# Patient Record
Sex: Female | Born: 1950
Health system: Southern US, Community
[De-identification: ages and names within clinical notes are randomized; demographics above are authoritative.]

## PROBLEM LIST (undated history)

## (undated) DIAGNOSIS — R51 Headache: Secondary | ICD-10-CM

## (undated) DIAGNOSIS — M1990 Unspecified osteoarthritis, unspecified site: Secondary | ICD-10-CM

## (undated) DIAGNOSIS — G4730 Sleep apnea, unspecified: Secondary | ICD-10-CM

## (undated) DIAGNOSIS — N859 Noninflammatory disorder of uterus, unspecified: Secondary | ICD-10-CM

## (undated) DIAGNOSIS — T7840XA Allergy, unspecified, initial encounter: Secondary | ICD-10-CM

## (undated) DIAGNOSIS — K219 Gastro-esophageal reflux disease without esophagitis: Secondary | ICD-10-CM

## (undated) DIAGNOSIS — K859 Acute pancreatitis without necrosis or infection, unspecified: Secondary | ICD-10-CM

## (undated) DIAGNOSIS — E538 Deficiency of other specified B group vitamins: Secondary | ICD-10-CM

## (undated) DIAGNOSIS — R9431 Abnormal electrocardiogram [ECG] [EKG]: Secondary | ICD-10-CM

## (undated) DIAGNOSIS — R519 Headache, unspecified: Secondary | ICD-10-CM

## (undated) DIAGNOSIS — D649 Anemia, unspecified: Secondary | ICD-10-CM

## (undated) DIAGNOSIS — I10 Essential (primary) hypertension: Secondary | ICD-10-CM

## (undated) DIAGNOSIS — C8590 Non-Hodgkin lymphoma, unspecified, unspecified site: Secondary | ICD-10-CM

## (undated) DIAGNOSIS — G629 Polyneuropathy, unspecified: Secondary | ICD-10-CM

## (undated) DIAGNOSIS — C801 Malignant (primary) neoplasm, unspecified: Secondary | ICD-10-CM

## (undated) DIAGNOSIS — N949 Unspecified condition associated with female genital organs and menstrual cycle: Secondary | ICD-10-CM

## (undated) DIAGNOSIS — F419 Anxiety disorder, unspecified: Secondary | ICD-10-CM

## (undated) DIAGNOSIS — E1140 Type 2 diabetes mellitus with diabetic neuropathy, unspecified: Secondary | ICD-10-CM

## (undated) DIAGNOSIS — E119 Type 2 diabetes mellitus without complications: Secondary | ICD-10-CM

## (undated) HISTORY — DX: Malignant (primary) neoplasm, unspecified: C80.1

## (undated) HISTORY — DX: Type 2 diabetes mellitus with diabetic neuropathy, unspecified: E11.40

## (undated) HISTORY — DX: Unspecified osteoarthritis, unspecified site: M19.90

## (undated) HISTORY — DX: Anxiety disorder, unspecified: F41.9

## (undated) HISTORY — DX: Allergy, unspecified, initial encounter: T78.40XA

## (undated) HISTORY — DX: Acute pancreatitis without necrosis or infection, unspecified: K85.90

## (undated) HISTORY — DX: Essential (primary) hypertension: I10

## (undated) HISTORY — PX: MEDIPORT INSERTION, DOUBLE: SHX2026

## (undated) HISTORY — PX: OTHER SURGICAL HISTORY: SHX169

## (undated) HISTORY — PX: MEDIPORT REMOVAL: SHX2028

---

## 1996-03-06 HISTORY — PX: CHOLECYSTECTOMY: SHX55

## 2013-09-29 LAB — HM COLONOSCOPY

## 2015-11-29 DIAGNOSIS — Z9221 Personal history of antineoplastic chemotherapy: Secondary | ICD-10-CM | POA: Diagnosis not present

## 2015-11-29 DIAGNOSIS — Z8572 Personal history of non-Hodgkin lymphomas: Secondary | ICD-10-CM | POA: Diagnosis not present

## 2016-01-03 DIAGNOSIS — C8338 Diffuse large B-cell lymphoma, lymph nodes of multiple sites: Secondary | ICD-10-CM | POA: Diagnosis not present

## 2016-01-03 DIAGNOSIS — Z1231 Encounter for screening mammogram for malignant neoplasm of breast: Secondary | ICD-10-CM | POA: Diagnosis not present

## 2016-02-07 DIAGNOSIS — Z452 Encounter for adjustment and management of vascular access device: Secondary | ICD-10-CM | POA: Diagnosis not present

## 2016-02-07 DIAGNOSIS — C8338 Diffuse large B-cell lymphoma, lymph nodes of multiple sites: Secondary | ICD-10-CM | POA: Diagnosis not present

## 2016-03-13 DIAGNOSIS — Z452 Encounter for adjustment and management of vascular access device: Secondary | ICD-10-CM | POA: Diagnosis not present

## 2016-03-13 DIAGNOSIS — C8338 Diffuse large B-cell lymphoma, lymph nodes of multiple sites: Secondary | ICD-10-CM | POA: Diagnosis not present

## 2016-04-27 DIAGNOSIS — R591 Generalized enlarged lymph nodes: Secondary | ICD-10-CM | POA: Diagnosis not present

## 2016-04-27 DIAGNOSIS — R911 Solitary pulmonary nodule: Secondary | ICD-10-CM | POA: Diagnosis not present

## 2016-04-27 DIAGNOSIS — C8330 Diffuse large B-cell lymphoma, unspecified site: Secondary | ICD-10-CM | POA: Diagnosis not present

## 2016-04-27 DIAGNOSIS — M899 Disorder of bone, unspecified: Secondary | ICD-10-CM | POA: Diagnosis not present

## 2016-04-30 DIAGNOSIS — C8339 Diffuse large B-cell lymphoma, extranodal and solid organ sites: Secondary | ICD-10-CM | POA: Diagnosis not present

## 2016-04-30 DIAGNOSIS — G608 Other hereditary and idiopathic neuropathies: Secondary | ICD-10-CM | POA: Diagnosis not present

## 2016-04-30 DIAGNOSIS — E1142 Type 2 diabetes mellitus with diabetic polyneuropathy: Secondary | ICD-10-CM | POA: Diagnosis not present

## 2016-04-30 DIAGNOSIS — I10 Essential (primary) hypertension: Secondary | ICD-10-CM | POA: Diagnosis not present

## 2016-05-04 DIAGNOSIS — E119 Type 2 diabetes mellitus without complications: Secondary | ICD-10-CM | POA: Diagnosis not present

## 2016-05-04 DIAGNOSIS — G629 Polyneuropathy, unspecified: Secondary | ICD-10-CM | POA: Diagnosis not present

## 2016-05-04 DIAGNOSIS — Z8572 Personal history of non-Hodgkin lymphomas: Secondary | ICD-10-CM | POA: Diagnosis not present

## 2016-06-10 DIAGNOSIS — J019 Acute sinusitis, unspecified: Secondary | ICD-10-CM | POA: Diagnosis not present

## 2016-06-15 DIAGNOSIS — Z8572 Personal history of non-Hodgkin lymphomas: Secondary | ICD-10-CM | POA: Diagnosis not present

## 2016-07-10 DIAGNOSIS — H25811 Combined forms of age-related cataract, right eye: Secondary | ICD-10-CM | POA: Diagnosis not present

## 2016-07-10 DIAGNOSIS — E119 Type 2 diabetes mellitus without complications: Secondary | ICD-10-CM | POA: Diagnosis not present

## 2016-07-10 DIAGNOSIS — H25812 Combined forms of age-related cataract, left eye: Secondary | ICD-10-CM | POA: Diagnosis not present

## 2016-07-27 DIAGNOSIS — G608 Other hereditary and idiopathic neuropathies: Secondary | ICD-10-CM | POA: Diagnosis not present

## 2016-07-27 DIAGNOSIS — E1142 Type 2 diabetes mellitus with diabetic polyneuropathy: Secondary | ICD-10-CM | POA: Diagnosis not present

## 2016-07-27 DIAGNOSIS — C8339 Diffuse large B-cell lymphoma, extranodal and solid organ sites: Secondary | ICD-10-CM | POA: Diagnosis not present

## 2016-07-27 DIAGNOSIS — I10 Essential (primary) hypertension: Secondary | ICD-10-CM | POA: Diagnosis not present

## 2016-07-27 LAB — HEMOGLOBIN A1C
HEMOGLOBIN A1C: 6
Hemoglobin A1C: 6

## 2016-07-27 LAB — BASIC METABOLIC PANEL
BUN: 7 mg/dL (ref 4–21)
BUN: 9 (ref 4–21)
Creatinine: 0.8 (ref 0.5–1.1)
Creatinine: 0.8 mg/dL (ref <=1.1)
Glucose: 120
Potassium: 4.1 (ref 3.4–5.3)
Potassium: 4.1 mmol/L (ref 3.4–5.3)
SODIUM: 139 (ref 137–147)
Sodium: 139 mmol/L (ref 137–147)

## 2016-07-27 LAB — CBC AND DIFFERENTIAL
HEMATOCRIT: 41 % (ref 36–46)
HEMOGLOBIN: 13.9 g/dL (ref 12.0–16.0)
PLATELETS: 150 10*3/uL (ref 150–399)
WBC: 6.6 10^3/mL

## 2016-07-27 LAB — HOMOCYSTEINE: HOMOCYSTEINE: 5.1

## 2016-07-27 LAB — HEPATIC FUNCTION PANEL
ALK PHOS: 100 (ref 25–125)
ALK PHOS: 100 U/L (ref 25–125)
ALT: 36 U/L — AB (ref 7–35)
ALT: 36 — AB (ref 7–35)
AST: 32 (ref 13–35)
AST: 32 U/L (ref 13–35)
BILIRUBIN, TOTAL: 1
BILIRUBIN, TOTAL: 1 mg/dL

## 2016-07-27 LAB — TSH
TSH: 1.14 (ref 0.41–5.90)
TSH: 1.14 u[IU]/mL (ref 0.41–5.90)

## 2016-07-27 LAB — T4, FREE: Free T4: 0.92

## 2016-07-27 LAB — VITAMIN D 25 HYDROXY (VIT D DEFICIENCY, FRACTURES): Vitamin D 1, 25 (OH)2 Total: 62

## 2016-07-27 LAB — VITAMIN B12
VITAMIN B 12: 798
VITAMIN B12: 798

## 2016-07-27 LAB — FOLATE: Folate: 14.6

## 2016-08-05 DIAGNOSIS — C8339 Diffuse large B-cell lymphoma, extranodal and solid organ sites: Secondary | ICD-10-CM | POA: Diagnosis not present

## 2016-08-05 DIAGNOSIS — I10 Essential (primary) hypertension: Secondary | ICD-10-CM | POA: Diagnosis not present

## 2016-08-05 DIAGNOSIS — E1142 Type 2 diabetes mellitus with diabetic polyneuropathy: Secondary | ICD-10-CM | POA: Diagnosis not present

## 2016-08-05 DIAGNOSIS — G608 Other hereditary and idiopathic neuropathies: Secondary | ICD-10-CM | POA: Diagnosis not present

## 2016-08-28 DIAGNOSIS — C8330 Diffuse large B-cell lymphoma, unspecified site: Secondary | ICD-10-CM | POA: Diagnosis not present

## 2016-08-28 LAB — HEPATIC FUNCTION PANEL
ALT: 54 U/L — AB (ref 7–35)
AST: 44 U/L — AB (ref 13–35)
Alkaline Phosphatase: 92 U/L (ref 25–125)
Bilirubin, Total: 0.8 mg/dL

## 2016-08-28 LAB — BASIC METABOLIC PANEL
BUN: 11 mg/dL (ref 4–21)
CREATININE: 0.7 mg/dL (ref <=1.1)
GLUCOSE: 119 mg/dL
POTASSIUM: 4.6 mmol/L (ref 3.4–5.3)
Sodium: 143 mmol/L (ref 137–147)

## 2016-08-28 LAB — CBC AND DIFFERENTIAL
HCT: 39 % (ref 36–46)
Hemoglobin: 13.8 g/dL (ref 12.0–16.0)
Neutrophils Absolute: 3 /uL
Platelets: 130 10*3/uL — AB (ref 150–399)
WBC: 6 10^3/mL

## 2016-09-02 DIAGNOSIS — E119 Type 2 diabetes mellitus without complications: Secondary | ICD-10-CM | POA: Diagnosis not present

## 2016-09-02 DIAGNOSIS — C8510 Unspecified B-cell lymphoma, unspecified site: Secondary | ICD-10-CM | POA: Diagnosis not present

## 2016-09-02 DIAGNOSIS — Z794 Long term (current) use of insulin: Secondary | ICD-10-CM | POA: Diagnosis not present

## 2016-09-02 DIAGNOSIS — Z8572 Personal history of non-Hodgkin lymphomas: Secondary | ICD-10-CM | POA: Diagnosis not present

## 2016-09-02 DIAGNOSIS — G629 Polyneuropathy, unspecified: Secondary | ICD-10-CM | POA: Diagnosis not present

## 2016-09-02 DIAGNOSIS — C8330 Diffuse large B-cell lymphoma, unspecified site: Secondary | ICD-10-CM | POA: Diagnosis not present

## 2016-11-24 ENCOUNTER — Ambulatory Visit (INDEPENDENT_AMBULATORY_CARE_PROVIDER_SITE_OTHER): Payer: Medicare Other | Admitting: Family Medicine

## 2016-11-24 ENCOUNTER — Encounter: Payer: Self-pay | Admitting: Family Medicine

## 2016-11-24 VITALS — BP 122/77 | HR 67 | Ht 67.0 in | Wt 224.9 lb

## 2016-11-24 DIAGNOSIS — K861 Other chronic pancreatitis: Secondary | ICD-10-CM

## 2016-11-24 DIAGNOSIS — E0821 Diabetes mellitus due to underlying condition with diabetic nephropathy: Secondary | ICD-10-CM | POA: Diagnosis not present

## 2016-11-24 DIAGNOSIS — Z794 Long term (current) use of insulin: Secondary | ICD-10-CM

## 2016-11-24 DIAGNOSIS — I10 Essential (primary) hypertension: Secondary | ICD-10-CM

## 2016-11-24 DIAGNOSIS — Z23 Encounter for immunization: Secondary | ICD-10-CM

## 2016-11-24 DIAGNOSIS — C801 Malignant (primary) neoplasm, unspecified: Secondary | ICD-10-CM

## 2016-11-24 DIAGNOSIS — E669 Obesity, unspecified: Secondary | ICD-10-CM

## 2016-11-24 DIAGNOSIS — E531 Pyridoxine deficiency: Secondary | ICD-10-CM | POA: Insufficient documentation

## 2016-11-24 DIAGNOSIS — K219 Gastro-esophageal reflux disease without esophagitis: Secondary | ICD-10-CM | POA: Diagnosis not present

## 2016-11-24 DIAGNOSIS — E1149 Type 2 diabetes mellitus with other diabetic neurological complication: Secondary | ICD-10-CM | POA: Diagnosis not present

## 2016-11-24 DIAGNOSIS — E119 Type 2 diabetes mellitus without complications: Secondary | ICD-10-CM

## 2016-11-24 DIAGNOSIS — E1159 Type 2 diabetes mellitus with other circulatory complications: Secondary | ICD-10-CM | POA: Insufficient documentation

## 2016-11-24 DIAGNOSIS — E538 Deficiency of other specified B group vitamins: Secondary | ICD-10-CM | POA: Insufficient documentation

## 2016-11-24 DIAGNOSIS — E781 Pure hyperglyceridemia: Secondary | ICD-10-CM | POA: Diagnosis not present

## 2016-11-24 DIAGNOSIS — Z87891 Personal history of nicotine dependence: Secondary | ICD-10-CM | POA: Diagnosis not present

## 2016-11-24 DIAGNOSIS — IMO0001 Reserved for inherently not codable concepts without codable children: Secondary | ICD-10-CM

## 2016-11-24 DIAGNOSIS — E559 Vitamin D deficiency, unspecified: Secondary | ICD-10-CM | POA: Diagnosis not present

## 2016-11-24 DIAGNOSIS — E1142 Type 2 diabetes mellitus with diabetic polyneuropathy: Secondary | ICD-10-CM

## 2016-11-24 DIAGNOSIS — E785 Hyperlipidemia, unspecified: Secondary | ICD-10-CM

## 2016-11-24 DIAGNOSIS — I152 Hypertension secondary to endocrine disorders: Secondary | ICD-10-CM | POA: Insufficient documentation

## 2016-11-24 DIAGNOSIS — E1169 Type 2 diabetes mellitus with other specified complication: Secondary | ICD-10-CM | POA: Insufficient documentation

## 2016-11-24 DIAGNOSIS — E1140 Type 2 diabetes mellitus with diabetic neuropathy, unspecified: Secondary | ICD-10-CM | POA: Insufficient documentation

## 2016-11-24 LAB — POCT GLYCOSYLATED HEMOGLOBIN (HGB A1C): Hemoglobin A1C: 6.1

## 2016-11-24 NOTE — Assessment & Plan Note (Addendum)
Since 2008----> pancreatic attack.   No Endo doc. [place] in [place] her  ----> A1c 6.1 today; lantus 70 q am and lispro humalog- 24units meals- once daily  - PCP did not put her on chol meds b/c was always WNL's.

## 2016-11-24 NOTE — Assessment & Plan Note (Signed)
Sees oncologists in [place] here prn

## 2016-11-24 NOTE — Assessment & Plan Note (Signed)
4-5 yr pack hx.

## 2016-11-24 NOTE — Assessment & Plan Note (Addendum)
B/l lext and b/l U Ext.   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

## 2016-11-24 NOTE — Assessment & Plan Note (Signed)
Last flare May 2016; started Aug 20008---> Unknown etiology

## 2016-11-24 NOTE — Progress Notes (Signed)
New patient office visit note:  Impression and Recommendations:    1. Diabetes mellitus due to underlying condition with diabetic nephropathy, with long-term current use of insulin (Jasmine Davila)   2. Need for prophylactic vaccination and inoculation against influenza   3. Smoking hx   4. Other chronic pancreatitis (Jasmine Davila)   5. Cancer (Jasmine Davila)- B- cell lymphoma (spleen and lung)    6. Diabetes mellitus without complication (Jasmine Davila)   7. B12 deficiency   8. Class 2 obesity with serious comorbidity in adult, unspecified BMI, unspecified obesity type   9. Diabetic polyneuropathy associated with type 2 diabetes mellitus (Jasmine Davila)   10. Essential hypertension      Orders Placed This Encounter  Procedures  . Flu Vaccine QUAD 36+ mos IM  . CBC with Differential/Platelet  . COMPLETE METABOLIC PANEL WITH GFR  . Amylase  . Lipase  . Lipid panel  . Magnesium  . Phosphorus  . T4, free  . TSH  . Vitamin B12  . VITAMIN D 25 Hydroxy (Vit-D Deficiency, Fractures)  . POCT glycosylated hemoglobin (Hb A1C)    Smoking hx 4-5 yr pack hx.   Cancer (Jasmine Davila)- B- cell lymphoma (spleen and lung)  Sees oncologists in [place] here prn   Diabetic neuropathy - exaccerbated by chemo B/l lext and b/l U Ext.   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    Chronic pancreatitis [facility]) Last flare May 2016; started Aug 20008---> Unknown etiology   Diabetes mellitus without complication (Jasmine Davila) Since 2008----> pancreatic attack.   No Endo doc. [place]CP in [place] her  ----> A1c 6.1 today; lantus 70 q am and lispro humalog- 24units meals- once daily  - PCP did not put her on chol meds b/c was always WNL's.     B12 deficiency Once monthly shots given by husband   Diabetes mellitus due to underlying condition with diabetic nephropathy, with long-term current use of insulin (Jasmine Davila) Onset since 2008----> after pancreatitis attack.   No Endo doc.  [place] in [place]  ---->  A1c 6.1 today; Cont current meds:  lantus 70 q am and lispro humalog- 24units meals- once daily and prn  - PCP did not put her on chol meds "b/c it was always normal".      Orders Placed This Encounter  Procedures  . Flu Vaccine QUAD 36+ mos IM  . CBC with Differential/Platelet  . COMPLETE METABOLIC PANEL WITH GFR  . Amylase  . Lipase  . Lipid panel  . Magnesium  . Phosphorus  . T4, free  . TSH  . Vitamin B12  . VITAMIN D 25 Hydroxy (Vit-D Deficiency, Fractures)  . POCT glycosylated hemoglobin (Hb A1C)    Patient's Medications  New Prescriptions   No medications on file  Previous Medications   CYANOCOBALAMIN 1000 MCG/ML KIT    Inject 1,000 mcg as directed every 30 (thirty) days.   FAMOTIDINE (PEPCID) 20 MG TABLET    Take 20 mg by mouth 2 (two) times daily.   INSULIN GLARGINE (LANTUS) 100 UNIT/ML INJECTION    Inject 70 Units into the skin every morning.   INSULIN LISPRO (HUMALOG KWIKPEN) 100 UNIT/ML KIWKPEN    Inject 24 Units into the skin. Daily with largest meal   LOSARTAN (COZAAR) 25 MG TABLET    Take 25 mg by mouth daily.   MULTIPLE VITAMIN (MULTIVITAMIN) TABLET    Take 1 tablet by mouth daily.   PYRIDOXINE (B6 NATURAL) 100 MG  TABLET    Take 200 mg by mouth daily.  Modified Medications   No medications on file  Discontinued Medications   No medications on file    Return for near futrue to review chronic issues/ neuropahty. Will review labs with pt and also, address additional concerns  The patient was counseled, risk factors were discussed, anticipatory guidance given.  Gross side effects, risk and benefits, and alternatives of medications discussed with patient.  Patient is aware that all medications have potential side effects and we are unable to predict every side effect or drug-drug interaction that may occur.  Expresses verbal understanding and consents to current therapy plan and treatment regimen.  Please see AVS handed out to patient at the end of our visit  for further patient instructions/ counseling done pertaining to today's office visit.    Note: This document was prepared using Dragon voice recognition software and may include unintentional dictation errors.  ----------------------------------------------------------------------------------------------------------------------    Subjective:    Chief Complaint  Patient presents with  . Establish Care    HPI: Jasmine Davila is a pleasant 65 y.o. female who presents to [place] at [facility] today to review their medical history with me and establish care.   I asked the patient to review their chronic problem list with me to ensure everything was updated and accurate.     Moved from [facility]; lives part-time each location- retired.  Wants a doc each location.  [name]- [place], [place] where her PCP in [place] is   Married 87yr now.  [name]'s are husband    Retired mPress photographer   2 sons- 1 died 20+ yrs ago,  I son is 476yoand also has 2 adopted daughters.   [place], oncologist, etc- [place] of all [place] specialists even if seen once yrly.    No exercise,  rates her diet as fair and is not satisfied with her weight.     No alcohol or drug use,  not currently sexually active with husband.   Long med hx---> r/w pt under each "problem list"- see that section  -Has had diabetes ever since she had a flare of pancreatitis in 2008.    [place] it's [place] been "normal".       only 5 pack year history -which she quit in 1970   Has a history of B-cell lymphoma-non-Hodgkin's:  currently in remission.        Patient Care Team    Relationship Specialty Notifications Start End  DMellody Dance DO PCP - General Family Medicine  11/24/16   CDerwood Kaplan MD Consulting Physician Oncology  11/24/16        Wt Readings from Last 3 Encounters:  11/24/16 224 lb 14.4 oz (102 kg)   BP Readings from Last 3 Encounters:  11/24/16 122/77   Pulse Readings from Last 3 Encounters:  11/24/16 67   BMI Readings from Last 3 Encounters:  11/24/16 35.22 kg/m   Lab Results  Component Value Date   HGBA1C 6.1 11/24/2016    Patient Active Problem List   Diagnosis Date Noted  . Diabetes mellitus due to underlying condition with diabetic nephropathy, with long-term current use of insulin ([place] 11/25/2016    Priority: High  . Hypertension 11/24/2016    Priority: High  . Cancer ([place]- B- cell lymphoma (  spleen and lung)  11/24/2016    Priority: High  . Diabetic neuropathy - exaccerbated by chemo 11/24/2016    Priority: High  . h/o Hypertriglyceridemia without hypercholesterolemia 11/24/2016    Priority: High  . Class 2 obesity with serious comorbidity in adult 11/25/2016    Priority: Medium  . Smoking hx 11/24/2016    Priority: Medium  . Chronic pancreatitis ([place]) 11/24/2016    Priority: Medium  . Vitamin B6 deficiency (non anemic) 11/24/2016    Priority: Medium  . GERD (gastroesophageal reflux disease) 11/24/2016    Priority: Low  . B12 deficiency 11/24/2016    Priority: Low     Past Medical History:  Diagnosis Date  . Acute pancreatitis   . Cancer ([place])    B cell lymphoma- Non Hodgkins in remission  . Diabetic neuropathy ([place])   . Hypertension      Past Surgical History:  Procedure Laterality Date  . CHOLECYSTECTOMY    . MEDIPORT INSERTION, DOUBLE    . MEDIPORT REMOVAL       Family History  Problem Relation Age of Onset  . Cancer Mother     colon  . Heart attack Father   . Heart disease Sister   . Alcohol abuse Maternal Grandfather      History  Drug Use No    History  Alcohol Use No    History  Smoking Status  . Former Smoker  . Packs/day: 0.50  . Years: 4.00  . Types: Cigarettes  . Quit date: 12/21/1968  Smokeless Tobacco  . Never Used     Patient's Medications  New Prescriptions   No medications on file  Previous Medications   CYANOCOBALAMIN 1000 MCG/ML KIT    Inject 1,000 mcg as directed every 30 (thirty) days.   FAMOTIDINE (PEPCID) 20 MG TABLET    Take 20 mg by mouth 2 (two) times daily.   INSULIN GLARGINE (LANTUS) 100 UNIT/ML INJECTION    Inject 70 Units into the skin every morning.   INSULIN LISPRO (HUMALOG KWIKPEN) 100 UNIT/ML KIWKPEN    Inject 24 Units into the skin. Daily with largest meal   LOSARTAN (COZAAR) 25 MG TABLET    Take 25 mg by mouth daily.   MULTIPLE VITAMIN (MULTIVITAMIN) TABLET    Take 1 tablet by mouth daily.   PYRIDOXINE (B6 NATURAL) 100 MG TABLET    Take 200 mg by mouth daily.  Modified Medications   No medications on file  Discontinued Medications   No medications on file    Allergies: Dilaudid [hydromorphone hcl]; Doxycycline; and Metronidazole  Review of Systems  Constitutional: Negative.  Negative for chills, diaphoresis, fever, malaise/fatigue and weight loss.  HENT: Negative.  Negative for congestion, sore throat and tinnitus.   Eyes: Negative.  Negative for blurred vision, double vision and photophobia.  Respiratory: Negative.  Negative for cough and wheezing.   Cardiovascular: Negative.  Negative for chest pain and palpitations.  Gastrointestinal: Negative.  Negative for blood in stool, diarrhea, nausea and vomiting.  Genitourinary: Negative.  Negative for dysuria, frequency and urgency.  Musculoskeletal: Negative.  Negative for joint pain and myalgias.  Skin: Negative.  Negative for itching and rash.  Neurological: Negative.  Negative for dizziness, focal weakness, weakness and headaches.  Endo/Heme/Allergies: Negative.  Negative for environmental allergies and polydipsia. Does not bruise/bleed easily.  Psychiatric/Behavioral: Negative.  Negative for depression and memory loss. The patient is not nervous/anxious and does not have insomnia.      Objective:   Blood pressure  122/77, pulse 67, height _0  (1.702 m), weight 224 lb 14.4 oz (102 kg). Body mass index is 35.22 kg/m. General: Well Developed, well nourished, and in no acute distress.  Neuro: Alert and oriented x3, extra-ocular muscles intact, sensation grossly intact.  HEENT: Normocephalic, atraumatic, pupils equal round reactive to light, neck supple Skin: no gross suspicious lesions or rashes  Cardiac: Regular rate and rhythm, no murmurs rubs or gallops.  Respiratory: Essentially clear to auscultation bilaterally. Not using accessory muscles, speaking in full sentences.  Abdominal: Soft, not grossly distended Musculoskeletal: Ambulates w/o diff, FROM * 4 ext.  Vasc: less 2 sec cap RF, warm and pink  Psych:  No HI/SI, judgement and insight good, Euthymic mood. Full Affect.

## 2016-11-24 NOTE — Patient Instructions (Addendum)
Please give patient flu shot.  Has many specialists in [place], gastroenterologist, oncologist, etc- she will bring [place] a list in future of all her specialists even if seen only once yrly.     Please realize, EXERCISE IS MEDICINE!  -  American Heart Association [facility]) guidelines for exercise : If you are in good health, without any medical conditions, you should engage in 150 minutes of moderate intensity aerobic activity per week.  This means you should be huffing and puffing throughout your workout.   Engaging in regular exercise will improve brain function and memory, as well as improve mood, boost immune system and help with weight management.  As well as the other, more well-known effects of exercise such as decreasing blood sugar levels, decreasing blood pressure,  and decreasing bad cholesterol levels/ increasing good cholesterol levels.     -  The AHA strongly endorses consumption of a diet that contains a variety of foods from all the food categories with an emphasis on fruits and vegetables; fat-free and low-fat dairy products; cereal and grain products; legumes and nuts; and fish, poultry, and/or extra lean meats.    Excessive food intake, especially of foods high in saturated and trans fats, sugar, and salt, should be avoided.    Adequate water intake of roughly 1/2 of your weight in pounds, should equal the ounces of water per day you should drink.  So for instance, if you're 200 pounds, that would be 100 ounces of water per day.         Mediterranean Diet  Why follow it? Research shows. . Those who follow the Mediterranean diet have a reduced risk of heart disease  . The diet is associated with a reduced incidence of Parkinson's and Alzheimer's diseases . People following the diet may have longer life expectancies and lower rates of chronic diseases  . The Dietary Guidelines for Americans recommends the Mediterranean diet as an eating plan to promote health and prevent  disease  What Is the Mediterranean Diet?  . Healthy eating plan based on typical foods and recipes of Mediterranean-style cooking . The diet is primarily a plant based diet; these foods should make up a majority of meals   Starches - Plant based foods should make up a majority of meals - They are an important sources of vitamins, minerals, energy, antioxidants, and fiber - Choose whole grains, foods high in fiber and minimally processed items  - Typical grain sources include wheat, oats, barley, corn, brown rice, bulgar, farro, millet, polenta, couscous  - Various types of beans include chickpeas, lentils, fava beans, black beans, white beans   Fruits  Veggies - Large quantities of antioxidant rich fruits & veggies; 6 or more servings  - Vegetables can be eaten raw or lightly drizzled with oil and cooked  - Vegetables common to the traditional Mediterranean Diet include: artichokes, arugula, beets, broccoli, brussel sprouts, cabbage, carrots, celery, collard greens, cucumbers, eggplant, kale, leeks, lemons, lettuce, mushrooms, okra, onions, peas, peppers, potatoes, pumpkin, radishes, rutabaga, shallots, spinach, sweet potatoes, turnips, zucchini - Fruits common to the Mediterranean Diet include: apples, apricots, avocados, cherries, clementines, dates, figs, grapefruits, grapes, melons, nectarines, oranges, peaches, pears, pomegranates, strawberries, tangerines  Fats - Replace butter and margarine with healthy oils, such as olive oil, canola oil, and tahini  - Limit nuts to no more than a handful a day  - Nuts include walnuts, almonds, pecans, pistachios, pine nuts  - Limit or avoid candied, honey roasted or heavily salted  nuts - Olives are central to the Mediterranean diet - can be eaten whole or used in a variety of dishes   Meats Protein - Limiting red meat: no more than a few times a month - When eating red meat: choose lean cuts and keep the portion to the size of deck of cards - Eggs:  approx. 0 to 4 times a week  - Fish and lean poultry: at least 2 a week  - Healthy protein sources include, chicken, [place], lean beef, lamb - Increase intake of seafood such as tuna, salmon, trout, mackerel, shrimp, scallops - Avoid or limit high fat processed meats such as sausage and bacon  Dairy - Include moderate amounts of low fat dairy products  - Focus on healthy dairy such as fat free yogurt, skim milk, low or reduced fat cheese - Limit dairy products higher in fat such as whole or 2% milk, cheese, ice cream  Alcohol - Moderate amounts of red wine is ok  - No more than 5 oz daily for women (all ages) and men older than age 77  - No more than 10 oz of wine daily for men younger than 56  Other - Limit sweets and other desserts  - Use herbs and spices instead of salt to flavor foods  - Herbs and spices common to the traditional Mediterranean Diet include: basil, bay leaves, chives, cloves, cumin, fennel, garlic, lavender, marjoram, mint, oregano, parsley, pepper, rosemary, sage, savory, sumac, tarragon, thyme   It's not just a diet, it's a lifestyle:  . The Mediterranean diet includes lifestyle factors typical of those in the region  . Foods, drinks and meals are best eaten with others and savored . Daily physical activity is important for overall good health . This could be strenuous exercise like running and aerobics . This could also be more leisurely activities such as walking, housework, yard-work, or taking the stairs . Moderation is the key; a balanced and healthy diet accommodates most foods and drinks . Consider portion sizes and frequency of consumption of certain foods   Meal Ideas & Options:  . Breakfast:  o Whole wheat toast or whole wheat English muffins with peanut butter & hard boiled egg o Steel cut oats topped with apples & cinnamon and skim milk  o Fresh fruit: banana, strawberries, melon, berries, peaches  o Smoothies: strawberries, bananas, greek yogurt, peanut  butter o Low fat greek yogurt with blueberries and granola  o Egg white omelet with spinach and mushrooms o Breakfast couscous: whole wheat couscous, apricots, skim milk, cranberries  . Sandwiches:  o Hummus and grilled vegetables (peppers, zucchini, squash) on whole wheat bread   o Grilled chicken on whole wheat pita with lettuce, tomatoes, cucumbers or tzatziki  o Tuna salad on whole wheat bread: tuna salad made with greek yogurt, olives, red peppers, capers, green onions o Garlic rosemary lamb pita: lamb sauted with garlic, rosemary, salt & pepper; add lettuce, cucumber, greek yogurt to pita - flavor with lemon juice and black pepper  . Seafood:  o Mediterranean grilled salmon, seasoned with garlic, basil, parsley, lemon juice and black pepper o Shrimp, lemon, and spinach whole-grain pasta salad made with low fat greek yogurt  o Seared scallops with lemon orzo  o Seared tuna steaks seasoned salt, pepper, coriander topped with tomato mixture of olives, tomatoes, olive oil, minced garlic, parsley, green onions and cappers  . Meats:  o Herbed greek chicken salad with kalamata olives, cucumber, feta  o Red bell  peppers stuffed with spinach, bulgur, lean ground beef (or lentils) & topped with feta   o Kebabs: skewers of chicken, tomatoes, onions, zucchini, squash  o [place] burgers: made with red onions, mint, dill, lemon juice, feta cheese topped with roasted red peppers . Vegetarian o Cucumber salad: cucumbers, artichoke hearts, celery, red onion, feta cheese, tossed in olive oil & lemon juice  o Hummus and whole grain pita points with a greek salad (lettuce, tomato, feta, olives, cucumbers, red onion) o Lentil soup with celery, carrots made with vegetable broth, garlic, salt and pepper  o Tabouli salad: parsley, bulgur, mint, scallions, cucumbers, tomato, radishes, lemon juice, olive oil, salt and pepper.

## 2016-11-24 NOTE — Assessment & Plan Note (Signed)
>>  ASSESSMENT AND PLAN FOR DIABETIC NEUROPATHY - EXACCERBATED BY CHEMO WRITTEN ON 12/06/2016  1:57 PM BY OPALSKI, DEBORAH, DO  B/l lext and b/l U Ext.   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

## 2016-11-24 NOTE — Assessment & Plan Note (Signed)
Once monthly shots given by husband

## 2016-11-25 DIAGNOSIS — E0821 Diabetes mellitus due to underlying condition with diabetic nephropathy: Secondary | ICD-10-CM | POA: Insufficient documentation

## 2016-11-25 DIAGNOSIS — Z794 Long term (current) use of insulin: Secondary | ICD-10-CM | POA: Insufficient documentation

## 2016-11-25 LAB — COMPLETE METABOLIC PANEL WITH GFR
ALT: 32 U/L — ABNORMAL HIGH (ref 6–29)
AST: 29 U/L (ref 10–35)
Albumin: 4.2 g/dL (ref 3.6–5.1)
Alkaline Phosphatase: 74 U/L (ref 33–130)
BUN: 11 mg/dL (ref 7–25)
CALCIUM: 9.8 mg/dL (ref 8.6–10.4)
CO2: 29 mmol/L (ref 20–31)
Chloride: 101 mmol/L (ref 98–110)
Creat: 0.76 mg/dL (ref 0.50–0.99)
GFR, EST NON AFRICAN AMERICAN: 83 mL/min (ref >=60)
Glucose, Bld: 99 mg/dL (ref 65–99)
POTASSIUM: 4.5 mmol/L (ref 3.5–5.3)
Sodium: 139 mmol/L (ref 135–146)
Total Bilirubin: 0.6 mg/dL (ref 0.2–1.2)
Total Protein: 7.1 g/dL (ref 6.1–8.1)

## 2016-11-25 LAB — CBC WITH DIFFERENTIAL/PLATELET
BASOS ABS: 0 {cells}/uL (ref 0–200)
Basophils Relative: 0 %
EOS PCT: 3 %
Eosinophils Absolute: 147 cells/uL (ref 15–500)
HEMATOCRIT: 37.6 % (ref 35.0–45.0)
Hemoglobin: 12.5 g/dL (ref 11.7–15.5)
LYMPHS ABS: 1568 {cells}/uL (ref 850–3900)
Lymphocytes Relative: 32 %
MCH: 29.1 pg (ref 27.0–33.0)
MCHC: 33.2 g/dL (ref 32.0–36.0)
MCV: 87.6 fL (ref 80.0–100.0)
MPV: 10.1 fL (ref 7.5–12.5)
Monocytes Absolute: 294 cells/uL (ref 200–950)
Monocytes Relative: 6 %
NEUTROS ABS: 2891 {cells}/uL (ref 1500–7800)
Neutrophils Relative %: 59 %
PLATELETS: 137 10*3/uL — AB (ref 140–400)
RBC: 4.29 MIL/uL (ref 3.80–5.10)
RDW: 14.1 % (ref 11.0–15.0)
WBC: 4.9 10*3/uL (ref 3.8–10.8)

## 2016-11-25 LAB — LIPID PANEL
CHOL/HDL RATIO: 3.2 ratio (ref <5.0)
CHOLESTEROL: 158 mg/dL (ref <200)
HDL: 50 mg/dL — AB (ref >50)
LDL Cholesterol: 77 mg/dL (ref <100)
Triglycerides: 156 mg/dL — ABNORMAL HIGH (ref <150)
VLDL: 31 mg/dL — ABNORMAL HIGH (ref <30)

## 2016-11-25 LAB — MAGNESIUM: Magnesium: 1.7 mg/dL (ref 1.5–2.5)

## 2016-11-25 LAB — TSH: TSH: 1.07 m[IU]/L

## 2016-11-25 LAB — AMYLASE: Amylase: 16 U/L (ref 0–105)

## 2016-11-25 LAB — VITAMIN B12: VITAMIN B 12: 1914 pg/mL — AB (ref 200–1100)

## 2016-11-25 LAB — VITAMIN D 25 HYDROXY (VIT D DEFICIENCY, FRACTURES): VIT D 25 HYDROXY: 29 ng/mL — AB (ref 30–100)

## 2016-11-25 LAB — LIPASE: Lipase: 9 U/L (ref 7–60)

## 2016-11-25 LAB — PHOSPHORUS: PHOSPHORUS: 2.8 mg/dL (ref 2.1–4.3)

## 2016-11-25 LAB — T4, FREE: Free T4: 1 ng/dL (ref 0.8–1.8)

## 2016-12-01 NOTE — Progress Notes (Signed)
Please inform patient that there are abnormal lab values that we need discuss in person ( due to her abnormal cholesterol levels)  and devise a game plan together of how to improve them.  This should be done in the near future.   Besides her cholesterol being abnormal in needing to be discussed, tell her to decrease the amount of B12 she is taking by half. She needs to take 5000 IUs vitamin D3 daily. Recheck 4-6 months. Otherwise all other labs are essentially within normal limits.  Please have him/her make a follow-up appointment with me at their earliest convenience if they don't already have one.  Thanks, Dr Jenetta Downer

## 2016-12-06 ENCOUNTER — Encounter: Payer: Self-pay | Admitting: Family Medicine

## 2016-12-06 DIAGNOSIS — K859 Acute pancreatitis without necrosis or infection, unspecified: Secondary | ICD-10-CM | POA: Insufficient documentation

## 2016-12-06 NOTE — Assessment & Plan Note (Addendum)
Onset since 2008----> after pancreatitis attack.   No Endo doc.  [place] in [place]  ----> A1c 6.1 today; Cont current meds:  lantus 70 q am and lispro humalog- 24units meals- once daily and prn  - PCP did not put her on chol meds "b/c it was always normal".

## 2016-12-24 ENCOUNTER — Telehealth: Payer: Self-pay | Admitting: Family Medicine

## 2016-12-24 ENCOUNTER — Ambulatory Visit (INDEPENDENT_AMBULATORY_CARE_PROVIDER_SITE_OTHER): Payer: Medicare Other | Admitting: Family Medicine

## 2016-12-24 ENCOUNTER — Encounter: Payer: Self-pay | Admitting: Family Medicine

## 2016-12-24 VITALS — BP 106/64 | HR 74 | Ht 67.0 in | Wt 225.9 lb

## 2016-12-24 DIAGNOSIS — Z1239 Encounter for other screening for malignant neoplasm of breast: Secondary | ICD-10-CM

## 2016-12-24 DIAGNOSIS — Z794 Long term (current) use of insulin: Secondary | ICD-10-CM

## 2016-12-24 DIAGNOSIS — C801 Malignant (primary) neoplasm, unspecified: Secondary | ICD-10-CM | POA: Diagnosis not present

## 2016-12-24 DIAGNOSIS — E538 Deficiency of other specified B group vitamins: Secondary | ICD-10-CM | POA: Diagnosis not present

## 2016-12-24 DIAGNOSIS — E781 Pure hyperglyceridemia: Secondary | ICD-10-CM

## 2016-12-24 DIAGNOSIS — E0821 Diabetes mellitus due to underlying condition with diabetic nephropathy: Secondary | ICD-10-CM | POA: Diagnosis not present

## 2016-12-24 DIAGNOSIS — G629 Polyneuropathy, unspecified: Secondary | ICD-10-CM

## 2016-12-24 DIAGNOSIS — E1142 Type 2 diabetes mellitus with diabetic polyneuropathy: Secondary | ICD-10-CM

## 2016-12-24 MED ORDER — AMITRIPTYLINE HCL 25 MG PO TABS: 25.0000 mg | ORAL_TABLET | Freq: Every day | ORAL | 0 refills | Status: DC

## 2016-12-24 MED ORDER — GABAPENTIN 300 MG PO CAPS: 600.0000 mg | ORAL_CAPSULE | Freq: Three times a day (TID) | ORAL | 3 refills | Status: DC

## 2016-12-24 MED ORDER — GABAPENTIN 100 MG PO CAPS: 200.0000 mg | ORAL_CAPSULE | Freq: Three times a day (TID) | ORAL | 0 refills | Status: DC

## 2016-12-24 NOTE — Progress Notes (Signed)
Assessment and plan:  1. Neuropathy - peripheral due to DM and CHEMO   2. Diabetic polyneuropathy associated with type 2 diabetes mellitus ([place])   3. H/o Cancer:   B- cell lymphoma (spleen and lung) - non-Hodgkin's   4. Diabetes mellitus due to underlying condition with diabetic nephropathy, with long-term current use of insulin (HCC)   5. h/o Hypertriglyceridemia without hypercholesterolemia   6. B12 deficiency    1) start neurontin and elavil. Titrate dose up slowly of gabapentin.   Declines further studies/ eval 2) control DM well, exercise- walking QD 30-73min 5) diet and lifetsyle mod d/c pt.  Declines meds today and understands risks.   Will think about it 6) cut back PO B12 supp  Return for - f/up in 2-3 wks--> review of new meds and  labs.  Anticipatory guidance and routine counseling done re: condition, txmnt options and need for follow up. All questions of patient's were answered.   Gross side effects, risk and benefits, and alternatives of medications discussed with patient.  Patient is aware that all medications have potential side effects and we are unable to predict every sideeffect or drug-drug interaction that may occur.  Expresses verbal understanding and consents to current therapy plan and treatment regiment.  Please see AVS handed out to patient at the end of our visit for additional patient instructions/ counseling done pertaining to today's office visit.  Note: This document was prepared using Dragon voice recognition software and may include unintentional dictation errors.   ----------------------------------------------------------------------------------------------------------------------  Subjective:   CC:   Jasmine Davila is a 66 y.o. female who presents to [place] at [facility] today for review and discussion of recent bloodwork that was done.   Per recent  notes from her bldwrk done on 11/24/16:  Besides her cholesterol being abnormal ( elevated TG and VLDL), needs to decrease the amount of B12 she is taking by half.    Vit D def:  She needs to take 5000 IUs vitamin D3 daily. Recheck 4-6 months. Otherwise all other labs are essentially within normal limits.  Acute concern: - Normally can't tolerate even shoes on her feet- due to neuropathy B/l LExt numb after CHEMO and hurts all way up to knees. Sharp, stabbing, burning, numbness, painful, irritating, can't sleep due to sheets on it.  Can't wear sneakers b/c kills her. Wears Birkenstock sandals in winter and can't wera sock even b/c of hyperalgesic effects.    Way Worse since the chemo.   Feels like walking with marble sin shoes- never feels like she walking on level ground.   Sometimes she way fall forward even/ stumble b/c lack of sensory to btm of feet  ---> was going to go to Neuro up in [place]--> but moved here and now establishing.  - Tried cymbalta-- like a zombie- not sure of dose. -  relieved pain a little but couldn't fxn otherwise---so not worth it.      Wt Readings from Last 3 Encounters:  03/04/17 232 lb 3.2 oz (105.3 kg)  12/29/16 226 lb 14.4 oz (102.9 kg)  12/24/16 225 lb 14.4 oz (102.5 kg)   BP Readings from Last 3 Encounters:  03/04/17 117/73  12/29/16 123/80  12/24/16 106/64   Pulse Readings from Last 3 Encounters:  03/04/17 71  12/29/16 80  12/24/16 74   BMI Readings from Last 3 Encounters:  03/04/17 36.37 kg/m  12/29/16 35.54 kg/m  12/24/16 35.38 kg/m  Patient Care Team    Relationship Specialty Notifications Start End  [name], DO PCP - General Family Medicine  11/24/16   [name], MD Consulting Physician Oncology  11/24/16     Full medical history updated and reviewed in the office today  Patient Active Problem List   Diagnosis Date Noted  . Diabetes mellitus due to underlying condition with diabetic nephropathy, with long-term  current use of insulin ([place]) 11/25/2016    Priority: High  . Hypertension 11/24/2016    Priority: High  . H/o Cancer:   B- cell lymphoma (spleen and lung) - non-Hodgkin's 11/24/2016    Priority: High  . Diabetic neuropathy - exaccerbated by chemo 11/24/2016    Priority: High  . h/o Hypertriglyceridemia without hypercholesterolemia 11/24/2016    Priority: High  . Class 2 obesity with serious comorbidity in adult 11/25/2016    Priority: Medium  . Smoking hx 11/24/2016    Priority: Medium  . Chronic pancreatitis ([place]) 11/24/2016    Priority: Medium  . Vitamin B6 deficiency (non anemic) 11/24/2016    Priority: Medium  . GERD (gastroesophageal reflux disease) 11/24/2016    Priority: Low  . B12 deficiency 11/24/2016    Priority: Low  . Sleep apnea with CPAP 12/29/2016  . History of decreased platelet count 12/29/2016    Past Medical History:  Diagnosis Date  . Acute pancreatitis   . Cancer ([place])    B cell lymphoma- Non Hodgkins in remission  . Diabetic neuropathy ([place])   . Hypertension     Past Surgical History:  Procedure Laterality Date  . CHOLECYSTECTOMY    . MEDIPORT INSERTION, DOUBLE    . MEDIPORT REMOVAL      Social History  Substance Use Topics  . Smoking status: Former Smoker    Packs/day: 0.50    Years: 4.00    Types: Cigarettes    Quit date: 12/21/1968  . Smokeless tobacco: Never Used  . Alcohol use No    Family Hx: Family History  Problem Relation Age of Onset  . Cancer Mother     colon  . Heart attack Father   . Heart disease Sister   . Alcohol abuse Maternal Grandfather      Medications: Current Outpatient Prescriptions  Medication Sig Dispense Refill  . Cholecalciferol (VITAMIN D-3) 5000 units TABS Take 1 tablet by mouth daily.    . famotidine (PEPCID) 20 MG tablet Take 20 mg by mouth 2 (two) times daily.    . insulin glargine (LANTUS) 100 UNIT/ML injection Inject 70 Units into the skin every morning.    . insulin lispro (HUMALOG KWIKPEN)  100 UNIT/ML KiwkPen Inject 24 Units into the skin. Daily with largest meal    . losartan (COZAAR) 25 MG tablet Take 25 mg by mouth daily.    . Multiple Vitamin (MULTIVITAMIN) tablet Take 1 tablet by mouth daily.    Marland Kitchen pyridoxine (B6 NATURAL) 100 MG tablet Take 200 mg by mouth daily.    Marland Kitchen aspirin EC 81 MG tablet Take 81 mg by mouth daily.    . cyanocobalamin (,VITAMIN B-12,) 1000 MCG/ML injection Inject 0.5 mLs (500 mcg total) into the muscle every 30 (thirty) days. 10 mL 1  . gabapentin (NEURONTIN) 300 MG capsule Take 3 capsules (900 mg total) by mouth 3 (three) times daily. 180 capsule 3  . niacin (NIASPAN) 1000 MG CR tablet Take 1 tablet (1,000 mg total) by mouth at bedtime. An hour after 81 mg ASA 90 tablet 3  No current facility-administered medications for this visit.     Allergies:  Allergies  Allergen Reactions  . Dilaudid [Hydromorphone Hcl] Other (See Comments)    anxious  . Doxycycline Other (See Comments)    Pancreatic issues  . Metronidazole Other (See Comments)    Pt doesn't recall     ROS: Review of Systems  Constitutional: Negative for chills and fever.  HENT: Negative for congestion and sinus pain.   Eyes: Negative for blurred vision and double vision.  Respiratory: Negative for shortness of breath and wheezing.   Cardiovascular: Negative for chest pain and palpitations.  Gastrointestinal: Negative for constipation, diarrhea, heartburn, nausea and vomiting.  Musculoskeletal: Positive for back pain, joint pain, myalgias and neck pain. Negative for falls.       Chronic pains  Skin: Negative for rash.  Neurological: Negative for dizziness and focal weakness.  Endo/Heme/Allergies: Negative for polydipsia.  Psychiatric/Behavioral: Negative for depression and suicidal ideas. The patient is not nervous/anxious and does not have insomnia.     Objective:  Blood pressure 106/64, pulse 74, height 5\' 7"  (1.702 m), weight 225 lb 14.4 oz (102.5 kg). Body mass index is  35.38 kg/m. Gen:   Well NAD, A and O *3 HEENT:    [place]/AT, EOMI,  MMM Lungs:   Normal work of breathing. CTA B/L, no Wh, rhonchi Heart:   RRR, S1, S2 WNL's, no MRG Abd:   No gross distention Exts:    warm, pink,  Brisk capillary refill, warm and well perfused.  Psych:    No HI/SI, judgement and insight good, Euthymic mood. Full Affect.   Recent Results (from the past 2160 hour(s))  POCT glycosylated hemoglobin (Hb A1C)     Status: Abnormal   Collection Time: 03/04/17  2:41 PM  Result Value Ref Range   Hemoglobin A1C 6.5   POCT UA - Microalbumin     Status: Normal   Collection Time: 03/04/17  2:41 PM  Result Value Ref Range   Microalbumin Ur, POC 10 mg/L   Creatinine, POC 50 mg/dL   Albumin/Creatinine Ratio, Urine, POC <30

## 2016-12-24 NOTE — Patient Instructions (Addendum)
Take 100mg  nigthly for 3 days,  then 100mg  twice daily for 3 days Take 100 mg three times daily for 3 days Then take 1, 1, 2 in pm * 3 days Then 1, 2, 2 * 3 days Then take 2, 2, 2 * 3 days-- works your way slowly up to 3 tabs three times daily  --> then slowly titrate the 300mg  neurontin up as well.     Peripheral Neuropathy Peripheral neuropathy is a type of nerve damage. It affects nerves that carry signals between the spinal cord and other parts of the body. These are called peripheral nerves. With peripheral neuropathy, one nerve or a group of nerves may be damaged. What are the causes? Many things can damage peripheral nerves. For some people with peripheral neuropathy, the cause is unknown. Some causes include:  Diabetes. This is the most common cause of peripheral neuropathy.  Injury to a nerve.  Pressure or stress on a nerve that lasts a long time.  Too little vitamin B. Alcoholism can lead to this.  Infections.  Autoimmune diseases, such as multiple sclerosis and systemic lupus erythematosus.  Inherited nerve diseases.  Some medicines, such as cancer drugs.  Toxic substances, such as lead and mercury.  Too little blood flowing to the legs.  Kidney disease.  Thyroid disease. What are the signs or symptoms? Different people have different symptoms. The symptoms you have will depend on which of your nerves is damaged. Common symptoms include:  Loss of feeling (numbness) in the feet and hands.  Tingling in the feet and hands.  Pain that burns.  Very sensitive skin.  Weakness.  Not being able to move a part of the body (paralysis).  Muscle twitching.  Clumsiness or poor coordination.  Loss of balance.  Not being able to control your bladder.  Feeling dizzy.  Sexual problems. How is this diagnosed? Peripheral neuropathy is a symptom, not a disease. Finding the cause of peripheral neuropathy can be hard. To figure that out, your health care provider  will take a medical history and do a physical exam. A neurological exam will also be done. This involves checking things affected by your brain, spinal cord, and nerves (nervous system). For example, your health care provider will check your reflexes, how you move, and what you can feel. Other types of tests may also be ordered, such as:  Blood tests.  A test of the fluid in your spinal cord.  Imaging tests, such as CT scans or an MRI.  Electromyography (EMG). This test checks the nerves that control muscles.  Nerve conduction velocity tests. These tests check how fast messages pass through your nerves.  Nerve biopsy. A small piece of nerve is removed. It is then checked under a microscope. How is this treated?  Medicine is often used to treat peripheral neuropathy. Medicines may include:  Pain-relieving medicines. Prescription or over-the-counter medicine may be suggested.  Antiseizure medicine. This may be used for pain.  Antidepressants. These also may help ease pain from neuropathy.  Lidocaine. This is a numbing medicine. You might wear a patch or be given a shot.  Mexiletine. This medicine is typically used to help control irregular heart rhythms.  Surgery. Surgery may be needed to relieve pressure on a nerve or to destroy a nerve that is causing pain.  Physical therapy to help movement.  Assistive devices to help movement. Follow these instructions at home:  Only take over-the-counter or prescription medicines as directed by your health care provider. Follow  the instructions carefully for any given medicines. Do not take any other medicines without first getting approval from your health care provider.  If you have diabetes, work closely with your health care provider to keep your blood sugar under control.  If you have numbness in your feet:  Check every day for signs of injury or infection. Watch for redness, warmth, and swelling.  Wear padded socks and comfortable  shoes. These help protect your feet.  Do not do things that put pressure on your damaged nerve.  Do not smoke. Smoking keeps blood from getting to damaged nerves.  Avoid or limit alcohol. Too much alcohol can cause a lack of B vitamins. These vitamins are needed for healthy nerves.  Develop a good support system. Coping with peripheral neuropathy can be stressful. Talk to a mental health specialist or join a support group if you are struggling.  Follow up with your health care provider as directed. Contact a health care provider if:  You have new signs or symptoms of peripheral neuropathy.  You are struggling emotionally from dealing with peripheral neuropathy.  You have a fever. Get help right away if:  You have an injury or infection that is not healing.  You feel very dizzy or begin vomiting.  You have chest pain.  You have trouble breathing. This information is not intended to replace advice given to you by your health care provider. Make sure you discuss any questions you have with your health care provider. Document Released: 11/27/2002 Document Revised: 05/14/2016 Document Reviewed: 08/14/2013 Elsevier Interactive Patient Education  2017 [employer].

## 2016-12-24 NOTE — Telephone Encounter (Signed)
Patient was told she needs to get a mammo and would like to go to [facility]. She needs a signed order sent over to them (f) [phone] (I can fax it, just put the number here so I have it saved)

## 2016-12-24 NOTE — Addendum Note (Signed)
Addended by: Fonnie Mu on: 12/24/2016 02:03 PM   Modules accepted: Orders

## 2016-12-29 ENCOUNTER — Encounter: Payer: Self-pay | Admitting: Family Medicine

## 2016-12-29 ENCOUNTER — Ambulatory Visit (INDEPENDENT_AMBULATORY_CARE_PROVIDER_SITE_OTHER): Payer: Medicare Other | Admitting: Family Medicine

## 2016-12-29 VITALS — BP 123/80 | HR 80 | Ht 67.0 in | Wt 226.9 lb

## 2016-12-29 DIAGNOSIS — E781 Pure hyperglyceridemia: Secondary | ICD-10-CM

## 2016-12-29 DIAGNOSIS — E1142 Type 2 diabetes mellitus with diabetic polyneuropathy: Secondary | ICD-10-CM | POA: Diagnosis not present

## 2016-12-29 DIAGNOSIS — E538 Deficiency of other specified B group vitamins: Secondary | ICD-10-CM | POA: Diagnosis not present

## 2016-12-29 DIAGNOSIS — Z794 Long term (current) use of insulin: Secondary | ICD-10-CM | POA: Diagnosis not present

## 2016-12-29 DIAGNOSIS — K219 Gastro-esophageal reflux disease without esophagitis: Secondary | ICD-10-CM | POA: Diagnosis not present

## 2016-12-29 DIAGNOSIS — Z862 Personal history of diseases of the blood and blood-forming organs and certain disorders involving the immune mechanism: Secondary | ICD-10-CM | POA: Insufficient documentation

## 2016-12-29 DIAGNOSIS — I10 Essential (primary) hypertension: Secondary | ICD-10-CM

## 2016-12-29 DIAGNOSIS — E531 Pyridoxine deficiency: Secondary | ICD-10-CM

## 2016-12-29 DIAGNOSIS — G4730 Sleep apnea, unspecified: Secondary | ICD-10-CM | POA: Insufficient documentation

## 2016-12-29 DIAGNOSIS — E0821 Diabetes mellitus due to underlying condition with diabetic nephropathy: Secondary | ICD-10-CM | POA: Diagnosis not present

## 2016-12-29 DIAGNOSIS — C801 Malignant (primary) neoplasm, unspecified: Secondary | ICD-10-CM

## 2016-12-29 MED ORDER — NIACIN ER (ANTIHYPERLIPIDEMIC) 1000 MG PO TBCR: 1000.0000 mg | EXTENDED_RELEASE_TABLET | Freq: Every day | ORAL | 3 refills | Status: DC

## 2016-12-29 MED ORDER — CYANOCOBALAMIN 1000 MCG/ML IJ SOLN: 500.0000 ug | INTRAMUSCULAR | 1 refills | Status: DC

## 2016-12-29 NOTE — Progress Notes (Signed)
Impression and Recommendations:    1. h/o Hypertriglyceridemia without hypercholesterolemia   2. Sleep apnea, unspecified type   3. History of decreased platelet count   4. B12 deficiency   5. Diabetes mellitus due to underlying condition with diabetic nephropathy, with long-term current use of insulin ([place])   6. Diabetic polyneuropathy associated with type 2 diabetes mellitus ([place])   7. H/o Cancer:   B- cell lymphoma (spleen and lung) - non-Hodgkin's   8. Vitamin B6 deficiency (non anemic)   9. Gastroesophageal reflux disease, esophagitis presence not specified   10. Essential hypertension    - start niaspan; LFT 6-8wks - cont to titrate up gabapentin to attain sx control - wt loss d/c pt; counseling done   History of decreased platelet count Patient will get me her labs and medical records from her last doctors in [place]. We will just see and compare her platelet count of 137 now to her last ones.  Diabetes mellitus due to underlying condition with diabetic nephropathy, with long-term current use of insulin (HCC) a1c was 6.1--> reck 3 mo  -> Pt was in the office today for 40+ minutes, with over 50% time spent in face to face counseling of various medical concerns and in coordination of care   New Prescriptions   CYANOCOBALAMIN (,VITAMIN B-12,) 1000 MCG/ML INJECTION    Inject 0.5 mLs (500 mcg total) into the muscle every 30 (thirty) days.   GABAPENTIN (NEURONTIN) 300 MG CAPSULE    Take 3 capsules (900 mg total) by mouth 3 (three) times daily.   NIACIN (NIASPAN) 1000 MG CR TABLET    Take 1 tablet (1,000 mg total) by mouth at bedtime. An hour after 81 mg ASA    Modified Medications   No medications on file    Discontinued Medications   AMITRIPTYLINE (ELAVIL) 25 MG TABLET    Take 1-2 tablets (25-50 mg total) by mouth at bedtime.   CYANOCOBALAMIN 1000 MCG/ML KIT    Inject 1,000 mcg as directed every 30 (thirty) days.   GABAPENTIN (NEURONTIN) 100 MG CAPSULE    Take 2  capsules (200 mg total) by mouth 3 (three) times daily.   GABAPENTIN (NEURONTIN) 300 MG CAPSULE    Take 2 capsules (600 mg total) by mouth 3 (three) times daily.    The patient was counseled, risk factors were discussed, anticipatory guidance given.  Gross side effects, risk and benefits, and alternatives of medications and treatment plan in general discussed with patient.  Patient is aware that all medications have potential side effects and we are unable to predict every side effect or drug-drug interaction that may occur.   Patient will call with any questions prior to using medication if they have concerns.  Expresses verbal understanding and consents to current therapy and treatment regimen.  No barriers to understanding were identified.  Red flag symptoms and signs discussed in detail.  Patient expressed understanding regarding what to do in case of emergency\urgent symptoms  Return in about 3 months (around 03/29/2017) for Follow-up of current medical issues.  Please see AVS handed out to patient at the end of our visit for further patient instructions/ counseling done pertaining to today's office visit.    Note: This document was prepared using Dragon voice recognition software and may include unintentional dictation errors.   --------------------------------------------------------------------------------------------------------------------------------------------------------------------------------------------------------------------------------------------    Subjective:    CC:  Chief Complaint  Patient presents with  . Follow-up    Review Labs  HPI: Jasmine Davila is a 66 y.o. female who presents to [place] [facility] today - started elavil and neurontin last ov.    - Tolerating Neurontin ok- NO S-E.   Pt is on day 5 only of the meds.  Taking 2, 172m's per day.    Minimal improvement of her peripheral neuropathy sx---> legs aren't as heavy  and feet don't hurt as much though when pressed.   No exercise yet;   little change in diet since d/c pt high TG.  Will consider taking meds today.    Wt Readings from Last 3 Encounters:  03/04/17 232 lb 3.2 oz (105.3 kg)  12/29/16 226 lb 14.4 oz (102.9 kg)  12/24/16 225 lb 14.4 oz (102.5 kg)   BP Readings from Last 3 Encounters:  03/04/17 117/73  12/29/16 123/80  12/24/16 106/64   Pulse Readings from Last 3 Encounters:  03/04/17 71  12/29/16 80  12/24/16 74   BMI Readings from Last 3 Encounters:  03/04/17 36.37 kg/m  12/29/16 35.54 kg/m  12/24/16 35.38 kg/m     Patient Care Team    Relationship Specialty Notifications Start End  DMellody Dance DO PCP - General Family Medicine  11/24/16   CDerwood Kaplan MD Consulting Physician Oncology  11/24/16     Patient Active Problem List   Diagnosis Date Noted  . Diabetes mellitus due to underlying condition with diabetic nephropathy, with long-term current use of insulin ([place] 11/25/2016    Priority: High  . Hypertension 11/24/2016    Priority: High  . H/o Cancer:   B- cell lymphoma (spleen and lung) - non-Hodgkin's 11/24/2016    Priority: High  . Diabetic neuropathy - exaccerbated by chemo 11/24/2016    Priority: High  . h/o Hypertriglyceridemia without hypercholesterolemia 11/24/2016    Priority: High  . Class 2 obesity with serious comorbidity in adult 11/25/2016    Priority: Medium  . Smoking hx 11/24/2016    Priority: Medium  . Chronic pancreatitis ([place] 11/24/2016    Priority: Medium  . Vitamin B6 deficiency (non anemic) 11/24/2016    Priority: Medium  . GERD (gastroesophageal reflux disease) 11/24/2016    Priority: Low  . B12 deficiency 11/24/2016    Priority: Low  . Sleep apnea with CPAP 12/29/2016  . History of decreased platelet count 12/29/2016    Past Medical history, Surgical history, Family history, Social history, Allergies and Medications have been entered into the medical record, reviewed  and changed as needed.   Allergies:  Allergies  Allergen Reactions  . Dilaudid [Hydromorphone Hcl] Other (See Comments)    anxious  . Doxycycline Other (See Comments)    Pancreatic issues  . Metronidazole Other (See Comments)    Pt doesn't recall    Review of Systems  Constitutional: Negative for chills and fever.  HENT: Negative for congestion and sinus pain.   Eyes: Negative for blurred vision and double vision.  Respiratory: Negative for shortness of breath and wheezing.   Cardiovascular: Negative for chest pain, palpitations and orthopnea.  Gastrointestinal: Negative for constipation, diarrhea, heartburn, nausea and vomiting.  Genitourinary: Negative for frequency and hematuria.  Musculoskeletal: Positive for joint pain. Negative for falls.       Chronic jt pains  Skin: Negative for rash.  Neurological: Negative for dizziness, sensory change and focal weakness.  Endo/Heme/Allergies: Positive for polydipsia.  Psychiatric/Behavioral: Negative for depression and suicidal ideas. The patient is not nervous/anxious and does not have insomnia.  Objective:   Blood pressure 123/80, pulse 80, height _0  (1.702 m), weight 226 lb 14.4 oz (102.9 kg). Body mass index is 35.54 kg/m. General: Well Developed, well nourished, appropriate for stated age.  Neuro: Alert and oriented x3, extra-ocular muscles intact, sensation grossly intact.  HEENT: Normocephalic, atraumatic, neck supple, no carotid bruits appreciated  Skin: no gross rash. Cardiac: RRR, S1 S2 Respiratory: ECTA B/L, Not using accessory muscles, speaking in full sentences-unlabored. Vascular:  Ext warm, dry, pink; cap RF less 2 sec. Psych: No HI/SI, judgement and insight good, Euthymic mood. Full Affect.  Recent Results (from the past 2160 hour(s))  POCT glycosylated hemoglobin (Hb A1C)     Status: Abnormal   Collection Time: 03/04/17  2:41 PM  Result Value Ref Range   Hemoglobin A1C 6.5   POCT UA - Microalbumin      Status: Normal   Collection Time: 03/04/17  2:41 PM  Result Value Ref Range   Microalbumin Ur, POC 10 mg/L   Creatinine, POC 50 mg/dL   Albumin/Creatinine Ratio, Urine, POC <30

## 2016-12-29 NOTE — Patient Instructions (Addendum)
Please drop by your lab records from your doctors in [place] at your earliest convenience.   for your niacin, please cut 3 tablets in half and only take a half a tablet nightly for 6 nights, if well tolerated, go to 1 tablet nightly as per your prescription  If you are having pain and difficulty sleeping, go ahead and take your amitriptyline/ elavil in addition to your Neurontin   Guidelines for a Low Cholesterol, Low Saturated Fat Diet   Fats - Limit total intake of fats and oils. - Avoid butter, stick margarine, shortening, lard, palm and coconut oils. - Limit mayonnaise, salad dressings, gravies and sauces, unless they are homemade with low-fat ingredients. - Limit chocolate. - Choose low-fat and nonfat products, such as low-fat mayonnaise, low-fat or non-hydrogenated peanut butter, low-fat or fat-free salad dressings and nonfat gravy. - Use vegetable oil, such as canola or olive oil. - Look for margarine that does not contain trans fatty acids. - Use nuts in moderate amounts. - Read ingredient labels carefully to determine both amount and type of fat present in foods. Limit saturated and trans fats! - Avoid high-fat processed and convenience foods.  Meats and Meat Alternatives - Choose fish, chicken, [place] and lean meats. - Use dried beans, peas, lentils and tofu. - Limit egg yolks to three to four per week. - If you eat red meat, limit to no more than three servings per week and choose loin or round cuts. - Avoid fatty meats, such as bacon, sausage, franks, luncheon meats and ribs. - Avoid all organ meats, including liver.  Dairy - Choose nonfat or low-fat milk, yogurt and cottage cheese. - Most cheeses are high in fat. Choose cheeses made from non-fat milk, such as mozzarella and ricotta cheese. - Choose light or fat-free cream cheese and sour cream. - Avoid cream and sauces made with cream.  Fruits and Vegetables - Eat a wide variety of fruits and vegetables. - Use  lemon juice, vinegar or "mist" olive oil on vegetables. - Avoid adding sauces, fat or oil to vegetables.  Breads, Cereals and Grains - Choose whole-grain breads, cereals, pastas and rice. - Avoid high-fat snack foods, such as granola, cookies, pies, pastries, doughnuts and croissants.  Cooking Tips - Avoid deep fried foods. - Trim visible fat off meats and remove skin from poultry before cooking. - Bake, broil, boil, poach or roast poultry, fish and lean meats. - Drain and discard fat that drains out of meat as you cook it. - Add little or no fat to foods. - Use vegetable oil sprays to grease pans for cooking or baking. - Steam vegetables. - Use herbs or no-oil marinades to flavor foods.      The quick and dirty--> lower triglyceride levels more through...  1) - Beware of bad fats: Cutting back on saturated fat (in red meat and full-fat dairy foods) and trans fats (in restaurant fried foods and commercially prepared baked goods) can lower triglycerides.  2) - Go for good carbs: Easily digested carbohydrates (such as white bread, white rice, cornflakes, and sugary sodas) give triglycerides a definite boost.   3) - Eating whole grains and cutting back on soda can help control triglycerides.  4) - Check your alcohol use. In some people, alcohol dramatically boosts triglycerides. The only way to know if this is true for you is to avoid alcohol for a few weeks and have your triglycerides tested again.  5) - Go fish. Omega-3 fats in salmon, tuna, sardines,  and other fatty fish can lower triglycerides. Having fish twice a week is fine.  6) - Aim for a healthy weight. If you are overweight, losing just 5% to 10% of your weight can help drive down triglycerides.  7) - Get moving. Exercise lowers triglycerides and boosts heart-healthy HDL cholesterol.  8) - quit smoking if you do    For more detailed info----->   For those diagnosed with high triglycerides, it's important to take  action to lower your levels and improve your heart health.  Triglyceride is just a fancy word for fat - the fat in our bodies is stored in the form of triglycerides. Triglycerides are found in foods and manufactured in our bodies.  Normal triglyceride levels are defined as less than 150 mg/dL; 150 to 199 is considered borderline high; 200 to 499 is high; and 500 or higher is officially called very high. To me, anything over 150 is a red flag indicating my patient needs to take immediate steps to get the situation under control.   What is the significance of high triglycerides? High triglyceride levels make blood thicker and stickier, which means that it is more likely to form clots. Studies have shown that triglyceride levels are associated with increased risks of cardiovascular disease and stroke - in both men and women - alone or in combination with other risk factors (high triglycerides combined with high LDL cholesterol can be a particularly deadly combination). For example, in one ground-breaking study, high triglycerides alone increased the risk of cardiovascular disease by 14 percent in men, and by 72 percent in women. But when the test subjects also had low HDL cholesterol (that's the good cholesterol) and other risk factors, high triglycerides increased the risk of disease by 32 percent in men and 76 percent in women.   Fortunately, triglycerides can sometimes be controlled with several diet and lifestyle changes.    What Factors Can Increase Triglycerides? As with cholesterol, eating too much of the wrong kinds of fats will raise your blood triglycerides.  Therefore, it's important to restrict the amounts of saturated fats and trans fats you allow into your diet.  Triglyceride levels can also shoot up after eating foods that are high in carbohydrates or after drinking alcohol.  That's why triglyceride blood tests require an overnight fast.  If you have elevated triglycerides, it's especially  important to avoid sugary and refined carbohydrates, including sugar, honey, and other sweeteners, soda and other sugary drinks, candy, baked goods, and anything made with white (refined or enriched) flour, including white bread, rolls, cereals, buns, pastries, regular pasta, and white rice.  You'll also want to limit dried fruit and fruit juice since they're dense in simple sugar.  All of these low-quality carbs cause a sudden rise in insulin, which may lead to a spike in triglycerides.  Triglycerides can also become elevated as a reaction to having diabetes, hypothyroidism, or kidney disease. As with most other heart-related factors, being overweight and inactive also contribute to abnormal triglycerides. And unfortunately, some people have a genetic predisposition that causes them to manufacture way too much triglycerides on their own, no matter how carefully they eat.   How Can You Lower Your Triglyceride Levels? If you are diagnosed with high triglycerides, it's important to take action. There are several things you can do to help lower your triglyceride levels and improve your heart health:  Lose weight if you are overweight.  There is a clear correlation between obesity and high triglycerides - the  heavier people are, the higher their triglyceride levels are likely to be. The good news is that losing weight can significantly lower triglycerides. In a large study of individuals with type 2 diabetes, those assigned to the "lifestyle intervention group" - which involved counseling, a low-calorie meal plan, and customized exercise program - lost 8.6% of their body weight and lowered their triglyceride levels by more than 16%. If you're overweight, find a weight loss plan that works for you and commit to shedding the pounds and getting healthier.  Reduce the amount of saturated fat and trans fat in your diet.  Start by avoiding or dramatically limiting butter, cream cheese, lard, sour cream, doughnuts,  cakes, cookies, candy bars, regular ice cream, fried foods, pizza, cheese sauce, cream-based sauces and salad dressings, high-fat meats (including fatty hamburgers, bologna, pepperoni, sausage, bacon, salami, pastrami, spareribs, and hot dogs), high-fat cuts of beef and pork, and whole-milk dairy products.   Other ways to cut back: Choose lean meats only (including skinless chicken and [place], lean beef, lean pork), fish, and reduced-fat or fat-free dairy products.   Experiment with adding whole soy foods to your diet. Although soy itself may not reduce risk of heart disease, it replaces hazardous animal fats with healthier proteins. Choose high-quality soy foods, such as tofu, tempeh, soy milk, and edamame (whole soybeans).  Always remove skin from poultry.  Prepare foods by baking, roasting, broiling, boiling, poaching, steaming, grilling, or stir-frying in vegetable oil.  Most stick margarines contain trans fats, and trans fats are also found in some packaged baked goods, potato chips, snack foods, fried foods, and fast food that use or create hydrogenated oils.    (All food labels must now list the amount of trans fats, right after the amount of saturated fats - good news for consumers. As a result, many food companies have now reformulated their products to be trans fat free.many, but not all! So it's still just as important to read labels and make sure the packaged foods you buy don't contain trans fats.)     If you use margarine, purchase soft-tub margarine spreads that contain 0 grams trans fats and don't list any partially hydrogenated oils in the ingredients list. By substituting olive oil or vegetable oil for trans fats in just 2 percent of your daily calories, you can reduce your risk of heart disease by 53 percent.   There is no safe amount of trans fats, so try to keep them as far from your plate as possible.  Avoid foods that are concentrated in sugar (even dried fruit and fruit juice).  Sugary foods can elevate triglyceride levels in the blood, so keep them to a bare minimum.  Swap out refined carbohydrates for whole grains.  Refined carbohydrates - like white rice, regular pasta, and anything made with white or "enriched" flour (including white bread, rolls, cereals, buns, and crackers) - raise blood sugar and insulin levels more than fiber-rich whole grains. Higher insulin levels, in turn, can lead to a higher rise in triglycerides after a meal. So, make the switch to whole wheat bread, whole grain pasta, brown or wild rice, and whole grain versions of cereals, crackers, and other bread products. However, it's important to know that individuals with high triglycerides should moderate even their intake of high-quality starches (since all starches raise blood sugar) - I recommend 1 to 2 servings per meal.  Cut way back on alcohol.  If you have high triglycerides, alcohol should be considered a rare treat -  if you indulge at all, since even small amounts of alcohol can dramatically increase triglyceride levels.  Incorporate omega-3 fats.  Heart-healthy fish oils are especially rich in omega-3 fatty acids. In multiple studies over the past two decades, people who ate diets high in omega-3s had 30 to 40 percent reductions in heart disease. Although we don't yet know why fish oil works so well, there are several possibilities. Omega-3s seem to reduce inflammation, reduce high blood pressure, decrease triglycerides, raise HDL cholesterol, and make blood thinner and less sticky so it is less likely to clot. It's as close to a food prescription for heart health as it gets. If you have high triglycerides, I recommend eating at least three servings of one of the omega-3-rich fish every week (fatty fish is the most concentrated food form of omega three fats). If you cannot manage to eat that much fish, speak with your physician about taking fish oil capsules, which offer similar benefits.The best  foods for omega-3 fatty acids include wild salmon (fresh, canned), herring, mackerel (not king), sardines, anchovies, rainbow trout, and Pacific oysters. Non-fish sources of omega-3 fats include omega-3-fortified eggs, ground flaxseed, chia seeds, walnuts, butternuts (white walnuts), seaweed, walnut oil, canola oil, and soybeans.  Quit smoking.  Smoking causes inflammation, not just in your lungs, but throughout your body. Inflammation can contribute to atherosclerosis, blood clots, and risk of heart attack. Smoking makes all heart health indicators worse. If you have high cholesterol, high triglycerides, or high blood pressure, smoking magnifies the danger.  Become more physically active.  Even moderate exercise can help improve cholesterol, triglycerides, and blood pressure. Aerobic exercise seems to be able to stop the sharp rise of triglycerides after eating, perhaps because of a decrease in the amount of triglyceride released by the liver, or because active muscle clears triglycerides out of the blood stream more quickly than inactive muscle. If you haven't exercised regularly (or at all) for years, I recommend starting slowly, by walking at an easy pace for 15 minutes a day. Then, as you feel more comfortable, increase the amount. Your ultimate goal should be at least 30 minutes of moderate physical activity, at least five days a week.

## 2016-12-29 NOTE — Assessment & Plan Note (Signed)
a1c was 6.1--> reck 3 mo

## 2016-12-29 NOTE — Assessment & Plan Note (Signed)
Patient will get me her labs and medical records from her last doctors in [place]. We will just see and compare her platelet count of 137 now to her last ones.

## 2017-01-04 DIAGNOSIS — Z1231 Encounter for screening mammogram for malignant neoplasm of breast: Secondary | ICD-10-CM | POA: Diagnosis not present

## 2017-01-04 LAB — HM MAMMOGRAPHY

## 2017-02-12 DIAGNOSIS — J111 Influenza due to unidentified influenza virus with other respiratory manifestations: Secondary | ICD-10-CM | POA: Diagnosis not present

## 2017-02-12 DIAGNOSIS — N3090 Cystitis, unspecified without hematuria: Secondary | ICD-10-CM | POA: Diagnosis not present

## 2017-02-12 DIAGNOSIS — N3001 Acute cystitis with hematuria: Secondary | ICD-10-CM | POA: Diagnosis not present

## 2017-02-15 ENCOUNTER — Ambulatory Visit: Payer: Medicare Other | Admitting: Adult Health

## 2017-03-04 ENCOUNTER — Encounter: Payer: Self-pay | Admitting: Adult Health

## 2017-03-04 ENCOUNTER — Ambulatory Visit (INDEPENDENT_AMBULATORY_CARE_PROVIDER_SITE_OTHER): Payer: Medicare Other | Admitting: Adult Health

## 2017-03-04 ENCOUNTER — Ambulatory Visit: Payer: Medicare Other | Admitting: Family Medicine

## 2017-03-04 VITALS — BP 117/73 | HR 71 | Ht 67.0 in | Wt 232.2 lb

## 2017-03-04 DIAGNOSIS — E1142 Type 2 diabetes mellitus with diabetic polyneuropathy: Secondary | ICD-10-CM | POA: Diagnosis not present

## 2017-03-04 DIAGNOSIS — Z1159 Encounter for screening for other viral diseases: Secondary | ICD-10-CM | POA: Diagnosis not present

## 2017-03-04 DIAGNOSIS — Z794 Long term (current) use of insulin: Secondary | ICD-10-CM | POA: Diagnosis not present

## 2017-03-04 DIAGNOSIS — E0821 Diabetes mellitus due to underlying condition with diabetic nephropathy: Secondary | ICD-10-CM

## 2017-03-04 LAB — POCT UA - MICROALBUMIN
Creatinine, POC: 50 mg/dL
Microalbumin Ur, POC: 10 mg/L

## 2017-03-04 LAB — POCT GLYCOSYLATED HEMOGLOBIN (HGB A1C): HEMOGLOBIN A1C: 6.5

## 2017-03-04 MED ORDER — GABAPENTIN 300 MG PO CAPS: 900.0000 mg | ORAL_CAPSULE | Freq: Three times a day (TID) | ORAL | 3 refills | Status: DC

## 2017-03-04 NOTE — Patient Instructions (Addendum)
Carbohydrate Counting for Diabetes Mellitus, Adult Carbohydrate counting is a method for keeping track of how many carbohydrates you eat. Eating carbohydrates naturally increases the amount of sugar (glucose) in the blood. Counting how many carbohydrates you eat helps keep your blood glucose within normal limits, which helps you manage your diabetes (diabetes mellitus). It is important to know how many carbohydrates you can safely have in each meal. This is different for every person. A diet and nutrition specialist (registered dietitian) can help you make a meal plan and calculate how many carbohydrates you should have at each meal and snack. Carbohydrates are found in the following foods:  Grains, such as breads and cereals.  Dried beans and soy products.  Starchy vegetables, such as potatoes, peas, and corn.  Fruit and fruit juices.  Milk and yogurt.  Sweets and snack foods, such as cake, cookies, candy, chips, and soft drinks. How do I count carbohydrates? There are two ways to count carbohydrates in food. You can use either of the methods or a combination of both. Reading "Nutrition Facts" on packaged food  The "Nutrition Facts" list is included on the labels of almost all packaged foods and beverages in the [place] It includes:  The serving size.  Information about nutrients in each serving, including the grams (g) of carbohydrate per serving. To use the "Nutrition Facts":  Decide how many servings you will have.  Multiply the number of servings by the number of carbohydrates per serving.  The resulting number is the total amount of carbohydrates that you will be having. Learning standard serving sizes of other foods  When you eat foods containing carbohydrates that are not packaged or do not include "Nutrition Facts" on the label, you need to measure the servings in order to count the amount of carbohydrates:  Measure the foods that you will eat with a food scale or measuring  cup, if needed.  Decide how many standard-size servings you will eat.  Multiply the number of servings by 15. Most carbohydrate-rich foods have about 15 g of carbohydrates per serving.  For example, if you eat 8 oz (170 g) of strawberries, you will have eaten 2 servings and 30 g of carbohydrates (2 servings x 15 g = 30 g).  For foods that have more than one food mixed, such as soups and casseroles, you must count the carbohydrates in each food that is included. The following list contains standard serving sizes of common carbohydrate-rich foods. Each of these servings has about 15 g of carbohydrates:   hamburger bun or  English muffin.   oz (15 mL) syrup.   oz (14 g) jelly.  1 slice of bread.  1 six-inch tortilla.  3 oz (85 g) cooked rice or pasta.  4 oz (113 g) cooked dried beans.  4 oz (113 g) starchy vegetable, such as peas, corn, or potatoes.  4 oz (113 g) hot cereal.  4 oz (113 g) mashed potatoes or  of a large baked potato.  4 oz (113 g) canned or frozen fruit.  4 oz (120 mL) fruit juice.  4-6 crackers.  6 chicken nuggets.  6 oz (170 g) unsweetened dry cereal.  6 oz (170 g) plain fat-free yogurt or yogurt sweetened with artificial sweeteners.  8 oz (240 mL) milk.  8 oz (170 g) fresh fruit or one small piece of fruit.  24 oz (680 g) popped popcorn. Example of carbohydrate counting Sample meal   3 oz (85 g) chicken breast.  6  oz (170 g) brown rice.  4 oz (113 g) corn.  8 oz (240 mL) milk.  8 oz (170 g) strawberries with sugar-free whipped topping. Carbohydrate calculation  1. Identify the foods that contain carbohydrates:  Rice.  Corn.  Milk.  Strawberries. 2. Calculate how many servings you have of each food:  2 servings rice.  1 serving corn.  1 serving milk.  1 serving strawberries. 3. Multiply each number of servings by 15 g:  2 servings rice x 15 g = 30 g.  1 serving corn x 15 g = 15 g.  1 serving milk x 15 g = 15  g.  1 serving strawberries x 15 g = 15 g. 4. Add together all of the amounts to find the total grams of carbohydrates eaten:  30 g + 15 g + 15 g + 15 g = 75 g of carbohydrates total. This information is not intended to replace advice given to you by your health care provider. Make sure you discuss any questions you have with your health care provider. Document Released: 12/07/2005 Document Revised: 06/26/2016 Document Reviewed: 05/20/2016 Elsevier Interactive Patient Education  2017 [employer]   Diabetic Neuropathy Diabetic neuropathy is a nerve disease or nerve damage that is caused by diabetes mellitus. About half of all people with diabetes mellitus have some form of nerve damage. Nerve damage is more common in those who have had diabetes mellitus for many years and who generally have not had good control of their blood sugar (glucose) level. Diabetic neuropathy is a common complication of diabetes mellitus. There are three common types of diabetic neuropathy and a fourth type that is less common and less understood:  Peripheral neuropathy-This is the most common type of diabetic neuropathy. It causes damage to the nerves of the feet and legs first and then eventually the hands and arms. The damage affects the ability to sense touch.  Autonomic neuropathy-This type causes damage to the autonomic nervous system, which controls the following functions:  Heartbeat.  Body temperature.  Blood pressure.  Urination.  Digestion.  Sweating.  Sexual function.  Focal neuropathy-Focal neuropathy can be painful and unpredictable and occurs most often in older adults with diabetes mellitus. It involves a specific nerve or one area and often comes on suddenly. It usually does not cause long-term problems.  Radiculoplexus neuropathy- Sometimes called lumbosacral radiculoplexus neuropathy, radiculoplexus neuropathy affects the nerves of the thighs, hips, buttocks, or legs. It is more common  in people with type 2 diabetes mellitus and in older men. It is characterized by debilitating pain, weakness, and atrophy, usually in the thigh muscles. What are the causes? The cause of peripheral, autonomic, and focal neuropathies is diabetes mellitus that is uncontrolled and high glucose levels. The cause of radiculoplexus neuropathy is unknown. However, it is thought to be caused by inflammation related to uncontrolled glucose levels. What are the signs or symptoms? Peripheral Neuropathy  Peripheral neuropathy develops slowly over time. When the nerves of the feet and legs no longer work there may be:  Burning, stabbing, or aching pain in the legs or feet.  Inability to feel pressure or pain in your feet. This can lead to:  Thick calluses over pressure areas.  Pressure sores.  Ulcers.  Foot deformities.  Reduced ability to feel temperature changes.  Muscle weakness. Autonomic Neuropathy  The symptoms of autonomic neuropathy vary depending on which nerves are affected. Symptoms may include:  Problems with digestion, such as:  Feeling sick to your  stomach (nausea).  Vomiting.  Bloating.  Constipation.  Diarrhea.  Abdominal pain.  Difficulty with urination. This occurs if you lose your ability to sense when your bladder is full. Problems include:  Urine leakage (incontinence).  Inability to empty your bladder completely (retention).  Rapid or irregular heartbeat (palpitations).  Blood pressure drops when you stand up (orthostatic hypotension). When you stand up you may feel:  Dizzy.  Weak.  Faint.  In men, inability to attain and maintain an erection.  In women, vaginal dryness and problems with decreased sexual desire and arousal.  Problems with body temperature regulation.  Increased or decreased sweating. Focal Neuropathy   Abnormal eye movements or abnormal alignment of both eyes.  Weakness in the wrist.  Foot drop. This results in an inability  to lift the foot properly and abnormal walking or foot movement.  Paralysis on one side of your face (Bell palsy).  Chest or abdominal pain. Radiculoplexus Neuropathy   Sudden, severe pain in your hip, thigh, or buttocks.  Weakness and wasting of thigh muscles.  Difficulty rising from a seated position.  Abdominal swelling.  Unexplained weight loss (usually more than 10 lb [4.5 kg]). How is this diagnosed? Peripheral Neuropathy  Your senses may be tested. Sensory function testing can be done with:  A light touch using a monofilament.  A vibration with tuning fork.  A sharp sensation with a pin prick. Other tests that can help diagnose neuropathy are:  Nerve conduction velocity. This test checks the transmission of an electrical current through a nerve.  Electromyography. This shows how muscles respond to electrical signals transmitted by nearby nerves.  Quantitative sensory testing. This is used to assess how your nerves respond to vibrations and changes in temperature. Autonomic Neuropathy  Diagnosis is often based on reported symptoms. Tell your health care provider if you experience:  Dizziness.  Constipation.  Diarrhea.  Inappropriate urination or inability to urinate.  Inability to get or maintain an erection. Tests that may be done include:  Electrocardiography or Holter monitor. These are tests that can help show problems with the heart rate or heart rhythm.  An X-ray exam may be done. Focal Neuropathy  Diagnosis is made based on your symptoms and what your health care provider finds during your exam. Other tests may be done. They may include:  Nerve conduction velocities. This checks the transmission of electrical current through a nerve.  Electromyography. This shows how muscles respond to electrical signals transmitted by nearby nerves.  Quantitative sensory testing. This test is used to assess how your nerves respond to vibration and changes in  temperature. Radiculoplexus Neuropathy   Often the first thing is to eliminate any other issue or problems that might be the cause, as there is no standard test for diagnosis.  X-ray exam of your spine and lumbar region.  Spinal tap to rule out cancer.  MRI to rule out other lesions. How is this treated? Once nerve damage occurs, it cannot be reversed. The goal of treatment is to keep the disease or nerve damage from getting worse and affecting more nerve fibers. Controlling your blood glucose level is the key. Most people with radiculoplexus neuropathy see at least a partial improvement over time. You will need to keep your blood glucose and HbA1c levels in the target range determined by your health care provider. Things that help control blood glucose levels include:  Blood glucose monitoring.  Meal planning.  Physical activity.  Diabetes medicine. Over time, maintaining lower blood  glucose levels helps lessen symptoms. Sometimes, prescription pain medicine is needed. Follow these instructions at home:  Do not smoke.  Keep your blood glucose level in the range that you and your health care provider have determined acceptable for you.  Keep your blood pressure level in the range that you and your health care provider have determined acceptable for you.  Eat a well-balanced diet.  Be physically active every day. Include strength training and balance exercises.  Protect your feet.  Check your feet every day for sores, cuts, blisters, or signs of infection.  Wear padded socks and supportive shoes. Use orthotic inserts, if necessary.  Regularly check the insides of your shoes for worn spots. Make sure there are no rocks or other items inside your shoes before you put them on. Contact a health care provider if:  You have burning, stabbing, or aching pain in the legs or feet.  You are unable to feel pressure or pain in your feet.  You develop problems with digestion such  as:  Nausea.  Vomiting.  Bloating.  Constipation.  Diarrhea.  Abdominal pain.  You have difficulty with urination, such as:  Incontinence.  Retention.  You have palpitations.  You develop orthostatic hypotension. When you stand up you may feel:  Dizzy.  Weak.  Faint.  You cannot attain and maintain an erection (in men).  You have vaginal dryness and problems with decreased sexual desire and arousal (in women).  You have severe pain in your thighs, legs, or buttocks.  You have unexplained weight loss. This information is not intended to replace advice given to you by your health care provider. Make sure you discuss any questions you have with your health care provider. Document Released: 02/15/2002 Document Revised: 05/14/2016 Document Reviewed: 05/18/2013 Elsevier Interactive Patient Education  2017 [place].  Increase Gabapentin to 3 tablets by mouth three times daily, 900mg  three times daily. Please call clinic when you need a refill. Continue regular movement and heart healthy diet. Emmit Alexanders in [place]!

## 2017-03-04 NOTE — Assessment & Plan Note (Signed)
Increased Neurontin to 900mg  TID, increased from 600mg  TID. She denies sedation or other medication SE. Current Rx d/c'd and updated. She does not require refill today since she just recently filled Rx last week. She will call office when she requires refill prior to returning to [place].

## 2017-03-04 NOTE — Progress Notes (Signed)
Subjective:    Patient ID: Jasmine Davila, female    DOB: Feb 01, 1951, 66 y.o.   MRN: [id number]  HPI:  Jasmine Davila presents for f/u T2D, HTN, neuropathy.  She is compliant on all medications, denies SE.  She denies CP/dyspnea/dizziness/HA/neuropathy/hypoglycemic episodes.  She would like an increase in her Neurontin dose, she reports "my feet feel like dead weights".   A1c today 6.5. She will return to [place] next month for ca f/u -CT scan.   Patient Care Team    Relationship Specialty Notifications Start End  [name], DO PCP - General Family Medicine  11/24/16   [name], MD Consulting Physician Oncology  11/24/16     Patient Active Problem List   Diagnosis Date Noted  . Sleep apnea with CPAP 12/29/2016  . History of decreased platelet count 12/29/2016  . Class 2 obesity with serious comorbidity in adult 11/25/2016  . Diabetes mellitus due to underlying condition with diabetic nephropathy, with long-term current use of insulin ([place]) 11/25/2016  . Smoking hx 11/24/2016  . Hypertension 11/24/2016  . Chronic pancreatitis ([place]) 11/24/2016  . 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 Diabetic neuropathy - exaccerbated by chemo 11/24/2016  . GERD (gastroesophageal reflux disease) 11/24/2016  . h/o Hypertriglyceridemia without hypercholesterolemia 11/24/2016  . B12 deficiency 11/24/2016  . Vitamin B6 deficiency (non anemic) 11/24/2016     Past Medical History:  Diagnosis Date  . Acute pancreatitis   . Cancer ([place])    B cell lymphoma- Non Hodgkins in remission  . Diabetic neuropathy ([place])   . Hypertension      Past Surgical History:  Procedure Laterality Date  . CHOLECYSTECTOMY    . MEDIPORT INSERTION, DOUBLE    . MEDIPORT REMOVAL       Family History  Problem Relation Age of Onset  . Cancer Mother     colon  . Heart attack Father   . Heart disease Sister   . Alcohol abuse Maternal Grandfather       History  Drug Use No     History  Alcohol Use No     History  Smoking Status  . Former Smoker  . Packs/day: 0.50  . Years: 4.00  . Types: Cigarettes  . Quit date: 12/21/1968  Smokeless Tobacco  . Never Used     Outpatient Encounter Prescriptions as of 03/04/2017  Medication Sig  . aspirin EC 81 MG tablet Take 81 mg by mouth daily.  . Cholecalciferol (VITAMIN D-3) 5000 units TABS Take 1 tablet by mouth daily.  . cyanocobalamin (,VITAMIN B-12,) 1000 MCG/ML injection Inject 0.5 mLs (500 mcg total) into the muscle every 30 (thirty) days.  . famotidine (PEPCID) 20 MG tablet Take 20 mg by mouth 2 (two) times daily.  Marland Kitchen gabapentin (NEURONTIN) 300 MG capsule Take 2 capsules (600 mg total) by mouth 3 (three) times daily.  . insulin glargine (LANTUS) 100 UNIT/ML injection Inject 70 Units into the skin every morning.  . insulin lispro (HUMALOG KWIKPEN) 100 UNIT/ML KiwkPen Inject 24 Units into the skin. Daily with largest meal  . losartan (COZAAR) 25 MG tablet Take 25 mg by mouth daily.  . Multiple Vitamin (MULTIVITAMIN) tablet Take 1 tablet by mouth daily.  . niacin (NIASPAN) 1000 MG CR tablet Take 1 tablet (1,000 mg total) by mouth at bedtime. An hour after 81 mg ASA  . pyridoxine (B6 NATURAL) 100 MG tablet Take 200 mg by mouth daily.  . [  DISCONTINUED] amitriptyline (ELAVIL) 25 MG tablet Take 1-2 tablets (25-50 mg total) by mouth at bedtime.  . [DISCONTINUED] gabapentin (NEURONTIN) 100 MG capsule Take 2 capsules (200 mg total) by mouth 3 (three) times daily.   No facility-administered encounter medications on file as of 03/04/2017.     Allergies: Dilaudid [hydromorphone hcl]; Doxycycline; and Metronidazole  Body mass index is 36.37 kg/m.  Blood pressure 117/73, pulse 71, height 5\' 7"  (1.702 m), weight 232 lb 3.2 oz (105.3 kg).     Review of Systems  Constitutional: Negative for activity change, appetite change, chills, diaphoresis, fatigue, fever and unexpected weight  change.  HENT: Negative for congestion.   Eyes: Negative for visual disturbance.  Respiratory: Negative for cough, chest tightness, shortness of breath and stridor.   Cardiovascular: Negative for chest pain, palpitations and leg swelling.  Gastrointestinal: Negative for abdominal distention, abdominal pain, blood in stool, constipation, diarrhea, nausea and vomiting.  Endocrine: Negative for cold intolerance, heat intolerance, polydipsia, polyphagia and polyuria.  Genitourinary: Negative for difficulty urinating and flank pain.  Musculoskeletal: Positive for arthralgias, gait problem and myalgias. Negative for back pain and joint swelling.       Bil lower ext neuropathy.  Neurological: Positive for numbness. Negative for dizziness, tremors, weakness, light-headedness and headaches.       Objective:   Physical Exam  Constitutional: She is oriented to person, place, and time. She appears well-developed and well-nourished. No distress.  HENT:  Head: Normocephalic and atraumatic.  Eyes: Conjunctivae are normal. Pupils are equal, round, and reactive to light.  Neck: Normal range of motion.  Cardiovascular: Normal rate, regular rhythm, normal heart sounds and intact distal pulses.   Pulmonary/Chest: Breath sounds normal. No respiratory distress. She has no wheezes. She has no rales. She exhibits no tenderness.  Musculoskeletal: She exhibits no tenderness or deformity.  Lymphadenopathy:    She has no cervical adenopathy.  Neurological: She is alert and oriented to person, place, and time.  Numbness/tingling of feet/legs, especially lateral side.  Skin: Skin is warm and dry. No rash noted. She is not diaphoretic. No erythema. No pallor.  Psychiatric: She has a normal mood and affect. Her behavior is normal. Judgment and thought content normal.          Assessment & Plan:   1. Diabetes mellitus due to underlying condition with diabetic nephropathy, with long-term current use of insulin  ([place])   2. Encounter for hepatitis C screening test for low risk patient   3. Diabetic polyneuropathy associated with type 2 diabetes mellitus ([facility])     Diabetic neuropathy - exaccerbated by chemo Increased Neurontin to 900mg  TID, increased from 600mg  TID. She denies sedation or other medication SE. Current Rx d/c'd and updated. She does not require refill today since she just recently filled Rx last week. She will call office when she requires refill prior to returning to [place].  Diabetes mellitus due to underlying condition with diabetic nephropathy, with long-term current use of insulin ([facility]) A1c 6.5 Urine Microalbumin normal. Encouraged to follow Diabetic Diet and to increase regular exercise. Advised to wear closed-toes shoes. Follow-up after she returns from [place].    FOLLOW-UP:  Return in about 6 months (around 09/04/2017) for Regular Follow Up, HTN, Diabetes.

## 2017-03-04 NOTE — Assessment & Plan Note (Signed)
>>  ASSESSMENT AND PLAN FOR DIABETIC NEUROPATHY - EXACCERBATED BY CHEMO WRITTEN ON 03/04/2017  3:51 PM BY BESS, KATY D, NP  Increased Neurontin to 900mg  TID, increased from 600mg  TID. She denies sedation or other medication SE. Current Rx d/c'd and updated. She does not require refill today since she just recently filled Rx last week. She will call office when she requires refill prior to returning to [place].

## 2017-03-04 NOTE — Assessment & Plan Note (Signed)
A1c 6.5 Urine Microalbumin normal. Encouraged to follow Diabetic Diet and to increase regular exercise. Advised to wear closed-toes shoes. Follow-up after she returns from [place].

## 2017-04-11 NOTE — Assessment & Plan Note (Signed)
Monthly injections and takes daily B12.  Now elevated- at 1900- needs to dec PO intake.  Will reck 6-52mo

## 2017-04-21 DIAGNOSIS — C8339 Diffuse large B-cell lymphoma, extranodal and solid organ sites: Secondary | ICD-10-CM | POA: Diagnosis not present

## 2017-04-26 ENCOUNTER — Encounter: Payer: Self-pay | Admitting: Family Medicine

## 2017-04-27 ENCOUNTER — Telehealth: Payer: Self-pay | Admitting: Family Medicine

## 2017-04-27 DIAGNOSIS — C8339 Diffuse large B-cell lymphoma, extranodal and solid organ sites: Secondary | ICD-10-CM | POA: Diagnosis not present

## 2017-04-27 DIAGNOSIS — Z1159 Encounter for screening for other viral diseases: Secondary | ICD-10-CM | POA: Diagnosis not present

## 2017-04-27 DIAGNOSIS — E1142 Type 2 diabetes mellitus with diabetic polyneuropathy: Secondary | ICD-10-CM | POA: Diagnosis not present

## 2017-04-27 DIAGNOSIS — I10 Essential (primary) hypertension: Secondary | ICD-10-CM | POA: Diagnosis not present

## 2017-04-27 LAB — HEPATIC FUNCTION PANEL
ALK PHOS: 74 U/L (ref 25–125)
ALT: 37 U/L — AB (ref 7–35)
AST: 36 U/L — AB (ref 13–35)
Bilirubin, Total: 0.9 mg/dL

## 2017-04-27 LAB — BASIC METABOLIC PANEL
BUN: 12 mg/dL (ref 4–21)
Creatinine: 0.8 mg/dL (ref 0.5–1.1)
GLUCOSE: 111 mg/dL
Potassium: 4.2 mmol/L (ref 3.4–5.3)
Sodium: 140 mmol/L (ref 137–147)

## 2017-04-27 LAB — LIPID PANEL
Cholesterol: 152 mg/dL (ref 0–200)
HDL: 50 mg/dL (ref 35–70)
LDL CALC: 86 mg/dL
Triglycerides: 81 mg/dL (ref 40–160)

## 2017-04-27 NOTE — Telephone Encounter (Signed)
Patient is in [place] and can't get her gabapentin transferred up there to be filled. She needs a whole new one sent to [facility] up in [place]. [facility] store [phone] and their phone number is [phone].

## 2017-04-27 NOTE — Telephone Encounter (Signed)
MyChart message already forwarded to Dr. Raliegh Scarlet.  Awaiting her response.  Charyl Bigger, CMA

## 2017-04-28 DIAGNOSIS — E559 Vitamin D deficiency, unspecified: Secondary | ICD-10-CM | POA: Diagnosis not present

## 2017-04-28 DIAGNOSIS — Z8572 Personal history of non-Hodgkin lymphomas: Secondary | ICD-10-CM | POA: Diagnosis not present

## 2017-04-28 DIAGNOSIS — C8339 Diffuse large B-cell lymphoma, extranodal and solid organ sites: Secondary | ICD-10-CM | POA: Diagnosis not present

## 2017-04-28 DIAGNOSIS — E119 Type 2 diabetes mellitus without complications: Secondary | ICD-10-CM | POA: Diagnosis not present

## 2017-04-28 DIAGNOSIS — G629 Polyneuropathy, unspecified: Secondary | ICD-10-CM | POA: Diagnosis not present

## 2017-05-14 DIAGNOSIS — C8339 Diffuse large B-cell lymphoma, extranodal and solid organ sites: Secondary | ICD-10-CM | POA: Diagnosis not present

## 2017-05-14 DIAGNOSIS — E1142 Type 2 diabetes mellitus with diabetic polyneuropathy: Secondary | ICD-10-CM | POA: Diagnosis not present

## 2017-05-14 DIAGNOSIS — G608 Other hereditary and idiopathic neuropathies: Secondary | ICD-10-CM | POA: Diagnosis not present

## 2017-05-14 DIAGNOSIS — I10 Essential (primary) hypertension: Secondary | ICD-10-CM | POA: Diagnosis not present

## 2017-06-17 DIAGNOSIS — M1811 Unilateral primary osteoarthritis of first carpometacarpal joint, right hand: Secondary | ICD-10-CM | POA: Diagnosis not present

## 2017-06-29 DIAGNOSIS — M1811 Unilateral primary osteoarthritis of first carpometacarpal joint, right hand: Secondary | ICD-10-CM | POA: Diagnosis not present

## 2017-07-07 DIAGNOSIS — I10 Essential (primary) hypertension: Secondary | ICD-10-CM | POA: Diagnosis not present

## 2017-07-12 DIAGNOSIS — M1811 Unilateral primary osteoarthritis of first carpometacarpal joint, right hand: Secondary | ICD-10-CM | POA: Diagnosis not present

## 2017-07-14 DIAGNOSIS — H25811 Combined forms of age-related cataract, right eye: Secondary | ICD-10-CM | POA: Diagnosis not present

## 2017-07-14 DIAGNOSIS — E119 Type 2 diabetes mellitus without complications: Secondary | ICD-10-CM | POA: Diagnosis not present

## 2017-07-14 DIAGNOSIS — H25812 Combined forms of age-related cataract, left eye: Secondary | ICD-10-CM | POA: Diagnosis not present

## 2017-07-24 DIAGNOSIS — M150 Primary generalized (osteo)arthritis: Secondary | ICD-10-CM | POA: Diagnosis not present

## 2017-07-24 DIAGNOSIS — I10 Essential (primary) hypertension: Secondary | ICD-10-CM | POA: Diagnosis not present

## 2017-07-24 DIAGNOSIS — E1142 Type 2 diabetes mellitus with diabetic polyneuropathy: Secondary | ICD-10-CM | POA: Diagnosis not present

## 2017-07-24 DIAGNOSIS — C8339 Diffuse large B-cell lymphoma, extranodal and solid organ sites: Secondary | ICD-10-CM | POA: Diagnosis not present

## 2017-08-18 DIAGNOSIS — K838 Other specified diseases of biliary tract: Secondary | ICD-10-CM | POA: Diagnosis not present

## 2017-08-18 DIAGNOSIS — J9811 Atelectasis: Secondary | ICD-10-CM | POA: Diagnosis not present

## 2017-08-18 DIAGNOSIS — D7389 Other diseases of spleen: Secondary | ICD-10-CM | POA: Diagnosis not present

## 2017-08-18 DIAGNOSIS — C8339 Diffuse large B-cell lymphoma, extranodal and solid organ sites: Secondary | ICD-10-CM | POA: Diagnosis not present

## 2017-08-18 DIAGNOSIS — M899 Disorder of bone, unspecified: Secondary | ICD-10-CM | POA: Diagnosis not present

## 2017-08-24 DIAGNOSIS — C8339 Diffuse large B-cell lymphoma, extranodal and solid organ sites: Secondary | ICD-10-CM | POA: Diagnosis not present

## 2017-08-24 DIAGNOSIS — I10 Essential (primary) hypertension: Secondary | ICD-10-CM | POA: Diagnosis not present

## 2017-08-24 DIAGNOSIS — E7800 Pure hypercholesterolemia, unspecified: Secondary | ICD-10-CM | POA: Diagnosis not present

## 2017-08-24 DIAGNOSIS — E1142 Type 2 diabetes mellitus with diabetic polyneuropathy: Secondary | ICD-10-CM | POA: Diagnosis not present

## 2017-08-24 LAB — CBC AND DIFFERENTIAL
HEMATOCRIT: 40 (ref 36–46)
Hemoglobin: 13.7 (ref 12.0–16.0)
PLATELETS: 129 — AB (ref 150–399)
WBC: 6

## 2017-08-24 LAB — LIPID PANEL
Cholesterol: 156 (ref 0–200)
HDL: 50 (ref 35–70)
LDL Cholesterol: 82
Triglycerides: 119 (ref 40–160)

## 2017-08-24 LAB — TSH: TSH: 0.47 (ref 0.41–5.90)

## 2017-08-24 LAB — HEMOGLOBIN A1C: HEMOGLOBIN A1C: 5.2

## 2017-08-24 LAB — BASIC METABOLIC PANEL
CREATININE: 0.7 (ref 0.5–1.1)
GLUCOSE: 129
Potassium: 4.2 (ref 3.4–5.3)
Sodium: 138 (ref 137–147)

## 2017-08-24 LAB — HEPATIC FUNCTION PANEL
ALK PHOS: 76 (ref 25–125)
ALT: 27 (ref 7–35)
AST: 31 (ref 13–35)

## 2017-08-25 DIAGNOSIS — E119 Type 2 diabetes mellitus without complications: Secondary | ICD-10-CM | POA: Diagnosis not present

## 2017-08-25 DIAGNOSIS — G629 Polyneuropathy, unspecified: Secondary | ICD-10-CM | POA: Diagnosis not present

## 2017-08-25 DIAGNOSIS — C8339 Diffuse large B-cell lymphoma, extranodal and solid organ sites: Secondary | ICD-10-CM | POA: Diagnosis not present

## 2017-08-31 DIAGNOSIS — M150 Primary generalized (osteo)arthritis: Secondary | ICD-10-CM | POA: Diagnosis not present

## 2017-08-31 DIAGNOSIS — C8339 Diffuse large B-cell lymphoma, extranodal and solid organ sites: Secondary | ICD-10-CM | POA: Diagnosis not present

## 2017-08-31 DIAGNOSIS — I10 Essential (primary) hypertension: Secondary | ICD-10-CM | POA: Diagnosis not present

## 2017-08-31 DIAGNOSIS — Z23 Encounter for immunization: Secondary | ICD-10-CM | POA: Diagnosis not present

## 2017-08-31 DIAGNOSIS — E1142 Type 2 diabetes mellitus with diabetic polyneuropathy: Secondary | ICD-10-CM | POA: Diagnosis not present

## 2017-12-08 ENCOUNTER — Encounter: Payer: Self-pay | Admitting: Family Medicine

## 2017-12-08 ENCOUNTER — Ambulatory Visit (INDEPENDENT_AMBULATORY_CARE_PROVIDER_SITE_OTHER): Payer: Medicare Other | Admitting: Family Medicine

## 2017-12-08 VITALS — BP 123/78 | HR 67 | Wt 221.0 lb

## 2017-12-08 DIAGNOSIS — E0821 Diabetes mellitus due to underlying condition with diabetic nephropathy: Secondary | ICD-10-CM | POA: Diagnosis not present

## 2017-12-08 DIAGNOSIS — I10 Essential (primary) hypertension: Secondary | ICD-10-CM

## 2017-12-08 DIAGNOSIS — Z794 Long term (current) use of insulin: Secondary | ICD-10-CM | POA: Diagnosis not present

## 2017-12-08 DIAGNOSIS — C801 Malignant (primary) neoplasm, unspecified: Secondary | ICD-10-CM | POA: Diagnosis not present

## 2017-12-08 DIAGNOSIS — E785 Hyperlipidemia, unspecified: Secondary | ICD-10-CM

## 2017-12-08 DIAGNOSIS — E1159 Type 2 diabetes mellitus with other circulatory complications: Secondary | ICD-10-CM | POA: Diagnosis not present

## 2017-12-08 DIAGNOSIS — Z862 Personal history of diseases of the blood and blood-forming organs and certain disorders involving the immune mechanism: Secondary | ICD-10-CM

## 2017-12-08 DIAGNOSIS — E531 Pyridoxine deficiency: Secondary | ICD-10-CM | POA: Diagnosis not present

## 2017-12-08 DIAGNOSIS — K861 Other chronic pancreatitis: Secondary | ICD-10-CM | POA: Diagnosis not present

## 2017-12-08 DIAGNOSIS — E538 Deficiency of other specified B group vitamins: Secondary | ICD-10-CM | POA: Diagnosis not present

## 2017-12-08 DIAGNOSIS — E1169 Type 2 diabetes mellitus with other specified complication: Secondary | ICD-10-CM

## 2017-12-08 DIAGNOSIS — E1142 Type 2 diabetes mellitus with diabetic polyneuropathy: Secondary | ICD-10-CM

## 2017-12-08 DIAGNOSIS — Z6841 Body Mass Index (BMI) 40.0 and over, adult: Secondary | ICD-10-CM | POA: Diagnosis not present

## 2017-12-08 DIAGNOSIS — I152 Hypertension secondary to endocrine disorders: Secondary | ICD-10-CM

## 2017-12-08 DIAGNOSIS — E559 Vitamin D deficiency, unspecified: Secondary | ICD-10-CM | POA: Diagnosis not present

## 2017-12-08 LAB — POCT GLYCOSYLATED HEMOGLOBIN (HGB A1C): Hemoglobin A1C: 5.7

## 2017-12-08 MED ORDER — GABAPENTIN 300 MG PO CAPS: 1800.0000 mg | ORAL_CAPSULE | Freq: Three times a day (TID) | ORAL | 3 refills | Status: DC

## 2017-12-08 NOTE — Progress Notes (Signed)
Impression and Recommendations:    1. Hypertension associated with diabetes ([place])   2. Hyperlipidemia associated with type 2 diabetes mellitus ([place])   3. H/o Cancer:   B- cell lymphoma (spleen and lung) - non-Hodgkin's   4. Vitamin B6 deficiency (non anemic)   5. Diabetic polyneuropathy associated with type 2 diabetes mellitus ([place])   6. Diabetes mellitus due to underlying condition with diabetic nephropathy, with long-term current use of insulin ([place])   7. Other chronic pancreatitis ([place])   8. BMI 40.0-44.9, adult ([place])   9. B12 deficiency   10. History of decreased platelet count      Plan:  -Discussed with patient to change dose of gabapentin to 1800 mg TID to aid with neuropathy.  -Discussed with patient for use of basaglar versus lantus if Lantus becomes too expensive.    -discussed with patient regarding hand specialist to aid with bilateral thumb pain.   F/u in 6 weeks due to increased gabapentin dose.     In 3 months when you return for your diabetes at that time we will get an A1c, B12 and folate level.  If you would like to come in a week before the office visit for lab only visit to get an A1c and the folate B12 then we can have results at the office visit to discuss.  Going to go up on the Neurontin.  Please try to drink at least 7 bottles of water per day this should help to mask dramatically with your leg cramping.  We will also see in the labs if there are abnormalities.  F/u in 3 months with labs prior to [place] ordered this encounter  Medications  . gabapentin (NEURONTIN) 300 MG capsule    Sig: Take 6 capsules (1,800 mg total) by mouth 3 (three) times daily.    Dispense:  360 capsule    Refill:  3    Plan, per last visit on 12/29/2016: - start niaspan; LFT 6-8wks - cont to titrate up gabapentin to attain sx control - wt loss d/c pt; counseling done   History of decreased platelet count Patient will get me her labs and medical records from her  last doctors in [place]. We will just see and compare her platelet count of 137 now to her last ones.  Diabetes mellitus due to underlying condition with diabetic nephropathy, with long-term current use of insulin ([facility]) a1c was 6.1--> reck 3 mo  -> Pt was in the office today for 40+ minutes, with over 50% time spent in face to face counseling of various medical concerns and in coordination of care  -> Pt was in the office today for 40+ minutes, with over 50% time spent in face to face counseling of various medical concerns and in coordination of care   The patient was counseled, risk factors were discussed, anticipatory guidance given.  Gross side effects, risk and benefits, and alternatives of medications and treatment plan in general discussed with patient.  Patient is aware that all medications have potential side effects and we are unable to predict every side effect or drug-drug interaction that may occur.   Patient will call with any questions prior to using medication if they have concerns.  Expresses verbal understanding and consents to current therapy and treatment regimen.  No barriers to understanding were identified.  Red flag symptoms and signs discussed in detail.  Patient expressed understanding regarding what to do in case of emergency\urgent symptoms  Return for 6 wks- increased neurontin dose .  Please see AVS handed out to patient at the end of our visit for further patient instructions/ counseling done pertaining to today's office visit.    Note: This document was prepared using Dragon voice recognition software and may include unintentional dictation errors.  This document serves as a record of services personally performed by [name], MD. It was created on her behalf by Steva Colder, a trained medical scribe. The creation of this record is based on the scribe's personal observations and the provider's statements to them.   I have reviewed the above  documentation for accuracy and completeness, and I agree with the above.   [name] 12/17/17 2:33 PM  --------------------------------------------------------------------------------------------------------------------------------------------------------------------------------------------------------------------------------------------    Subjective:    CC:  Chief Complaint  Patient presents with  . Follow-up   HPI: Jasmine Davila is a 66 y.o. female who presents to the office today for a follow up visit.   Weight: -March 2018 she started Weight Watchers with her husband and has since lost 15-20 lb weight lost.   -She notes that she has taken a break from weight watchers and will continue weight watchers in January.   DM: -Her A1c is 5.7 today, 12/08/2017 compared to 5.2 3 months ago  -Her fasting blood sugar levels range from 90-150.   -Her Lantus dose was changed from 70 units to 60 units and she takes Lantus in the morning. She also takes Humalog and the dose was decreased from 24 to 20 units which she takes subcutaneous daily with her largest meal.     Bilateral thumb pain:  -She previously had right thumb surgery and she is now having symptoms of carpal tunnel again. As well as her left thumb is now painful and she would like a hand surgeon referral.    -Discussed and recommended hand specialist, for further evaluation of bilateral thumb pain.  HTN:  She denies CP, SOB, DOE, or any other symptoms.   Peripheral neuropathy:  -She currently takes 900 mg TID gabapentin and notes that it is not working for her.   Health Maintenance:  -She started folate acid 1.5-2 weeks ago with no acute symptoms.  -She notes that she has cramps to her bilateral calves and that she is drinking a decreased amount of water.   HPI per last visit on 12/29/2016:   Jasmine Davila is a 66 y.o. female who presents to [place] at [facility] today -  started elavil and neurontin last ov.    - Tolerating Neurontin ok- NO S-E.   Pt is on day 5 only of the meds.  Taking 2, 100mg 's per day.    Minimal improvement of her peripheral neuropathy sx---> legs aren't as heavy and feet don't hurt as much though when pressed.   No exercise yet;   little change in diet since d/c pt high TG.  Will consider taking meds today.    Wt Readings from Last 3 Encounters:  12/08/17 221 lb (100.2 kg)  03/04/17 232 lb 3.2 oz (105.3 kg)  12/29/16 226 lb 14.4 oz (102.9 kg)   BP Readings from Last 3 Encounters:  12/08/17 123/78  03/04/17 117/73  12/29/16 123/80   Pulse Readings from Last 3 Encounters:  12/08/17 67  03/04/17 71  12/29/16 80   BMI Readings from Last 3 Encounters:  12/08/17 34.61 kg/m  03/04/17 36.37 kg/m  12/29/16 35.54 kg/m     Patient Care Team  Relationship Specialty Notifications Start End  [name], DO PCP - General Family Medicine  11/24/16   [name], MD Consulting Physician Oncology  11/24/16     Patient Active Problem List   Diagnosis Date Noted  . Diabetes mellitus due to underlying condition with diabetic nephropathy, with long-term current use of insulin ([place]) 11/25/2016    Priority: High  . Hypertension associated with diabetes ([place]) 11/24/2016    Priority: High  . H/o Cancer:   B- cell lymphoma (spleen and lung) - non-Hodgkin's 11/24/2016    Priority: High  . Diabetic neuropathy - exaccerbated by chemo 11/24/2016    Priority: High  . Hyperlipidemia associated with type 2 diabetes mellitus ([place]) 11/24/2016    Priority: High  . BMI 40.0-44.9, adult ([place]) 11/25/2016    Priority: Medium  . Smoking hx 11/24/2016    Priority: Medium  . Chronic pancreatitis ([place]) 11/24/2016    Priority: Medium  . Vitamin B6 deficiency (non anemic) 11/24/2016    Priority: Medium  . GERD (gastroesophageal reflux disease) 11/24/2016    Priority: Low  . B12 deficiency 11/24/2016    Priority: Low  . Sleep  apnea with CPAP 12/29/2016  . History of decreased platelet count 12/29/2016    Past Medical history, Surgical history, Family history, Social history, Allergies and Medications have been entered into the medical record, reviewed and changed as needed.   Allergies:  Allergies  Allergen Reactions  . Dilaudid [Hydromorphone Hcl] Other (See Comments)    anxious  . Doxycycline Other (See Comments)    Pancreatic issues  . Metronidazole Other (See Comments)    Pt doesn't recall    Review of Systems  Constitutional: Negative for chills and fever.  HENT: Negative for congestion and sinus pain.   Eyes: Negative for blurred vision and double vision.  Respiratory: Negative for shortness of breath and wheezing.   Cardiovascular: Negative for chest pain, palpitations and orthopnea.  Gastrointestinal: Negative for constipation, diarrhea, heartburn, nausea and vomiting.  Genitourinary: Negative for frequency and hematuria.  Musculoskeletal: Positive for joint pain. Negative for falls.       Chronic jt pains  Skin: Negative for rash.  Neurological: Negative for dizziness, sensory change and focal weakness.  Endo/Heme/Allergies: Positive for polydipsia.  Psychiatric/Behavioral: Negative for depression and suicidal ideas. The patient is not nervous/anxious and does not have insomnia.      Objective:   Blood pressure 123/78, pulse 67, weight 221 lb (100.2 kg), SpO2 98 %. Body mass index is 34.61 kg/m. General: Well Developed, well nourished, appropriate for stated age.  Neuro: Alert and oriented x3, extra-ocular muscles intact, sensation grossly intact.  HEENT: Normocephalic, atraumatic, neck supple, no carotid bruits appreciated  Skin: no gross rash. Cardiac: RRR, S1 S2 Respiratory: ECTA B/L, Not using accessory muscles, speaking in full sentences-unlabored. Vascular:  Ext warm, dry, pink; cap RF less 2 sec. Psych: No HI/SI, judgement and insight good, Euthymic mood. Full  Affect.    Recent Results (from the past 2160 hour(s))  POCT glycosylated hemoglobin (Hb A1C)     Status: Normal   Collection Time: 12/08/17  4:06 PM  Result Value Ref Range   Hemoglobin A1C 5.7   VITAMIN D 25 Hydroxy (Vit-D Deficiency, Fractures)     Status: None   Collection Time: 12/08/17  4:11 PM  Result Value Ref Range   Vit D, 25-Hydroxy 49.1 30.0 - 100.0 ng/mL    Comment: Vitamin D deficiency has been defined by the Institute of Medicine and an  Endocrine Society practice guideline as a level of serum 25-OH vitamin D less than 20 ng/mL (1,2). The Endocrine Society went on to further define vitamin D insufficiency as a level between 21 and 29 ng/mL (2). 1. IOM (Institute of Medicine). 2010. Dietary reference    intakes for calcium and D. [place]: The    [employer]. 2. Holick MF, Binkley Rockwell, Bischoff-Ferrari HA, et al.    Evaluation, treatment, and prevention of vitamin D    deficiency: an Endocrine Society clinical practice    guideline. JCEM. 2011 Jul; 96(7):1911-30.   TSH     Status: None   Collection Time: 12/08/17  4:11 PM  Result Value Ref Range   TSH 1.670 0.450 - 4.500 uIU/mL  T4, free     Status: None   Collection Time: 12/08/17  4:11 PM  Result Value Ref Range   Free T4 1.12 0.82 - 1.77 ng/dL  Phosphorus     Status: None   Collection Time: 12/08/17  4:11 PM  Result Value Ref Range   Phosphorus 3.0 2.5 - 4.5 mg/dL  Magnesium     Status: None   Collection Time: 12/08/17  4:11 PM  Result Value Ref Range   Magnesium 1.9 1.6 - 2.3 mg/dL  Lipid panel     Status: None   Collection Time: 12/08/17  4:11 PM  Result Value Ref Range   Cholesterol, Total 170 100 - 199 mg/dL   Triglycerides 102 0 - 149 mg/dL   HDL 77 >39 mg/dL   VLDL Cholesterol Cal 20 5 - 40 mg/dL   LDL Calculated 73 0 - 99 mg/dL   Chol/HDL Ratio 2.2 0.0 - 4.4 ratio    Comment:                                   T. Chol/HDL Ratio                                             Men   Women                               1/2 Avg.Risk  3.4    3.3                                   Avg.Risk  5.0    4.4                                2X Avg.Risk  9.6    7.1                                3X Avg.Risk 23.4   11.0   Hemoglobin A1c     Status: Abnormal   Collection Time: 12/08/17  4:11 PM  Result Value Ref Range   Hgb A1c MFr Bld 5.7 (H) 4.8 - 5.6 %    Comment:          Prediabetes: 5.7 - 6.4          Diabetes: >6.4  Glycemic control for adults with diabetes: <7.0    Est. average glucose Bld gHb Est-mCnc 117 mg/dL  Comprehensive metabolic panel     Status: Abnormal   Collection Time: 12/08/17  4:11 PM  Result Value Ref Range   Glucose 76 65 - 99 mg/dL   BUN 10 8 - 27 mg/dL   Creatinine, Ser 0.74 0.57 - 1.00 mg/dL   GFR calc non Af Amer 85 >59 mL/min/1.73   GFR calc Af Amer 98 >59 mL/min/1.73   BUN/Creatinine Ratio 14 12 - 28   Sodium 139 134 - 144 mmol/L   Potassium 4.0 3.5 - 5.2 mmol/L   Chloride 98 96 - 106 mmol/L   CO2 27 20 - 29 mmol/L   Calcium 9.9 8.7 - 10.3 mg/dL   Total Protein 7.4 6.0 - 8.5 g/dL   Albumin 4.6 3.6 - 4.8 g/dL   Globulin, Total 2.8 1.5 - 4.5 g/dL   Albumin/Globulin Ratio 1.6 1.2 - 2.2   Bilirubin Total 0.6 0.0 - 1.2 mg/dL   Alkaline Phosphatase 87 39 - 117 IU/L   AST 35 0 - 40 IU/L   ALT 34 (H) 0 - 32 IU/L  CBC with Differential/Platelet     Status: None   Collection Time: 12/08/17  4:11 PM  Result Value Ref Range   WBC 6.0 3.4 - 10.8 x10E3/uL   RBC 4.60 3.77 - 5.28 x10E6/uL   Hemoglobin 14.0 11.1 - 15.9 g/dL   Hematocrit 41.0 34.0 - 46.6 %   MCV 89 79 - 97 fL   MCH 30.4 26.6 - 33.0 pg   MCHC 34.1 31.5 - 35.7 g/dL   RDW 13.8 12.3 - 15.4 %   Platelets 151 150 - 379 x10E3/uL   Neutrophils 68 Not Estab. %   Lymphs 26 Not Estab. %   Monocytes 4 Not Estab. %   Eos 2 Not Estab. %   Basos 0 Not Estab. %   Neutrophils Absolute 4.0 1.4 - 7.0 x10E3/uL   Lymphocytes Absolute 1.6 0.7 - 3.1 x10E3/uL   Monocytes Absolute 0.2 0.1 -  0.9 x10E3/uL   EOS (ABSOLUTE) 0.1 0.0 - 0.4 x10E3/uL   Basophils Absolute 0.0 0.0 - 0.2 x10E3/uL   Immature Granulocytes 0 Not Estab. %   Immature Grans (Abs) 0.0 0.0 - 0.1 x10E3/uL  Microalbumin / creatinine urine ratio     Status: None   Collection Time: 12/08/17  4:11 PM  Result Value Ref Range   Creatinine, Urine 93.5 Not Estab. mg/dL   Albumin, Urine <3.0 Not Estab. ug/mL   Microalb/Creat Ratio <3.2 0.0 - 30.0 mg/g creat    Comment:                      Normal:                0.0 -  30.0                      Albuminuria:          31.0 - 300.0                      Clinical albuminuria:       >300.0

## 2017-12-08 NOTE — Patient Instructions (Addendum)
In 3 months when you return for your diabetes at that time we will get an A1c, B12 and folate level.  If you would like to come in a week before the office visit for lab only visit to get an A1c and the folate B12 then we can have results at the office visit to discuss.  Going to go up on the Neurontin.  Please try to drink at least 7 bottles of water per day this should help to mask dramatically with your leg cramping.  We will also see in the labs if there are abnormalities.

## 2017-12-09 LAB — CBC WITH DIFFERENTIAL/PLATELET
BASOS ABS: 0 10*3/uL (ref 0.0–0.2)
Basos: 0 %
EOS (ABSOLUTE): 0.1 10*3/uL (ref 0.0–0.4)
Eos: 2 %
HEMOGLOBIN: 14 g/dL (ref 11.1–15.9)
Hematocrit: 41 % (ref 34.0–46.6)
Immature Grans (Abs): 0 10*3/uL (ref 0.0–0.1)
Immature Granulocytes: 0 %
LYMPHS ABS: 1.6 10*3/uL (ref 0.7–3.1)
Lymphs: 26 %
MCH: 30.4 pg (ref 26.6–33.0)
MCHC: 34.1 g/dL (ref 31.5–35.7)
MCV: 89 fL (ref 79–97)
MONOCYTES: 4 %
MONOS ABS: 0.2 10*3/uL (ref 0.1–0.9)
NEUTROS ABS: 4 10*3/uL (ref 1.4–7.0)
Neutrophils: 68 %
Platelets: 151 10*3/uL (ref 150–379)
RBC: 4.6 x10E6/uL (ref 3.77–5.28)
RDW: 13.8 % (ref 12.3–15.4)
WBC: 6 10*3/uL (ref 3.4–10.8)

## 2017-12-09 LAB — T4, FREE: Free T4: 1.12 ng/dL (ref 0.82–1.77)

## 2017-12-09 LAB — TSH: TSH: 1.67 u[IU]/mL (ref 0.450–4.500)

## 2017-12-09 LAB — MAGNESIUM: Magnesium: 1.9 mg/dL (ref 1.6–2.3)

## 2017-12-09 LAB — COMPREHENSIVE METABOLIC PANEL
A/G RATIO: 1.6 (ref 1.2–2.2)
ALBUMIN: 4.6 g/dL (ref 3.6–4.8)
ALK PHOS: 87 IU/L (ref 39–117)
ALT: 34 IU/L — ABNORMAL HIGH (ref 0–32)
AST: 35 IU/L (ref 0–40)
BILIRUBIN TOTAL: 0.6 mg/dL (ref 0.0–1.2)
BUN / CREAT RATIO: 14 (ref 12–28)
BUN: 10 mg/dL (ref 8–27)
CHLORIDE: 98 mmol/L (ref 96–106)
CO2: 27 mmol/L (ref 20–29)
Calcium: 9.9 mg/dL (ref 8.7–10.3)
Creatinine, Ser: 0.74 mg/dL (ref 0.57–1.00)
GFR calc non Af Amer: 85 mL/min/{1.73_m2} (ref >59)
GFR, EST AFRICAN AMERICAN: 98 mL/min/{1.73_m2} (ref >59)
GLOBULIN, TOTAL: 2.8 g/dL (ref 1.5–4.5)
Glucose: 76 mg/dL (ref 65–99)
POTASSIUM: 4 mmol/L (ref 3.5–5.2)
SODIUM: 139 mmol/L (ref 134–144)
TOTAL PROTEIN: 7.4 g/dL (ref 6.0–8.5)

## 2017-12-09 LAB — HEMOGLOBIN A1C
Est. average glucose Bld gHb Est-mCnc: 117 mg/dL
Hgb A1c MFr Bld: 5.7 % — ABNORMAL HIGH (ref 4.8–5.6)

## 2017-12-09 LAB — LIPID PANEL
CHOL/HDL RATIO: 2.2 ratio (ref 0.0–4.4)
CHOLESTEROL TOTAL: 170 mg/dL (ref 100–199)
HDL: 77 mg/dL (ref >39)
LDL CALC: 73 mg/dL (ref 0–99)
TRIGLYCERIDES: 102 mg/dL (ref 0–149)
VLDL CHOLESTEROL CAL: 20 mg/dL (ref 5–40)

## 2017-12-09 LAB — MICROALBUMIN / CREATININE URINE RATIO
CREATININE, UR: 93.5 mg/dL
Microalb/Creat Ratio: <3.2 mg/g creat (ref 0.0–30.0)
Microalbumin, Urine: <3 ug/mL

## 2017-12-09 LAB — PHOSPHORUS: Phosphorus: 3 mg/dL (ref 2.5–4.5)

## 2017-12-09 LAB — VITAMIN D 25 HYDROXY (VIT D DEFICIENCY, FRACTURES): VIT D 25 HYDROXY: 49.1 ng/mL (ref 30.0–100.0)

## 2017-12-17 ENCOUNTER — Encounter: Payer: Self-pay | Admitting: Family Medicine

## 2017-12-27 ENCOUNTER — Telehealth: Payer: Self-pay | Admitting: Family Medicine

## 2017-12-27 NOTE — Telephone Encounter (Signed)
Patient called to request these medicines be now sent to [facility].  1)insulin glargine (LANTUS) 100 UNIT/ML injection [[id number]]  Order Details  Dose: 60 Units Route: Subcutaneous Frequency: Every morning - 10a  Dispense Quantity: -- Refills: -- Fills remaining: --        Sig: Inject 60 Units into the skin every morning.      2) famotidine (PEPCID) 20 MG tablet [[id number]]  Order Details  Dose: 20 mg Route: Oral Frequency: 2 times daily  Dispense Quantity: -- Refills: -- Fills remaining: --        Sig: Take 20 mg by mouth 2 (two) times daily.     3)  insulin lispro (HUMALOG KWIKPEN) 100 UNIT/ML Mayer Masker [[id number]]  Order Details  Dose: 20 Units Route: Subcutaneous Frequency: --  Dispense Quantity: -- Refills: -- Fills remaining: --        Sig: Inject 20 Units into the skin. Daily with largest meal      4)  insulin lispro (HUMALOG KWIKPEN) 100 UNIT/ML Mayer Masker [[id number]]  Order Details  Dose: 20 Units Route: Subcutaneous Frequency: --  Dispense Quantity: -- Refills: -- Fills remaining: --        Sig: Inject 20 Units into the skin. Daily with largest meal      5)  gabapentin (NEURONTIN) 300 MG capsule [[id number]]  Order Details  Dose: 1,800 mg Route: Oral Frequency: 3 times daily  Indications of Use: Diabetic Neuropathy, Pt. does not require refill at this time. Please just put on file.  Dispense Quantity: 360 capsule Refills: 3 Fills remaining: --        Sig: Take 6 capsules (1,800 mg total) by mouth 3 (three) times daily.         --glh

## 2017-12-28 ENCOUNTER — Other Ambulatory Visit: Payer: Self-pay

## 2017-12-28 NOTE — Telephone Encounter (Signed)
Patient called requesting refills on lantus, pepcid, and humalog. Medications were last filled by a previous provider. Request sent to Dr. Raliegh Scarlet to review. Patient's last office visit 12/08/2017. MPulliam, CMA/RT(R)

## 2017-12-28 NOTE — Telephone Encounter (Signed)
Medications last filled by previous provider. Sent request to Dr. Raliegh Scarlet to review. MPulliam, CMA/RT(R)

## 2017-12-29 MED ORDER — INSULIN LISPRO 100 UNIT/ML (KWIKPEN): 20.0000 [IU] | PEN_INJECTOR | Freq: Every day | SUBCUTANEOUS | 1 refills | Status: DC

## 2017-12-29 MED ORDER — INSULIN GLARGINE 100 UNIT/ML ~~LOC~~ SOLN: 60.0000 [IU] | Freq: Every morning | SUBCUTANEOUS | 1 refills | Status: DC

## 2017-12-29 MED ORDER — FAMOTIDINE 20 MG PO TABS: 20.0000 mg | ORAL_TABLET | Freq: Two times a day (BID) | ORAL | 1 refills | Status: DC

## 2018-01-10 DIAGNOSIS — Z1231 Encounter for screening mammogram for malignant neoplasm of breast: Secondary | ICD-10-CM | POA: Diagnosis not present

## 2018-01-16 DIAGNOSIS — J329 Chronic sinusitis, unspecified: Secondary | ICD-10-CM | POA: Diagnosis not present

## 2018-01-24 ENCOUNTER — Ambulatory Visit (INDEPENDENT_AMBULATORY_CARE_PROVIDER_SITE_OTHER): Payer: Medicare Other | Admitting: Family Medicine

## 2018-01-24 ENCOUNTER — Encounter: Payer: Self-pay | Admitting: Family Medicine

## 2018-01-24 VITALS — BP 119/77 | HR 65 | Ht 67.0 in | Wt 230.0 lb

## 2018-01-24 DIAGNOSIS — I152 Hypertension secondary to endocrine disorders: Secondary | ICD-10-CM

## 2018-01-24 DIAGNOSIS — C801 Malignant (primary) neoplasm, unspecified: Secondary | ICD-10-CM

## 2018-01-24 DIAGNOSIS — E0821 Diabetes mellitus due to underlying condition with diabetic nephropathy: Secondary | ICD-10-CM | POA: Diagnosis not present

## 2018-01-24 DIAGNOSIS — I10 Essential (primary) hypertension: Secondary | ICD-10-CM | POA: Diagnosis not present

## 2018-01-24 DIAGNOSIS — Z794 Long term (current) use of insulin: Secondary | ICD-10-CM | POA: Diagnosis not present

## 2018-01-24 DIAGNOSIS — E1159 Type 2 diabetes mellitus with other circulatory complications: Secondary | ICD-10-CM | POA: Diagnosis not present

## 2018-01-24 DIAGNOSIS — E1169 Type 2 diabetes mellitus with other specified complication: Secondary | ICD-10-CM | POA: Diagnosis not present

## 2018-01-24 DIAGNOSIS — E1142 Type 2 diabetes mellitus with diabetic polyneuropathy: Secondary | ICD-10-CM

## 2018-01-24 DIAGNOSIS — E785 Hyperlipidemia, unspecified: Secondary | ICD-10-CM | POA: Diagnosis not present

## 2018-01-24 MED ORDER — LOSARTAN POTASSIUM 25 MG PO TABS: 25.0000 mg | ORAL_TABLET | Freq: Every day | ORAL | 1 refills | Status: DC

## 2018-01-24 MED ORDER — GABAPENTIN 300 MG PO CAPS: 1800.0000 mg | ORAL_CAPSULE | Freq: Three times a day (TID) | ORAL | 5 refills | Status: DC

## 2018-01-24 NOTE — Patient Instructions (Signed)
Please go up to 1800  3 times daily of your gabapentin.   Please call with any questions or concerns.   That was sent to [facility] for you.   RF losartan.  Mid March or so keep appt for your diabetes at that time we will get an A1c, B12 and folate level.  If you would like to come in a week before the office visit for lab only visit to get an A1c and the folate, B12 then we can have results at the office visit to discuss.  Going to go up on the Neurontin.  Please try to drink at least 7 bottles of water per day this should help to mask dramatically with your leg cramping.  We will also see in the labs if there are abnormalities.  Get back on [facility] for you and hubby!

## 2018-01-24 NOTE — Progress Notes (Signed)
Impression and Recommendations:    1. Hypertension associated with diabetes (Jasmine Davila)   2. Diabetic polyneuropathy associated with type 2 diabetes mellitus (Jasmine Davila)   3. Diabetes mellitus due to underlying condition with diabetic nephropathy, with long-term current use of insulin (Jasmine Davila)   4. Hyperlipidemia associated with type 2 diabetes mellitus (Jasmine Davila)   5. H/o Cancer:   Jasmine Davila (spleen and lung) - non-Hodgkin's    1. Neuropathy - Patient has a history of chemotherapy.  Reviewed in detail with the patient today that this is likely the cause of her neuropathy.  Reminded the patient that this type of pain can be difficult to resolve, but we will do our best to alleviate it.  - Discussed at length the ways her pain could be exacerbated, including her confounding chronic issues of diabetes and back pain.  Reviewed the risks and benefits of multiple methods of pain alleviation with the patient.  Reviewed the importance of fully increasing her Neurontin dose, and discussed the incorporation of other medications (such as Cymbalta or Elavil).    - The pattient failed Elavil and Cymbalta in the past due to grogginess and sleepiness during the day.  Lyrica was too expensive.  - Patient agrees to max out on her Neurontin dose, at 1800, three times daily (18 caps per day).  Patient agrees to take the full dose as prescribed this time.  Reviewed with the patient that feeling tired or nauseous can be side-effects of the increased Neurontin dose.  - In the future, if needed, we can consider another alternate neuropathic pain medicine for use at night.  - Given her ongoing pain, patient is willing to obtain another EMG study in the future.  - Reviewed that since her weight is up, this increased weight will contribute to increased pain in her legs.  Patient should continue as planned with resuming Weight Watchers along with her husband.  Advised and recommended incorporating the use of free apps like  LoseIt to help monitor caloric intake.  - Patient should drink 7 bottles of water per day - at least half of the patient's body weight in ounces of water per day.  2. Prescriptions & Follow-Up - Neurontin dose, 1800, three times daily (18 caps per day).  Patient knows to call if she has any questions or concerns.  - She would like her prescription of Losartan (Cozaar) re-sent to [facility].  Her other refills went through fine.  - Patient is scheduled for labs on 03/08/2018.  She will be returning for follow up in a month, on 03/16/2018.    - The patient knows to call us if anything happens in the meantime.   Orders Placed This Encounter  Procedures  . HM Diabetes Foot Exam    Meds ordered this encounter  Medications  . losartan (COZAAR) 25 MG tablet    Sig: Take 1 tablet (25 mg total) by mouth daily.    Dispense:  90 tablet    Refill:  1  . gabapentin (NEURONTIN) 300 MG capsule    Sig: Take 6 capsules (1,800 mg total) by mouth 3 (three) times daily.    Dispense:  540 capsule    Refill:  5    Gross side effects, risk and benefits, and alternatives of medications and treatment plan in general discussed with patient.  Patient is aware that all medications have potential side effects and we are unable to predict every side effect or drug-drug interaction that may occur.  Patient will call with any questions prior to using medication if they have concerns.  Expresses verbal understanding and consents to current therapy and treatment regimen.  No barriers to understanding were identified.  Red flag symptoms and signs discussed in detail.  Patient expressed understanding regarding what to do in case of emergency\urgent symptoms  Please see AVS handed out to patient at the end of our visit for further patient instructions/ counseling done pertaining to today's office visit.   No Follow-up on file.    Note: This note was prepared with assistance of Dragon voice recognition software.  Occasional wrong-word or sound-a-like substitutions may have occurred due to the inherent limitations of voice recognition software.   This document serves as a record of services personally performed by [name], DO. It was created on her behalf by Toni Amend, a trained medical scribe. The creation of this record is based on the scribe's personal observations and the provider's statements to them.   I have reviewed the above medical documentation for accuracy and completeness and I concur.  [name] 01/24/18 3:31 PM   --------------------------------------------------------------------------------------------------------------------------------------------------------------------------------------------------------------   Subjective:     HPI: Jasmine Davila is a 67 y.o. female who presents to [place] at [facility] today for issues as discussed below.  Neuropathy & Increased Neurontin Dose She is here today to discuss how she's been doing these past 6 weeks on her increased dose of neurontin.  However, at the start of the appointment, the patient notes that she has not been taking the increased dose as prescribed.    Instead of taking 1800, three times day as prescribed last appointment (six pills), she has only been taking four pills, three times a day.   She was previously taking three pills, three times a day, so she did increase by one extra pill of Neurontin per dose.  On the mild increase, her pain is still terrible.  Patient remarks that her feet both feel "dead" from the ankle down.  Comments that the pain level is probably a 9/10.  Toward the end of the day, the pain is "around a 12" because her feet are burning so badly.  At that time, it feels like her feet are no longer flat, and "cupped" instead.  The pain sometimes feels stabbing, even when she is lying down to sleep at night.    Whenever she sits in her recliner, her feet  are up and elevated.  She also elevates her feet at night.  In the past, adding Elavil at night did not help alleviate her nerve pain.  The Elavil made her feel very groggy during the day.  She also historically tried Cymbalta.  Lyrica was too expensive for the patient to try.  Notes back pain at times that comes and goes.  It depends on how much she "works" (she describes yard work, Engineer, materials).  This pain occurs more centrally in the lower back.  Patient notes that she went years ago for nerve conduction studies, and is willing to revisit this.  She was first diagnosed with her neuropathy about 8 years ago, and had the previous study done at that time.  Notes that her weight is up right now.  She and her husband both have plans to resume Weight Watchers.  The plan was to resume watching their weight once the Super Bowl was over, which is today.   Wt Readings from Last 3 Encounters:  01/24/18 230 lb (104.3 kg)  12/08/17 221 lb (100.2 kg)  03/04/17 232 lb 3.2 oz (105.3 kg)   BP Readings from Last 3 Encounters:  01/24/18 119/77  12/08/17 123/78  03/04/17 117/73   Pulse Readings from Last 3 Encounters:  01/24/18 65  12/08/17 67  03/04/17 71   BMI Readings from Last 3 Encounters:  01/24/18 36.02 kg/m  12/08/17 34.61 kg/m  03/04/17 36.37 kg/m     Patient Care Team    Relationship Specialty Notifications Start End  [name], DO PCP - General Family Medicine  11/24/16   [name], MD Consulting Physician Oncology  11/24/16      Patient Active Problem List   Diagnosis Date Noted  . Diabetes mellitus due to underlying condition with diabetic nephropathy, with long-term current use of insulin ([place]) 11/25/2016    Priority: High  . Hypertension associated with diabetes ([place]) 11/24/2016    Priority: High  . H/o Cancer:   Jasmine Davila (spleen and lung) - non-Hodgkin's 11/24/2016    Priority: High  . Diabetic neuropathy - exaccerbated by chemo 11/24/2016     Priority: High  . Hyperlipidemia associated with type 2 diabetes mellitus ([place]) 11/24/2016    Priority: High  . BMI 40.0-44.9, adult ([place]) 11/25/2016    Priority: Medium  . Smoking hx 11/24/2016    Priority: Medium  . Chronic pancreatitis ([place]) 11/24/2016    Priority: Medium  . Vitamin B6 deficiency (non anemic) 11/24/2016    Priority: Medium  . GERD (gastroesophageal reflux disease) 11/24/2016    Priority: Low  . B12 deficiency 11/24/2016    Priority: Low  . Sleep apnea with CPAP 12/29/2016  . History of decreased platelet count 12/29/2016    Past Medical history, Surgical history, Family history, Social history, Allergies and Medications have been entered into the medical record, reviewed and changed as needed.    Current Meds  Medication Sig  . aspirin EC 81 MG tablet Take 81 mg by mouth daily.  . Cholecalciferol (VITAMIN D-3) 5000 units TABS Take 1 tablet by mouth daily.  . cyanocobalamin (,VITAMIN Jasmine12,) 1000 MCG/ML injection Inject 0.5 mLs (500 mcg total) into the muscle every 30 (thirty) days.  . famotidine (PEPCID) 20 MG tablet Take 1 tablet (20 mg total) by mouth 2 (two) times daily.  . folic acid (FOLVITE) 269 MCG tablet Take 400 mcg by mouth daily.  Marland Kitchen gabapentin (NEURONTIN) 300 MG capsule Take 6 capsules (1,800 mg total) by mouth 3 (three) times daily.  . insulin glargine (LANTUS) 100 UNIT/ML injection Inject 0.6 mLs (60 Units total) into the skin every morning.  . insulin lispro (HUMALOG KWIKPEN) 100 UNIT/ML KiwkPen Inject 0.2 mLs (20 Units total) into the skin daily. Daily with largest meal  . losartan (COZAAR) 25 MG tablet Take 1 tablet (25 mg total) by mouth daily.  . Multiple Vitamin (MULTIVITAMIN) tablet Take 1 tablet by mouth daily.  . niacin (NIASPAN) 1000 MG CR tablet Take 1 tablet (1,000 mg total) by mouth at bedtime. An hour after 81 mg ASA  . pyridoxine (B6 NATURAL) 100 MG tablet Take 200 mg by mouth daily.  . [DISCONTINUED] gabapentin (NEURONTIN) 300 MG  capsule Take 6 capsules (1,800 mg total) by mouth 3 (three) times daily.  . [DISCONTINUED] losartan (COZAAR) 25 MG tablet Take 25 mg by mouth daily.    Allergies:  Allergies  Allergen Reactions  . Dilaudid [Hydromorphone Hcl] Other (See Comments)    anxious  . Doxycycline Other (See Comments)    Pancreatic issues  .  Hydromorphone   . Metronidazole Other (See Comments)    Pt doesn't recall  . Pseudoephedrine Hcl      Review of Systems:  A fourteen system review of systems was performed and found to be positive as per HPI.   Objective:   Blood pressure 119/77, pulse 65, height 5\' 7"  (1.702 m), weight 230 lb (104.3 kg), SpO2 98 %. Body mass index is 36.02 kg/m. General:  Well Developed, well nourished, appropriate for stated age.  Neuro:  Alert and oriented,  extra-ocular muscles intact  HEENT:  Normocephalic, atraumatic, neck supple, no carotid bruits appreciated  Skin:  no gross rash, warm, pink. Cardiac:  RRR, S1 S2 Respiratory:  ECTA B/L and A/P, Not using accessory muscles, speaking in full sentences- unlabored. Vascular:  Ext warm, no cyanosis apprec.; cap RF less 2 sec. Psych:  No HI/SI, judgement and insight good, Euthymic mood. Full Affect.

## 2018-01-25 ENCOUNTER — Telehealth: Payer: Self-pay | Admitting: Family Medicine

## 2018-01-25 NOTE — Telephone Encounter (Signed)
Pharmacy rep called from [facility] [phone] to request nurse/ provider confirm dosage for Gabapentin. ---glh

## 2018-01-25 NOTE — Telephone Encounter (Signed)
Called and verified with the pharmacy the dosage. MPulliam, CMA/RT(R)

## 2018-03-03 DIAGNOSIS — Z9221 Personal history of antineoplastic chemotherapy: Secondary | ICD-10-CM | POA: Diagnosis not present

## 2018-03-03 DIAGNOSIS — Z8572 Personal history of non-Hodgkin lymphomas: Secondary | ICD-10-CM | POA: Diagnosis not present

## 2018-03-03 DIAGNOSIS — C8338 Diffuse large B-cell lymphoma, lymph nodes of multiple sites: Secondary | ICD-10-CM | POA: Diagnosis not present

## 2018-03-03 LAB — CBC AND DIFFERENTIAL
HEMATOCRIT: 40 (ref 36–46)
HEMATOCRIT: 40 (ref 36–46)
Hemoglobin: 13.8 (ref 12.0–16.0)
Hemoglobin: 13.8 (ref 12.0–16.0)
PLATELETS: 141 — AB (ref 150–399)
PLATELETS: 141 — AB (ref 150–399)
WBC: 4.9
WBC: 4.9

## 2018-03-03 LAB — HEPATIC FUNCTION PANEL
ALK PHOS: 77 (ref 25–125)
ALK PHOS: 77 (ref 25–125)
ALT: 36 — AB (ref 7–35)
AST: 53 — AB (ref 13–35)
BILIRUBIN, TOTAL: 0.8
BILIRUBIN, TOTAL: 0.8

## 2018-03-03 LAB — BASIC METABOLIC PANEL
BUN: 13 (ref 4–21)
BUN: 13 (ref 4–21)
Creatinine: 0.6 (ref 0.5–1.1)
Creatinine: 0.6 (ref <=1.1)
GLUCOSE: 90
Glucose: 90
POTASSIUM: 4 (ref 3.4–5.3)
Potassium: 4 (ref 3.4–5.3)
SODIUM: 134 — AB (ref 137–147)
Sodium: 134 — AB (ref 137–147)

## 2018-03-08 ENCOUNTER — Other Ambulatory Visit: Payer: Medicare Other

## 2018-03-09 ENCOUNTER — Other Ambulatory Visit (INDEPENDENT_AMBULATORY_CARE_PROVIDER_SITE_OTHER): Payer: Medicare Other

## 2018-03-09 DIAGNOSIS — C801 Malignant (primary) neoplasm, unspecified: Secondary | ICD-10-CM | POA: Diagnosis not present

## 2018-03-09 DIAGNOSIS — E0821 Diabetes mellitus due to underlying condition with diabetic nephropathy: Secondary | ICD-10-CM | POA: Diagnosis not present

## 2018-03-09 DIAGNOSIS — E1169 Type 2 diabetes mellitus with other specified complication: Secondary | ICD-10-CM | POA: Diagnosis not present

## 2018-03-09 DIAGNOSIS — E1142 Type 2 diabetes mellitus with diabetic polyneuropathy: Secondary | ICD-10-CM | POA: Diagnosis not present

## 2018-03-09 DIAGNOSIS — E538 Deficiency of other specified B group vitamins: Secondary | ICD-10-CM | POA: Diagnosis not present

## 2018-03-09 DIAGNOSIS — E1159 Type 2 diabetes mellitus with other circulatory complications: Secondary | ICD-10-CM

## 2018-03-09 DIAGNOSIS — I10 Essential (primary) hypertension: Secondary | ICD-10-CM

## 2018-03-09 DIAGNOSIS — E785 Hyperlipidemia, unspecified: Secondary | ICD-10-CM | POA: Diagnosis not present

## 2018-03-09 DIAGNOSIS — I152 Hypertension secondary to endocrine disorders: Secondary | ICD-10-CM

## 2018-03-09 DIAGNOSIS — Z794 Long term (current) use of insulin: Secondary | ICD-10-CM | POA: Diagnosis not present

## 2018-03-10 LAB — HEMOGLOBIN A1C
ESTIMATED AVERAGE GLUCOSE: 131 mg/dL
HEMOGLOBIN A1C: 6.2 % — AB (ref 4.8–5.6)

## 2018-03-10 LAB — B12 AND FOLATE PANEL
Folate: >20 ng/mL (ref >3.0)
Vitamin B-12: 719 pg/mL (ref 232–1245)

## 2018-03-16 ENCOUNTER — Encounter: Payer: Self-pay | Admitting: Family Medicine

## 2018-03-16 ENCOUNTER — Ambulatory Visit (INDEPENDENT_AMBULATORY_CARE_PROVIDER_SITE_OTHER): Payer: Medicare Other | Admitting: Family Medicine

## 2018-03-16 VITALS — BP 139/79 | HR 67 | Ht 67.0 in | Wt 236.0 lb

## 2018-03-16 DIAGNOSIS — E0821 Diabetes mellitus due to underlying condition with diabetic nephropathy: Secondary | ICD-10-CM

## 2018-03-16 DIAGNOSIS — B351 Tinea unguium: Secondary | ICD-10-CM | POA: Insufficient documentation

## 2018-03-16 DIAGNOSIS — E785 Hyperlipidemia, unspecified: Secondary | ICD-10-CM | POA: Diagnosis not present

## 2018-03-16 DIAGNOSIS — E1169 Type 2 diabetes mellitus with other specified complication: Secondary | ICD-10-CM | POA: Diagnosis not present

## 2018-03-16 DIAGNOSIS — E1142 Type 2 diabetes mellitus with diabetic polyneuropathy: Secondary | ICD-10-CM | POA: Diagnosis not present

## 2018-03-16 DIAGNOSIS — C801 Malignant (primary) neoplasm, unspecified: Secondary | ICD-10-CM

## 2018-03-16 DIAGNOSIS — Z794 Long term (current) use of insulin: Secondary | ICD-10-CM | POA: Diagnosis not present

## 2018-03-16 DIAGNOSIS — M792 Neuralgia and neuritis, unspecified: Secondary | ICD-10-CM | POA: Diagnosis not present

## 2018-03-16 DIAGNOSIS — I152 Hypertension secondary to endocrine disorders: Secondary | ICD-10-CM

## 2018-03-16 DIAGNOSIS — I10 Essential (primary) hypertension: Secondary | ICD-10-CM | POA: Diagnosis not present

## 2018-03-16 DIAGNOSIS — E1159 Type 2 diabetes mellitus with other circulatory complications: Secondary | ICD-10-CM | POA: Diagnosis not present

## 2018-03-16 MED ORDER — GABAPENTIN 300 MG PO CAPS: 1200.0000 mg | ORAL_CAPSULE | Freq: Three times a day (TID) | ORAL | 5 refills | Status: DC

## 2018-03-16 NOTE — Progress Notes (Signed)
Impression and Recommendations:    1. Hypertension associated with diabetes ([place])   2. Diabetic polyneuropathy associated with type 2 diabetes mellitus ([place])   3. Diabetes mellitus due to underlying condition with diabetic nephropathy, with long-term current use of insulin ([place])   4. Morbid obesity ([place])   5. H/o Cancer:   B- cell lymphoma (spleen and lung) - non-Hodgkin's   6. Onychomycosis   7. Neuropathic pain syndrome (non-herpetic)   8. Hyperlipidemia associated with type 2 diabetes mellitus ([place])      1. Folate & B12 Lab Review - Came in early to get A1c, folate, and B12 labs. - Assessed these levels for evaluation of neuropathic issues.  - B12 was up prior - now back where it should be. - Folate level is totally normal.  - Reviewed that since these levels are normal, the better her sugars are controlled, the better her neuropathy will be.   2. Diabetes Mellitus - A1c is up from last value (5.7) but still reasonable at 6.2.   - Under great control for someone her age.  Reviewed that staying under 6.5 is ideal. - Blood pressure looks good today.   - Continue medications as prescribed  Referral to [facility] - Patient agrees to begin obtaining yearly diabetic foot care.    3. Neuropathy Gabapentin Use - Last appointment, maxed out patient's Neurontin dose, at 1800, three times daily (18 caps per day).  Per patient, full dose had no effect.  She is back down to 4 capsules of gabapentin, 3 times a day (12 caps per day).    - Patient believes that the script needs to be rewritten back down to four; she did not get the script filled with the six.  Ongoing Pain - Patient has a history of chemotherapy.  Reviewed in detail with the patient today that this is likely the cause of her neuropathy.  Reminded the patient that this type of pain can be difficult to resolve, but we will do our best to alleviate it.  - Discussed at length the ways her pain could be exacerbated,  including her confounding chronic issues of diabetes and back pain.  Reviewed the risks and benefits of multiple methods of pain alleviation with the patient.  Reviewed the failure of fully increasing her Neurontin dose, and discussed the incorporation of other medications (such as Cymbalta or Elavil).    - The pattient failed Elavil and Cymbalta in the past due to grogginess and sleepiness during the day.  Lyrica was too expensive.  - Discussed that if she desires further treatment, we can send her to neurology or a chronic pain specialist.    Referral to Neurology for Diabetic Neuropathy - Patient would like to see a nerve specialist at this time.  - Given her ongoing pain, patient is willing to obtain another EMG study in the future.  - Patient is cautious about incorporating additional medicines, but if needed, we can consider another alternate neuropathic pain medicine for use at night.     4. Exercise & Health Management Explained to patient what BMI refers to, and what it means medically.    Told patient to think about it as a "medical risk stratification measurement" and how increasing BMI is associated with increasing risk/ or worsening state of various diseases such as hypertension, hyperlipidemia, diabetes, premature OA, depression etc.  American Heart Association guidelines for healthy diet, basically Mediterranean diet, and exercise guidelines of 30 minutes 5 days per week or  more discussed in detail.  Health counseling performed.  All questions answered.  - Reviewed that as her weight continues to go up, this increased weight will contribute to increased pain in her feet and legs.  Patient should continue as planned with resuming Weight Watchers along with her husband.  Advised and recommended incorporating the use of free apps like LoseIt to help monitor caloric intake.  - Advised patient to continue working toward exercising to improve health.  Recommended that the patient  eventually strive for at least 150 minutes of cardio per week according to the [facility].    - Patient may consider swimming as a form of exercise that may not exacerbate her foot pain.  - Healthy dietary habits encouraged, including low-carb, and high amounts of lean protein in diet.   - Patient should drink 7 bottles of water per day - at least half of the patient's body weight in ounces of water per day.   5. Prescriptions & Follow-Up - Neurontin dose will be written back down to 4 caps, 3 times daily (12 caps per day).   - Continue on all medications (diabetes, blood pressure, supplements) as prescribed. - Two referrals placed today (podiatry and neurology).  - Patient knows to call if she has any questions or concerns.  - The patient knows to call us if anything happens in the meantime.    Education and routine counseling performed. Handouts provided.  Orders Placed This Encounter  Procedures  . Ambulatory referral to Neurology  . Ambulatory referral to [place] ordered this encounter  Medications  . gabapentin (NEURONTIN) 300 MG capsule    Sig: Take 4 capsules (1,200 mg total) by mouth 3 (three) times daily.    Dispense:  360 capsule    Refill:  5    Return in about 3 months (around 06/16/2018) for DM, HTN, HLD follow up every 67mo.   The patient was counseled, risk factors were discussed, anticipatory guidance given.  Gross side effects, risk and benefits, and alternatives of medications discussed with patient.  Patient is aware that all medications have potential side effects and we are unable to predict every side effect or drug-drug interaction that may occur.  Expresses verbal understanding and consents to current therapy plan and treatment regimen.  Please see AVS handed out to patient at the end of our visit for further patient instructions/ counseling done pertaining to today's office visit.    Note: This document was prepared using Dragon voice  recognition software and may include unintentional dictation errors.  This document serves as a record of services personally performed by [name], DO. It was created on her behalf by Toni Amend, a trained medical scribe. The creation of this record is based on the scribe's personal observations and the provider's statements to them.   I have reviewed the above medical documentation for accuracy and completeness and I concur.  [name] 03/16/18 11:34 AM     Subjective:    Chief Complaint  Patient presents with  . Follow-up    Jasmine Davila is a 67 y.o. female who presents to [place] at [facility] today for Diabetes Management.    Neuropathy Since prior appointment, patient notes desire to discuss her gabapentin.  She felt no benefit on max dose, so went back down on her dose.  Notes that her neuropathy is still horrible and she can't do everything she'd like to do.  Her feet feel like they  are on fire, "like a bad toothache."  Notes her feet hurt after she's done a lot of activity during the day.  The more she's up on her feet, the worse her neuropathy is.  Walks around barefoot because it feels better at night.  Could go and walk in the snow and it feels good because her feet are so hot.  Does not believe that her back is playing a role in her foot pain.  When she gets back aches, she does not think that her feet feel worse.  Notes that her feet have always felt the same.  "I've never really gotten relief."  Gabapentin gave her relief in the beginning, but "all bets are off now."  Both feet hurt; the whole bottoms.  Her toes "feel like they could break off all the time."  Reports that the pain goes up halfway on her shins.  Used to go up to her knees in the past.  Patient is open to swimming as an exercise in place of walking.  DM HPI: -  She has been working on diet and exercise for diabetes  Pt is currently maintained on the  following medications for diabetes:   see med list today  Medication compliance - Compliant with all medications   Denies polyuria/polydipsia. Denies hypo/ hyperglycemia symptoms - She denies new onset of: chest pain, exercise intolerance, shortness of breath, dizziness, visual changes, headache, lower extremity swelling or claudication.   Last diabetic eye exam was No results found for: HMDIABEYEEXA  Foot exam- UTD  Last A1C in the office was:  Lab Results  Component Value Date   HGBA1C 6.2 (H) 03/09/2018   HGBA1C 5.7 (H) 12/08/2017   HGBA1C 5.7 12/08/2017    Lab Results  Component Value Date   MICROALBUR 10 03/04/2017   [place] 73 12/08/2017   CREATININE 0.74 12/08/2017     Last 3 blood pressure readings in our office are as follows: BP Readings from Last 3 Encounters:  03/16/18 139/79  01/24/18 119/77  12/08/17 123/78    BMI Readings from Last 3 Encounters:  03/16/18 36.96 kg/m  01/24/18 36.02 kg/m  12/08/17 34.61 kg/m     Problem  Morbid Obesity ([facility])  Onychomycosis  Neuropathic Pain Syndrome (Non-Herpetic)      Patient Care Team    Relationship Specialty Notifications Start End  [name], DO PCP - General Family Medicine  11/24/16   [name], MD Consulting Physician Oncology  11/24/16      Patient Active Problem List   Diagnosis Date Noted  . Diabetes mellitus due to underlying condition with diabetic nephropathy, with long-term current use of insulin ([place]) 11/25/2016    Priority: High  . Hypertension associated with diabetes ([place]) 11/24/2016    Priority: High  . H/o Cancer:   B- cell lymphoma (spleen and lung) - non-Hodgkin's 11/24/2016    Priority: High  . Diabetic neuropathy - exaccerbated by chemo 11/24/2016    Priority: High  . Hyperlipidemia associated with type 2 diabetes mellitus ([place]) 11/24/2016    Priority: High  . Morbid obesity ([place]) 11/25/2016    Priority: Medium  . Smoking hx 11/24/2016    Priority: Medium   . Chronic pancreatitis ([place]) 11/24/2016    Priority: Medium  . Vitamin B6 deficiency (non anemic) 11/24/2016    Priority: Medium  . GERD (gastroesophageal reflux disease) 11/24/2016    Priority: Low  . B12 deficiency 11/24/2016    Priority: Low  . Onychomycosis 03/16/2018  . Neuropathic  pain syndrome (non-herpetic) 03/16/2018  . Sleep apnea with CPAP 12/29/2016  . History of decreased platelet count 12/29/2016     Past Medical History:  Diagnosis Date  . Acute pancreatitis   . Cancer ([place])    B cell lymphoma- Non Hodgkins in remission  . Diabetic neuropathy ([place])   . Hypertension      Past Surgical History:  Procedure Laterality Date  . CHOLECYSTECTOMY    . MEDIPORT INSERTION, DOUBLE    . MEDIPORT REMOVAL       Family History  Problem Relation Age of Onset  . Cancer Mother        colon  . Heart attack Father   . Heart disease Sister   . Alcohol abuse Maternal Grandfather      Social History   Substance and Sexual Activity  Drug Use No  ,  Social History   Substance and Sexual Activity  Alcohol Use No  ,  Social History   Tobacco Use  Smoking Status Former Smoker  . Packs/day: 0.50  . Years: 4.00  . Pack years: 2.00  . Types: Cigarettes  . Last attempt to quit: 12/21/1968  . Years since quitting: 49.2  Smokeless Tobacco Never Used  ,    Current Outpatient Medications on File Prior to Visit  Medication Sig Dispense Refill  . aspirin EC 81 MG tablet Take 81 mg by mouth daily.    . Cholecalciferol (VITAMIN D-3) 5000 units TABS Take 1 tablet by mouth daily.    . cyanocobalamin (,VITAMIN B-12,) 1000 MCG/ML injection Inject 0.5 mLs (500 mcg total) into the muscle every 30 (thirty) days. 10 mL 1  . famotidine (PEPCID) 20 MG tablet Take 1 tablet (20 mg total) by mouth 2 (two) times daily. 376 tablet 1  . folic acid (FOLVITE) 283 MCG tablet Take 400 mcg by mouth daily.    . insulin glargine (LANTUS) 100 UNIT/ML injection Inject 0.6 mLs (60 Units total)  into the skin every morning. 30 mL 1  . insulin lispro (HUMALOG KWIKPEN) 100 UNIT/ML KiwkPen Inject 0.2 mLs (20 Units total) into the skin daily. Daily with largest meal 15 mL 1  . losartan (COZAAR) 25 MG tablet Take 1 tablet (25 mg total) by mouth daily. 90 tablet 1  . Multiple Vitamin (MULTIVITAMIN) tablet Take 1 tablet by mouth daily.    . niacin (NIASPAN) 1000 MG CR tablet Take 1 tablet (1,000 mg total) by mouth at bedtime. An hour after 81 mg ASA 90 tablet 3  . pyridoxine (B6 NATURAL) 100 MG tablet Take 200 mg by mouth daily.     No current facility-administered medications on file prior to visit.      Allergies  Allergen Reactions  . Dilaudid [Hydromorphone Hcl] Other (See Comments)    anxious  . Doxycycline Other (See Comments)    Pancreatic issues  . Hydromorphone   . Metronidazole Other (See Comments)    Pt doesn't recall  . Pseudoephedrine Hcl      Review of Systems:   General:  Denies fever, chills Optho/Auditory:   Denies visual changes, blurred vision Respiratory:   Denies SOB, cough, wheeze, DIB  Cardiovascular:   Denies chest pain, palpitations, painful respirations Gastrointestinal:   Denies nausea, vomiting, diarrhea.  Endocrine:     Denies new hot or cold intolerance Musculoskeletal:  Denies joint swelling, gait issues, or new unexplained myalgias/ arthralgias Skin:  Denies rash, suspicious lesions  Neurological:    Denies dizziness, unexplained weakness, numbness  Psychiatric/Behavioral:  Denies mood changes    Objective:     Blood pressure 139/79, pulse 67, height 5\' 7"  (1.702 m), weight 236 lb (107 kg), SpO2 100 %. Body mass index is 36.96 kg/m. General: Well Developed, well nourished, and in no acute distress.  HEENT: Normocephalic, atraumatic, pupils equal round reactive to light, neck supple, No carotid bruits, no JVD Skin: Warm and dry, cap RF less 2 sec Cardiac: Regular rate and rhythm, S1, S2 WNL's, no murmurs rubs or gallops Respiratory:  ECTA B/L, Not using accessory muscles, speaking in full sentences. NeuroM-Sk: Ambulates w/o assistance, moves ext * 4 w/o difficulty, sensation grossly intact.  Ext: scant edema b/l lower ext Psych: No HI/SI, judgement and insight good, Euthymic mood. Full Affect.

## 2018-03-16 NOTE — Patient Instructions (Addendum)
[employer] [facility], maintenance (IT) in [place]  -Please see your discharge summary for the referral to be sent into neurology as well as podiatry today.  -Your primary care provider I can recommend weight loss which will help your joint and your neuropathic pains.   Please get back at your healthier eating habits as 90% of weight loss has to do with what we eat.  Weight watchers is a great program in general as you know.     -We will see you in 3 months to follow your blood pressures, blood sugars etc.     Behavior Modification Ideas for Weight Management  Weight management involves adopting a healthy lifestyle that includes a knowledge of nutrition and exercise, a positive attitude and the right kind of motivation. Internal motives such as better health, increased energy, self-esteem and personal control increase your chances of lifelong weight management success.  Remember to have realistic goals and think long-term success. Believe in yourself and you can do it. The following information will give you ideas to help you meet your goals.  Control Your Home Environment  Eat only while sitting down at the kitchen or dining room table. Do not eat while watching television, reading, cooking, talking on the phone, standing at the refrigerator or working on the computer. Keep tempting foods out of the house -- don't buy them. Keep tempting foods out of sight. Have low-calorie foods ready to eat. Unless you are preparing a meal, stay out of the kitchen. Have healthy snacks at your disposal, such as small pieces of fruit, vegetables, canned fruit, pretzels, low-fat string cheese and nonfat cottage cheese.  Control Your Work Environment  Do not eat at [employer] or keep tempting snacks at your desk. If you get hungry between meals, plan healthy snacks and bring them with you to work. During your breaks, go for a walk instead of eating. If you work around food, plan in advance the one item  you will eat at mealtime. Make it inconvenient to nibble on food by chewing gum, sugarless candy or drinking water or another low-calorie beverage. Do not work through meals. Skipping meals slows down metabolism and may result in overeating at the next meal. If food is available for special occasions, either pick the healthiest item, nibble on low-fat snacks brought from home, don't have anything offered, choose one option and have a small amount, or have only a beverage.  Control Your Mealtime Environment  Serve your plate of food at the stove or kitchen counter. Do not put the serving dishes on the table. If you do put dishes on the table, remove them immediately when finished eating. Fill half of your plate with vegetables, a quarter with lean protein and a quarter with starch. Use smaller plates, bowls and glasses. A smaller portion will look large when it is in a little dish. Politely refuse second helpings. When fixing your plate, limit portions of food to one scoop/serving or less.   Daily Food Management  Replace eating with another activity that you will not associate with food. Wait 20 minutes before eating something you are craving. Drink a large glass of water or diet soda before eating. Always have a big glass or bottle of water to drink throughout the day. Avoid high-calorie add-ons such as cream with your coffee, butter, mayonnaise and salad dressings.  Shopping: Do not shop when hungry or tired. Shop from a list and avoid buying anything that is not on your list.  If you must have tempting foods, buy individual-sized packages and try to find a lower-calorie alternative. Don't taste test in the store. Read food labels. Compare products to help you make the healthiest choices.  Preparation: Chew a piece of gum while cooking meals. Use a quarter teaspoon if you taste test your food. Try to only fix what you are going to eat, leaving yourself no chance for seconds. If you  have prepared more food than you need, portion it into individual containers and freeze or refrigerate immediately. Don't snack while cooking meals.  Eating: Eat slowly. Remember it takes about 20 minutes for your stomach to send a message to your brain that it is full. Don't let fake hunger make you think you need more. The ideal way to eat is to take a bite, put your utensil down, take a sip of water, cut your next bite, take a bit, put your utensil down and so on. Do not cut your food all at one time. Cut only as needed. Take small bites and chew your food well. Stop eating for a minute or two at least once during a meal or snack. Take breaks to reflect and have conversation.  Cleanup and Leftovers: Label leftovers for a specific meal or snack. Freeze or refrigerate individual portions of leftovers. Do not clean up if you are still hungry.  Eating Out and Social Eating  Do not arrive hungry. Eat something light before the meal. Try to fill up on low-calorie foods, such as vegetables and fruit, and eat smaller portions of the high-calorie foods. Eat foods that you like, but choose small portions. If you want seconds, wait at least 20 minutes after you have eaten to see if you are actually hungry or if your eyes are bigger than your stomach. Limit alcoholic beverages. Try a soda water with a twist of lime. Do not skip other meals in the day to save room for the special event.  At Restaurants: Order  la carte rather than buffet style. Order some vegetables or a salad for an appetizer instead of eating bread. If you order a high-calorie dish, share it with someone. Try an after-dinner mint with your coffee. If you do have dessert, share it with two or more people. Don't overeat because you do not want to waste food. Ask for a doggie bag to take extra food home. Tell the server to put half of your entree in a to go bag before the meal is served to you. Ask for salad dressing, gravy or  high-fat sauces on the side. Dip the tip of your fork in the dressing before each bite. If bread is served, ask for only one piece. Try it plain without butter or oil. At [employer] where oil and vinegar is served with bread, use only a small amount of oil and a lot of vinegar for dipping.  At a Friend's House: Offer to bring a dish, appetizer or dessert that is low in calories. Serve yourself small portions or tell the host that you only want a small amount. Stand or sit away from the snack table. Stay away from the kitchen or stay busy if you are near the food. Limit your alcohol intake.  At [employer] and Cafeterias: Cover most of your plate with lettuce and/or vegetables. Use a salad plate instead of a dinner plate. After eating, clear away your dishes before having coffee or tea.  Entertaining at Home: Explore low-fat, low-cholesterol cookbooks. Use single-serving foods like chicken  breasts or hamburger patties. Prepare low-calorie appetizers and desserts.   Holidays: Keep tempting foods out of sight. Decorate the house without using food. Have low-calorie beverages and foods on hand for guests. Allow yourself one planned treat a day. Don't skip meals to save up for the holiday feast. Eat regular, planned meals.   Exercise Well  Make exercise a priority and a planned activity in the day. If possible, walk the entire or part of the distance to work. Get an exercise buddy. Go for a walk with a colleague during one of your breaks, go to the gym, run or take a walk with a friend, walk in the mall with a shopping companion. Park at the end of the parking lot and walk to the store or office entrance. Always take the stairs all of the way or at least part of the way to your floor. If you have a desk job, walk around the office frequently. Do leg lifts while sitting at your desk. Do something outside on the weekends like going for a hike or a bike ride.   Have a Healthy  Attitude  Make health your weight management priority. Be realistic. Have a goal to achieve a healthier you, not necessarily the lowest weight or ideal weight based on calculations or tables. Focus on a healthy eating style, not on dieting. Dieting usually lasts for a short amount of time and rarely produces long-term success. Think long term. You are developing new healthy behaviors to follow next month, in a year and in a decade.    This information is for educational purposes only and is not intended to replace the advice of your doctor or health care provider. We encourage you to discuss with your doctor any questions or concerns you may have.        Guidelines for Losing Weight   We want weight loss that will last so you should lose 1-2 pounds a week.  THAT IS IT! Please pick THREE things a month to change. Once it is a habit check off the item. Then pick another three items off the list to become habits.  If you are already doing a habit on the list GREAT!  Cross that item off!  Dont drink your calories. Ie, alcohol, soda, fruit juice, and sweet tea.   Drink more water. Drink a glass when you feel hungry or before each meal.   Eat breakfast - Complex carb and protein (likeDannon light and fit yogurt, oatmeal, fruit, eggs, [place] bacon).  Measure your cereal.  Eat no more than one cup a day. (ie Kashi)  Eat an apple a day.  Add a vegetable a day.  Try a new vegetable a month.  Use Pam! Stop using oil or butter to cook.  Dont finish your plate or use smaller plates.  Share your dessert.  Eat sugar free Jello for dessert or frozen grapes.  Dont eat 2-3 hours before bed.  Switch to whole wheat bread, pasta, and brown rice.  Make healthier choices when you eat out. No fries!  Pick baked chicken, NOT fried.  Dont forget to SLOW DOWN when you eat. It is not going anywhere.   Take the stairs.  Park far away in the parking lot  Lift soup cans (or weights) for  10 minutes while watching TV.  Walk at work for 10 minutes during break.  Walk outside 1 time a week with your friend, kids, dog, or significant other.  Start a walking group  at church.  Walk the mall as much as you can tolerate.   Keep a food diary.  Weigh yourself daily.  Walk for 15 minutes 3 days per week.  Cook at home more often and eat out less. If life happens and you go back to old habits, it is okay.  Just start over. You can do it!  If you experience chest pain, get short of breath, or tired during the exercise, please stop immediately and inform your doctor.    Before you even begin to attack a weight-loss plan, it pays to remember this: You are not fat. You have fat. Losing weight isn't about blame or shame; it's simply another achievement to accomplish. Dieting is like any other skill--you have to buckle down and work at it. As long as you act in a smart, reasonable way, you'll ultimately get where you want to be. Here are some weight loss pearls for you.   1. It's Not a Diet. It's a Lifestyle Thinking of a diet as something you're on and suffering through only for the short term doesn't work. To shed weight and keep it off, you need to make permanent changes to the way you eat. It's OK to indulge occasionally, of course, but if you cut calories temporarily and then revert to your old way of eating, you'll gain back the weight quicker than you can say yo-yo. Use it to lose it. Research shows that one of the best predictors of long-term weight loss is how many pounds you drop in the first month. For that reason, nutritionists often suggest being stricter for the first two weeks of your new eating strategy to build momentum. Cut out added sugar and alcohol and avoid unrefined carbs. After that, figure out how you can reincorporate them in a way that's healthy and maintainable.  2. There's a Right Way to Exercise Working out burns calories and fat and boosts your metabolism by  building muscle. But those trying to lose weight are notorious for overestimating the number of calories they burn and underestimating the amount they take in. Unfortunately, your system is biologically programmed to hold on to extra pounds and that means when you start exercising, your body senses the deficit and ramps up its hunger signals. If you're not diligent, you'll eat everything you burn and then some. Use it, to lose it. Cardio gets all the exercise glory, but strength and interval training are the real heroes. They help you build lean muscle, which in turn increases your metabolism and calorie-burning ability 3. Don't Overreact to Mild Hunger Some people have a hard time losing weight because of hunger anxiety. To them, being hungry is bad--something to be avoided at all costs--so they carry snacks with them and eat when they don't need to. Others eat because they're stressed out or bored. While you never want to get to the point of being ravenous (that's when bingeing is likely to happen), a hunger pang, a craving, or the fact that it's 3:00 p.m. should not send you racing for the vending machine or obsessing about the energy bar in your purse. Ideally, you should put off eating until your stomach is growling and it's difficult to concentrate.  Use it to lose it. When you feel the urge to eat, use the HALT method. Ask yourself, Am I really hungry? Or am I angry or anxious, lonely or bored, or tired? If you're still not certain, try the apple test. If you're truly hungry, an apple  should seem delicious; if it doesn't, something else is going on. Or you can try drinking water and making yourself busy, if you are still hungry try a healthy snack.  4. Not All Calories Are Created Equal The mechanics of weight loss are pretty simple: Take in fewer calories than you use for energy. But the kind of food you eat makes all the difference. Processed food that's high in saturated fat and refined starch or  sugar can cause inflammation that disrupts the hormone signals that tell your brain you're full. The result: You eat a lot more.  Use it to lose it. Clean up your diet. Swap in whole, unprocessed foods, including vegetables, lean protein, and healthy fats that will fill you up and give you the biggest nutritional bang for your calorie buck. In a few weeks, as your brain starts receiving regular hunger and fullness signals once again, you'll notice that you feel less hungry overall and naturally start cutting back on the amount you eat.  5. Protein, Produce, and Plant-Based Fats Are Your Weight-Loss Trinity Here's why eating the three Ps regularly will help you drop pounds. Protein fills you up. You need it to build lean muscle, which keeps your metabolism humming so that you can torch more fat. People in a weight-loss program who ate double the recommended daily allowance for protein (about 110 grams for a 150-pound woman) lost 70 percent of their weight from fat, while people who ate the RDA lost only about 40 percent, one study found. Produce is packed with filling fiber. "It's very difficult to consume too many calories if you're eating a lot of vegetables. Example: Three cups of broccoli is a lot of food, yet only 93 calories. (Fruit is another story. It can be easy to overeat and can contain a lot of calories from sugar, so be sure to monitor your intake.) Plant-based fats like olive oil and those in avocados and nuts are healthy and extra satiating.  Use it to lose it. Aim to incorporate each of the three Ps into every meal and snack. People who eat protein throughout the day are able to keep weight off, according to a study in the [place] of Clinical Nutrition. In addition to meat, poultry and seafood, good sources are beans, lentils, eggs, tofu, and yogurt. As for fat, keep portion sizes in check by measuring out salad dressing, oil, and nut butters (shoot for one to two tablespoons).  Finally, eat veggies or a little fruit at every meal. People who did that consumed 308 fewer calories but didn't feel any hungrier than when they didn't eat more produce.  7. How You Eat Is As Important As What You Eat In order for your brain to register that you're full, you need to focus on what you're eating. Sit down whenever you eat, preferably at a table. Turn off the TV or computer, put down your phone, and look at your food. Smell it. Chew slowly, and don't put another bite on your fork until you swallow. When women ate lunch this attentively, they consumed 30 percent less when snacking later than those who listened to an audiobook at lunchtime, according to a study in the [place] of Nutrition. 8. Weighing Yourself Really Works The scale provides the best evidence about whether your efforts are paying off. Seeing the numbers tick up or down or stagnate is motivation to keep going--or to rethink your approach. A 2015 study at [facility] found that daily weigh-ins helped  people lose more weight, keep it off, and maintain that loss, even after two years. Use it to lose it. Step on the scale at the same time every day for the best results. If your weight shoots up several pounds from one weigh-in to the next, don't freak out. Eating a lot of salt the night before or having your period is the likely culprit. The number should return to normal in a day or two. It's a steady climb that you need to do something about. 9. Too Much Stress and Too Little Sleep Are Your Enemies When you're tired and frazzled, your body cranks up the production of cortisol, the stress hormone that can cause carb cravings. Not getting enough sleep also boosts your levels of ghrelin, a hormone associated with hunger, while suppressing leptin, a hormone that signals fullness and satiety. People on a diet who slept only five and a half hours a night for two weeks lost 55 percent less fat and were hungrier than those  who slept eight and a half hours, according to a study in the [place]. Use it to lose it. Prioritize sleep, aiming for seven hours or more a night, which research shows helps lower stress. And make sure you're getting quality zzz's. If a snoring spouse or a fidgety cat wakes you up frequently throughout the night, you may end up getting the equivalent of just four hours of sleep, according to a study from [facility]. Keep pets out of the bedroom, and use a white-noise app to drown out snoring. 10. You Will Hit a plateau--And You Can Bust Through It As you slim down, your body releases much less leptin, the fullness hormone.  If you're not strength training, start right now. Building muscle can raise your metabolism to help you overcome a plateau. To keep your body challenged and burning calories, incorporate new moves and more intense intervals into your workouts or add another sweat session to your weekly routine. Alternatively, cut an extra 100 calories or so a day from your diet. Now that you've lost weight, your body simply doesn't need as much fuel.    Since food equals calories, in order to lose weight you must either eat fewer calories, exercise more to burn off calories with activity, or both. Food that is not used to fuel the body is stored as fat. A major component of losing weight is to make smarter food choices. Here's how:  1)   Limit non-nutritious foods, such as: Sugar, honey, syrups and candy Pastries, donuts, pies, cakes and cookies Soft drinks, sweetened juices and alcoholic beverages  2)  Cut down on high-fat foods by: - Choosing poultry, fish or lean red meat - Choosing low-fat cooking methods, such as baking, broiling, steaming, grilling and boiling - Using low-fat or non-fat dairy products - Using vinaigrette, herbs, lemon or fat-free salad dressings - Avoiding fatty meats, such as bacon, sausage, franks, ribs and luncheon meats -  Avoiding high-fat snacks like nuts, chips and chocolate - Avoiding fried foods - Using less butter, margarine, oil and mayonnaise - Avoiding high-fat gravies, cream sauces and cream-based soups  3) Eat a variety of foods, including: - Fruit and vegetables that are raw, steamed or baked - Whole grains, breads, cereal, rice and pasta - Dairy products, such as low-fat or non-fat milk or yogurt, low-fat cottage cheese and low-fat cheese - Protein-rich foods like chicken, [place], fish, lean meat and legumes, or beans  4) Change your eating  habits by: - Eat three balanced meals a day to help control your hunger - Watch portion sizes and eat small servings of a variety of foods - Choose low-calorie snacks - Eat only when you are hungry and stop when you are satisfied - Eat slowly and try not to perform other tasks while eating - Find other activities to distract you from food, such as walking, taking up a hobby or being involved in the community - Include regular exercise in your daily routine ( minimum of 20 min of moderate-intensity exercise at least 5 days/week)  - Find a support group, if necessary, for emotional support in your weight loss journey           Easy ways to cut 100 calories   1. Eat your eggs with hot sauce OR salsa instead of cheese.  Eggs are great for breakfast, but many people consider eggs and cheese to be BFFs. Instead of cheese--1 oz. of cheddar has 114 calories--top your eggs with hot sauce, which contains no calories and helps with satiety and metabolism. Salsa is also a great option!!  2. Top your toast, waffles or pancakes with fresh berries instead of jelly or syrup. Half a cup of berries--fresh, frozen or thawed--has about 40 calories, compared with 2 tbsp. of maple syrup or jelly, which both have about 100 calories. The berries will also give you a good punch of fiber, which helps keep you full and satisfied and wont spike blood sugar quickly like the  jelly or syrup. 3. Swap the non-fat latte for black coffee with a splash of half-and-half. Contrary to its name, that non-fat latte has 130 calories and a startling 19g of carbohydrates per 16 oz. serving. Replacing that light drinkable dessert with a black coffee with a splash of half-and-half saves you more than 100 calories per 16 oz. serving. 4. Sprinkle salads with freeze-dried raspberries instead of dried cranberries. If you want a sweet addition to your nutritious salad, stay away from dried cranberries. They have a whopping 130 calories per  cup and 30g carbohydrates. Instead, sprinkle freeze-dried raspberries guilt-free and save more than 100 calories per  cup serving, adding 3g of belly-filling fiber. 5. Go for mustard in place of mayo on your sandwich. Mustard can add really nice flavor to any sandwich, and there are tons of varieties, from spicy to honey. A serving of mayo is 95 calories, versus 10 calories in a serving of mustard.  Or try an avocado mayo spread: You can find the recipe few click this link: https://www.californiaavocado.com/recipes/recipe-container/[place]-avocado-mayo 6. Choose a DIY salad dressing instead of the store-bought kind. Mix Dijon or whole grain mustard with low-fat Kefir or red wine vinegar and garlic. 7. Use hummus as a spread instead of a dip. Use hummus as a spread on a high-fiber cracker or tortilla with a sandwich and save on calories without sacrificing taste. 8. Pick just one salad accessory. Salad isnt automatically a calorie winner. Its easy to over-accessorize with toppings. Instead of topping your salad with nuts, avocado and cranberries (all three will clock in at 313 calories), just pick one. The next day, choose a different accessory, which will also keep your salad interesting. You dont wear all your jewelry every day, right? 9. Ditch the white pasta in favor of spaghetti squash. One cup of cooked spaghetti squash has about 40  calories, compared with traditional spaghetti, which comes with more than 200. Spaghetti squash is also nutrient-dense. Its a good source of fiber and  Vitamins A and C, and it can be eaten just like you would eat pasta--with a great tomato sauce and [place] meatballs or with pesto, tofu and spinach, for example. 10. Dress up your chili, soups and stews with non-fat [place] yogurt instead of sour cream. Just a dollop of sour cream can set you back 115 calories and a whopping 12g of fat--seven of which are of the artery-clogging variety. Added bonus: [place] yogurt is packed with muscle-building protein, calcium and B Vitamins. 11. Mash cauliflower instead of mashed potatoes. One cup of traditional mashed potatoes--in all their creamy goodness--has more than 200 calories, compared to mashed cauliflower, which you can typically eat for less than 100 calories per 1 cup serving. Cauliflower is a great source of the antioxidant indole-3-carbinol (I3C), which may help reduce the risk of some cancers, like breast cancer. 12. Ditch the ice cream sundae in favor of a [place] yogurt parfait. Instead of a cup of ice cream or fro-yo for dessert, try 1 cup of nonfat Greek yogurt topped with fresh berries and a sprinkle of cacao nibs. Both toppings are packed with antioxidants, which can help reduce cellular inflammation and oxidative damage. And the comparison is a no-brainer: One cup of ice cream has about 275 calories; one cup of frozen yogurt has about 230; and a cup of Greek yogurt has just 130, plus twice the protein, so youre less likely to return to the freezer for a second helping. 13. Put olive oil in a spray container instead of using it directly from the bottle. Each tablespoon of olive oil is 120 calories and 15g of fat. Use a mister instead of pouring it straight into the pan or onto a salad. This allows for portion control and will save you more than 100 calories. 14. When baking, substitute canned pumpkin for  butter or oil. Canned pumpkin--not pumpkin pie mix--is loaded with Vitamin A, which is important for skin and eye health, as well as immunity. And the comparisons are pretty crazy:  cup of canned pumpkin has about 40 calories, compared to butter or oil, which has more than 800 calories. Yes, 800 calories. Applesauce and mashed banana can also serve as good substitutions for butter or oil, usually in a 1:1 ratio. 15. Top casseroles with high-fiber cereal instead of breadcrumbs. Breadcrumbs are typically made with white bread, while breakfast cereals contain 5-9g of fiber per serving. Not only will you save more than 150 calories per  cup serving, the swap will also keep you more full and youll get a metabolism boost from the added fiber. 16. Snack on pistachios instead of macadamia nuts. Believe it or not, you get the same amount of calories from 35 pistachios (100 calories) as you would from only five macadamia nuts. 17. Chow down on kale chips rather than potato chips. This is my favorite dont knock it till you try it swap. Kale chips are so easy to make at home, and you can spice them up with a little grated parmesan or chili powder. Plus, theyre a mere fraction of the calories of potato chips, but with the same crunch factor we crave so often. 18. Add seltzer and some fruit slices to your cocktail instead of soda or fruit juice. One cup of soda or fruit juice can pack on as much as 140 calories. Instead, use seltzer and fruit slices. The fruit provides valuable phytochemicals, such as flavonoids and anthocyanins, which help to combat cancer and stave off the aging process.

## 2018-03-22 ENCOUNTER — Encounter: Payer: Self-pay | Admitting: Neurology

## 2018-04-04 ENCOUNTER — Ambulatory Visit (INDEPENDENT_AMBULATORY_CARE_PROVIDER_SITE_OTHER): Payer: Medicare Other | Admitting: Podiatry

## 2018-04-04 ENCOUNTER — Encounter: Payer: Self-pay | Admitting: Podiatry

## 2018-04-04 VITALS — BP 141/79 | HR 75

## 2018-04-04 DIAGNOSIS — E0843 Diabetes mellitus due to underlying condition with diabetic autonomic (poly)neuropathy: Secondary | ICD-10-CM

## 2018-04-04 MED ORDER — NONFORMULARY OR COMPOUNDED ITEM: 2 refills | Status: DC

## 2018-04-05 ENCOUNTER — Other Ambulatory Visit: Payer: Self-pay

## 2018-04-05 DIAGNOSIS — E538 Deficiency of other specified B group vitamins: Secondary | ICD-10-CM

## 2018-04-05 MED ORDER — CYANOCOBALAMIN 1000 MCG/ML IJ SOLN: 500.0000 ug | INTRAMUSCULAR | 1 refills | Status: DC

## 2018-04-06 ENCOUNTER — Other Ambulatory Visit: Payer: Self-pay | Admitting: Family Medicine

## 2018-04-08 NOTE — Progress Notes (Signed)
   HPI: 67 year old female with PMHx of T2DM presenting today as a new patient with a chief complaint of worsening neuropathy since having chemotherapy in 2016. She states she has been taking 1200 mg of Gabapentin three times daily with no significant relief. She has an appointment with neurology in August. There are no modifying factors noted. Patient is here for further evaluation and treatment.   Past Medical History:  Diagnosis Date  . Acute pancreatitis   . Cancer ([place])    B cell lymphoma- Non Hodgkins in remission  . Diabetic neuropathy ([place])   . Hypertension      Physical Exam: General: The patient is alert and oriented x3 in no acute distress.  Dermatology: Skin is warm, dry and supple bilateral lower extremities. Negative for open lesions or macerations.  Vascular: Palpable pedal pulses bilaterally. No edema or erythema noted. Capillary refill within normal limits.  Neurological: Epicritic and protective threshold diminished bilaterally.   Musculoskeletal Exam: Range of motion within normal limits to all pedal and ankle joints bilateral. Muscle strength 5/5 in all groups bilateral.   Assessment: 1. DM with neuropathy   Plan of Care:  1. Patient evaluated.  2. Recommended good shoe gear and not going barefoot.  3. Prescription for neuropathy pain cream to be dispensed from [facility].  4. Return to clinic yearly.       Edrick Kins, DPM [facility]  Dr. Edrick Kins, DPM    [address], [place] [address]                Office [phone]  Fax [phone]

## 2018-04-13 ENCOUNTER — Encounter (HOSPITAL_BASED_OUTPATIENT_CLINIC_OR_DEPARTMENT_OTHER): Payer: Self-pay | Admitting: *Deleted

## 2018-04-13 ENCOUNTER — Inpatient Hospital Stay (HOSPITAL_BASED_OUTPATIENT_CLINIC_OR_DEPARTMENT_OTHER)
Admission: EM | Admit: 2018-04-13 | Discharge: 2018-04-15 | DRG: 440 | Disposition: A | Discharge status: Home / Self Care | Payer: Medicare Other | Attending: Internal Medicine | Admitting: Internal Medicine

## 2018-04-13 ENCOUNTER — Other Ambulatory Visit: Payer: Self-pay

## 2018-04-13 ENCOUNTER — Emergency Department (HOSPITAL_BASED_OUTPATIENT_CLINIC_OR_DEPARTMENT_OTHER): Payer: Medicare Other

## 2018-04-13 DIAGNOSIS — E0821 Diabetes mellitus due to underlying condition with diabetic nephropathy: Secondary | ICD-10-CM

## 2018-04-13 DIAGNOSIS — R109 Unspecified abdominal pain: Secondary | ICD-10-CM | POA: Diagnosis not present

## 2018-04-13 DIAGNOSIS — I10 Essential (primary) hypertension: Secondary | ICD-10-CM | POA: Diagnosis present

## 2018-04-13 DIAGNOSIS — Z9049 Acquired absence of other specified parts of digestive tract: Secondary | ICD-10-CM | POA: Diagnosis not present

## 2018-04-13 DIAGNOSIS — Z800 Family history of malignant neoplasm of digestive organs: Secondary | ICD-10-CM

## 2018-04-13 DIAGNOSIS — Z8249 Family history of ischemic heart disease and other diseases of the circulatory system: Secondary | ICD-10-CM

## 2018-04-13 DIAGNOSIS — Z862 Personal history of diseases of the blood and blood-forming organs and certain disorders involving the immune mechanism: Secondary | ICD-10-CM

## 2018-04-13 DIAGNOSIS — Z7982 Long term (current) use of aspirin: Secondary | ICD-10-CM

## 2018-04-13 DIAGNOSIS — E1140 Type 2 diabetes mellitus with diabetic neuropathy, unspecified: Secondary | ICD-10-CM | POA: Diagnosis present

## 2018-04-13 DIAGNOSIS — G4730 Sleep apnea, unspecified: Secondary | ICD-10-CM | POA: Diagnosis present

## 2018-04-13 DIAGNOSIS — R9389 Abnormal findings on diagnostic imaging of other specified body structures: Secondary | ICD-10-CM | POA: Diagnosis not present

## 2018-04-13 DIAGNOSIS — E538 Deficiency of other specified B group vitamins: Secondary | ICD-10-CM | POA: Diagnosis present

## 2018-04-13 DIAGNOSIS — Z794 Long term (current) use of insulin: Secondary | ICD-10-CM

## 2018-04-13 DIAGNOSIS — Z8579 Personal history of other malignant neoplasms of lymphoid, hematopoietic and related tissues: Secondary | ICD-10-CM | POA: Diagnosis not present

## 2018-04-13 DIAGNOSIS — K859 Acute pancreatitis without necrosis or infection, unspecified: Secondary | ICD-10-CM | POA: Diagnosis not present

## 2018-04-13 DIAGNOSIS — D696 Thrombocytopenia, unspecified: Secondary | ICD-10-CM | POA: Diagnosis present

## 2018-04-13 DIAGNOSIS — K861 Other chronic pancreatitis: Secondary | ICD-10-CM | POA: Diagnosis present

## 2018-04-13 DIAGNOSIS — C801 Malignant (primary) neoplasm, unspecified: Secondary | ICD-10-CM

## 2018-04-13 DIAGNOSIS — R079 Chest pain, unspecified: Secondary | ICD-10-CM | POA: Diagnosis not present

## 2018-04-13 DIAGNOSIS — Z8572 Personal history of non-Hodgkin lymphomas: Secondary | ICD-10-CM

## 2018-04-13 DIAGNOSIS — Z9221 Personal history of antineoplastic chemotherapy: Secondary | ICD-10-CM | POA: Diagnosis not present

## 2018-04-13 DIAGNOSIS — K8500 Idiopathic acute pancreatitis without necrosis or infection: Secondary | ICD-10-CM | POA: Diagnosis not present

## 2018-04-13 DIAGNOSIS — R1013 Epigastric pain: Secondary | ICD-10-CM | POA: Diagnosis not present

## 2018-04-13 LAB — CBC WITH DIFFERENTIAL/PLATELET
Basophils Absolute: 0 10*3/uL (ref 0.0–0.1)
Basophils Relative: 0 %
EOS PCT: 1 %
Eosinophils Absolute: 0.1 10*3/uL (ref 0.0–0.7)
HCT: 39.1 % (ref 36.0–46.0)
HEMOGLOBIN: 13.8 g/dL (ref 12.0–15.0)
LYMPHS PCT: 16 %
Lymphs Abs: 1.3 10*3/uL (ref 0.7–4.0)
MCH: 30.9 pg (ref 26.0–34.0)
MCHC: 35.3 g/dL (ref 30.0–36.0)
MCV: 87.5 fL (ref 78.0–100.0)
MONOS PCT: 5 %
Monocytes Absolute: 0.4 10*3/uL (ref 0.1–1.0)
NEUTROS PCT: 78 %
Neutro Abs: 6.6 10*3/uL (ref 1.7–7.7)
Platelets: 138 10*3/uL — ABNORMAL LOW (ref 150–400)
RBC: 4.47 MIL/uL (ref 3.87–5.11)
RDW: 13 % (ref 11.5–15.5)
WBC: 8.4 10*3/uL (ref 4.0–10.5)

## 2018-04-13 LAB — COMPREHENSIVE METABOLIC PANEL
ALK PHOS: 71 U/L (ref 38–126)
ALT: 32 U/L (ref 14–54)
ANION GAP: 7 (ref 5–15)
AST: 35 U/L (ref 15–41)
Albumin: 3.9 g/dL (ref 3.5–5.0)
BUN: 12 mg/dL (ref 6–20)
CALCIUM: 8.9 mg/dL (ref 8.9–10.3)
CO2: 28 mmol/L (ref 22–32)
CREATININE: 0.74 mg/dL (ref 0.44–1.00)
Chloride: 97 mmol/L — ABNORMAL LOW (ref 101–111)
GFR calc non Af Amer: >60 mL/min (ref >60)
Glucose, Bld: 132 mg/dL — ABNORMAL HIGH (ref 65–99)
Potassium: 4.1 mmol/L (ref 3.5–5.1)
Sodium: 132 mmol/L — ABNORMAL LOW (ref 135–145)
Total Bilirubin: 0.5 mg/dL (ref 0.3–1.2)
Total Protein: 7.2 g/dL (ref 6.5–8.1)

## 2018-04-13 LAB — TRIGLYCERIDES: Triglycerides: 68 mg/dL (ref <150)

## 2018-04-13 LAB — TROPONIN I: Troponin I: <0.03 ng/mL (ref <0.03)

## 2018-04-13 LAB — LIPASE, BLOOD: LIPASE: 2457 U/L — AB (ref 11–51)

## 2018-04-13 MED ORDER — ONDANSETRON HCL 4 MG/2ML IJ SOLN
4.0000 mg | Freq: Once | INTRAMUSCULAR | Status: AC
  Administered 2018-04-13: 4 mg via INTRAVENOUS

## 2018-04-13 MED ORDER — INSULIN ASPART 100 UNIT/ML ~~LOC~~ SOLN
0.0000 [IU] | SUBCUTANEOUS | Status: DC
  Administered 2018-04-15: 2 [IU] via SUBCUTANEOUS
  Administered 2018-04-15 (×2): 1 [IU] via SUBCUTANEOUS

## 2018-04-13 MED ORDER — MORPHINE SULFATE (PF) 10 MG/ML IV SOLN
10.0000 mg | Freq: Once | INTRAVENOUS | Status: AC
  Administered 2018-04-13: 10 mg via INTRAVENOUS
  Filled 2018-04-13: qty 1

## 2018-04-13 MED ORDER — ONDANSETRON HCL 4 MG/2ML IJ SOLN
INTRAMUSCULAR | Status: AC
  Filled 2018-04-13: qty 2

## 2018-04-13 MED ORDER — ACETAMINOPHEN 325 MG PO TABS: 650.0000 mg | ORAL_TABLET | Freq: Four times a day (QID) | ORAL | Status: DC | PRN

## 2018-04-13 MED ORDER — SODIUM CHLORIDE 0.9 % IV BOLUS
1000.0000 mL | Freq: Once | INTRAVENOUS | Status: AC
  Administered 2018-04-13: 1000 mL via INTRAVENOUS

## 2018-04-13 MED ORDER — INSULIN GLARGINE 100 UNIT/ML ~~LOC~~ SOLN
10.0000 [IU] | Freq: Every day | SUBCUTANEOUS | Status: DC
  Administered 2018-04-15: 10 [IU] via SUBCUTANEOUS
  Filled 2018-04-13 (×2): qty 0.1

## 2018-04-13 MED ORDER — ASPIRIN EC 81 MG PO TBEC
81.0000 mg | DELAYED_RELEASE_TABLET | Freq: Every day | ORAL | Status: DC
  Administered 2018-04-13 – 2018-04-14 (×2): 81 mg via ORAL
  Filled 2018-04-13 (×2): qty 1

## 2018-04-13 MED ORDER — MORPHINE SULFATE (PF) 4 MG/ML IV SOLN
4.0000 mg | Freq: Once | INTRAVENOUS | Status: AC
  Administered 2018-04-13: 4 mg via INTRAVENOUS
  Filled 2018-04-13: qty 1

## 2018-04-13 MED ORDER — ONDANSETRON HCL 4 MG PO TABS: 4.0000 mg | ORAL_TABLET | Freq: Four times a day (QID) | ORAL | Status: DC | PRN

## 2018-04-13 MED ORDER — ACETAMINOPHEN 650 MG RE SUPP: 650.0000 mg | Freq: Four times a day (QID) | RECTAL | Status: DC | PRN

## 2018-04-13 MED ORDER — IOPAMIDOL (ISOVUE-300) INJECTION 61%
100.0000 mL | Freq: Once | INTRAVENOUS | Status: AC | PRN
  Administered 2018-04-13: 100 mL via INTRAVENOUS

## 2018-04-13 MED ORDER — LACTATED RINGERS IV SOLN
INTRAVENOUS | Status: AC
  Administered 2018-04-13: 1000 mL via INTRAVENOUS
  Administered 2018-04-14: 16:00:00 via INTRAVENOUS
  Administered 2018-04-14: 1000 mL via INTRAVENOUS

## 2018-04-13 MED ORDER — HYDRALAZINE HCL 20 MG/ML IJ SOLN: 10.0000 mg | INTRAMUSCULAR | Status: DC | PRN

## 2018-04-13 MED ORDER — ONDANSETRON HCL 4 MG/2ML IJ SOLN
4.0000 mg | Freq: Four times a day (QID) | INTRAMUSCULAR | Status: DC | PRN
  Administered 2018-04-13: 4 mg via INTRAVENOUS
  Filled 2018-04-13: qty 2

## 2018-04-13 MED ORDER — ASPIRIN 81 MG PO CHEW
324.0000 mg | CHEWABLE_TABLET | Freq: Once | ORAL | Status: AC
  Administered 2018-04-13: 324 mg via ORAL
  Filled 2018-04-13: qty 4

## 2018-04-13 MED ORDER — NITROGLYCERIN 0.4 MG SL SUBL
0.4000 mg | SUBLINGUAL_TABLET | SUBLINGUAL | Status: DC | PRN
  Administered 2018-04-13 (×2): 0.4 mg via SUBLINGUAL
  Filled 2018-04-13: qty 1

## 2018-04-13 MED ORDER — MORPHINE SULFATE (PF) 2 MG/ML IV SOLN
2.0000 mg | INTRAVENOUS | Status: DC | PRN
  Administered 2018-04-13: 2 mg via INTRAVENOUS
  Filled 2018-04-13: qty 1

## 2018-04-13 NOTE — ED Notes (Signed)
Per EDP do not administer more nitro-pain remain 8/10

## 2018-04-13 NOTE — ED Provider Notes (Signed)
[facility] Provider Note   CSN: [id number] Arrival date & time: 04/13/18  1445     History   Chief Complaint Chief Complaint  Patient presents with  . Abdominal Pain    HPI Jasmine Davila is a 67 y.o. female.  HPI  Epigastric pain and chest pressure began about 6 hours ago [employer], pressure isn't going away Has been constant for the last 6 hours Nothing makes chest pressure better, is worse exertion 8/10 No nausea or vomiting, no sweating, no real shortness of breath History of pancreatitis in the past, 4 hospitalizations, doesn't feel like taht Left lower abdomen started hurting a few hours ago, feels like 6/10, pressure, going down the leg, radiating Hip pain and numbness for a few days  2008 had stress test, never had cath or seen cardiologist No hx of known heart disease  Started new cream for neuropathy, bupivacaine, doxepin, gabapentin, pentoxifyline, topiramate one week ago.  Has had pancreatitis 4 times, initially thought it was doxycycline but then thought was idiopathic last few times   Past Medical History:  Diagnosis Date  . Acute pancreatitis   . Cancer ([place])    B cell lymphoma- Non Hodgkins in remission  . Diabetic neuropathy ([place])   . Hypertension     Patient Active Problem List   Diagnosis Date Noted  . Acute pancreatitis 04/13/2018  . Onychomycosis 03/16/2018  . Neuropathic pain syndrome (non-herpetic) 03/16/2018  . Sleep apnea with CPAP 12/29/2016  . History of decreased platelet count 12/29/2016  . Morbid obesity ([place]) 11/25/2016  . Diabetes mellitus due to underlying condition with diabetic nephropathy, with long-term current use of insulin ([place]) 11/25/2016  . Smoking hx 11/24/2016  . Hypertension associated with diabetes ([place]) 11/24/2016  . Chronic pancreatitis ([place]) 11/24/2016  . 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 Diabetic  neuropathy - exaccerbated by chemo 11/24/2016  . GERD (gastroesophageal reflux disease) 11/24/2016  . Hyperlipidemia associated with type 2 diabetes mellitus ([place]) 11/24/2016  . B12 deficiency 11/24/2016  . Vitamin B6 deficiency (non anemic) 11/24/2016    Past Surgical History:  Procedure Laterality Date  . CHOLECYSTECTOMY    . MEDIPORT INSERTION, DOUBLE    . MEDIPORT REMOVAL       OB History   None      Home Medications    Prior to Admission medications   Medication Sig Start Date End Date Taking? Authorizing Provider  aspirin EC 81 MG tablet Take 81 mg by mouth at bedtime.    Yes [provider]  Cholecalciferol (VITAMIN D-3) 5000 units TABS Take 1 tablet by mouth daily.   Yes [provider]  famotidine (PEPCID) 20 MG tablet Take 1 tablet (20 mg total) by mouth 2 (two) times daily. 12/29/17  Yes Opalski, Neoma Laming, DO  folic acid (FOLVITE) 831 MCG tablet Take 400 mcg by mouth daily.   Yes [provider]  gabapentin (NEURONTIN) 300 MG capsule Take 4 capsules (1,200 mg total) by mouth 3 (three) times daily. 03/16/18  Yes Opalski, Neoma Laming, DO  insulin lispro (HUMALOG KWIKPEN) 100 UNIT/ML KiwkPen Inject 0.2 mLs (20 Units total) into the skin daily. Daily with largest meal 12/29/17  Yes Opalski, Deborah, DO  LANTUS SOLOSTAR 100 UNIT/ML Solostar Pen INJECT 60 UNITS INTO THE SKIN EVERY MORNING. 04/06/18  Yes Opalski, Deborah, DO  losartan (COZAAR) 25 MG tablet Take 1 tablet (25 mg total) by mouth daily. 01/24/18  Yes Opalski, Deborah, DO  Multiple Vitamin (MULTIVITAMIN) tablet Take 1 tablet by mouth daily.   Yes [provider]  niacin (NIASPAN) 1000 MG CR tablet Take 1 tablet (1,000 mg total) by mouth at bedtime. An hour after 81 mg ASA 12/29/16  Yes Opalski, Neoma Laming, DO  NONFORMULARY OR COMPOUNDED ITEM [facility]  Peripheral Neuropathy Cream- Bupivacaine 1%, Doxepin 3%, Gabapentin 6%, Pentoxifylline 3%, Topiramate 1% Apply 1-2 grams to affected area  3-4 times daily Qty. 120 gm 3 refills 04/04/18  Yes Edrick Kins, DPM  pyridoxine (B6 NATURAL) 100 MG tablet Take 200 mg by mouth daily.   Yes [provider]  cyanocobalamin (,VITAMIN B-12,) 1000 MCG/ML injection Inject 0.5 mLs (500 mcg total) into the muscle every 30 (thirty) days. 04/05/18   [name], DO    Family History Family History  Problem Relation Age of Onset  . Cancer Mother        colon  . Heart attack Father   . Heart disease Sister   . Alcohol abuse Maternal Grandfather     Social History Social History   Tobacco Use  . Smoking status: Former Smoker    Packs/day: 0.50    Years: 4.00    Pack years: 2.00    Types: Cigarettes    Last attempt to quit: 12/21/1968    Years since quitting: 49.3  . Smokeless tobacco: Never Used  Substance Use Topics  . Alcohol use: No  . Drug use: No     Allergies   Dilaudid [hydromorphone hcl]; Doxycycline; Hydromorphone; Metronidazole; and Pseudoephedrine hcl   Review of Systems Review of Systems  Constitutional: Negative for fever.  HENT: Negative for sore throat.   Eyes: Negative for visual disturbance.  Respiratory: Negative for cough and shortness of breath.   Cardiovascular: Positive for chest pain.  Gastrointestinal: Positive for abdominal pain. Negative for constipation, diarrhea, nausea and vomiting.  Genitourinary: Negative for difficulty urinating and dysuria.  Musculoskeletal: Positive for arthralgias. Negative for back pain and neck pain.  Skin: Negative for rash.  Neurological: Negative for syncope and headaches.     Physical Exam Updated Vital Signs BP 122/66 (BP Location: Right Arm)   Pulse (!) 58   Temp 97.6 F (36.4 C) (Oral)   Resp 16   Ht 5\' 7"  (1.702 m)   Wt 106.1 kg (234 lb)   SpO2 100%   BMI 36.65 kg/m   Physical Exam  Constitutional: She is oriented to person, place, and time. She appears well-developed and well-nourished. No distress.  HENT:  Head: Normocephalic and  atraumatic.  Eyes: Conjunctivae and EOM are normal.  Neck: Normal range of motion.  Cardiovascular: Normal rate, regular rhythm, normal heart sounds and intact distal pulses. Exam reveals no gallop and no friction rub.  No murmur heard. Pulmonary/Chest: Effort normal and breath sounds normal. No respiratory distress. She has no wheezes. She has no rales.  Abdominal: Soft. She exhibits no distension. There is no tenderness. There is no guarding.  Musculoskeletal: She exhibits no edema or tenderness.  Neurological: She is alert and oriented to person, place, and time.  Skin: Skin is warm and dry. No rash noted. She is not diaphoretic. No erythema.  Nursing note and vitals reviewed.    ED Treatments / Results  Labs (all labs ordered are listed, but only abnormal results are displayed) Labs Reviewed  CBC WITH DIFFERENTIAL/PLATELET - Abnormal; Notable for the following components:      Result Value   Platelets 138 (*)  All other components within normal limits  COMPREHENSIVE METABOLIC PANEL - Abnormal; Notable for the following components:   Sodium 132 (*)    Chloride 97 (*)    Glucose, Bld 132 (*)    All other components within normal limits  LIPASE, BLOOD - Abnormal; Notable for the following components:   Lipase 2,457 (*)    All other components within normal limits  URINALYSIS, ROUTINE W REFLEX MICROSCOPIC - Abnormal; Notable for the following components:   Specific Gravity, Urine >1.046 (*)    Leukocytes, UA TRACE (*)    All other components within normal limits  BASIC METABOLIC PANEL - Abnormal; Notable for the following components:   Chloride 99 (*)    Calcium 8.7 (*)    All other components within normal limits  CBC - Abnormal; Notable for the following components:   Platelets 124 (*)    All other components within normal limits  HEPATIC FUNCTION PANEL - Abnormal; Notable for the following components:   Albumin 3.4 (*)    AST 84 (*)    ALT 87 (*)    All other  components within normal limits  GLUCOSE, CAPILLARY - Abnormal; Notable for the following components:   Glucose-Capillary 110 (*)    All other components within normal limits  GLUCOSE, CAPILLARY - Abnormal; Notable for the following components:   Glucose-Capillary 113 (*)    All other components within normal limits  URINE CULTURE  TROPONIN I  TRIGLYCERIDES  TROPONIN I  GLUCOSE, CAPILLARY  GLUCOSE, CAPILLARY    EKG EKG Interpretation  Date/Time:  Wednesday April 13 2018 15:10:48 EDT Ventricular Rate:  73 PR Interval:    QRS Duration: 129 QT Interval:  418 QTC Calculation: 461 R Axis:   -62 Text Interpretation:  Sinus rhythm RBBB and LAFB Probable lateral infarct, old No previous ECGs available Confirmed by Gareth Morgan [phone]) on 04/13/2018 4:17:43 PM   Radiology Dg Chest 2 View  Result Date: 04/13/2018 CLINICAL DATA:  Patient with acute pancreatitis. Epigastric and chest pain for 6 hours today. EXAM: CHEST - 2 VIEW COMPARISON:  Single-view of the chest 02/15/2015. CT abdomen and pelvis today. FINDINGS: Lungs are clear. Heart size is normal. No pneumothorax or pleural fluid. Aortic atherosclerosis is noted. No acute bony abnormality. IMPRESSION: No acute disease. Atherosclerosis. Electronically Signed   By: Inge Rise M.D.   On: 04/13/2018 16:55   Ct Abdomen Pelvis W Contrast  Result Date: 04/13/2018 CLINICAL DATA:  67 year old female with a history of epigastric abdominal pain for 6 hours. History of B-cell lymphoma. History of pancreatitis EXAM: CT ABDOMEN AND PELVIS WITH CONTRAST TECHNIQUE: Multidetector CT imaging of the abdomen and pelvis was performed using the standard protocol following bolus administration of intravenous contrast. CONTRAST:  112mL ISOVUE-300 IOPAMIDOL (ISOVUE-300) INJECTION 61% COMPARISON:  CT 02/14/2015 FINDINGS: Lower chest: No acute finding of the lower chest Hepatobiliary: Uniform attenuation/enhancement of liver parenchyma. No focal lesion.  Bile ducts mildly dilated at the liver hilum, unchanged from the comparison. Extrahepatic biliary ducts enlarged, with the greatest diameter of the common hepatic duct measuring approximately 19 mm. Surgical changes of cholecystectomy. No radiopaque stones within the biliary duct. Common bile duct tapers distally at the ampulla. Similar appearance of the pancreatic duct compared to the prior CT. Pancreas: Circumferential hazy fat surrounding the pancreas compatible with inflammatory changes. There are no calcifications associated with the pancreas parenchyma. Uniform enhancement without evidence of necrosis. The portal vein and splenic vein are patent. Unremarkable appearance of the celiac artery.  Spleen: Interval resolution of the focal lesions within the spleen parenchyma, with only 1 small hypodense/hypoenhancing focus. No associated inflammatory changes. Adrenals/Urinary Tract: Unremarkable appearance of the adrenal glands. Bilateral kidneys without hydronephrosis or nephrolithiasis. Unremarkable course the bilateral ureters. Unremarkable appearance of the urinary bladder. Stomach/Bowel: Unremarkable appearance of stomach. Unremarkable small bowel. No abnormal distention. No transition point. Normal appendix. Moderate stool burden. No evidence of obstruction of the colon. Mild diverticular disease without evidence of acute diverticulitis. Vascular/Lymphatic: Calcifications of the abdominal aorta and the iliac vessels. The bilateral iliac arteries and proximal femoral arteries are patent. No aneurysm. Reproductive: There is minimal fluid within the endometrial canal, similar to the comparison CT. Unremarkable appearance of the adnexa. Other: Small fat containing umbilical hernia. Musculoskeletal: Negative for acute displaced fracture. Multilevel degenerative changes of the thoracolumbar spine. IMPRESSION: Inflammatory changes surrounding at the pancreas, compatible with acute pancreatitis. Although the  extrahepatic biliary ducts are somewhat dilated, the patient has cholecystectomy, and there is no evidence of radiopaque choledocholithiasis. Etiology in this patient with chronic pancreatitis may represent distal bile duct stricture, or potentially non radiopaque choledocholithiasis. Given that there are no coarse calcifications in the setting of chronic pancreatitis, less likely diagnosis to additionally consider would be autoimmune pancreatitis/IgG4 syndrome. Interval resolution of multiple spleen lesions in this patient with a history of treated lymphoma. Trace fluid within the endometrial canal. Given the patient's age, referral for Ob/gyn evaluation may be considered, to establish benign endometrial thickening. Aortic Atherosclerosis (ICD10-I70.0). Electronically Signed   By: Corrie Mckusick D.O.   On: 04/13/2018 16:52    Procedures Procedures (including critical care time)  Medications Ordered in ED Medications  lactated ringers infusion (1,000 mLs Intravenous New Bag/Given 04/14/18 0625)  aspirin EC tablet 81 mg (81 mg Oral Given 04/13/18 2342)  acetaminophen (TYLENOL) tablet 650 mg (has no administration in time range)    Or  acetaminophen (TYLENOL) suppository 650 mg (has no administration in time range)  ondansetron (ZOFRAN) tablet 4 mg ( Oral See Alternative 04/13/18 2342)    Or  ondansetron (ZOFRAN) injection 4 mg (4 mg Intravenous Given 04/13/18 2342)  insulin aspart (novoLOG) injection 0-9 Units (0 Units Subcutaneous Not Given 04/14/18 1215)  insulin glargine (LANTUS) injection 10 Units (10 Units Subcutaneous Not Given 04/14/18 0904)  morphine 2 MG/ML injection 2 mg (2 mg Intravenous Given 04/13/18 2327)  hydrALAZINE (APRESOLINE) injection 10 mg (has no administration in time range)  gabapentin (NEURONTIN) capsule 1,200 mg (1,200 mg Oral Given 04/14/18 0903)  aspirin chewable tablet 324 mg (324 mg Oral Given 04/13/18 1538)  morphine 4 MG/ML injection 4 mg (4 mg Intravenous Given 04/13/18  1610)  iopamidol (ISOVUE-300) 61 % injection 100 mL (100 mLs Intravenous Contrast Given 04/13/18 1622)  sodium chloride 0.9 % bolus 1,000 mL (0 mLs Intravenous Stopped 04/13/18 1803)  Morphine Sulfate (PF) SOLN 10 mg (10 mg Intravenous Given 04/13/18 1730)  ondansetron (ZOFRAN) injection 4 mg (4 mg Intravenous Given 04/13/18 1900)     Initial Impression / Assessment and Plan / ED Course  I have reviewed the triage vital signs and the nursing notes.  Pertinent labs & imaging results that were available during my care of the patient were reviewed by me and considered in my medical decision making (see chart for details).     67 year old female with a history of B-cell lymphoma, pancreatitis, hypertension, hyperlipidemia, diabetes presents with concern for chest pain and abdominal pain.  Differential diagnosis includes ACS, pancreatitis, choledocolithiasis.  Patient has a history cholecystectomy  in the past.  EKG shows a right bundle branch block that patient reports has been present previously.  Initial troponin is negative.  Patient with epigastric tenderness on exam and CT abdomen pelvis was ordered.  Lipase is elevated at 2457.  Most likely etiology of patient's epigastric abdominal and lower chest pain is pancreatitis.  Patient has had idiopathic pancreatitis in the past.  Unclear etiology again this time.  Will admit for IV fluids and pain control.  Final Clinical Impressions(s) / ED Diagnoses   Final diagnoses:  Idiopathic acute pancreatitis without infection or necrosis    ED Discharge Orders    None       Gareth Morgan, MD 04/14/18 1234

## 2018-04-13 NOTE — Progress Notes (Signed)
67 yo female with a history of recurrent/chronic pancreatitis, B-cell lymphoma, diabetes, hypertension. She presented secondary to abdominal/chest pain and nausea, found to have acute pancreatitis. Lipase 2457. She is s/p cholecystectomy. CT scan significant for chronic pancreatitis. Patient received morphine in the ED. Given 1L NS. In the past, thought to be idiopathic. Vitals stable; listed 88% oxygen, but patient is on room air. Patient accepted to med-surg, inpatient.  Cordelia Poche, MD [facility] 04/13/2018, 5:19 PM Pager: [phone]

## 2018-04-13 NOTE — H&P (Signed)
History and Physical    Jasmine Davila BZJ:[id number] DOB: [birth year] DOA: 04/13/2018  PCP: [name], DO  Patient coming from: Home.  Chief Complaint: Abdominal pain.  Chest pressure present  HPI: Jasmine Davila is a 67 y.o. female with history of recurrent pancreatitis since 2016, lymphoma in remission, hypertension, diabetes mellitus presents to the ER admits in the [facility] with complaints of chest pressure epigastric discomfort since this morning.  Denies any nausea or vomiting prior to coming to the ER.  Denies any diarrhea.  ED Course: In the ER labs revealed markedly elevated lipase of 2400.  CT of the abdomen pelvis shows features concerning for acute pancreatitis.  Troponin and EKG were unremarkable.  Patient was started on fluids and pain relief medications and admitted for acute pancreatitis.  Review of Systems: As per HPI, rest all negative.   Past Medical History:  Diagnosis Date  . Acute pancreatitis   . Cancer ([place])    B cell lymphoma- Non Hodgkins in remission  . Diabetic neuropathy ([place])   . Hypertension     Past Surgical History:  Procedure Laterality Date  . CHOLECYSTECTOMY    . MEDIPORT INSERTION, DOUBLE    . MEDIPORT REMOVAL       reports that she quit smoking about 49 years ago. Her smoking use included cigarettes. She has a 2.00 pack-year smoking history. She has never used smokeless tobacco. She reports that she does not drink alcohol or use drugs.  Allergies  Allergen Reactions  . Dilaudid [Hydromorphone Hcl] Other (See Comments)    anxious  . Doxycycline Other (See Comments)    Pancreatic issues  . Hydromorphone   . Metronidazole Other (See Comments)    Pt doesn't recall  . Pseudoephedrine Hcl     Family History  Problem Relation Age of Onset  . Cancer Mother        colon  . Heart attack Father   . Heart disease Sister   . Alcohol abuse Maternal Grandfather     Prior to Admission medications   Medication  Sig Start Date End Date Taking? Authorizing Provider  aspirin EC 81 MG tablet Take 81 mg by mouth at bedtime.    Yes [provider]  Cholecalciferol (VITAMIN D-3) 5000 units TABS Take 1 tablet by mouth daily.   Yes [provider]  famotidine (PEPCID) 20 MG tablet Take 1 tablet (20 mg total) by mouth 2 (two) times daily. 12/29/17  Yes Opalski, Neoma Laming, DO  folic acid (FOLVITE) 017 MCG tablet Take 400 mcg by mouth daily.   Yes [provider]  gabapentin (NEURONTIN) 300 MG capsule Take 4 capsules (1,200 mg total) by mouth 3 (three) times daily. 03/16/18  Yes Opalski, Neoma Laming, DO  insulin lispro (HUMALOG KWIKPEN) 100 UNIT/ML KiwkPen Inject 0.2 mLs (20 Units total) into the skin daily. Daily with largest meal 12/29/17  Yes Opalski, Deborah, DO  LANTUS SOLOSTAR 100 UNIT/ML Solostar Pen INJECT 60 UNITS INTO THE SKIN EVERY MORNING. 04/06/18  Yes Opalski, Deborah, DO  losartan (COZAAR) 25 MG tablet Take 1 tablet (25 mg total) by mouth daily. 01/24/18  Yes Opalski, Deborah, DO  Multiple Vitamin (MULTIVITAMIN) tablet Take 1 tablet by mouth daily.   Yes [provider]  niacin (NIASPAN) 1000 MG CR tablet Take 1 tablet (1,000 mg total) by mouth at bedtime. An hour after 81 mg ASA 12/29/16  Yes Opalski, Neoma Laming, DO  NONFORMULARY OR COMPOUNDED ITEM [facility]  Peripheral Neuropathy Cream- Bupivacaine 1%,  Doxepin 3%, Gabapentin 6%, Pentoxifylline 3%, Topiramate 1% Apply 1-2 grams to affected area 3-4 times daily Qty. 120 gm 3 refills 04/04/18  Yes Edrick Kins, DPM  pyridoxine (B6 NATURAL) 100 MG tablet Take 200 mg by mouth daily.   Yes [provider]  cyanocobalamin (,VITAMIN B-12,) 1000 MCG/ML injection Inject 0.5 mLs (500 mcg total) into the muscle every 30 (thirty) days. 04/05/18   [name], DO    Physical Exam: Vitals:   04/13/18 1815 04/13/18 1830 04/13/18 1936 04/13/18 2136  BP:  112/60 114/68 121/69  Pulse: 63 65 67 62  Resp: 12 14 18 18   Temp:    97.8 F (36.6 C)   TempSrc:   Oral   SpO2: 99% 97% 100% 99%  Weight:      Height:          Constitutional: Moderately built and nourished. Vitals:   04/13/18 1815 04/13/18 1830 04/13/18 1936 04/13/18 2136  BP:  112/60 114/68 121/69  Pulse: 63 65 67 62  Resp: 12 14 18 18   Temp:   97.8 F (36.6 C)   TempSrc:   Oral   SpO2: 99% 97% 100% 99%  Weight:      Height:       Eyes: Anicteric no pallor. ENMT: No discharge from the ears eyes nose or Motrin Neck: No mass felt.  No neck rigidity. Respiratory: No rhonchi or crepitations. Cardiovascular: S1-S2 heard no murmurs appreciated. Abdomen: Mild tenderness in epigastric area no guarding or rigidity. Musculoskeletal: No edema.  No joint effusion. Skin: No rash. Neurologic: Alert awake oriented to time place and person.  Moves all extremities. Psychiatric: Appears normal.  Normal affect.   Labs on Admission: I have personally reviewed following labs and imaging studies  CBC: Recent Labs  Lab 04/13/18 1520  WBC 8.4  NEUTROABS 6.6  HGB 13.8  HCT 39.1  MCV 87.5  PLT 811*   Basic Metabolic Panel: Recent Labs  Lab 04/13/18 1520  NA 132*  K 4.1  CL 97*  CO2 28  GLUCOSE 132*  BUN 12  CREATININE 0.74  CALCIUM 8.9   GFR: Estimated Creatinine Clearance: 85.5 mL/min (by C-G formula based on SCr of 0.74 mg/dL). Liver Function Tests: Recent Labs  Lab 04/13/18 1520  AST 35  ALT 32  ALKPHOS 71  BILITOT 0.5  PROT 7.2  ALBUMIN 3.9   Recent Labs  Lab 04/13/18 1520  LIPASE 2,457*   No results for input(s): AMMONIA in the last 168 hours. Coagulation Profile: No results for input(s): INR, PROTIME in the last 168 hours. Cardiac Enzymes: Recent Labs  Lab 04/13/18 1520  TROPONINI <0.03   BNP (last 3 results) No results for input(s): PROBNP in the last 8760 hours. HbA1C: No results for input(s): HGBA1C in the last 72 hours. CBG: No results for input(s): GLUCAP in the last 168 hours. Lipid Profile: No  results for input(s): CHOL, HDL, LDLCALC, TRIG, CHOLHDL, LDLDIRECT in the last 72 hours. Thyroid Function Tests: No results for input(s): TSH, T4TOTAL, FREET4, T3FREE, THYROIDAB in the last 72 hours. Anemia Panel: No results for input(s): VITAMINB12, FOLATE, FERRITIN, TIBC, IRON, RETICCTPCT in the last 72 hours. Urine analysis: No results found for: COLORURINE, APPEARANCEUR, LABSPEC, PHURINE, GLUCOSEU, HGBUR, BILIRUBINUR, KETONESUR, PROTEINUR, UROBILINOGEN, NITRITE, LEUKOCYTESUR Sepsis Labs: @LABRCNTIP (procalcitonin:4,lacticidven:4) )No results found for this or any previous visit (from the past 240 hour(s)).   Radiological Exams on Admission: Dg Chest 2 View  Result Date: 04/13/2018 CLINICAL DATA:  Patient with acute pancreatitis.  Epigastric and chest pain for 6 hours today. EXAM: CHEST - 2 VIEW COMPARISON:  Single-view of the chest 02/15/2015. CT abdomen and pelvis today. FINDINGS: Lungs are clear. Heart size is normal. No pneumothorax or pleural fluid. Aortic atherosclerosis is noted. No acute bony abnormality. IMPRESSION: No acute disease. Atherosclerosis. Electronically Signed   By: Inge Rise M.D.   On: 04/13/2018 16:55   Ct Abdomen Pelvis W Contrast  Result Date: 04/13/2018 CLINICAL DATA:  67 year old female with a history of epigastric abdominal pain for 6 hours. History of B-cell lymphoma. History of pancreatitis EXAM: CT ABDOMEN AND PELVIS WITH CONTRAST TECHNIQUE: Multidetector CT imaging of the abdomen and pelvis was performed using the standard protocol following bolus administration of intravenous contrast. CONTRAST:  169mL ISOVUE-300 IOPAMIDOL (ISOVUE-300) INJECTION 61% COMPARISON:  CT 02/14/2015 FINDINGS: Lower chest: No acute finding of the lower chest Hepatobiliary: Uniform attenuation/enhancement of liver parenchyma. No focal lesion. Bile ducts mildly dilated at the liver hilum, unchanged from the comparison. Extrahepatic biliary ducts enlarged, with the greatest diameter of  the common hepatic duct measuring approximately 19 mm. Surgical changes of cholecystectomy. No radiopaque stones within the biliary duct. Common bile duct tapers distally at the ampulla. Similar appearance of the pancreatic duct compared to the prior CT. Pancreas: Circumferential hazy fat surrounding the pancreas compatible with inflammatory changes. There are no calcifications associated with the pancreas parenchyma. Uniform enhancement without evidence of necrosis. The portal vein and splenic vein are patent. Unremarkable appearance of the celiac artery. Spleen: Interval resolution of the focal lesions within the spleen parenchyma, with only 1 small hypodense/hypoenhancing focus. No associated inflammatory changes. Adrenals/Urinary Tract: Unremarkable appearance of the adrenal glands. Bilateral kidneys without hydronephrosis or nephrolithiasis. Unremarkable course the bilateral ureters. Unremarkable appearance of the urinary bladder. Stomach/Bowel: Unremarkable appearance of stomach. Unremarkable small bowel. No abnormal distention. No transition point. Normal appendix. Moderate stool burden. No evidence of obstruction of the colon. Mild diverticular disease without evidence of acute diverticulitis. Vascular/Lymphatic: Calcifications of the abdominal aorta and the iliac vessels. The bilateral iliac arteries and proximal femoral arteries are patent. No aneurysm. Reproductive: There is minimal fluid within the endometrial canal, similar to the comparison CT. Unremarkable appearance of the adnexa. Other: Small fat containing umbilical hernia. Musculoskeletal: Negative for acute displaced fracture. Multilevel degenerative changes of the thoracolumbar spine. IMPRESSION: Inflammatory changes surrounding at the pancreas, compatible with acute pancreatitis. Although the extrahepatic biliary ducts are somewhat dilated, the patient has cholecystectomy, and there is no evidence of radiopaque choledocholithiasis. Etiology in  this patient with chronic pancreatitis may represent distal bile duct stricture, or potentially non radiopaque choledocholithiasis. Given that there are no coarse calcifications in the setting of chronic pancreatitis, less likely diagnosis to additionally consider would be autoimmune pancreatitis/IgG4 syndrome. Interval resolution of multiple spleen lesions in this patient with a history of treated lymphoma. Trace fluid within the endometrial canal. Given the patient's age, referral for Ob/gyn evaluation may be considered, to establish benign endometrial thickening. Aortic Atherosclerosis (ICD10-I70.0). Electronically Signed   By: Corrie Mckusick D.O.   On: 04/13/2018 16:52    EKG: Independently reviewed.  Normal sinus rhythm.  Assessment/Plan Principal Problem:   Acute pancreatitis Active Problems:   H/o Cancer:   B- cell lymphoma (spleen and lung) - non-Hodgkin's   B12 deficiency   Diabetes mellitus due to underlying condition with diabetic nephropathy, with long-term current use of insulin (HCC)   History of decreased platelet count    1. Acute pancreatitis -patient has had recurrent pancreatitis and  this is the fourth bout in last 3 years.  As per the radiology report patient could be having distal bile duct stricture on nonopaque choledocholithiasis.  Other causes could be autoimmune pancreatitis.  May consult gastroenterologist in a.m.  Will keep patient n.p.o. and IV fluids and pain relief medications. 2. History of hypertension -since patient is n.p.o. I have placed patient on as needed IV hydralazine. 3. Diabetes mellitus type 2 on Lantus insulin -patient usually takes 60 units in the morning.  For now I have placed patient on 10 units with sliding scale coverage.  Closely follow CBGs. 4. Fluid in endometrial canal per the CAT scan will need follow-up as outpatient. 5. Thrombocytopenia appears to be chronic follow CBC. 6. History of lymphoma in remission.  Being followed by oncologist in  [place]. 7. History of sleep apnea -has not been using CPAP for last few months.  Patient has just recently moved from [place] last year.   DVT prophylaxis: SCDs in anticipation of procedure. Code Status: Full code. Family Communication: Discussed with patient. Disposition Plan: Home. Consults called: None. Admission status: Inpatient.   Rise Patience MD [facility] Pager [phone].  If 7PM-7AM, please contact night-coverage [email or web address] Password [facility]  04/13/2018, 10:42 PM

## 2018-04-13 NOTE — ED Triage Notes (Signed)
Pt c/o epigastric abd pain x 6 hrs

## 2018-04-14 ENCOUNTER — Inpatient Hospital Stay (HOSPITAL_COMMUNITY): Payer: Medicare Other

## 2018-04-14 ENCOUNTER — Encounter (HOSPITAL_COMMUNITY): Payer: Self-pay | Admitting: Gastroenterology

## 2018-04-14 ENCOUNTER — Other Ambulatory Visit: Payer: Self-pay

## 2018-04-14 DIAGNOSIS — R1013 Epigastric pain: Secondary | ICD-10-CM

## 2018-04-14 DIAGNOSIS — Z8579 Personal history of other malignant neoplasms of lymphoid, hematopoietic and related tissues: Secondary | ICD-10-CM

## 2018-04-14 LAB — CBC
HEMATOCRIT: 36.9 % (ref 36.0–46.0)
HEMOGLOBIN: 12.6 g/dL (ref 12.0–15.0)
MCH: 30.5 pg (ref 26.0–34.0)
MCHC: 34.1 g/dL (ref 30.0–36.0)
MCV: 89.3 fL (ref 78.0–100.0)
Platelets: 124 10*3/uL — ABNORMAL LOW (ref 150–400)
RBC: 4.13 MIL/uL (ref 3.87–5.11)
RDW: 13.1 % (ref 11.5–15.5)
WBC: 4.9 10*3/uL (ref 4.0–10.5)

## 2018-04-14 LAB — URINALYSIS, ROUTINE W REFLEX MICROSCOPIC
BACTERIA UA: NONE SEEN
Bilirubin Urine: NEGATIVE
Glucose, UA: NEGATIVE mg/dL
Hgb urine dipstick: NEGATIVE
KETONES UR: NEGATIVE mg/dL
NITRITE: NEGATIVE
PH: 7 (ref 5.0–8.0)
PROTEIN: NEGATIVE mg/dL
Specific Gravity, Urine: >1.046 — ABNORMAL HIGH (ref 1.005–1.030)

## 2018-04-14 LAB — BASIC METABOLIC PANEL
ANION GAP: 8 (ref 5–15)
BUN: 9 mg/dL (ref 6–20)
CO2: 28 mmol/L (ref 22–32)
Calcium: 8.7 mg/dL — ABNORMAL LOW (ref 8.9–10.3)
Chloride: 99 mmol/L — ABNORMAL LOW (ref 101–111)
Creatinine, Ser: 0.6 mg/dL (ref 0.44–1.00)
Glucose, Bld: 99 mg/dL (ref 65–99)
POTASSIUM: 3.9 mmol/L (ref 3.5–5.1)
SODIUM: 135 mmol/L (ref 135–145)

## 2018-04-14 LAB — GLUCOSE, CAPILLARY
GLUCOSE-CAPILLARY: 83 mg/dL (ref 65–99)
GLUCOSE-CAPILLARY: 92 mg/dL (ref 65–99)
Glucose-Capillary: 110 mg/dL — ABNORMAL HIGH (ref 65–99)
Glucose-Capillary: 113 mg/dL — ABNORMAL HIGH (ref 65–99)
Glucose-Capillary: 86 mg/dL (ref 65–99)
Glucose-Capillary: 87 mg/dL (ref 65–99)

## 2018-04-14 LAB — HEPATIC FUNCTION PANEL
ALBUMIN: 3.4 g/dL — AB (ref 3.5–5.0)
ALK PHOS: 103 U/L (ref 38–126)
ALT: 87 U/L — AB (ref 14–54)
AST: 84 U/L — AB (ref 15–41)
BILIRUBIN DIRECT: 0.1 mg/dL (ref 0.1–0.5)
BILIRUBIN TOTAL: 0.9 mg/dL (ref 0.3–1.2)
Indirect Bilirubin: 0.8 mg/dL (ref 0.3–0.9)
Total Protein: 6.7 g/dL (ref 6.5–8.1)

## 2018-04-14 MED ORDER — GABAPENTIN 400 MG PO CAPS
1200.0000 mg | ORAL_CAPSULE | Freq: Three times a day (TID) | ORAL | Status: DC
  Administered 2018-04-14 – 2018-04-15 (×4): 1200 mg via ORAL
  Filled 2018-04-14 (×4): qty 3

## 2018-04-14 MED ORDER — GADOBENATE DIMEGLUMINE 529 MG/ML IV SOLN
20.0000 mL | Freq: Once | INTRAVENOUS | Status: AC | PRN
  Administered 2018-04-14: 20 mL via INTRAVENOUS

## 2018-04-14 NOTE — Progress Notes (Signed)
[facility] Progress Note  Subjective: no new c/o  Vitals:   04/13/18 1936 04/13/18 2136 04/14/18 0407 04/14/18 1601  BP: 114/68 121/69 122/66 136/68  Pulse: 67 62 (!) 58 66  Resp: 18 18 16 16   Temp: 97.8 F (36.6 C)  97.6 F (36.4 C) (!) 97.4 F (36.3 C)  TempSrc: Oral  Oral Oral  SpO2: 100% 99% 100% 100%  Weight:      Height:        Inpatient medications: . aspirin EC  81 mg Oral QHS  . gabapentin  1,200 mg Oral TID  . insulin aspart  0-9 Units Subcutaneous Q4H  . insulin glargine  10 Units Subcutaneous Daily   . lactated ringers 125 mL/hr at 04/14/18 1723   acetaminophen **OR** acetaminophen, hydrALAZINE, morphine injection, ondansetron **OR** ondansetron (ZOFRAN) IV  Exam: Eyes: Anicteric no pallor. ENMT: No discharge from the ears eyes nose or Motrin Neck: No mass felt.  No neck rigidity. Respiratory: No rhonchi or crepitations. Cardiovascular: S1-S2 heard no murmurs appreciated. Abdomen: Mild tenderness in epigastric area no guarding or rigidity. Musculoskeletal: No edema.  No joint effusion. Skin: No rash. Neurologic: Alert awake oriented to time place and person.  Moves all extremities. Psychiatric: Appears normal.  Normal affect.     Brief Summary: Jasmine Davila is a 67 y.o. female with history of recurrent pancreatitis since 2016, lymphoma in remission, hypertension, diabetes mellitus presents to the ER admits in the [facility] with complaints of chest pressure epigastric discomfort since this morning.  Denied any nausea or vomiting prior to coming to the ER.  Denied any diarrhea.  ED Course: In the ER labs revealed markedly elevated lipase of 2400.  CT of the abdomen pelvis shows features concerning for acute pancreatitis.  Troponin and EKG were unremarkable.  Patient was started on fluids and pain relief medications and admitted for acute pancreatitis.      Principal Problem:   Acute pancreatitis Active Problems:   H/o Cancer:   B-  cell lymphoma (spleen and lung) - non-Hodgkin's   B12 deficiency   Diabetes mellitus due to underlying condition with diabetic nephropathy, with long-term current use of insulin (HCC)   History of decreased platelet count   Impression/ Plan:  Acute pancreatitis -patient has had recurrent pancreatitis and this is the fourth bout in last 3 years.  As per the radiology report patient could be having distal bile duct stricture on nonopaque choledocholithiasis.  Other causes could be autoimmune pancreatitis.   - have consulted GI, greatly appreciate recommendations - keep patient n.p.o. - cont IV fluids and pain relief medications  History of hypertension - pt is npo for pancreatitis, plan... - as needed IV hydralazine.  Diabetes mellitus type 2 - on Lantus insulin, patient usually takes 60 units in the morning.  For now is on 10 units with sliding scale coverage.  Closely follow CBGs.  Fluid in endometrial canal - per the CAT scan,will need follow-up as outpatient.  Thrombocytopenia - appears to be chronic follow CBC.  History of lymphoma- in remission.  Being followed by oncologist in [place].  History of sleep apnea -has not been using CPAP for last few months.    Patient has just recently moved from [place] last year.   DVT prophylaxis: SCDs in anticipation of procedure. Code Status: Full code. Family Communication: Discussed with patient. Disposition Plan: Home. Consults called: None. Admission status: Inpatient.      Kelly Splinter MD [facility] pgr [phone]  04/14/2018, 6:08 PM   Recent Labs  Lab 04/13/18 1520 04/14/18 0516  NA 132* 135  K 4.1 3.9  CL 97* 99*  CO2 28 28  GLUCOSE 132* 99  BUN 12 9  CREATININE 0.74 0.60  CALCIUM 8.9 8.7*   Recent Labs  Lab 04/13/18 1520 04/14/18 0516  AST 35 84*  ALT 32 87*  ALKPHOS 71 103  BILITOT 0.5 0.9  PROT 7.2 6.7  ALBUMIN 3.9 3.4*   Recent Labs  Lab 04/13/18 1520 04/14/18 0516  WBC  8.4 4.9  NEUTROABS 6.6  --   HGB 13.8 12.6  HCT 39.1 36.9  MCV 87.5 89.3  PLT 138* 124*   Iron/TIBC/Ferritin/ %Sat No results found for: IRON, TIBC, FERRITIN, IRONPCTSAT

## 2018-04-14 NOTE — Plan of Care (Signed)
Plan of care discussed with patient 

## 2018-04-14 NOTE — Consult Note (Signed)
Reason for Consult:pancreatitis Referring Physician: Hospital team  Jasmine Davila is an 67 y.o. female.  HPI: patient seen and examined and case discussed with the hospital team as well as her husband and this is her fourth episode of pancreatitis with one 13 years ago and 3 in the last 3 years and it sounds like in [place] she may have had an endoscopic ultrasound and they told her it was idiopathic and she did have her gallbladder out years ago with stones but she does not remember being tested for autoimmune pancreatitisand they did fine her lymphoma based on a CT for pancreatitis 3 years ago and she does not drink alcohol and her family history is negative for pancreatitis and she denies any new medicine or over-the-counter herbs or vitamin except for a new arthritis screening and currently she is feeling better without any pain or nausea and we discussedhow she is due for colonoscopy in October for family history of colon cancer as well  Past Medical History:  Diagnosis Date  . Acute pancreatitis   . Cancer ([place])    B cell lymphoma- Non Hodgkins in remission  . Diabetic neuropathy ([place])   . Hypertension     Past Surgical History:  Procedure Laterality Date  . CHOLECYSTECTOMY    . MEDIPORT INSERTION, DOUBLE    . MEDIPORT REMOVAL      Family History  Problem Relation Age of Onset  . Cancer Mother        colon  . Heart attack Father   . Heart disease Sister   . Alcohol abuse Maternal Grandfather     Social History:  reports that she quit smoking about 49 years ago. Her smoking use included cigarettes. She has a 2.00 pack-year smoking history. She has never used smokeless tobacco. She reports that she does not drink alcohol or use drugs.  Allergies:  Allergies  Allergen Reactions  . Dilaudid [Hydromorphone Hcl] Other (See Comments)    anxious  . Doxycycline Other (See Comments)    Pancreatic issues  . Hydromorphone   . Metronidazole Other (See Comments)     Pt doesn't recall  . Pseudoephedrine Hcl     Medications: I have reviewed the patient's current medications.  Results for orders placed or performed during the hospital encounter of 04/13/18 (from the past 48 hour(s))  CBC with Differential     Status: Abnormal   Collection Time: 04/13/18  3:20 PM  Result Value Ref Range   WBC 8.4 4.0 - 10.5 K/uL   RBC 4.47 3.87 - 5.11 MIL/uL   Hemoglobin 13.8 12.0 - 15.0 g/dL   HCT 39.1 36.0 - 46.0 %   MCV 87.5 78.0 - 100.0 fL   MCH 30.9 26.0 - 34.0 pg   MCHC 35.3 30.0 - 36.0 g/dL   RDW 13.0 11.5 - 15.5 %   Platelets 138 (L) 150 - 400 K/uL   Neutrophils Relative % 78 %   Neutro Abs 6.6 1.7 - 7.7 K/uL   Lymphocytes Relative 16 %   Lymphs Abs 1.3 0.7 - 4.0 K/uL   Monocytes Relative 5 %   Monocytes Absolute 0.4 0.1 - 1.0 K/uL   Eosinophils Relative 1 %   Eosinophils Absolute 0.1 0.0 - 0.7 K/uL   Basophils Relative 0 %   Basophils Absolute 0.0 0.0 - 0.1 K/uL    Comment: Performed at [facility], [address] [place], [place] [address]  Comprehensive metabolic panel  Status: Abnormal   Collection Time: 04/13/18  3:20 PM  Result Value Ref Range   Sodium 132 (L) 135 - 145 mmol/L   Potassium 4.1 3.5 - 5.1 mmol/L   Chloride 97 (L) 101 - 111 mmol/L   CO2 28 22 - 32 mmol/L   Glucose, Bld 132 (H) 65 - 99 mg/dL   BUN 12 6 - 20 mg/dL   Creatinine, Ser 0.74 0.44 - 1.00 mg/dL   Calcium 8.9 8.9 - 10.3 mg/dL   Total Protein 7.2 6.5 - 8.1 g/dL   Albumin 3.9 3.5 - 5.0 g/dL   AST 35 15 - 41 U/L   ALT 32 14 - 54 U/L   Alkaline Phosphatase 71 38 - 126 U/L   Total Bilirubin 0.5 0.3 - 1.2 mg/dL   GFR calc non Af Amer >60 >60 mL/min   GFR calc Af Amer >60 >60 mL/min    Comment: (NOTE) The eGFR has been calculated using the CKD EPI equation. This calculation has not been validated in all clinical situations. eGFR's persistently <60 mL/min signify possible Chronic Kidney Disease.    Anion gap 7 5 - 15    Comment: Performed at [facility], [address] [place], [place] [address]  Lipase, blood     Status: Abnormal   Collection Time: 04/13/18  3:20 PM  Result Value Ref Range   Lipase 2,457 (H) 11 - 51 U/L    Comment: RESULTS CONFIRMED BY MANUAL DILUTION REPEATED TO VERIFY Performed at [facility], [address] [place], [place] [address]   Troponin I     Status: None   Collection Time: 04/13/18  3:20 PM  Result Value Ref Range   Troponin I <0.03 <0.03 ng/mL    Comment: Performed at [facility], [address] [place], [place] [address]  Urinalysis, Routine w reflex microscopic     Status: Abnormal   Collection Time: 04/13/18 10:03 PM  Result Value Ref Range   Color, Urine YELLOW YELLOW   APPearance CLEAR CLEAR   Specific Gravity, Urine >1.046 (H) 1.005 - 1.030   pH 7.0 5.0 - 8.0   Glucose, UA NEGATIVE NEGATIVE mg/dL   Hgb urine dipstick NEGATIVE NEGATIVE   Bilirubin Urine NEGATIVE NEGATIVE   Ketones, ur NEGATIVE NEGATIVE mg/dL   Protein, ur NEGATIVE NEGATIVE mg/dL   Nitrite NEGATIVE NEGATIVE   Leukocytes, UA TRACE (A) NEGATIVE   RBC / HPF 6-10 0 - 5 RBC/hpf   WBC, UA 6-10 0 - 5 WBC/hpf   Bacteria, UA NONE SEEN NONE SEEN    Comment: Performed at [facility], [place] [address], [place], [place] [address]  Triglycerides     Status: None   Collection Time: 04/13/18 10:55 PM  Result Value Ref Range   Triglycerides 68 <150 mg/dL    Comment: Performed at [facility], [place] [address], [place], [place] [address]  Troponin I     Status: None   Collection Time: 04/13/18 10:55 PM  Result Value Ref Range   Troponin I <0.03 <0.03 ng/mL    Comment: Performed at [facility], [place] [address], [place], [place] [address]  Glucose, capillary     Status: Abnormal   Collection Time: 04/13/18 11:44 PM  Result Value Ref Range   Glucose-Capillary 110 (H) 65 - 99 mg/dL  Glucose, capillary     Status: None   Collection Time:  04/14/18  4:05 AM  Result Value Ref Range   Glucose-Capillary 86 65 - 99 mg/dL  Basic metabolic panel     Status: Abnormal   Collection Time: 04/14/18  5:16 AM  Result Value Ref Range   Sodium 135 135 - 145 mmol/L   Potassium 3.9 3.5 - 5.1 mmol/L   Chloride 99 (L) 101 - 111 mmol/L   CO2 28 22 - 32 mmol/L   Glucose, Bld 99 65 - 99 mg/dL   BUN 9 6 - 20 mg/dL   Creatinine, Ser 0.60 0.44 - 1.00 mg/dL   Calcium 8.7 (L) 8.9 - 10.3 mg/dL   GFR calc non Af Amer >60 >60 mL/min   GFR calc Af Amer >60 >60 mL/min    Comment: (NOTE) The eGFR has been calculated using the CKD EPI equation. This calculation has not been validated in all clinical situations. eGFR's persistently <60 mL/min signify possible Chronic Kidney Disease.    Anion gap 8 5 - 15    Comment: Performed at [facility], [place] [address], [place], [place] [address]  CBC     Status: Abnormal   Collection Time: 04/14/18  5:16 AM  Result Value Ref Range   WBC 4.9 4.0 - 10.5 K/uL   RBC 4.13 3.87 - 5.11 MIL/uL   Hemoglobin 12.6 12.0 - 15.0 g/dL   HCT 36.9 36.0 - 46.0 %   MCV 89.3 78.0 - 100.0 fL   MCH 30.5 26.0 - 34.0 pg   MCHC 34.1 30.0 - 36.0 g/dL   RDW 13.1 11.5 - 15.5 %   Platelets 124 (L) 150 - 400 K/uL    Comment: Performed at [facility], [place] [address] [place], [place] [address]  Hepatic function panel     Status: Abnormal   Collection Time: 04/14/18  5:16 AM  Result Value Ref Range   Total Protein 6.7 6.5 - 8.1 g/dL   Albumin 3.4 (L) 3.5 - 5.0 g/dL   AST 84 (H) 15 - 41 U/L   ALT 87 (H) 14 - 54 U/L   Alkaline Phosphatase 103 38 - 126 U/L   Total Bilirubin 0.9 0.3 - 1.2 mg/dL   Bilirubin, Direct 0.1 0.1 - 0.5 mg/dL   Indirect Bilirubin 0.8 0.3 - 0.9 mg/dL    Comment: Performed at [facility], [place] [address], [place], [place] [address]  Glucose, capillary     Status: None   Collection Time: 04/14/18  7:38 AM  Result Value Ref Range   Glucose-Capillary 83  65 - 99 mg/dL  Glucose, capillary     Status: Abnormal   Collection Time: 04/14/18 11:27 AM  Result Value Ref Range   Glucose-Capillary 113 (H) 65 - 99 mg/dL    Dg Chest 2 View  Result Date: 04/13/2018 CLINICAL DATA:  Patient with acute pancreatitis. Epigastric and chest pain for 6 hours today. EXAM: CHEST - 2 VIEW COMPARISON:  Single-view of the chest 02/15/2015. CT abdomen and pelvis today. FINDINGS: Lungs are clear. Heart size is normal. No pneumothorax or pleural fluid. Aortic atherosclerosis is noted. No acute bony abnormality. IMPRESSION: No acute disease. Atherosclerosis. Electronically Signed   By: Inge Rise M.D.   On: 04/13/2018 16:55   Ct Abdomen Pelvis W Contrast  Result Date: 04/13/2018 CLINICAL DATA:  67 year old female with a history of epigastric abdominal pain for 6 hours. History of B-cell lymphoma. History of pancreatitis EXAM: CT ABDOMEN AND PELVIS WITH CONTRAST TECHNIQUE: Multidetector CT imaging of the abdomen and pelvis was performed  using the standard protocol following bolus administration of intravenous contrast. CONTRAST:  135m ISOVUE-300 IOPAMIDOL (ISOVUE-300) INJECTION 61% COMPARISON:  CT 02/14/2015 FINDINGS: Lower chest: No acute finding of the lower chest Hepatobiliary: Uniform attenuation/enhancement of liver parenchyma. No focal lesion. Bile ducts mildly dilated at the liver hilum, unchanged from the comparison. Extrahepatic biliary ducts enlarged, with the greatest diameter of the common hepatic duct measuring approximately 19 mm. Surgical changes of cholecystectomy. No radiopaque stones within the biliary duct. Common bile duct tapers distally at the ampulla. Similar appearance of the pancreatic duct compared to the prior CT. Pancreas: Circumferential hazy fat surrounding the pancreas compatible with inflammatory changes. There are no calcifications associated with the pancreas parenchyma. Uniform enhancement without evidence of necrosis. The portal vein and  splenic vein are patent. Unremarkable appearance of the celiac artery. Spleen: Interval resolution of the focal lesions within the spleen parenchyma, with only 1 small hypodense/hypoenhancing focus. No associated inflammatory changes. Adrenals/Urinary Tract: Unremarkable appearance of the adrenal glands. Bilateral kidneys without hydronephrosis or nephrolithiasis. Unremarkable course the bilateral ureters. Unremarkable appearance of the urinary bladder. Stomach/Bowel: Unremarkable appearance of stomach. Unremarkable small bowel. No abnormal distention. No transition point. Normal appendix. Moderate stool burden. No evidence of obstruction of the colon. Mild diverticular disease without evidence of acute diverticulitis. Vascular/Lymphatic: Calcifications of the abdominal aorta and the iliac vessels. The bilateral iliac arteries and proximal femoral arteries are patent. No aneurysm. Reproductive: There is minimal fluid within the endometrial canal, similar to the comparison CT. Unremarkable appearance of the adnexa. Other: Small fat containing umbilical hernia. Musculoskeletal: Negative for acute displaced fracture. Multilevel degenerative changes of the thoracolumbar spine. IMPRESSION: Inflammatory changes surrounding at the pancreas, compatible with acute pancreatitis. Although the extrahepatic biliary ducts are somewhat dilated, the patient has cholecystectomy, and there is no evidence of radiopaque choledocholithiasis. Etiology in this patient with chronic pancreatitis may represent distal bile duct stricture, or potentially non radiopaque choledocholithiasis. Given that there are no coarse calcifications in the setting of chronic pancreatitis, less likely diagnosis to additionally consider would be autoimmune pancreatitis/IgG4 syndrome. Interval resolution of multiple spleen lesions in this patient with a history of treated lymphoma. Trace fluid within the endometrial canal. Given the patient's age, referral for  Ob/gyn evaluation may be considered, to establish benign endometrial thickening. Aortic Atherosclerosis (ICD10-I70.0). Electronically Signed   By: JCorrie MckusickD.O.   On: 04/13/2018 16:52    ROSnegative except above Blood pressure 122/66, pulse (!) 58, temperature 97.6 F (36.4 C), temperature source Oral, resp. rate 16, height 5' 7"  (1.702 m), weight 106.1 kg (234 lb), SpO2 100 %. Physical Exam Vital signs stable afebrile no acute distress patient lying comfortably in the bed exam pertinent for her abdomen being soft occasional bowel sounds nontender no guarding or reboundlabs and CT reviewed Assessment/Plan: Recurrent pancreatitis questionable etiology Plan: She will go by my office inferolateral release of [facility] will slowly advance diet and hopefully she can go home soon and will proceed with an MRCP just to be sure that CBD stones or not playing a role and will get an autoimmune IgG4 level and will check on tomorrow  [facility] 04/14/2018, 3:26 PM

## 2018-04-15 DIAGNOSIS — Z794 Long term (current) use of insulin: Secondary | ICD-10-CM

## 2018-04-15 DIAGNOSIS — E538 Deficiency of other specified B group vitamins: Secondary | ICD-10-CM

## 2018-04-15 DIAGNOSIS — C801 Malignant (primary) neoplasm, unspecified: Secondary | ICD-10-CM

## 2018-04-15 DIAGNOSIS — E0821 Diabetes mellitus due to underlying condition with diabetic nephropathy: Secondary | ICD-10-CM

## 2018-04-15 DIAGNOSIS — Z862 Personal history of diseases of the blood and blood-forming organs and certain disorders involving the immune mechanism: Secondary | ICD-10-CM

## 2018-04-15 LAB — IGG, IGA, IGM
IgA: 231 mg/dL (ref 87–352)
IgG (Immunoglobin G), Serum: 1202 mg/dL (ref 700–1600)
IgM (Immunoglobulin M), Srm: 100 mg/dL (ref 26–217)

## 2018-04-15 LAB — BASIC METABOLIC PANEL
ANION GAP: 10 (ref 5–15)
BUN: 6 mg/dL (ref 6–20)
CALCIUM: 9.1 mg/dL (ref 8.9–10.3)
CO2: 29 mmol/L (ref 22–32)
Chloride: 102 mmol/L (ref 101–111)
Creatinine, Ser: 0.68 mg/dL (ref 0.44–1.00)
GFR calc Af Amer: >60 mL/min (ref >60)
Glucose, Bld: 125 mg/dL — ABNORMAL HIGH (ref 65–99)
POTASSIUM: 3.9 mmol/L (ref 3.5–5.1)
SODIUM: 141 mmol/L (ref 135–145)

## 2018-04-15 LAB — GLUCOSE, CAPILLARY
GLUCOSE-CAPILLARY: 108 mg/dL — AB (ref 65–99)
Glucose-Capillary: 129 mg/dL — ABNORMAL HIGH (ref 65–99)
Glucose-Capillary: 133 mg/dL — ABNORMAL HIGH (ref 65–99)
Glucose-Capillary: 159 mg/dL — ABNORMAL HIGH (ref 65–99)

## 2018-04-15 LAB — URINE CULTURE

## 2018-04-15 LAB — CBC
HCT: 36.6 % (ref 36.0–46.0)
HEMOGLOBIN: 12.3 g/dL (ref 12.0–15.0)
MCH: 30.1 pg (ref 26.0–34.0)
MCHC: 33.6 g/dL (ref 30.0–36.0)
MCV: 89.7 fL (ref 78.0–100.0)
PLATELETS: 120 10*3/uL — AB (ref 150–400)
RBC: 4.08 MIL/uL (ref 3.87–5.11)
RDW: 13 % (ref 11.5–15.5)
WBC: 4 10*3/uL (ref 4.0–10.5)

## 2018-04-15 LAB — LIPASE, BLOOD: LIPASE: 82 U/L — AB (ref 11–51)

## 2018-04-15 LAB — LIPID PANEL
CHOLESTEROL: 148 mg/dL (ref 0–200)
HDL: 39 mg/dL — AB (ref >40)
LDL Cholesterol: 86 mg/dL (ref 0–99)
TRIGLYCERIDES: 113 mg/dL (ref <150)
Total CHOL/HDL Ratio: 3.8 RATIO
VLDL: 23 mg/dL (ref 0–40)

## 2018-04-15 MED ORDER — TRAMADOL HCL 50 MG PO TABS: 50.0000 mg | ORAL_TABLET | Freq: Three times a day (TID) | ORAL | 0 refills | Status: AC | PRN

## 2018-04-15 MED ORDER — PANCRELIPASE (LIP-PROT-AMYL) 12000-38000 UNITS PO CPEP: 12000.0000 [IU] | ORAL_CAPSULE | Freq: Three times a day (TID) | ORAL | Status: DC

## 2018-04-15 MED ORDER — PANCRELIPASE (LIP-PROT-AMYL) 12000-38000 UNITS PO CPEP: 12000.0000 [IU] | ORAL_CAPSULE | Freq: Three times a day (TID) | ORAL | 3 refills | Status: DC

## 2018-04-15 MED ORDER — ONDANSETRON 4 MG PO TBDP: 4.0000 mg | ORAL_TABLET | Freq: Three times a day (TID) | ORAL | 0 refills | Status: DC | PRN

## 2018-04-15 NOTE — Progress Notes (Addendum)
Jasmine Davila 11:54 AM  Subjective: Patient doing well without any more pain or nausea and wants to eat and no new complaints  Objective: Vital signs stable afebrile no acute distress abdomen is soft nontender lipase better CBC okay did not recheck liver tests MRI by my interpretation without obvious CBD abnormality and distal pancreatic duct looks okay  Assessment: Questionable idiopathic pancreatitis  Plan: If she tolerates soft solids for lunch okay with me to go home today and I am happy to see her back in follow-up and she has asked her records to be faxed to me and we'll wait on her IgG4 as well and official MRI report and we'll call her with those results unfortunately as an addendum I think to lab only did regular IgG and not the specific Ig 4 as I tried to order and if I don't see one in her records we will order an in our office unless Hospital team knows how to properly ordered that lab  [facility] E  Pager [phone] After 5PM or if no answer call [phone]

## 2018-04-15 NOTE — Discharge Summary (Signed)
Physician Discharge Summary   Patient ID: Jasmine Davila MRN: [id number] DOB/AGE: [birth year] 67 y.o.  Admit date: 04/13/2018 Discharge date: 04/15/2018  Primary Care Physician:  [name], DO   Recommendations for Outpatient Follow-up:  1. Follow up with PCP in 1-2 weeks 2. Please obtain BMP/CBC in one week  Home Health: None  Equipment/Devices: none   Discharge Condition: stable  CODE STATUS: FULL  Diet recommendation: Carb modified low-fat diet   Discharge Diagnoses:    . Acute recurrent idiopathic pancreatitis . H/o Cancer:   B- cell lymphoma (spleen and lung) - non-Hodgkin's . B12 deficiency Diabetes mellitus type 2 Chronic thrombocytopenia  Lymphoma currently in remission History of sleep apnea  Consults: GI, Dr. Watt Climes   Allergies:   Allergies  Allergen Reactions  . Dilaudid [Hydromorphone Hcl] Other (See Comments)    anxious  . Doxycycline Other (See Comments)    Pancreatic issues  . Hydromorphone   . Metronidazole Other (See Comments)    Pt doesn't recall  . Pseudoephedrine Hcl      DISCHARGE MEDICATIONS: Allergies as of 04/15/2018      Reactions   Dilaudid [hydromorphone Hcl] Other (See Comments)   anxious   Doxycycline Other (See Comments)   Pancreatic issues   Hydromorphone    Metronidazole Other (See Comments)   Pt doesn't recall   Pseudoephedrine Hcl       Medication List    TAKE these medications   aspirin EC 81 MG tablet Take 81 mg by mouth at bedtime.   B6 NATURAL 100 MG tablet Generic drug:  pyridoxine Take 200 mg by mouth daily.   cyanocobalamin 1000 MCG/ML injection Commonly known as:  (VITAMIN B-12) Inject 0.5 mLs (500 mcg total) into the muscle every 30 (thirty) days.   famotidine 20 MG tablet Commonly known as:  PEPCID Take 1 tablet (20 mg total) by mouth 2 (two) times daily.   folic acid 268 MCG tablet Commonly known as:  FOLVITE Take 400 mcg by mouth daily.   gabapentin 300 MG capsule Commonly  known as:  NEURONTIN Take 4 capsules (1,200 mg total) by mouth 3 (three) times daily.   insulin lispro 100 UNIT/ML KiwkPen Commonly known as:  HUMALOG KWIKPEN Inject 0.2 mLs (20 Units total) into the skin daily. Daily with largest meal   LANTUS SOLOSTAR 100 UNIT/ML Solostar Pen Generic drug:  Insulin Glargine INJECT 60 UNITS INTO THE SKIN EVERY MORNING.   lipase/protease/amylase 12000 units Cpep capsule Commonly known as:  CREON Take 1 capsule (12,000 Units total) by mouth 3 (three) times daily before meals.   losartan 25 MG tablet Commonly known as:  COZAAR Take 1 tablet (25 mg total) by mouth daily.   multivitamin tablet Take 1 tablet by mouth daily.   niacin 1000 MG CR tablet Commonly known as:  NIASPAN Take 1 tablet (1,000 mg total) by mouth at bedtime. An hour after 81 mg ASA   NONFORMULARY OR COMPOUNDED ITEM [facility]  Peripheral Neuropathy Cream- Bupivacaine 1%, Doxepin 3%, Gabapentin 6%, Pentoxifylline 3%, Topiramate 1% Apply 1-2 grams to affected area 3-4 times daily Qty. 120 gm 3 refills   ondansetron 4 MG disintegrating tablet Commonly known as:  ZOFRAN ODT Take 1 tablet (4 mg total) by mouth every 8 (eight) hours as needed for nausea or vomiting.   traMADol 50 MG tablet Commonly known as:  ULTRAM Take 1 tablet (50 mg total) by mouth every 8 (eight) hours as needed for up to 5 days for severe pain.  Vitamin D-3 5000 units Tabs Take 1 tablet by mouth daily.        Brief H and P: For complete details please refer to admission H and P, but in brief Jasmine Davila a 67 y.o.femalewithhistory of recurrent pancreatitis since 2016, lymphoma in remission, hypertension, diabetes mellitus presents to the ER admits in the [facility] with complaints of chest pressure epigastric discomfort since this morning. Denied any nausea or vomiting prior to coming to the ER. Denied any diarrhea.  ED Course:In the ER labs revealed markedly elevated  lipase of 2400. CT of the abdomen pelvis shows features concerning for acute pancreatitis. Troponin and EKG were unremarkable. Patient was started on fluids and pain relief medications and admitted for acute pancreatitis.   Hospital Course:     Acute pancreatitis, recurrent, possibly idiopathic -Per patient she had recurrent pancreatitis, this is her fourth episode in the last 3 years. -Patient was placed on n.p.o. status, IV fluid hydration, pain control  -Reports cholecystectomy in 2016, CT abdomen showed possibly distal bile duct stricture on nonopaque choledocholithiasis. -GI was consulted, MRCP abdomen showed uncomplicated acute pancreatitis, no pancreatic necrosis or pancreas divisum, extrahepatic biliary dilatation postcholecystectomy and, no evidence of choledocholithiasis.  Triglycerides 113. -Outpatient follow-up with GI, further work-up outpatient to rule out autoimmune pancreatitis  Active Problems: Chronic diarrhea -Per patient, has chronic diarrhea with every meal due to history of chronic pancreatitis, however she is not on pancreas enzyme tablets. Started on Creon 3 times daily.     B- cell lymphoma (spleen and lung) - non-Hodgkin's -Currently in remission    Diabetes mellitus due to underlying condition with diabetic nephropathy, with long-term current use of insulin (HCC) -Continue insulin Lantus and Novolin per home schedule.  Chronic thrombocytopenia No acute issues  Day of Discharge S: Feels better, wants to eat solid diet, no acute issues, no fevers  BP 138/62 (BP Location: Right Arm)   Pulse 72   Temp 97.9 F (36.6 C) (Oral)   Resp 17   Ht 5\' 7"  (1.702 m)   Wt 106.1 kg (234 lb)   SpO2 100%   BMI 36.65 kg/m   Physical Exam: General: Alert and awake oriented x3 not in any acute distress. HEENT: anicteric sclera, pupils reactive to light and accommodation CVS: S1-S2 clear no murmur rubs or gallops Chest: Mild abdominal tenderness in the epigastric  region, no wheezing rales or rhonchi Abdomen: soft nontender, nondistended, normal bowel sounds Extremities: no cyanosis, clubbing or edema noted bilaterally Neuro: Cranial nerves II-XII intact, no focal neurological deficits   The results of significant diagnostics from this hospitalization (including imaging, microbiology, ancillary and laboratory) are listed below for reference.      Procedures/Studies:  Dg Chest 2 View  Result Date: 04/13/2018 CLINICAL DATA:  Patient with acute pancreatitis. Epigastric and chest pain for 6 hours today. EXAM: CHEST - 2 VIEW COMPARISON:  Single-view of the chest 02/15/2015. CT abdomen and pelvis today. FINDINGS: Lungs are clear. Heart size is normal. No pneumothorax or pleural fluid. Aortic atherosclerosis is noted. No acute bony abnormality. IMPRESSION: No acute disease. Atherosclerosis. Electronically Signed   By: Inge Rise M.D.   On: 04/13/2018 16:55   Ct Abdomen Pelvis W Contrast  Result Date: 04/13/2018 CLINICAL DATA:  67 year old female with a history of epigastric abdominal pain for 6 hours. History of B-cell lymphoma. History of pancreatitis EXAM: CT ABDOMEN AND PELVIS WITH CONTRAST TECHNIQUE: Multidetector CT imaging of the abdomen and pelvis was performed using the standard protocol  following bolus administration of intravenous contrast. CONTRAST:  142mL ISOVUE-300 IOPAMIDOL (ISOVUE-300) INJECTION 61% COMPARISON:  CT 02/14/2015 FINDINGS: Lower chest: No acute finding of the lower chest Hepatobiliary: Uniform attenuation/enhancement of liver parenchyma. No focal lesion. Bile ducts mildly dilated at the liver hilum, unchanged from the comparison. Extrahepatic biliary ducts enlarged, with the greatest diameter of the common hepatic duct measuring approximately 19 mm. Surgical changes of cholecystectomy. No radiopaque stones within the biliary duct. Common bile duct tapers distally at the ampulla. Similar appearance of the pancreatic duct compared to  the prior CT. Pancreas: Circumferential hazy fat surrounding the pancreas compatible with inflammatory changes. There are no calcifications associated with the pancreas parenchyma. Uniform enhancement without evidence of necrosis. The portal vein and splenic vein are patent. Unremarkable appearance of the celiac artery. Spleen: Interval resolution of the focal lesions within the spleen parenchyma, with only 1 small hypodense/hypoenhancing focus. No associated inflammatory changes. Adrenals/Urinary Tract: Unremarkable appearance of the adrenal glands. Bilateral kidneys without hydronephrosis or nephrolithiasis. Unremarkable course the bilateral ureters. Unremarkable appearance of the urinary bladder. Stomach/Bowel: Unremarkable appearance of stomach. Unremarkable small bowel. No abnormal distention. No transition point. Normal appendix. Moderate stool burden. No evidence of obstruction of the colon. Mild diverticular disease without evidence of acute diverticulitis. Vascular/Lymphatic: Calcifications of the abdominal aorta and the iliac vessels. The bilateral iliac arteries and proximal femoral arteries are patent. No aneurysm. Reproductive: There is minimal fluid within the endometrial canal, similar to the comparison CT. Unremarkable appearance of the adnexa. Other: Small fat containing umbilical hernia. Musculoskeletal: Negative for acute displaced fracture. Multilevel degenerative changes of the thoracolumbar spine. IMPRESSION: Inflammatory changes surrounding at the pancreas, compatible with acute pancreatitis. Although the extrahepatic biliary ducts are somewhat dilated, the patient has cholecystectomy, and there is no evidence of radiopaque choledocholithiasis. Etiology in this patient with chronic pancreatitis may represent distal bile duct stricture, or potentially non radiopaque choledocholithiasis. Given that there are no coarse calcifications in the setting of chronic pancreatitis, less likely diagnosis  to additionally consider would be autoimmune pancreatitis/IgG4 syndrome. Interval resolution of multiple spleen lesions in this patient with a history of treated lymphoma. Trace fluid within the endometrial canal. Given the patient's age, referral for Ob/gyn evaluation may be considered, to establish benign endometrial thickening. Aortic Atherosclerosis (ICD10-I70.0). Electronically Signed   By: Corrie Mckusick D.O.   On: 04/13/2018 16:52   Mr 3d Recon At Scanner  Result Date: 04/15/2018 CLINICAL DATA:  Abdominal pain. Acute pancreatitis. History of B-cell lymphoma and cholecystectomy. EXAM: MRI ABDOMEN WITHOUT AND WITH CONTRAST (INCLUDING MRCP) TECHNIQUE: Multiplanar multisequence MR imaging of the abdomen was performed both before and after the administration of intravenous contrast. Heavily T2-weighted images of the biliary and pancreatic ducts were obtained, and three-dimensional MRCP images were rendered by post processing. CONTRAST:  6mL MULTIHANCE GADOBENATE DIMEGLUMINE 529 MG/ML IV SOLN COMPARISON:  Abdominopelvic CT 04/13/2018 and 02/14/2015. FINDINGS: Despite efforts by the technologist and patient, mild motion artifact is present on today's exam and could not be eliminated. This reduces exam sensitivity and specificity. Motion is greatest on the post-contrast images. Lower chest: The visualized lower chest appears unremarkable. No significant pleural effusion. Hepatobiliary: The liver demonstrates mild loss of signal on the gradient echo opposed phase images, consistent with mild steatosis. No focal lesion or abnormal enhancement seen. The gallbladder is surgically absent. There is fusiform extrahepatic biliary dilatation. The common hepatic duct measures 17 mm in diameter and the common bile duct 8 mm in diameter. The duct tapers distally. No  evidence of choledocholithiasis. Pancreas: There are inflammatory changes surrounding the pancreas, especially inferiorly. There is no focal fluid collection.  Following contrast, the pancreas enhances normally. There is no pancreatic ductal dilatation. The pancreatic ductal anatomy is normal. Spleen: The spleen is heterogeneous in signal with multiple areas of susceptibility artifact and decreased enhancement. These are angular and likely represent the sequela of treated lymphomatous involvement seen on the 2016 CT. Adrenals/Urinary Tract: Both adrenal glands appear normal. Both kidneys appear normal. No hydronephrosis. Stomach/Bowel: No evidence of bowel wall thickening, distention or surrounding inflammatory change. Vascular/Lymphatic: There are no enlarged abdominal lymph nodes. No significant vascular findings. The portal, superior mesenteric and splenic veins are patent. Other: Retroperitoneal and mesenteric edema without focal fluid collection. No significant ascites. Musculoskeletal: No acute or significant osseous findings. IMPRESSION: 1. Stable findings of uncomplicated acute pancreatitis. No evidence of pancreatic necrosis or pancreas divisum. 2. Extrahepatic biliary dilatation post cholecystectomy. No evidence of choledocholithiasis. 3. Treated lymphoma lesions in the spleen.  No adenopathy. Electronically Signed   By: Richardean Sale M.D.   On: 04/15/2018 12:25   Mr Abdomen Mrcp Moise Boring Contast  Result Date: 04/15/2018 CLINICAL DATA:  Abdominal pain. Acute pancreatitis. History of B-cell lymphoma and cholecystectomy. EXAM: MRI ABDOMEN WITHOUT AND WITH CONTRAST (INCLUDING MRCP) TECHNIQUE: Multiplanar multisequence MR imaging of the abdomen was performed both before and after the administration of intravenous contrast. Heavily T2-weighted images of the biliary and pancreatic ducts were obtained, and three-dimensional MRCP images were rendered by post processing. CONTRAST:  28mL MULTIHANCE GADOBENATE DIMEGLUMINE 529 MG/ML IV SOLN COMPARISON:  Abdominopelvic CT 04/13/2018 and 02/14/2015. FINDINGS: Despite efforts by the technologist and patient, mild motion  artifact is present on today's exam and could not be eliminated. This reduces exam sensitivity and specificity. Motion is greatest on the post-contrast images. Lower chest: The visualized lower chest appears unremarkable. No significant pleural effusion. Hepatobiliary: The liver demonstrates mild loss of signal on the gradient echo opposed phase images, consistent with mild steatosis. No focal lesion or abnormal enhancement seen. The gallbladder is surgically absent. There is fusiform extrahepatic biliary dilatation. The common hepatic duct measures 17 mm in diameter and the common bile duct 8 mm in diameter. The duct tapers distally. No evidence of choledocholithiasis. Pancreas: There are inflammatory changes surrounding the pancreas, especially inferiorly. There is no focal fluid collection. Following contrast, the pancreas enhances normally. There is no pancreatic ductal dilatation. The pancreatic ductal anatomy is normal. Spleen: The spleen is heterogeneous in signal with multiple areas of susceptibility artifact and decreased enhancement. These are angular and likely represent the sequela of treated lymphomatous involvement seen on the 2016 CT. Adrenals/Urinary Tract: Both adrenal glands appear normal. Both kidneys appear normal. No hydronephrosis. Stomach/Bowel: No evidence of bowel wall thickening, distention or surrounding inflammatory change. Vascular/Lymphatic: There are no enlarged abdominal lymph nodes. No significant vascular findings. The portal, superior mesenteric and splenic veins are patent. Other: Retroperitoneal and mesenteric edema without focal fluid collection. No significant ascites. Musculoskeletal: No acute or significant osseous findings. IMPRESSION: 1. Stable findings of uncomplicated acute pancreatitis. No evidence of pancreatic necrosis or pancreas divisum. 2. Extrahepatic biliary dilatation post cholecystectomy. No evidence of choledocholithiasis. 3. Treated lymphoma lesions in the  spleen.  No adenopathy. Electronically Signed   By: Richardean Sale M.D.   On: 04/15/2018 12:25      LAB RESULTS: Basic Metabolic Panel: Recent Labs  Lab 04/14/18 0516 04/15/18 0459  NA 135 141  K 3.9 3.9  CL 99* 102  CO2 28 29  GLUCOSE 99 125*  BUN 9 6  CREATININE 0.60 0.68  CALCIUM 8.7* 9.1   Liver Function Tests: Recent Labs  Lab 04/13/18 1520 04/14/18 0516  AST 35 84*  ALT 32 87*  ALKPHOS 71 103  BILITOT 0.5 0.9  PROT 7.2 6.7  ALBUMIN 3.9 3.4*   Recent Labs  Lab 04/13/18 1520 04/15/18 0459  LIPASE 2,457* 82*   No results for input(s): AMMONIA in the last 168 hours. CBC: Recent Labs  Lab 04/13/18 1520 04/14/18 0516 04/15/18 0459  WBC 8.4 4.9 4.0  NEUTROABS 6.6  --   --   HGB 13.8 12.6 12.3  HCT 39.1 36.9 36.6  MCV 87.5 89.3 89.7  PLT 138* 124* 120*   Cardiac Enzymes: Recent Labs  Lab 04/13/18 1520 04/13/18 2255  TROPONINI <0.03 <0.03   BNP: Invalid input(s): POCBNP CBG: Recent Labs  Lab 04/15/18 0740 04/15/18 1147  GLUCAP 129* 159*      Disposition and Follow-up: Discharge Instructions    Diet Carb Modified   Complete by:  As directed    Discharge instructions   Complete by:  As directed    Soft low-fat diet for the next few days  Please take Creon, 1 capsule 3 times a day before meals   Increase activity slowly   Complete by:  As directed        DISPOSITION: Home   DISCHARGE FOLLOW-UP Follow-up Information    Clarene Essex, MD. Schedule an appointment as soon as possible for a visit in 1 week(s).   Specialty:  Gastroenterology Contact information: [address] [place] [address] [phone]        [name], DO. Schedule an appointment as soon as possible for a visit in 2 week(s).   Specialty:  Family Medicine Contact information: [place] [address] [phone]            Time coordinating discharge:  35 minutes  Signed:   Estill Cotta M.D. [facility] 04/15/2018, 1:44 PM Pager: [phone]

## 2018-04-19 ENCOUNTER — Other Ambulatory Visit: Payer: Self-pay | Admitting: Family Medicine

## 2018-04-19 DIAGNOSIS — K859 Acute pancreatitis without necrosis or infection, unspecified: Secondary | ICD-10-CM | POA: Diagnosis not present

## 2018-04-20 ENCOUNTER — Ambulatory Visit (INDEPENDENT_AMBULATORY_CARE_PROVIDER_SITE_OTHER): Payer: Medicare Other | Admitting: Family Medicine

## 2018-04-20 ENCOUNTER — Encounter: Payer: Self-pay | Admitting: Family Medicine

## 2018-04-20 VITALS — BP 110/70 | HR 72 | Ht 67.0 in | Wt 233.9 lb

## 2018-04-20 DIAGNOSIS — E1142 Type 2 diabetes mellitus with diabetic polyneuropathy: Secondary | ICD-10-CM | POA: Diagnosis not present

## 2018-04-20 DIAGNOSIS — N949 Unspecified condition associated with female genital organs and menstrual cycle: Secondary | ICD-10-CM

## 2018-04-20 DIAGNOSIS — N859 Noninflammatory disorder of uterus, unspecified: Secondary | ICD-10-CM

## 2018-04-20 DIAGNOSIS — M792 Neuralgia and neuritis, unspecified: Secondary | ICD-10-CM

## 2018-04-20 DIAGNOSIS — K861 Other chronic pancreatitis: Secondary | ICD-10-CM

## 2018-04-20 NOTE — Progress Notes (Signed)
Impression and Recommendations:    1. Chronic pancreatitis, unspecified pancreatitis type ([place])   2. Endometrial disorder- trace endometrial canal fluid seen on CT scan recently and in 2016.  No GYN care many yrs, to GYN for TVUS and further w/up prn   3. Diabetic peripheral neuropathy associated with type 2 diabetes mellitus ([place])   4. Neuropathic pain syndrome (non-herpetic)     1. Chronic pancreatitis --Check labs -continue meds. Slowly add in foods.  -if you develop symptoms again, go back to eating soft foods.   2. Endometrial disorder  --referral given to obgyn.   3. Neuropathy  -Per pt, she was prescribed a peripheral neuropathy cream by podiatrist. Continue this. Follow up with Dr. Posey Pronto, [place] neuro in the future for your august 2019 appointment.  Handouts and information provided.   Orders Placed This Encounter  Procedures  . CBC with Differential/Platelet  . Comprehensive metabolic panel  . Lipase  . Ambulatory referral to Gynecology    No orders of the defined types were placed in this encounter.   Gross side effects, risk and benefits, and alternatives of medications and treatment plan in general discussed with patient.  Patient is aware that all medications have potential side effects and we are unable to predict every side effect or drug-drug interaction that may occur.   Patient will call with any questions prior to using medication if they have concerns.  Expresses verbal understanding and consents to current therapy and treatment regimen.  No barriers to understanding were identified.  Red flag symptoms and signs discussed in detail.  Patient expressed understanding regarding what to do in case of emergency\urgent symptoms  Please see AVS handed out to patient at the end of our visit for further patient instructions/ counseling done pertaining to today's office visit.   Return for Will obtain labs today, follow-up chronic care as previously  discussed.    Note: This note was prepared with assistance of Dragon voice recognition software. Occasional wrong-word or sound-a-like substitutions may have occurred due to the inherent limitations of voice recognition software.   This document serves as a record of services personally performed by [name], DO. It was created on her behalf by Mayer Masker, a trained medical scribe. The creation of this record is based on the scribe's personal observations and the provider's statements to them.   I have reviewed the above medical documentation for accuracy and completeness and I concur.  [name] 04/26/18 8:27 AM  --------------------------------------------------------------------------------------------------------------------------------------------------------------------------   Subjective:     HPI: Jasmine Davila is a 67 y.o. female who presents to [place] [facility] today for follow up after recent hospitalization.  She has a h/o recurrent pancreatitis.   She states she felt bad last week and went to the ED for abdominal pain and chest pain. Her Lipase was 2400. She had a CT abd/pelvis which determined she had minimal fluid in her endometrial canal, which is unchanged from prior CT when she had done this done on 02-14-2015 at [facility] in [place] with similar episode of pancreatitis. Also seen then was minimal amount of fluid within endometrial canal which is unchanged from current CT. She was subsequently admitted. She is going to see Dr. Watt Climes, GI, at [facility]. She does not have an appointment with them yet.   Her last OBGYN was in [place], and had benign results. She was told by them to stop following up since age  59.   She stopped smoking over 50 years ago and only smoked for 4 years.     Today, she feels tired but is otherwise okay. She is soft eating currently. Her pain is well-controlled. She  states usually when she eats food she will have a bowel movement shortly after. The hospitalist prescribed her with an enzyme which she has been taking, which has helped her BM's happen less frequently.  She has not been bothered by eating any of her foods.    Neuropathy Per pt, she was prescribed a peripheral neuropathy cream by her podiatrist which significantly helps her pain. She states the only symptoms she has is her foot feeling "heavy".    Wt Readings from Last 3 Encounters:  04/20/18 233 lb 14.4 oz (106.1 kg)  04/13/18 234 lb (106.1 kg)  03/16/18 236 lb (107 kg)   BP Readings from Last 3 Encounters:  04/20/18 110/70  04/15/18 138/62  04/04/18 (!) 141/79   Pulse Readings from Last 3 Encounters:  04/20/18 72  04/15/18 72  04/04/18 75   BMI Readings from Last 3 Encounters:  04/20/18 36.63 kg/m  04/13/18 36.65 kg/m  03/16/18 36.96 kg/m     Patient Care Team    Relationship Specialty Notifications Start End  [name], DO PCP - General Family Medicine  11/24/16   [name], MD Consulting Physician Oncology  11/24/16   Clarene Essex, MD Consulting Physician Gastroenterology  04/20/18   Alda Berthold, DO Consulting Physician Neurology  04/20/18   Edrick Kins, DPM Consulting Physician Podiatry  04/20/18      Patient Active Problem List   Diagnosis Date Noted  . Diabetes mellitus due to underlying condition with diabetic nephropathy, with long-term current use of insulin ([place]) 11/25/2016    Priority: High  . Hypertension associated with diabetes ([place]) 11/24/2016    Priority: High  . H/o Cancer:   B- cell lymphoma (spleen and lung) - non-Hodgkin's 11/24/2016    Priority: High  . Diabetic neuropathy - exaccerbated by chemo 11/24/2016    Priority: High  . Hyperlipidemia associated with type 2 diabetes mellitus ([place]) 11/24/2016    Priority: High  . Endometrial disorder- trace endometrial canal fluid seen on CT scan recently and in 2016.  No GYN care many  yrs, to GYN for TVUS and further w/up prn 04/20/2018    Priority: Medium  . Morbid obesity ([place]) 11/25/2016    Priority: Medium  . Smoking hx 11/24/2016    Priority: Medium  . Chronic pancreatitis ([place]) 11/24/2016    Priority: Medium  . Vitamin B6 deficiency (non anemic) 11/24/2016    Priority: Medium  . GERD (gastroesophageal reflux disease) 11/24/2016    Priority: Low  . B12 deficiency 11/24/2016    Priority: Low  . Diabetic peripheral neuropathy associated with type 2 diabetes mellitus ([place]) 04/20/2018  . Acute pancreatitis 04/13/2018  . Onychomycosis 03/16/2018  . Neuropathic pain syndrome (non-herpetic) 03/16/2018  . Sleep apnea with CPAP 12/29/2016  . History of decreased platelet count 12/29/2016    Past Medical history, Surgical history, Family history, Social history, Allergies and Medications have been entered into the medical record, reviewed and changed as needed.    Current Meds  Medication Sig  . aspirin EC 81 MG tablet Take 81 mg by mouth at bedtime.   . Cholecalciferol (VITAMIN D-3) 5000 units TABS Take 1 tablet by mouth daily.  . cyanocobalamin (,VITAMIN B-12,) 1000 MCG/ML injection Inject 0.5 mLs (500 mcg total) into the  muscle every 30 (thirty) days.  . famotidine (PEPCID) 20 MG tablet Take 1 tablet (20 mg total) by mouth 2 (two) times daily.  . folic acid (FOLVITE) 619 MCG tablet Take 400 mcg by mouth daily.  Marland Kitchen gabapentin (NEURONTIN) 300 MG capsule Take 4 capsules (1,200 mg total) by mouth 3 (three) times daily.  Marland Kitchen HUMALOG KWIKPEN 100 UNIT/ML KiwkPen INJECT 20 UNITS INTO THE SKIN DAILY WITH LARGEST MEAL  . LANTUS SOLOSTAR 100 UNIT/ML Solostar Pen INJECT 60 UNITS INTO THE SKIN EVERY MORNING.  Marland Kitchen lipase/protease/amylase (CREON) 12000 units CPEP capsule Take 1 capsule (12,000 Units total) by mouth 3 (three) times daily before meals.  Marland Kitchen losartan (COZAAR) 25 MG tablet Take 1 tablet (25 mg total) by mouth daily.  . Multiple Vitamin (MULTIVITAMIN) tablet Take 1 tablet  by mouth daily.  . niacin (NIASPAN) 1000 MG CR tablet Take 1 tablet (1,000 mg total) by mouth at bedtime. An hour after 81 mg ASA  . NONFORMULARY OR COMPOUNDED ITEM [facility]  Peripheral Neuropathy Cream- Bupivacaine 1%, Doxepin 3%, Gabapentin 6%, Pentoxifylline 3%, Topiramate 1% Apply 1-2 grams to affected area 3-4 times daily Qty. 120 gm 3 refills  . ondansetron (ZOFRAN ODT) 4 MG disintegrating tablet Take 1 tablet (4 mg total) by mouth every 8 (eight) hours as needed for nausea or vomiting.  . pyridoxine (B6 NATURAL) 100 MG tablet Take 200 mg by mouth daily.  . [EXPIRED] traMADol (ULTRAM) 50 MG tablet Take 1 tablet (50 mg total) by mouth every 8 (eight) hours as needed for up to 5 days for severe pain.    Allergies:  Allergies  Allergen Reactions  . Dilaudid [Hydromorphone Hcl] Other (See Comments)    anxious  . Doxycycline Other (See Comments)    Pancreatic issues  . Hydromorphone   . Metronidazole Other (See Comments)    Pt doesn't recall  . Pseudoephedrine Hcl      Review of Systems:  A fourteen system review of systems was performed and found to be positive as per HPI.   Objective:   Blood pressure 110/70, pulse 72, height 5\' 7"  (1.702 m), weight 233 lb 14.4 oz (106.1 kg), SpO2 98 %. Body mass index is 36.63 kg/m. General:  Well Developed, well nourished, appropriate for stated age.  Neuro:  Alert and oriented,  extra-ocular muscles intact  HEENT:  Normocephalic, atraumatic, neck supple, no carotid bruits appreciated  Skin:  no gross rash, warm, pink. Cardiac:  RRR, S1 S2 Respiratory:  ECTA B/L and A/P, Not using accessory muscles, speaking in full sentences- unlabored. Vascular:  Ext warm, no cyanosis apprec.; cap RF less 2 sec. Psych:  No HI/SI, judgement and insight good, Euthymic mood. Full Affect.

## 2018-04-20 NOTE — Patient Instructions (Addendum)
-Please be very careful with your diet and advance slowly.  If any increase GI symptoms go back to clear liquid diet\ice chips for couple days then advance slowly as you have in the past.   Acute Pancreatitis  Acute pancreatitis is a condition in which the pancreas suddenly becomes irritated and swollen (has inflammation). The pancreas is a gland that is located behind the stomach. It produces enzymes that help to digest food. The pancreas also releases the hormones glucagon and insulin, which help to regulate blood sugar. Damage to the pancreas occurs when the digestive enzymes from the pancreas are activated before they are released into the intestine. Most acute attacks last a couple of days and can cause serious problems. Some people become dehydrated and develop low blood pressure. In severe cases, bleeding into the pancreas can lead to shock and can be life-threatening. The lungs, heart, and kidneys may fail. What are the causes? The most common causes of this condition are:  Alcohol abuse.  Gallstones.  Other causes include:  Certain medicines.  Exposure to certain chemicals.  Infection.  Damage caused by an accident (trauma).  Abdominal surgery.  In some cases, the cause may not be known. What are the signs or symptoms? Symptoms of this condition include:  Pain in the upper abdomen that may radiate to the back.  Tenderness and swelling of the abdomen.  Nausea and vomiting.  How is this diagnosed? This condition may be diagnosed based on:  A physical exam.  Blood tests.  Imaging tests, such as X-rays, CT scans, or an ultrasound of the abdomen.  How is this treated? Treatment for this condition usually requires a stay in the hospital. Treatment may include:  Pain medicine.  Fluid replacement through an IV tube.  Placing a tube in the stomach to remove stomach contents and to control vomiting (NG tube, or nasogastric tube).  Not eating for 3-4 days. This  gives the pancreas a rest, because enzymes are not being produced that can cause further damage.  Antibiotic medicines, if your condition is caused by an infection.  Surgery on the pancreas or gallbladder.  Follow these instructions at home: Eating and drinking  Follow instructions from your health care provider about diet. This may involve avoiding alcohol and decreasing the amount of fat in your diet.  Eat smaller, more frequent meals. This reduces the amount of digestive fluids that the pancreas produces.  Drink enough fluid to keep your urine clear or pale yellow.  Do not drink alcohol if it caused your condition. General instructions  Take over-the-counter and prescription medicines only as told by your health care provider.  Do not use any tobacco products, such as cigarettes, chewing tobacco, and e-cigarettes. If you need help quitting, ask your health care provider.  Get plenty of rest.  If directed, check your blood sugar at home as told by your health care provider.  Keep all follow-up visits as told by your health care provider. This is important. Contact a health care provider if:  You do not recover as quickly as expected.  You develop new or worsening symptoms.  You have persistent pain, weakness, or nausea.  You recover and then have another episode of pain.  You have a fever. Get help right away if:  You cannot eat or keep fluids down.  Your pain becomes severe.  Your skin or the white part of your eyes turns yellow (jaundice).  You vomit.  You feel dizzy or you faint.  Your  blood sugar is high (over 300 mg/dL). This information is not intended to replace advice given to you by your health care provider. Make sure you discuss any questions you have with your health care provider. Document Released: 12/07/2005 Document Revised: 04/15/2016 Document Reviewed: 09/10/2015 Elsevier Interactive Patient Education  [employer].

## 2018-04-21 LAB — COMPREHENSIVE METABOLIC PANEL
ALK PHOS: 89 IU/L (ref 39–117)
ALT: 40 IU/L — AB (ref 0–32)
AST: 31 IU/L (ref 0–40)
Albumin/Globulin Ratio: 1.6 (ref 1.2–2.2)
Albumin: 4.4 g/dL (ref 3.6–4.8)
BUN/Creatinine Ratio: 9 — ABNORMAL LOW (ref 12–28)
BUN: 6 mg/dL — AB (ref 8–27)
Bilirubin Total: 0.4 mg/dL (ref 0.0–1.2)
CO2: 27 mmol/L (ref 20–29)
Calcium: 9.5 mg/dL (ref 8.7–10.3)
Chloride: 98 mmol/L (ref 96–106)
Creatinine, Ser: 0.64 mg/dL (ref 0.57–1.00)
GFR calc Af Amer: 107 mL/min/{1.73_m2} (ref >59)
GFR calc non Af Amer: 93 mL/min/{1.73_m2} (ref >59)
GLOBULIN, TOTAL: 2.8 g/dL (ref 1.5–4.5)
GLUCOSE: 147 mg/dL — AB (ref 65–99)
Potassium: 4.2 mmol/L (ref 3.5–5.2)
SODIUM: 137 mmol/L (ref 134–144)
Total Protein: 7.2 g/dL (ref 6.0–8.5)

## 2018-04-21 LAB — CBC WITH DIFFERENTIAL/PLATELET
BASOS ABS: 0 10*3/uL (ref 0.0–0.2)
Basos: 1 %
EOS (ABSOLUTE): 0.1 10*3/uL (ref 0.0–0.4)
Eos: 2 %
Hematocrit: 38.6 % (ref 34.0–46.6)
Hemoglobin: 13.1 g/dL (ref 11.1–15.9)
IMMATURE GRANULOCYTES: 0 %
Immature Grans (Abs): 0 10*3/uL (ref 0.0–0.1)
LYMPHS ABS: 1.4 10*3/uL (ref 0.7–3.1)
Lymphs: 34 %
MCH: 30.2 pg (ref 26.6–33.0)
MCHC: 33.9 g/dL (ref 31.5–35.7)
MCV: 89 fL (ref 79–97)
MONOS ABS: 0.4 10*3/uL (ref 0.1–0.9)
Monocytes: 9 %
NEUTROS PCT: 54 %
Neutrophils Absolute: 2.2 10*3/uL (ref 1.4–7.0)
PLATELETS: 146 10*3/uL — AB (ref 150–379)
RBC: 4.34 x10E6/uL (ref 3.77–5.28)
RDW: 13.8 % (ref 12.3–15.4)
WBC: 4.1 10*3/uL (ref 3.4–10.8)

## 2018-04-21 LAB — LIPASE: LIPASE: 30 U/L (ref 14–72)

## 2018-05-05 DIAGNOSIS — E1142 Type 2 diabetes mellitus with diabetic polyneuropathy: Secondary | ICD-10-CM | POA: Diagnosis not present

## 2018-05-05 DIAGNOSIS — C8339 Diffuse large B-cell lymphoma, extranodal and solid organ sites: Secondary | ICD-10-CM | POA: Diagnosis not present

## 2018-05-05 DIAGNOSIS — I10 Essential (primary) hypertension: Secondary | ICD-10-CM | POA: Diagnosis not present

## 2018-05-26 ENCOUNTER — Ambulatory Visit (INDEPENDENT_AMBULATORY_CARE_PROVIDER_SITE_OTHER): Payer: Medicare Other | Admitting: Obstetrics and Gynecology

## 2018-05-26 ENCOUNTER — Other Ambulatory Visit: Payer: Self-pay

## 2018-05-26 ENCOUNTER — Encounter: Payer: Self-pay | Admitting: Obstetrics and Gynecology

## 2018-05-26 ENCOUNTER — Other Ambulatory Visit (HOSPITAL_COMMUNITY)
Admission: RE | Admit: 2018-05-26 | Discharge: 2018-05-26 | Disposition: A | Discharge status: Home / Self Care | Payer: Medicare Other | Source: Ambulatory Visit | Attending: Obstetrics and Gynecology | Admitting: Obstetrics and Gynecology

## 2018-05-26 VITALS — BP 122/62 | HR 64 | Resp 14 | Ht 67.0 in | Wt 238.6 lb

## 2018-05-26 DIAGNOSIS — N859 Noninflammatory disorder of uterus, unspecified: Secondary | ICD-10-CM

## 2018-05-26 DIAGNOSIS — Z01419 Encounter for gynecological examination (general) (routine) without abnormal findings: Secondary | ICD-10-CM | POA: Diagnosis not present

## 2018-05-26 DIAGNOSIS — Z124 Encounter for screening for malignant neoplasm of cervix: Secondary | ICD-10-CM | POA: Diagnosis not present

## 2018-05-26 DIAGNOSIS — N816 Rectocele: Secondary | ICD-10-CM | POA: Diagnosis not present

## 2018-05-26 NOTE — Progress Notes (Signed)
67 y.o. [id number] MarriedCaucasianF here for a consultation from Dr Raliegh Scarlet for an incidental finding on CT ABD/Pelvis-- of trace fluid within endometrial canal.  No vaginal bleeding for 19 years. Not sexually active. She hasn't seen a GYN or had a breast or pelvic exam in years. Overdue for annual exam    Patient's last menstrual period was 12/21/1998 (approximate).          Sexually active: No.  The current method of family planning is post menopausal status.    Exercising: No.   Smoker:  no  Health Maintenance: Pap:  7-8 years ago normal per patient History of abnormal Pap:  no MMG:  12/2017 normal per patient--[facility] Colonoscopy:  2014 polyp and family hx of colon cancer--due 09/2018. BMD:   Unsure, will do with her primary TDaP:  unsure Gardasil: no   reports that she quit smoking about 49 years ago. Her smoking use included cigarettes. She has a 2.00 pack-year smoking history. She has never used smokeless tobacco. She reports that she does not drink alcohol or use drugs. She and her husband moved here from [place] to be near [place] kids. One son died in a car accident at 55 in 77. Other son lives here. [place] son is 68, Grand daughter is almost 103.  Past Medical History:  Diagnosis Date  . Acute pancreatitis   . Cancer ([place])    B cell lymphoma- Non Hodgkins in remission  . Diabetic neuropathy ([place])   . Hypertension     Past Surgical History:  Procedure Laterality Date  . CHOLECYSTECTOMY    . MEDIPORT INSERTION, DOUBLE    . MEDIPORT REMOVAL      Current Outpatient Medications  Medication Sig Dispense Refill  . aspirin EC 81 MG tablet Take 81 mg by mouth at bedtime.     . Cholecalciferol (VITAMIN D-3) 5000 units TABS Take 1 tablet by mouth daily.    . cyanocobalamin (,VITAMIN B-12,) 1000 MCG/ML injection Inject 0.5 mLs (500 mcg total) into the muscle every 30 (thirty) days. 10 mL 1  . famotidine (PEPCID) 20 MG tablet Take 1 tablet (20 mg total) by mouth 2 (two) times  daily. 270 tablet 1  . folic acid (FOLVITE) 623 MCG tablet Take 400 mcg by mouth daily.    Marland Kitchen gabapentin (NEURONTIN) 300 MG capsule Take 4 capsules (1,200 mg total) by mouth 3 (three) times daily. 360 capsule 5  . HUMALOG KWIKPEN 100 UNIT/ML KiwkPen INJECT 20 UNITS INTO THE SKIN DAILY WITH LARGEST MEAL 15 mL 1  . LANTUS SOLOSTAR 100 UNIT/ML Solostar Pen INJECT 60 UNITS INTO THE SKIN EVERY MORNING. 30 mL 1  . lipase/protease/amylase (CREON) 12000 units CPEP capsule Take 1 capsule (12,000 Units total) by mouth 3 (three) times daily before meals. 270 capsule 3  . losartan (COZAAR) 25 MG tablet Take 1 tablet (25 mg total) by mouth daily. 90 tablet 1  . Multiple Vitamin (MULTIVITAMIN) tablet Take 1 tablet by mouth daily.    . niacin (NIASPAN) 1000 MG CR tablet Take 1 tablet (1,000 mg total) by mouth at bedtime. An hour after 81 mg ASA 90 tablet 3  . NONFORMULARY OR COMPOUNDED ITEM [facility]  Peripheral Neuropathy Cream- Bupivacaine 1%, Doxepin 3%, Gabapentin 6%, Pentoxifylline 3%, Topiramate 1% Apply 1-2 grams to affected area 3-4 times daily Qty. 120 gm 3 refills 1 each 2  . ondansetron (ZOFRAN ODT) 4 MG disintegrating tablet Take 1 tablet (4 mg total) by mouth every 8 (eight) hours as needed  for nausea or vomiting. 30 tablet 0  . pyridoxine (B6 NATURAL) 100 MG tablet Take 200 mg by mouth daily.     No current facility-administered medications for this visit.     Family History  Problem Relation Age of Onset  . Cancer Mother 38       colon  . Hypertension Mother   . Osteoporosis Mother   . Heart attack Father   . Heart disease Sister   . Alcohol abuse Maternal Grandfather     Review of Systems  Constitutional: Negative.   HENT: Negative.   Eyes: Negative.   Respiratory: Negative.   Cardiovascular: Negative.   Gastrointestinal: Negative.   Endocrine: Negative.   Genitourinary: Negative.   Allergic/Immunologic: Negative.   Neurological: Negative.   Hematological: Negative.    Psychiatric/Behavioral: Negative.     Exam:   BP 122/62   Pulse 64   Resp 14   Ht 5\' 7"  (1.702 m)   Wt 238 lb 9.6 oz (108.2 kg)   LMP 12/21/1998 (Approximate)   BMI 37.37 kg/m   Weight change: @WEIGHTCHANGE @ Height:   Height: 5\' 7"  (170.2 cm)  Ht Readings from Last 3 Encounters:  05/26/18 5\' 7"  (1.702 m)  04/20/18 5\' 7"  (1.702 m)  04/13/18 5\' 7"  (1.702 m)    General appearance: alert, cooperative and appears stated age Head: Normocephalic, without obvious abnormality, atraumatic Neck: no adenopathy, supple, symmetrical, trachea midline and thyroid normal to inspection and palpation Lungs: clear to auscultation bilaterally Cardiovascular: regular rate and rhythm Breasts: normal appearance, no masses or tenderness Abdomen: soft, non-tender; non distended,  no masses,  no organomegaly Extremities: extremities normal, atraumatic, no cyanosis or edema Skin: Skin color, texture, turgor normal. No rashes or lesions Lymph nodes: Cervical, supraclavicular, and axillary nodes normal. No abnormal inguinal nodes palpated Neurologic: Grossly normal   Pelvic: External genitalia:  no lesions              Urethra:  normal appearing urethra with no masses, tenderness or lesions              Bartholins and Skenes: normal                 Vagina: normal appearing vagina with normal color and discharge, no lesions              Cervix: no lesions and not stenotic               Bimanual Exam:  Uterus:  normal size, contour, position, consistency, mobility, non-tender              Adnexa: no mass, fullness, tenderness               Rectovaginal: Confirms               Anus:  normal sphincter tone, no lesions  Chaperone was present for exam.  A:  Annual GYN exam  Incidental finding of fluid in the uterine cavity on CT  Rectocele, not symptomatic  Screening cervical cancer   P:   Return for GYN ultrasound  If her endometrial lining is >3 mm will do an endometrial biopsy  Pap  done  Mammogram UTD  Colonoscopy due in the fall  Labs and immunizations with primary MD  She said she will get her DEXA with her primary as well    CC: Dr Raliegh Scarlet

## 2018-05-27 ENCOUNTER — Encounter: Payer: Self-pay | Admitting: Obstetrics and Gynecology

## 2018-05-30 LAB — CYTOLOGY - PAP: DIAGNOSIS: NEGATIVE

## 2018-06-03 DIAGNOSIS — E119 Type 2 diabetes mellitus without complications: Secondary | ICD-10-CM | POA: Diagnosis not present

## 2018-06-03 DIAGNOSIS — Z8572 Personal history of non-Hodgkin lymphomas: Secondary | ICD-10-CM | POA: Insufficient documentation

## 2018-06-03 DIAGNOSIS — N390 Urinary tract infection, site not specified: Secondary | ICD-10-CM | POA: Diagnosis not present

## 2018-06-03 DIAGNOSIS — R300 Dysuria: Secondary | ICD-10-CM | POA: Diagnosis not present

## 2018-06-03 DIAGNOSIS — G629 Polyneuropathy, unspecified: Secondary | ICD-10-CM | POA: Insufficient documentation

## 2018-06-07 ENCOUNTER — Other Ambulatory Visit: Payer: Medicare Other

## 2018-06-07 ENCOUNTER — Other Ambulatory Visit: Payer: Medicare Other | Admitting: Obstetrics and Gynecology

## 2018-06-08 ENCOUNTER — Encounter: Payer: Self-pay | Admitting: Family Medicine

## 2018-06-08 ENCOUNTER — Ambulatory Visit (INDEPENDENT_AMBULATORY_CARE_PROVIDER_SITE_OTHER): Payer: Medicare Other | Admitting: Family Medicine

## 2018-06-08 VITALS — BP 110/58 | HR 70 | Ht 67.0 in | Wt 234.0 lb

## 2018-06-08 DIAGNOSIS — R300 Dysuria: Secondary | ICD-10-CM | POA: Diagnosis not present

## 2018-06-08 DIAGNOSIS — R1030 Lower abdominal pain, unspecified: Secondary | ICD-10-CM | POA: Diagnosis not present

## 2018-06-08 LAB — POCT URINALYSIS DIPSTICK
Bilirubin, UA: NEGATIVE
GLUCOSE UA: NEGATIVE
Ketones, UA: NEGATIVE
Nitrite, UA: NEGATIVE
Protein, UA: NEGATIVE
RBC UA: NEGATIVE
Spec Grav, UA: 1.025 (ref 1.010–1.025)
Urobilinogen, UA: 0.2 E.U./dL
pH, UA: 6 (ref 5.0–8.0)

## 2018-06-08 MED ORDER — PHENAZOPYRIDINE HCL 200 MG PO TABS: 200.0000 mg | ORAL_TABLET | Freq: Three times a day (TID) | ORAL | 0 refills | Status: AC | PRN

## 2018-06-08 NOTE — Progress Notes (Signed)
This is an acute office visit today.  Impression and Recommendations:    1. Lower abdominal pain   2. Dysuria     1. Lower abdominal pain/dysuria -Patient recently completed 3 days of antibiotics with Bactrim-finished course this past Sunday, 3 days ago with little relief -Prudent antibiotic use discussed with patient she prefers to wait for culture results before trying Cipro or other -start phenazopyridium prn pain/discomfort until UCx comes back. -Check UA. UA shows WBC only, will send for culture and await results. Treat Sx with phenazopyridium -take tylenol or ibuprofen for pain.  -push fluids, get plenty of rest.  -drink adequate amounts of water, equal to half of your wt in oz per day.     Education and routine counseling performed. Handouts provided.  Orders Placed This Encounter  Procedures  . Urine Culture  . POCT urinalysis dipstick    Meds ordered this encounter  Medications  . phenazopyridine (PYRIDIUM) 200 MG tablet    Sig: Take 1 tablet (200 mg total) by mouth 3 (three) times daily as needed for up to 5 days for pain.    Dispense:  15 tablet    Refill:  0    The patient was counseled, risk factors were discussed, anticipatory guidance given.  Gross side effects, risk and benefits, and alternatives of medications discussed with patient.  Patient is aware that all medications have potential side effects and we are unable to predict every side effect or drug-drug interaction that may occur.  Expresses verbal understanding and consents to current therapy plan and treatment regimen.   Return if symptoms worsen or fail to improve, for Keep chronic follow-up office visits as previously discussed.   This document serves as a record of services personally performed by [name], DO. It was created on her behalf by Mayer Masker, a trained medical scribe. The creation of this record is based on the scribe's personal observations and the provider's  statements to them.   I have reviewed the above medical documentation for accuracy and completeness and I concur.  [name] 06/08/18 11:32 AM    Subjective:    HPI: Jasmine Davila is a 67 y.o. female who presents to [facility] today for c/o urinary pressure, minor itching, dysuria (more burning, less itching), and lower abdominal pain.   She went to [place] urgent care 5 days ago for similar symptoms. She was given bactrim for 3 days. It has not helped, she has not felt "right" yet.   Sx for 5-7 days.      Denies: Fever, chills, back pain, hematuria, pus or colored urine, and vaginal discharge. Further denies N/V/D.    She has had 1 UTI in the last year and has used cipro in the past. She is currently not taking pyridium or azo.   Her sx do not feel like pancreatitis.   Urinalysis    Component Value Date/Time   COLORURINE YELLOW 04/13/2018 2203   APPEARANCEUR CLEAR 04/13/2018 2203   LABSPEC >1.046 (H) 04/13/2018 2203   PHURINE 7.0 04/13/2018 2203   GLUCOSEU NEGATIVE 04/13/2018 2203   HGBUR NEGATIVE 04/13/2018 2203   BILIRUBINUR negative 06/08/2018 1116   KETONESUR NEGATIVE 04/13/2018 2203   PROTEINUR Negative 06/08/2018 1116   PROTEINUR NEGATIVE 04/13/2018 2203   UROBILINOGEN 0.2 06/08/2018 1116   NITRITE negative 06/08/2018 1116   NITRITE NEGATIVE 04/13/2018 2203   LEUKOCYTESUR Small (1+) (A) 06/08/2018 1116    Wt Readings from  Last 3 Encounters:  06/08/18 234 lb (106.1 kg)  05/26/18 238 lb 9.6 oz (108.2 kg)  04/20/18 233 lb 14.4 oz (106.1 kg)   BP Readings from Last 3 Encounters:  06/08/18 (!) 110/58  05/26/18 122/62  04/20/18 110/70   Pulse Readings from Last 3 Encounters:  06/08/18 70  05/26/18 64  04/20/18 72   BMI Readings from Last 3 Encounters:  06/08/18 36.65 kg/m  05/26/18 37.37 kg/m  04/20/18 36.63 kg/m     Patient Active Problem List   Diagnosis Date Noted  . Diabetes mellitus due to  underlying condition with diabetic nephropathy, with long-term current use of insulin ([place]) 11/25/2016    Priority: High  . Hypertension associated with diabetes ([place]) 11/24/2016    Priority: High  . H/o Cancer:   B- cell lymphoma (spleen and lung) - non-Hodgkin's 11/24/2016    Priority: High  . Diabetic neuropathy - exaccerbated by chemo 11/24/2016    Priority: High  . Hyperlipidemia associated with type 2 diabetes mellitus ([place]) 11/24/2016    Priority: High  . Endometrial disorder- trace endometrial canal fluid seen on CT scan recently and in 2016.  No GYN care many yrs, to GYN for TVUS and further w/up prn 04/20/2018    Priority: Medium  . Morbid obesity ([place]) 11/25/2016    Priority: Medium  . Smoking hx 11/24/2016    Priority: Medium  . Chronic pancreatitis ([place]) 11/24/2016    Priority: Medium  . Vitamin B6 deficiency (non anemic) 11/24/2016    Priority: Medium  . GERD (gastroesophageal reflux disease) 11/24/2016    Priority: Low  . B12 deficiency 11/24/2016    Priority: Low  . Diabetic peripheral neuropathy associated with type 2 diabetes mellitus ([place]) 04/20/2018  . Acute pancreatitis 04/13/2018  . Onychomycosis 03/16/2018  . Neuropathic pain syndrome (non-herpetic) 03/16/2018  . Sleep apnea with CPAP 12/29/2016  . History of decreased platelet count 12/29/2016    Past Surgical History:  Procedure Laterality Date  . CHOLECYSTECTOMY    . MEDIPORT INSERTION, DOUBLE    . MEDIPORT REMOVAL      Family History  Problem Relation Age of Onset  . Cancer Mother 17       colon  . Hypertension Mother   . Osteoporosis Mother   . Heart attack Father   . Heart disease Sister   . Alcohol abuse Maternal Grandfather     Social History   Substance and Sexual Activity  Drug Use No  ,  Social History   Substance and Sexual Activity  Alcohol Use No  ,  Social History   Tobacco Use  Smoking Status Former Smoker  . Packs/day: 0.50  . Years: 4.00  . Pack years: 2.00  .  Types: Cigarettes  . Last attempt to quit: 12/21/1968  . Years since quitting: 49.4  Smokeless Tobacco Never Used  ,  Social History   Substance and Sexual Activity  Sexual Activity Not Currently  . Birth control/protection: None    Patient's Medications  New Prescriptions   PHENAZOPYRIDINE (PYRIDIUM) 200 MG TABLET    Take 1 tablet (200 mg total) by mouth 3 (three) times daily as needed for up to 5 days for pain.  Previous Medications   ASPIRIN EC 81 MG TABLET    Take 81 mg by mouth at bedtime.    CHOLECALCIFEROL (VITAMIN D-3) 5000 UNITS TABS    Take 1 tablet by mouth daily.   CYANOCOBALAMIN (,VITAMIN B-12,) 1000 MCG/ML INJECTION    Inject 0.5 mLs (  500 mcg total) into the muscle every 30 (thirty) days.   FAMOTIDINE (PEPCID) 20 MG TABLET    Take 1 tablet (20 mg total) by mouth 2 (two) times daily.   FOLIC ACID (FOLVITE) 101 MCG TABLET    Take 400 mcg by mouth daily.   GABAPENTIN (NEURONTIN) 300 MG CAPSULE    Take 4 capsules (1,200 mg total) by mouth 3 (three) times daily.   HUMALOG KWIKPEN 100 UNIT/ML KIWKPEN    INJECT 20 UNITS INTO THE SKIN DAILY WITH LARGEST MEAL   LANTUS SOLOSTAR 100 UNIT/ML SOLOSTAR PEN    INJECT 60 UNITS INTO THE SKIN EVERY MORNING.   LIPASE/PROTEASE/AMYLASE (CREON) 12000 UNITS CPEP CAPSULE    Take 1 capsule (12,000 Units total) by mouth 3 (three) times daily before meals.   LOSARTAN (COZAAR) 25 MG TABLET    Take 1 tablet (25 mg total) by mouth daily.   MULTIPLE VITAMIN (MULTIVITAMIN) TABLET    Take 1 tablet by mouth daily.   NIACIN (NIASPAN) 1000 MG CR TABLET    Take 1 tablet (1,000 mg total) by mouth at bedtime. An hour after 81 mg ASA   NONFORMULARY OR COMPOUNDED ITEM    [facility]  Peripheral Neuropathy Cream- Bupivacaine 1%, Doxepin 3%, Gabapentin 6%, Pentoxifylline 3%, Topiramate 1% Apply 1-2 grams to affected area 3-4 times daily Qty. 120 gm 3 refills   ONDANSETRON (ZOFRAN ODT) 4 MG DISINTEGRATING TABLET    Take 1 tablet (4 mg total) by mouth every 8  (eight) hours as needed for nausea or vomiting.   PYRIDOXINE (B6 NATURAL) 100 MG TABLET    Take 200 mg by mouth daily.  Modified Medications   No medications on file  Discontinued Medications   No medications on file    Dilaudid [hydromorphone hcl]; Doxycycline; Hydromorphone; Metronidazole; and Pseudoephedrine hcl  Current Meds  Medication Sig  . aspirin EC 81 MG tablet Take 81 mg by mouth at bedtime.   . Cholecalciferol (VITAMIN D-3) 5000 units TABS Take 1 tablet by mouth daily.  . cyanocobalamin (,VITAMIN B-12,) 1000 MCG/ML injection Inject 0.5 mLs (500 mcg total) into the muscle every 30 (thirty) days.  . famotidine (PEPCID) 20 MG tablet Take 1 tablet (20 mg total) by mouth 2 (two) times daily.  . folic acid (FOLVITE) 751 MCG tablet Take 400 mcg by mouth daily.  Marland Kitchen gabapentin (NEURONTIN) 300 MG capsule Take 4 capsules (1,200 mg total) by mouth 3 (three) times daily.  Marland Kitchen HUMALOG KWIKPEN 100 UNIT/ML KiwkPen INJECT 20 UNITS INTO THE SKIN DAILY WITH LARGEST MEAL  . LANTUS SOLOSTAR 100 UNIT/ML Solostar Pen INJECT 60 UNITS INTO THE SKIN EVERY MORNING.  Marland Kitchen lipase/protease/amylase (CREON) 12000 units CPEP capsule Take 1 capsule (12,000 Units total) by mouth 3 (three) times daily before meals.  Marland Kitchen losartan (COZAAR) 25 MG tablet Take 1 tablet (25 mg total) by mouth daily.  . Multiple Vitamin (MULTIVITAMIN) tablet Take 1 tablet by mouth daily.  . niacin (NIASPAN) 1000 MG CR tablet Take 1 tablet (1,000 mg total) by mouth at bedtime. An hour after 81 mg ASA  . NONFORMULARY OR COMPOUNDED ITEM [facility]  Peripheral Neuropathy Cream- Bupivacaine 1%, Doxepin 3%, Gabapentin 6%, Pentoxifylline 3%, Topiramate 1% Apply 1-2 grams to affected area 3-4 times daily Qty. 120 gm 3 refills  . ondansetron (ZOFRAN ODT) 4 MG disintegrating tablet Take 1 tablet (4 mg total) by mouth every 8 (eight) hours as needed for nausea or vomiting.  . pyridoxine (B6 NATURAL) 100 MG tablet Take  200 mg by mouth daily.     Review of Systems: General:   No F/C, wt loss Pulm:   No DIB, pleuritic chest pain Card:  No CP, palpitations Abd:  No n/v/d or pain GU:  Dysuria, increased frequency and urgency; no vaginal discharge Ext:  No inc edema from baseline   Objective:  Blood pressure (!) 110/58, pulse 70, height 5\' 7"  (1.702 m), weight 234 lb (106.1 kg), last menstrual period 12/21/1998, SpO2 98 %. Body mass index is 36.65 kg/m.  General: Well Developed, well nourished, and in no acute distress.  HEENT: Normocephalic, atraumatic Skin: Warm and dry, cap RF less 2 sec, good turgor CV: +S1, S2 Respiratory: ECTA B/L; speaking in full sentences, no conversational dyspnea Abd: Soft, ND, No G/R/R, no SPT, No flank pain. -mildly tender to deep palpation in the suprapubic region and R adnaxal region NeuroM-Sk: Ambulates w/o assistance, moves * 4 Psych: A and O *3

## 2018-06-08 NOTE — Patient Instructions (Signed)
Please increase intake of fluids-water over the next several days.  Please take Tylenol and/or Advil\ibuprofen over-the-counter as needed for discomfort.  Urinary culture usually takes 3-5 days to complete.  We will call you on Monday if you have not heard from [place] by noon time on Monday, please call us first thing in the afternoon   Pyelonephritis, Adult Pyelonephritis is a kidney infection. The kidneys are the organs that filter a person's blood and move waste out of the bloodstream and into the urine. Urine passes from the kidneys, through the ureters, and into the bladder. There are two main types of pyelonephritis:  Infections that come on quickly without any warning (acute pyelonephritis).  Infections that last for a long period of time (chronic pyelonephritis).  In most cases, the infection clears up with treatment and does not cause further problems. More severe infections or chronic infections can sometimes spread to the bloodstream or lead to other problems with the kidneys. What are the causes? This condition is usually caused by:  Bacteria traveling from the bladder to the kidney through infected urine. The urine in the bladder can become infected with bacteria from: ? Bladder infection (cystitis). ? Inflammation of the prostate gland (prostatitis). ? Sexual intercourse, in females.  Bacteria traveling from the bloodstream to the kidney.  What increases the risk? This condition is more likely to develop in:  Pregnant women.  Older people.  People who have diabetes.  People who have kidney stones or bladder stones.  People who have other abnormalities of the kidney or ureter.  People who have a catheter placed in the bladder.  People who have cancer.  People who are sexually active.  Women who use spermicides.  People who have had a prior urinary tract infection.  What are the signs or symptoms? Symptoms of this condition include:  Frequent  urination.  Strong or persistent urge to urinate.  Burning or stinging when urinating.  Abdominal pain.  Back pain.  Pain in the side or flank area.  Fever.  Chills.  Blood in the urine, or dark urine.  Nausea.  Vomiting.  How is this diagnosed? This condition may be diagnosed based on:  Medical history and physical exam.  Urine tests.  Blood tests.  You may also have imaging tests of the kidneys, such as an ultrasound or CT scan. How is this treated? Treatment for this condition may depend on the severity of the infection.  If the infection is mild and is found early, you may be treated with antibiotic medicines taken by mouth. You will need to drink fluids to remain hydrated.  If the infection is more severe, you may need to stay in the hospital and receive antibiotics given directly into a vein through an IV tube. You may also need to receive fluids through an IV tube if you are not able to remain hydrated. After your hospital stay, you may need to take oral antibiotics for a period of time.  Other treatments may be required, depending on the cause of the infection. Follow these instructions at home: Medicines  Take over-the-counter and prescription medicines only as told by your health care provider.  If you were prescribed an antibiotic medicine, take it as told by your health care provider. Do not stop taking the antibiotic even if you start to feel better. General instructions  Drink enough fluid to keep your urine clear or pale yellow.  Avoid caffeine, tea, and carbonated beverages. They tend to irritate the bladder.  Urinate often. Avoid holding in urine for long periods of time.  Urinate before and after sex.  After a bowel movement, women should cleanse from front to back. Use each tissue only once.  Keep all follow-up visits as told by your health care provider. This is important. Contact a health care provider if:  Your symptoms do not get  better after 2 days of treatment.  Your symptoms get worse.  You have a fever. Get help right away if:  You are unable to take your antibiotics or fluids.  You have shaking chills.  You vomit.  You have severe flank or back pain.  You have extreme weakness or fainting. This information is not intended to replace advice given to you by your health care provider. Make sure you discuss any questions you have with your health care provider. Document Released: 12/07/2005 Document Revised: 05/14/2016 Document Reviewed: 04/01/2015 Elsevier Interactive Patient Education  [employer].

## 2018-06-12 LAB — URINE CULTURE

## 2018-06-14 ENCOUNTER — Other Ambulatory Visit: Payer: Self-pay

## 2018-06-14 ENCOUNTER — Encounter: Payer: Self-pay | Admitting: Obstetrics and Gynecology

## 2018-06-14 ENCOUNTER — Ambulatory Visit (INDEPENDENT_AMBULATORY_CARE_PROVIDER_SITE_OTHER): Payer: Medicare Other | Admitting: Obstetrics and Gynecology

## 2018-06-14 ENCOUNTER — Ambulatory Visit (INDEPENDENT_AMBULATORY_CARE_PROVIDER_SITE_OTHER): Payer: Medicare Other

## 2018-06-14 VITALS — BP 138/78 | HR 68 | Resp 18 | Wt 238.0 lb

## 2018-06-14 DIAGNOSIS — N859 Noninflammatory disorder of uterus, unspecified: Secondary | ICD-10-CM | POA: Diagnosis not present

## 2018-06-14 DIAGNOSIS — N840 Polyp of corpus uteri: Secondary | ICD-10-CM

## 2018-06-14 NOTE — Patient Instructions (Signed)
Hysteroscopy  Hysteroscopy is a procedure used for looking inside the womb (uterus). It may be done for various reasons, including:  · To evaluate abnormal bleeding, fibroid (benign, noncancerous) tumors, polyps, scar tissue (adhesions), and possibly cancer of the uterus.  · To look for lumps (tumors) and other uterine growths.  · To look for causes of why a woman cannot get pregnant (infertility), causes of recurrent loss of pregnancy (miscarriages), or a lost intrauterine device (IUD).  · To perform a sterilization by blocking the fallopian tubes from inside the uterus.    In this procedure, a thin, flexible tube with a tiny light and camera on the end of it (hysteroscope) is used to look inside the uterus. A hysteroscopy should be done right after a menstrual period to be sure you are not pregnant.  LET YOUR HEALTH CARE PROVIDER KNOW ABOUT:  · Any allergies you have.  · All medicines you are taking, including vitamins, herbs, eye drops, creams, and over-the-counter medicines.  · Previous problems you or members of your family have had with the use of anesthetics.  · Any blood disorders you have.  · Previous surgeries you have had.  · Medical conditions you have.  RISKS AND COMPLICATIONS  Generally, this is a safe procedure. However, as with any procedure, complications can occur. Possible complications include:  · Putting a hole in the uterus.  · Excessive bleeding.  · Infection.  · Damage to the cervix.  · Injury to other organs.  · Allergic reaction to medicines.  · Too much fluid used in the uterus for the procedure.    BEFORE THE PROCEDURE  · Ask your health care provider about changing or stopping any regular medicines.  · Do not take aspirin or blood thinners for 1 week before the procedure, or as directed by your health care provider. These can cause bleeding.  · If you smoke, do not smoke for 2 weeks before the procedure.  · In some cases, a medicine is placed in the cervix the day before the procedure.  This medicine makes the cervix have a larger opening (dilate). This makes it easier for the instrument to be inserted into the uterus during the procedure.  · Do not eat or drink anything for at least 8 hours before the surgery.  · Arrange for someone to take you home after the procedure.  PROCEDURE  · You may be given a medicine to relax you (sedative). You may also be given one of the following:  ? A medicine that numbs the area around the cervix (local anesthetic).  ? A medicine that makes you sleep through the procedure (general anesthetic).  · The hysteroscope is inserted through the vagina into the uterus. The camera on the hysteroscope sends a picture to a TV screen. This gives the surgeon a good view inside the uterus.  · During the procedure, air or a liquid is put into the uterus, which allows the surgeon to see better.  · Sometimes, tissue is gently scraped from inside the uterus. These tissue samples are sent to a lab for testing.  What to expect after the procedure  · If you had a general anesthetic, you may be groggy for a couple hours after the procedure.  · If you had a local anesthetic, you will be able to go home as soon as you are stable and feel ready.  · You may have some cramping. This normally lasts for a couple days.  · You may   have bleeding, which varies from light spotting for a few days to menstrual-like bleeding for 3-7 days. This is normal.  · If your test results are not back during the visit, make an appointment with your health care provider to find out the results.  This information is not intended to replace advice given to you by your health care provider. Make sure you discuss any questions you have with your health care provider.  Document Released: 03/15/2001 Document Revised: 05/14/2016 Document Reviewed: 07/06/2013  Elsevier Interactive Patient Education © 2017 Elsevier Inc.

## 2018-06-14 NOTE — Progress Notes (Deleted)
67 y.o. G2P2001 Married{Race/ethnicity:17218}F here for annual exam.      Patient's last menstrual period was 12/21/1998 (approximate).          Sexually active: {yes no:314532}  The current method of family planning is {contraception:315051}.    Exercising: {yes no:314532}  {types:19826} Smoker:  {YES NO:22349}  Health Maintenance: Pap:  *** History of abnormal Pap:  {YES NO:22349} MMG:  *** Colonoscopy:  *** BMD:   *** TDaP:  *** Gardasil: ***   reports that she quit smoking about 49 years ago. Her smoking use included cigarettes. She has a 2.00 pack-year smoking history. She has never used smokeless tobacco. She reports that she does not drink alcohol or use drugs.  Past Medical History:  Diagnosis Date  . Acute pancreatitis   . Cancer ([place])    B cell lymphoma- Non Hodgkins in remission  . Diabetic neuropathy ([place])   . Hypertension     Past Surgical History:  Procedure Laterality Date  . CHOLECYSTECTOMY    . MEDIPORT INSERTION, DOUBLE    . MEDIPORT REMOVAL      Current Outpatient Medications  Medication Sig Dispense Refill  . aspirin EC 81 MG tablet Take 81 mg by mouth at bedtime.     . Cholecalciferol (VITAMIN D-3) 5000 units TABS Take 1 tablet by mouth daily.    . cyanocobalamin (,VITAMIN B-12,) 1000 MCG/ML injection Inject 0.5 mLs (500 mcg total) into the muscle every 30 (thirty) days. 10 mL 1  . famotidine (PEPCID) 20 MG tablet Take 1 tablet (20 mg total) by mouth 2 (two) times daily. 312 tablet 1  . folic acid (FOLVITE) 811 MCG tablet Take 400 mcg by mouth daily.    Marland Kitchen gabapentin (NEURONTIN) 300 MG capsule Take 4 capsules (1,200 mg total) by mouth 3 (three) times daily. 360 capsule 5  . HUMALOG KWIKPEN 100 UNIT/ML KiwkPen INJECT 20 UNITS INTO THE SKIN DAILY WITH LARGEST MEAL 15 mL 1  . LANTUS SOLOSTAR 100 UNIT/ML Solostar Pen INJECT 60 UNITS INTO THE SKIN EVERY MORNING. 30 mL 1  . lipase/protease/amylase (CREON) 12000 units CPEP capsule Take 1 capsule (12,000  Units total) by mouth 3 (three) times daily before meals. 270 capsule 3  . losartan (COZAAR) 25 MG tablet Take 1 tablet (25 mg total) by mouth daily. 90 tablet 1  . Multiple Vitamin (MULTIVITAMIN) tablet Take 1 tablet by mouth daily.    . niacin (NIASPAN) 1000 MG CR tablet Take 1 tablet (1,000 mg total) by mouth at bedtime. An hour after 81 mg ASA 90 tablet 3  . NONFORMULARY OR COMPOUNDED ITEM [facility]  Peripheral Neuropathy Cream- Bupivacaine 1%, Doxepin 3%, Gabapentin 6%, Pentoxifylline 3%, Topiramate 1% Apply 1-2 grams to affected area 3-4 times daily Qty. 120 gm 3 refills 1 each 2  . ondansetron (ZOFRAN ODT) 4 MG disintegrating tablet Take 1 tablet (4 mg total) by mouth every 8 (eight) hours as needed for nausea or vomiting. 30 tablet 0  . pyridoxine (B6 NATURAL) 100 MG tablet Take 200 mg by mouth daily.     No current facility-administered medications for this visit.     Family History  Problem Relation Age of Onset  . Cancer Mother 71       colon  . Hypertension Mother   . Osteoporosis Mother   . Heart attack Father   . Heart disease Sister   . Alcohol abuse Maternal Grandfather     Review of Systems  Exam:   LMP 12/21/1998 (Approximate)  Weight change: @WEIGHTCHANGE @ Height:      Ht Readings from Last 3 Encounters:  06/08/18 5\' 7"  (1.702 m)  05/26/18 5\' 7"  (1.702 m)  04/20/18 5\' 7"  (1.702 m)    General appearance: alert, cooperative and appears stated age Head: Normocephalic, without obvious abnormality, atraumatic Neck: no adenopathy, supple, symmetrical, trachea midline and thyroid {CHL AMB PHY EX THYROID NORM DEFAULT:[phone]::"normal to inspection and palpation"} Lungs: clear to auscultation bilaterally Cardiovascular: regular rate and rhythm Breasts: {Exam; breast:13139::"normal appearance, no masses or tenderness"} Abdomen: soft, non-tender; non distended,  no masses,  no organomegaly Extremities: extremities normal, atraumatic, no cyanosis or  edema Skin: Skin color, texture, turgor normal. No rashes or lesions Lymph nodes: Cervical, supraclavicular, and axillary nodes normal. No abnormal inguinal nodes palpated Neurologic: Grossly normal   Pelvic: External genitalia:  no lesions              Urethra:  normal appearing urethra with no masses, tenderness or lesions              Bartholins and Skenes: normal                 Vagina: normal appearing vagina with normal color and discharge, no lesions              Cervix: {CHL AMB PHY EX CERVIX NORM DEFAULT:[phone]::"no lesions"}               Bimanual Exam:  Uterus:  {CHL AMB PHY EX UTERUS NORM [phone]::"normal size, contour, position, consistency, mobility, non-tender"}              Adnexa: {CHL AMB PHY EX ADNEXA NO MASS DEFAULT:[phone]::"no mass, fullness, tenderness"}               Rectovaginal: Confirms               Anus:  normal sphincter tone, no lesions  Chaperone was present for exam.  A:  Well Woman with normal exam  P:

## 2018-06-14 NOTE — Progress Notes (Signed)
GYNECOLOGY  VISIT   HPI: 67 y.o.   Married  Caucasian  female   G2P2001 with Patient's last menstrual period was 12/21/1998 (approximate).  here for follow up Fluid in endometrial cavity, this was an incidental finding on CT scan. No vaginal bleeding, not sexually active.    GYNECOLOGIC HISTORY: Patient's last menstrual period was 12/21/1998 (approximate). Contraception:postmenopause  Menopausal hormone therapy: none  Pap 05/26/18: negative        OB History    Gravida  2   Para  2   Term  2   Preterm      AB      Living  1     SAB      TAB      Ectopic      Multiple      Live Births  2              Patient Active Problem List   Diagnosis Date Noted  . Endometrial disorder- trace endometrial canal fluid seen on CT scan recently and in 2016.  No GYN care many yrs, to GYN for TVUS and further w/up prn 04/20/2018  . Diabetic peripheral neuropathy associated with type 2 diabetes mellitus ([place]) 04/20/2018  . Acute pancreatitis 04/13/2018  . Onychomycosis 03/16/2018  . Neuropathic pain syndrome (non-herpetic) 03/16/2018  . Sleep apnea with CPAP 12/29/2016  . History of decreased platelet count 12/29/2016  . Morbid obesity ([place]) 11/25/2016  . Diabetes mellitus due to underlying condition with diabetic nephropathy, with long-term current use of insulin ([place]) 11/25/2016  . Smoking hx 11/24/2016  . Hypertension associated with diabetes ([place]) 11/24/2016  . Chronic pancreatitis ([place]) 11/24/2016  . 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 Diabetic neuropathy - exaccerbated by chemo 11/24/2016  . GERD (gastroesophageal reflux disease) 11/24/2016  . Hyperlipidemia associated with type 2 diabetes mellitus ([place]) 11/24/2016  . B12 deficiency 11/24/2016  . Vitamin B6 deficiency (non anemic) 11/24/2016    Past Medical History:  Diagnosis Date  . Acute pancreatitis   . Cancer ([place])    B cell lymphoma- Non Hodgkins in remission  .  Diabetic neuropathy ([place])   . Hypertension     Past Surgical History:  Procedure Laterality Date  . CHOLECYSTECTOMY    . MEDIPORT INSERTION, DOUBLE    . MEDIPORT REMOVAL      Current Outpatient Medications  Medication Sig Dispense Refill  . aspirin EC 81 MG tablet Take 81 mg by mouth at bedtime.     . Cholecalciferol (VITAMIN D-3) 5000 units TABS Take 1 tablet by mouth daily.    . cyanocobalamin (,VITAMIN B-12,) 1000 MCG/ML injection Inject 0.5 mLs (500 mcg total) into the muscle every 30 (thirty) days. 10 mL 1  . famotidine (PEPCID) 20 MG tablet Take 1 tablet (20 mg total) by mouth 2 (two) times daily. 245 tablet 1  . folic acid (FOLVITE) 809 MCG tablet Take 400 mcg by mouth daily.    Marland Kitchen gabapentin (NEURONTIN) 300 MG capsule Take 4 capsules (1,200 mg total) by mouth 3 (three) times daily. 360 capsule 5  . HUMALOG KWIKPEN 100 UNIT/ML KiwkPen INJECT 20 UNITS INTO THE SKIN DAILY WITH LARGEST MEAL 15 mL 1  . LANTUS SOLOSTAR 100 UNIT/ML Solostar Pen INJECT 60 UNITS INTO THE SKIN EVERY MORNING. 30 mL 1  . lipase/protease/amylase (CREON) 12000 units CPEP capsule Take 1 capsule (12,000 Units total) by mouth 3 (three) times daily before meals. 270 capsule 3  .  losartan (COZAAR) 25 MG tablet Take 1 tablet (25 mg total) by mouth daily. 90 tablet 1  . Multiple Vitamin (MULTIVITAMIN) tablet Take 1 tablet by mouth daily.    . niacin (NIASPAN) 1000 MG CR tablet Take 1 tablet (1,000 mg total) by mouth at bedtime. An hour after 81 mg ASA 90 tablet 3  . NONFORMULARY OR COMPOUNDED ITEM [facility]  Peripheral Neuropathy Cream- Bupivacaine 1%, Doxepin 3%, Gabapentin 6%, Pentoxifylline 3%, Topiramate 1% Apply 1-2 grams to affected area 3-4 times daily Qty. 120 gm 3 refills 1 each 2  . ondansetron (ZOFRAN ODT) 4 MG disintegrating tablet Take 1 tablet (4 mg total) by mouth every 8 (eight) hours as needed for nausea or vomiting. 30 tablet 0  . pyridoxine (B6 NATURAL) 100 MG tablet Take 200 mg by mouth  daily.     No current facility-administered medications for this visit.      ALLERGIES: Dilaudid [hydromorphone hcl]; Doxycycline; Hydromorphone; Metronidazole; and Pseudoephedrine hcl  Family History  Problem Relation Age of Onset  . Cancer Mother 72       colon  . Hypertension Mother   . Osteoporosis Mother   . Heart attack Father   . Heart disease Sister   . Alcohol abuse Maternal Grandfather     Social History   Socioeconomic History  . Marital status: Married    Spouse name: Not on file  . Number of children: Not on file  . Years of education: Not on file  . Highest education level: Not on file  Occupational History  . Not on file  Social Needs  . Financial resource strain: Not on file  . Food insecurity:    Worry: Not on file    Inability: Not on file  . Transportation needs:    Medical: Not on file    Non-medical: Not on file  Tobacco Use  . Smoking status: Former Smoker    Packs/day: 0.50    Years: 4.00    Pack years: 2.00    Types: Cigarettes    Last attempt to quit: 12/21/1968    Years since quitting: 49.5  . Smokeless tobacco: Never Used  Substance and Sexual Activity  . Alcohol use: No  . Drug use: No  . Sexual activity: Not Currently    Birth control/protection: None  Lifestyle  . Physical activity:    Days per week: Not on file    Minutes per session: Not on file  . Stress: Not on file  Relationships  . Social connections:    Talks on phone: Not on file    Gets together: Not on file    Attends religious service: Not on file    Active member of club or organization: Not on file    Attends meetings of clubs or organizations: Not on file    Relationship status: Not on file  . Intimate partner violence:    Fear of current or ex partner: Not on file    Emotionally abused: Not on file    Physically abused: Not on file    Forced sexual activity: Not on file  Other Topics Concern  . Not on file  Social History Narrative  . Not on file     Review of Systems  Constitutional: Negative.   HENT: Negative.   Eyes: Negative.   Respiratory: Negative.   Cardiovascular: Negative.   Gastrointestinal: Negative.   Genitourinary: Negative.   Musculoskeletal: Negative.   Skin: Negative.   Neurological: Negative.   Endo/Heme/Allergies:  Negative.   Psychiatric/Behavioral: Negative.     PHYSICAL EXAMINATION:    BP 138/78 (BP Location: Right Arm, Patient Position: Sitting, Cuff Size: Large)   Pulse 68   Resp 18   Wt 238 lb (108 kg)   LMP 12/21/1998 (Approximate)   BMI 37.28 kg/m     General appearance: alert, cooperative and appears stated age Neck: no adenopathy, supple, symmetrical, trachea midline and thyroid normal to inspection and palpation Heart: regular rate and rhythm Lungs: CTAB Abdomen: soft, non-tender; bowel sounds normal; no masses,  no organomegaly Extremities: normal, atraumatic, no cyanosis Skin: normal color, texture and turgor, no rashes or lesions Lymph: normal cervical supraclavicular and inguinal nodes Neurologic: grossly normal    Ultrasound images reviewed with the patient  ASSESSMENT Endometrial polyp PMP female Well controlled diabetes, last HgbA1C was 6.2 (3/19)    PLAN Plan: hysteroscopy, polypectomy, dilation and curettage. Reviewed risks, including: bleeding, infection, uterine perforation, fluid overload, need for further sugery    An After Visit Summary was printed and given to the patient.  Over 15 minutes face to face time of which over 50% was spent in counseling.   CC: Dr Raliegh Scarlet

## 2018-06-15 ENCOUNTER — Encounter: Payer: Self-pay | Admitting: Obstetrics and Gynecology

## 2018-06-16 ENCOUNTER — Ambulatory Visit: Payer: Medicare Other | Admitting: Family Medicine

## 2018-06-17 ENCOUNTER — Telehealth: Payer: Self-pay | Admitting: Obstetrics and Gynecology

## 2018-06-17 NOTE — Telephone Encounter (Signed)
Spoke with patient regarding benefit for surgery. Patient understood and agreeable. Patient has confirmed and is ready to proceed with scheduling. Patient aware this is professional benefit only. Patient aware will be contacted by hospital for separate benefits. Forwarding to Nurse Supervisor for scheduling ° °Routing to Sally Yeakley, RN °

## 2018-06-21 ENCOUNTER — Encounter (HOSPITAL_BASED_OUTPATIENT_CLINIC_OR_DEPARTMENT_OTHER): Payer: Self-pay | Admitting: *Deleted

## 2018-06-21 ENCOUNTER — Encounter: Payer: Self-pay | Admitting: Family Medicine

## 2018-06-21 ENCOUNTER — Ambulatory Visit (INDEPENDENT_AMBULATORY_CARE_PROVIDER_SITE_OTHER): Payer: Medicare Other | Admitting: Family Medicine

## 2018-06-21 VITALS — BP 128/76 | HR 65 | Temp 98.3°F | Ht 67.0 in | Wt 239.0 lb

## 2018-06-21 DIAGNOSIS — J069 Acute upper respiratory infection, unspecified: Secondary | ICD-10-CM

## 2018-06-21 DIAGNOSIS — R05 Cough: Secondary | ICD-10-CM

## 2018-06-21 DIAGNOSIS — R059 Cough, unspecified: Secondary | ICD-10-CM

## 2018-06-21 NOTE — Telephone Encounter (Signed)
Return call to patient. States PCP said she has respiratory infection and recommended she delay surgery by one week.  Surgery date changed to 715-19 at 0730 at [facility]. Patient aware time may be adjusted.  Surgery instruction sheet reviewed and printed copy will be mailed with brochure to surgery center.   Routing to provider for final review. Patient agreeable to disposition. Will close encounter.

## 2018-06-21 NOTE — Progress Notes (Signed)
Acute Care Office visit  Assessment and plan:  1. Viral upper respiratory tract infection   2. Cough     - Viral vs Allergic vs Bacterial causes for pt's symptoms reveiwed.     - Supportive care and various OTC medications discussed in addition to any prescribed, such as Dayquil, Mucinex DM, Tylenol cold/sinus.  Push fluids and continue to rest and stay home as often as possible.  - Encouraged patient to sleep and allow her body to heal.  Do not go out in the heat.  Drink lots of liquids, soups and otherwise.  Continue to monitor for signs of infection.  - Keep an eye on her blood pressure while using cold medications.  - Call or RTC if new symptoms, or if no improvement or worse over next several days.    - Will consider ABX if sx continue past 10 days and worsening if not already given.  In the past, patient has used Z-pack with success.  She does not like using prednisone.  - Patient will try her husband's Tussionex prescription to treat her cough.    No orders of the defined types were placed in this encounter.   No orders of the defined types were placed in this encounter.   Gross side effects, risk and benefits, and alternatives of medications discussed with patient.  Patient is aware that all medications have potential side effects and we are unable to predict every sideeffect or drug-drug interaction that may occur.  Expresses verbal understanding and consents to current therapy plan and treatment regiment.   Education and routine counseling performed. Handouts provided.  Anticipatory guidance and routine counseling done re: condition, txmnt options and need for follow up. All questions of patient's were answered.  Return if symptoms worsen or fail to improve, for keep chronic f/ups.  Please see AVS handed out to patient at the end of our visit for additional patient instructions/ counseling done pertaining to today's office visit.  Note: This document was  partially repared using Dragon voice recognition software and may include unintentional dictation errors.  This document serves as a record of services personally performed by [name], DO. It was created on her behalf by Toni Amend, a trained medical scribe. The creation of this record is based on the scribe's personal observations and the provider's statements to them.   I have reviewed the above medical documentation for accuracy and completeness and I concur.  [name] 06/21/18 5:11 PM    Subjective:    Chief Complaint  Patient presents with  . Cough    HPI:  Pt presents with Sx for 2-3 days (started 2 days ago).   C/o:  A "barky" cough; "when I blow my nose, it's a milky substance there."  Says she did cough up a few things but that she's a "cough upper type of person."  States that she does feel short of breath at times.   Has some chest congestion and "little twinges in my ears."  Has had sore throat and dryness of the mouth.  Denies: Fevers, "but who can tell in this heat?"  Denies wheezing.    For symptoms patient has tried:  Flonase and Mucinex.  Overall getting:  Husband thinks she's doing worse today.  Hopes she will be better by next Monday.  Thinks she got the cold from her husband.   Patient Care Team    Relationship Specialty Notifications Start End  [name], DO PCP - General Family  Medicine  11/24/16   [name], MD Consulting Physician Oncology  11/24/16   Clarene Essex, MD Consulting Physician Gastroenterology  04/20/18   Alda Berthold, DO Consulting Physician Neurology  04/20/18   Edrick Kins, DPM Consulting Physician Podiatry  04/20/18     Past medical history, Surgical history, Family history reviewed and noted below, Social history, Allergies, and Medications have been entered into the medical record, reviewed and changed as needed.   Allergies  Allergen Reactions  . Dilaudid [Hydromorphone Hcl] Other (See  Comments)    anxious  . Doxycycline Other (See Comments)    Pancreatic issues  . Hydromorphone   . Metronidazole Other (See Comments)    Pt doesn't recall  . Pseudoephedrine Hcl     Review of Systems: - see above HPI for pertinent positives General:   No F/C, wt loss Pulm:   No DIB, pleuritic chest pain Card:  No CP, palpitations Abd:  No n/v/d or pain Ext:  No inc edema from baseline   Objective:   Blood pressure 128/76, pulse 65, temperature 98.3 F (36.8 C), height 5\' 7"  (1.702 m), weight 239 lb (108.4 kg), last menstrual period 12/21/1998, SpO2 98 %. Body mass index is 37.43 kg/m. General: Well Developed, well nourished, appropriate for stated age.  Neuro: Alert and oriented x3, extra-ocular muscles intact, sensation grossly intact.  HEENT: Normocephalic, atraumatic, pupils equal round reactive to light, neck supple, no masses, no painful lymphadenopathy, TM's intact B/L, no acute findings. Nares- patent, clear d/c, OP- clear, mild erythema, No TTP sinuses Skin: Warm and dry, no gross rash. Cardiac: RRR, S1 S2,  no murmurs rubs or gallops.  Respiratory: ECTA B/L and A/P, Not using accessory muscles, speaking in full sentences- unlabored. Vascular:  No gross lower ext edema, cap RF less 2 sec. Psych: No HI/SI, judgement and insight good, Euthymic mood. Full Affect.

## 2018-06-21 NOTE — Telephone Encounter (Signed)
Call to patient. Brief discussion of date options. Patient prefers to proceed with first available, 06-27-18. She is in a doctors office and will call when she is finished.   Case request sent for [facility]. Will need anesthesia review to determine if patient is candidate for out-patient facility.

## 2018-06-21 NOTE — Patient Instructions (Signed)
Declined AVS

## 2018-06-21 NOTE — Telephone Encounter (Signed)
Patient is returning a call to Sally. °

## 2018-06-29 NOTE — Progress Notes (Addendum)
Sent dr Talbert Nan message in epic , high priority, to enter pre-op orders. Pt has PAT appointment 06-30-2018 @ 0800.  ADDENDUM:  LVM FOR SALLY, OR SCHEDULER LAST NIGHT 06-30-2018 FOR ORDERS.  AND CALLED DR Renovo # THIS MORNING , SHE HAS FORWARDED TO HER OFFICE,  SPOKE W/ STARLA, OFFICE STAFF, PLEASE LET DR Talbert Nan KNOW ORDERS TO BE ENTERED ASAP.

## 2018-06-30 ENCOUNTER — Encounter (HOSPITAL_COMMUNITY): Payer: Self-pay

## 2018-06-30 ENCOUNTER — Other Ambulatory Visit: Payer: Self-pay

## 2018-06-30 ENCOUNTER — Encounter (HOSPITAL_COMMUNITY)
Admission: RE | Admit: 2018-06-30 | Discharge: 2018-06-30 | Disposition: A | Discharge status: Home / Self Care | Payer: Medicare Other | Source: Ambulatory Visit | Attending: Obstetrics and Gynecology | Admitting: Obstetrics and Gynecology

## 2018-06-30 DIAGNOSIS — Z01812 Encounter for preprocedural laboratory examination: Secondary | ICD-10-CM | POA: Insufficient documentation

## 2018-06-30 HISTORY — DX: Anemia, unspecified: D64.9

## 2018-06-30 HISTORY — DX: Headache, unspecified: R51.9

## 2018-06-30 HISTORY — DX: Polyneuropathy, unspecified: G62.9

## 2018-06-30 HISTORY — DX: Abnormal electrocardiogram (ECG) (EKG): R94.31

## 2018-06-30 HISTORY — DX: Headache: R51

## 2018-06-30 LAB — CBC
HEMATOCRIT: 40 % (ref 36.0–46.0)
HEMOGLOBIN: 13.7 g/dL (ref 12.0–15.0)
MCH: 30.4 pg (ref 26.0–34.0)
MCHC: 34.3 g/dL (ref 30.0–36.0)
MCV: 88.7 fL (ref 78.0–100.0)
Platelets: 159 10*3/uL (ref 150–400)
RBC: 4.51 MIL/uL (ref 3.87–5.11)
RDW: 13.2 % (ref 11.5–15.5)
WBC: 6.7 10*3/uL (ref 4.0–10.5)

## 2018-06-30 LAB — COMPREHENSIVE METABOLIC PANEL
ALT: 32 U/L (ref 0–44)
ANION GAP: 8 (ref 5–15)
AST: 29 U/L (ref 15–41)
Albumin: 4 g/dL (ref 3.5–5.0)
Alkaline Phosphatase: 82 U/L (ref 38–126)
BILIRUBIN TOTAL: 0.9 mg/dL (ref 0.3–1.2)
BUN: 10 mg/dL (ref 8–23)
CO2: 29 mmol/L (ref 22–32)
Calcium: 9.8 mg/dL (ref 8.9–10.3)
Chloride: 104 mmol/L (ref 98–111)
Creatinine, Ser: 0.65 mg/dL (ref 0.44–1.00)
GFR calc Af Amer: >60 mL/min (ref >60)
Glucose, Bld: 137 mg/dL — ABNORMAL HIGH (ref 70–99)
POTASSIUM: 5.2 mmol/L — AB (ref 3.5–5.1)
Sodium: 141 mmol/L (ref 135–145)
Total Protein: 7.7 g/dL (ref 6.5–8.1)

## 2018-06-30 LAB — GLUCOSE, CAPILLARY: Glucose-Capillary: 145 mg/dL — ABNORMAL HIGH (ref 70–99)

## 2018-06-30 NOTE — Progress Notes (Addendum)
Anesthesia consult done with dr Ambrose Pancoast mda,. Can use 04-13-18 ekg for 07-04-18 surgery. The following lab work ordered per dr oddonon mds" cbc and cmet. EKG 04-13-18 ON CHART

## 2018-06-30 NOTE — Patient Instructions (Addendum)
     Jasmine Davila  06/30/2018      Your procedure is scheduled on 07-04-18  Report to [facility].  Call this number if you have problems the morning of surgery:[phone]  OUR ADDRESS IS [address], WE ARE [facility].                            BRING CPAP ENTIRE MACHINE               Remember:  Do not eat food or drink liquids after midnight.  Take these medicines the morning of surgery with A SIP OF WATER: gabapentin (neurontin), losarta (cozaar), flonase nasal spray,              famotidine (pepcid)   Do not wear jewelry, make-up or nail polish.  Do not wear lotions, powders, or perfumes, or deoderant.  Do not shave 48 hours prior to surgery.  Men may shave face and neck.  Do not bring valuables to the hospital.  [facility] is not responsible for any belongings or valuables.  Contacts, dentures or bridgework may not be worn into surgery.  Leave your suitcase in the car.  After surgery it may be brought to your room.  For patients admitted to the hospital, discharge time will be determined by your treatment team.  Patients discharged the day of surgery will not be allowed to drive home. Driver home   Special instructions: Please read over the following fact sheets that you were given:

## 2018-07-01 ENCOUNTER — Telehealth: Payer: Self-pay | Admitting: Obstetrics and Gynecology

## 2018-07-01 NOTE — Telephone Encounter (Signed)
Call to [place] at [facility]. Advised message received and orders will be entered during lunch break.   Routing to Dr Talbert Nan, Encounter closed.

## 2018-07-01 NOTE — H&P (Signed)
GYNECOLOGY  VISIT   HPI: 67 y.o.   Married  Caucasian  female   G2P2001 with Patient's last menstrual period was 12/21/1998 (approximate).  here for follow up Fluid in endometrial cavity, this was an incidental finding on CT scan. No vaginal bleeding, not sexually active.    GYNECOLOGIC HISTORY: Patient's last menstrual period was 12/21/1998 (approximate). Contraception:postmenopause  Menopausal hormone therapy: none  Pap 05/26/18: negative                OB History    Gravida  2   Para  2   Term  2   Preterm      AB      Living  1     SAB      TAB      Ectopic      Multiple      Live Births  2                   Patient Active Problem List   Diagnosis Date Noted   . Endometrial disorder- trace endometrial canal fluid seen on CT scan recently and in 2016.  No GYN care many yrs, to GYN for TVUS and further w/up prn 04/20/2018   . Diabetic peripheral neuropathy associated with type 2 diabetes mellitus ([place]) 04/20/2018   . Acute pancreatitis 04/13/2018   . Onychomycosis 03/16/2018   . Neuropathic pain syndrome (non-herpetic) 03/16/2018   . Sleep apnea with CPAP 12/29/2016   . History of decreased platelet count 12/29/2016   . Morbid obesity ([place]) 11/25/2016   . Diabetes mellitus due to underlying condition with diabetic nephropathy, with long-term current use of insulin ([place]) 11/25/2016   . Smoking hx 11/24/2016   . Hypertension associated with diabetes ([place]) 11/24/2016   . Chronic pancreatitis ([place]) 11/24/2016   . H/o Cancer:   B- cell lymphoma (spleen and lung) - non-Hodgkin's 11/24/2016   . Diabetic neuropathy - exaccerbated by chemo 11/24/2016   . GERD (gastroesophageal reflux disease) 11/24/2016   . Hyperlipidemia associated with type 2 diabetes mellitus ([place]) 11/24/2016   . B12 deficiency 11/24/2016   . Vitamin B6 deficiency (non anemic) 11/24/2016          Past Medical History:  Diagnosis Date   . Acute pancreatitis    . Cancer ([place])      B cell lymphoma- Non Hodgkins in remission   . Diabetic neuropathy ([place])    . Hypertension            Past Surgical History:  Procedure Laterality Date   . CHOLECYSTECTOMY     . MEDIPORT INSERTION, DOUBLE     . MEDIPORT REMOVAL              Current Outpatient Medications  Medication Sig Dispense Refill   . aspirin EC 81 MG tablet Take 81 mg by mouth at bedtime.      . Cholecalciferol (VITAMIN D-3) 5000 units TABS Take 1 tablet by mouth daily.     . cyanocobalamin (,VITAMIN B-12,) 1000 MCG/ML injection Inject 0.5 mLs (500 mcg total) into the muscle every 30 (thirty) days. 10 mL 1   . famotidine (PEPCID) 20 MG tablet Take 1 tablet (20 mg total) by mouth 2 (two) times daily. 250 tablet 1   . folic acid (FOLVITE) 539 MCG tablet Take 400 mcg by mouth daily.     Marland Kitchen gabapentin (NEURONTIN) 300 MG capsule Take 4 capsules (1,200 mg total) by mouth 3 (three) times daily. [place]  capsule 5   . HUMALOG KWIKPEN 100 UNIT/ML KiwkPen INJECT 20 UNITS INTO THE SKIN DAILY WITH LARGEST MEAL 15 mL 1   . LANTUS SOLOSTAR 100 UNIT/ML Solostar Pen INJECT 60 UNITS INTO THE SKIN EVERY MORNING. 30 mL 1   . lipase/protease/amylase (CREON) 12000 units CPEP capsule Take 1 capsule (12,000 Units total) by mouth 3 (three) times daily before meals. 270 capsule 3   . losartan (COZAAR) 25 MG tablet Take 1 tablet (25 mg total) by mouth daily. 90 tablet 1   . Multiple Vitamin (MULTIVITAMIN) tablet Take 1 tablet by mouth daily.     . niacin (NIASPAN) 1000 MG CR tablet Take 1 tablet (1,000 mg total) by mouth at bedtime. An hour after 81 mg ASA 90 tablet 3   . NONFORMULARY OR COMPOUNDED ITEM [facility]  Peripheral Neuropathy Cream- Bupivacaine 1%, Doxepin 3%, Gabapentin 6%, Pentoxifylline 3%, Topiramate 1% Apply 1-2 grams to affected area 3-4 times daily Qty. 120 gm 3 refills 1 each 2   . ondansetron (ZOFRAN ODT) 4 MG disintegrating tablet Take 1 tablet (4 mg total) by mouth every 8 (eight) hours  as needed for nausea or vomiting. 30 tablet 0   . pyridoxine (B6 NATURAL) 100 MG tablet Take 200 mg by mouth daily.      No current facility-administered medications for this visit.      ALLERGIES: Dilaudid [hydromorphone hcl]; Doxycycline; Hydromorphone; Metronidazole; and Pseudoephedrine hcl        Family History  Problem Relation Age of Onset   . Cancer Mother 17        colon   . Hypertension Mother    . Osteoporosis Mother    . Heart attack Father    . Heart disease Sister    . Alcohol abuse Maternal Grandfather      Social History         Socioeconomic History  . Marital status: Married     Spouse name: Not on file   . Number of children: Not on file   . Years of education: Not on file   . Highest education level: Not on file   Occupational History   . Not on file   Social Needs   . Financial resource strain: Not on file   . Food insecurity:     Worry: Not on file     Inability: Not on file   . Transportation needs:     Medical: Not on file     Non-medical: Not on file   Tobacco Use   . Smoking status: Former Smoker     Packs/day: 0.50     Years: 4.00     Pack years: 2.00     Types: Cigarettes     Last attempt to quit: 12/21/1968     Years since quitting: 49.5   . Smokeless tobacco: Never Used   Substance and Sexual Activity   . Alcohol use: No   . Drug use: No   . Sexual activity: Not Currently     Birth control/protection: None   Lifestyle   . Physical activity:     Days per week: Not on file     Minutes per session: Not on file   . Stress: Not on file   Relationships   . Social connections:     Talks on phone: Not on file     Gets together: Not on file     Attends religious service: Not on file     Active  member of club or organization: Not on file     Attends meetings of clubs or organizations: Not on file     Relationship status: Not on file   . Intimate partner violence:     Fear of  current or ex partner: Not on file     Emotionally abused: Not on file     Physically abused: Not on file     Forced sexual activity: Not on file   Other Topics Concern   . Not on file   Social History Narrative   . Not on file     Review of Systems  Constitutional: Negative.   HENT: Negative.   Eyes: Negative.   Respiratory: Negative.   Cardiovascular: Negative.   Gastrointestinal: Negative.   Genitourinary: Negative.   Musculoskeletal: Negative.   Skin: Negative.   Neurological: Negative.   Endo/Heme/Allergies: Negative.   Psychiatric/Behavioral: Negative.     PHYSICAL EXAMINATION:    BP 138/78 (BP Location: Right Arm, Patient Position: Sitting, Cuff Size: Large)   Pulse 68   Resp 18   Wt 238 lb (108 kg)   LMP 12/21/1998 (Approximate)   BMI 37.28 kg/m     General appearance: alert, cooperative and appears stated age Neck: no adenopathy, supple, symmetrical, trachea midline and thyroid normal to inspection and palpation Heart: regular rate and rhythm Lungs: CTAB Abdomen: soft, non-tender; bowel sounds normal; no masses,  no organomegaly Extremities: normal, atraumatic, no cyanosis Skin: normal color, texture and turgor, no rashes or lesions Lymph: normal cervical supraclavicular and inguinal nodes Neurologic: grossly normal    Ultrasound images reviewed with the patient  ASSESSMENT Endometrial polyp PMP female Well controlled diabetes, last HgbA1C was 6.2 (3/19)    PLAN Plan: hysteroscopy, polypectomy, dilation and curettage. Reviewed risks, including: bleeding, infection, uterine perforation, fluid overload, need for further sugery

## 2018-07-01 NOTE — Telephone Encounter (Signed)
Langley Gauss from the [facility] called requesting orders be entered for this patient's surgery on Monday, 07/04/18.

## 2018-07-04 ENCOUNTER — Ambulatory Visit (HOSPITAL_BASED_OUTPATIENT_CLINIC_OR_DEPARTMENT_OTHER): Payer: Medicare Other | Admitting: Anesthesiology

## 2018-07-04 ENCOUNTER — Encounter (HOSPITAL_BASED_OUTPATIENT_CLINIC_OR_DEPARTMENT_OTHER): Payer: Self-pay | Admitting: Emergency Medicine

## 2018-07-04 ENCOUNTER — Ambulatory Visit (HOSPITAL_BASED_OUTPATIENT_CLINIC_OR_DEPARTMENT_OTHER)
Admission: RE | Admit: 2018-07-04 | Discharge: 2018-07-04 | Disposition: A | Discharge status: Home / Self Care | Payer: Medicare Other | Source: Ambulatory Visit | Attending: Obstetrics and Gynecology | Admitting: Obstetrics and Gynecology

## 2018-07-04 ENCOUNTER — Encounter (HOSPITAL_BASED_OUTPATIENT_CLINIC_OR_DEPARTMENT_OTHER)
Admission: RE | Disposition: A | Discharge status: Home / Self Care | Payer: Self-pay | Source: Ambulatory Visit | Attending: Obstetrics and Gynecology

## 2018-07-04 DIAGNOSIS — N859 Noninflammatory disorder of uterus, unspecified: Secondary | ICD-10-CM | POA: Diagnosis not present

## 2018-07-04 DIAGNOSIS — K219 Gastro-esophageal reflux disease without esophagitis: Secondary | ICD-10-CM | POA: Insufficient documentation

## 2018-07-04 DIAGNOSIS — Z794 Long term (current) use of insulin: Secondary | ICD-10-CM | POA: Insufficient documentation

## 2018-07-04 DIAGNOSIS — Z7982 Long term (current) use of aspirin: Secondary | ICD-10-CM | POA: Diagnosis not present

## 2018-07-04 DIAGNOSIS — E531 Pyridoxine deficiency: Secondary | ICD-10-CM | POA: Diagnosis not present

## 2018-07-04 DIAGNOSIS — E1169 Type 2 diabetes mellitus with other specified complication: Secondary | ICD-10-CM | POA: Diagnosis not present

## 2018-07-04 DIAGNOSIS — G4730 Sleep apnea, unspecified: Secondary | ICD-10-CM | POA: Insufficient documentation

## 2018-07-04 DIAGNOSIS — Z87891 Personal history of nicotine dependence: Secondary | ICD-10-CM | POA: Insufficient documentation

## 2018-07-04 DIAGNOSIS — E538 Deficiency of other specified B group vitamins: Secondary | ICD-10-CM | POA: Insufficient documentation

## 2018-07-04 DIAGNOSIS — Z8572 Personal history of non-Hodgkin lymphomas: Secondary | ICD-10-CM | POA: Insufficient documentation

## 2018-07-04 DIAGNOSIS — N840 Polyp of corpus uteri: Secondary | ICD-10-CM | POA: Diagnosis not present

## 2018-07-04 DIAGNOSIS — Z79899 Other long term (current) drug therapy: Secondary | ICD-10-CM | POA: Diagnosis not present

## 2018-07-04 DIAGNOSIS — E785 Hyperlipidemia, unspecified: Secondary | ICD-10-CM | POA: Diagnosis not present

## 2018-07-04 DIAGNOSIS — E1142 Type 2 diabetes mellitus with diabetic polyneuropathy: Secondary | ICD-10-CM | POA: Insufficient documentation

## 2018-07-04 DIAGNOSIS — N858 Other specified noninflammatory disorders of uterus: Secondary | ICD-10-CM | POA: Diagnosis not present

## 2018-07-04 DIAGNOSIS — I10 Essential (primary) hypertension: Secondary | ICD-10-CM | POA: Diagnosis not present

## 2018-07-04 HISTORY — DX: Deficiency of other specified B group vitamins: E53.8

## 2018-07-04 HISTORY — DX: Type 2 diabetes mellitus without complications: E11.9

## 2018-07-04 HISTORY — DX: Sleep apnea, unspecified: G47.30

## 2018-07-04 HISTORY — PX: DILATATION & CURETTAGE/HYSTEROSCOPY WITH MYOSURE: SHX6511

## 2018-07-04 HISTORY — DX: Noninflammatory disorder of uterus, unspecified: N85.9

## 2018-07-04 HISTORY — DX: Non-Hodgkin lymphoma, unspecified, unspecified site: C85.90

## 2018-07-04 HISTORY — DX: Unspecified condition associated with female genital organs and menstrual cycle: N94.9

## 2018-07-04 HISTORY — DX: Morbid (severe) obesity due to excess calories: E66.01

## 2018-07-04 HISTORY — DX: Gastro-esophageal reflux disease without esophagitis: K21.9

## 2018-07-04 LAB — GLUCOSE, CAPILLARY
Glucose-Capillary: 130 mg/dL — ABNORMAL HIGH (ref 70–99)
Glucose-Capillary: 134 mg/dL — ABNORMAL HIGH (ref 70–99)

## 2018-07-04 SURGERY — DILATATION & CURETTAGE/HYSTEROSCOPY WITH MYOSURE
Anesthesia: General

## 2018-07-04 MED ORDER — ONDANSETRON HCL 4 MG/2ML IJ SOLN
INTRAMUSCULAR | Status: DC | PRN
  Administered 2018-07-04: 4 mg via INTRAVENOUS

## 2018-07-04 MED ORDER — FENTANYL CITRATE (PF) 100 MCG/2ML IJ SOLN
INTRAMUSCULAR | Status: AC
  Filled 2018-07-04: qty 2

## 2018-07-04 MED ORDER — ONDANSETRON HCL 4 MG/2ML IJ SOLN
4.0000 mg | Freq: Four times a day (QID) | INTRAMUSCULAR | Status: DC | PRN
  Filled 2018-07-04: qty 2

## 2018-07-04 MED ORDER — PROPOFOL 10 MG/ML IV BOLUS
INTRAVENOUS | Status: DC | PRN
  Administered 2018-07-04: 150 mg via INTRAVENOUS

## 2018-07-04 MED ORDER — PROPOFOL 10 MG/ML IV BOLUS
INTRAVENOUS | Status: AC
  Filled 2018-07-04: qty 40

## 2018-07-04 MED ORDER — DEXAMETHASONE SODIUM PHOSPHATE 10 MG/ML IJ SOLN
INTRAMUSCULAR | Status: AC
  Filled 2018-07-04: qty 1

## 2018-07-04 MED ORDER — FENTANYL CITRATE (PF) 100 MCG/2ML IJ SOLN
INTRAMUSCULAR | Status: DC | PRN
  Administered 2018-07-04: 25 ug via INTRAVENOUS

## 2018-07-04 MED ORDER — FENTANYL CITRATE (PF) 100 MCG/2ML IJ SOLN
25.0000 ug | INTRAMUSCULAR | Status: DC | PRN
  Filled 2018-07-04: qty 1

## 2018-07-04 MED ORDER — LACTATED RINGERS IV SOLN
INTRAVENOUS | Status: DC
  Administered 2018-07-04: 06:00:00 via INTRAVENOUS
  Filled 2018-07-04: qty 1000

## 2018-07-04 MED ORDER — OXYCODONE HCL 5 MG PO TABS
5.0000 mg | ORAL_TABLET | Freq: Once | ORAL | Status: DC | PRN
  Filled 2018-07-04: qty 1

## 2018-07-04 MED ORDER — OXYCODONE HCL 5 MG/5ML PO SOLN
5.0000 mg | Freq: Once | ORAL | Status: DC | PRN
  Filled 2018-07-04: qty 5

## 2018-07-04 MED ORDER — DEXAMETHASONE SODIUM PHOSPHATE 10 MG/ML IJ SOLN
INTRAMUSCULAR | Status: DC | PRN
  Administered 2018-07-04: 10 mg via INTRAVENOUS

## 2018-07-04 MED ORDER — MIDAZOLAM HCL 2 MG/2ML IJ SOLN
INTRAMUSCULAR | Status: AC
  Filled 2018-07-04: qty 2

## 2018-07-04 MED ORDER — MIDAZOLAM HCL 2 MG/2ML IJ SOLN
INTRAMUSCULAR | Status: DC | PRN
  Administered 2018-07-04: 1 mg via INTRAVENOUS

## 2018-07-04 SURGICAL SUPPLY — 21 items
CANISTER SUCT 3000ML PPV (MISCELLANEOUS) ×4 IMPLANT
CATH ROBINSON RED A/P 16FR (CATHETERS) IMPLANT
DEVICE MYOSURE LITE (MISCELLANEOUS) ×2 IMPLANT
DEVICE MYOSURE REACH (MISCELLANEOUS) IMPLANT
DILATOR CANAL MILEX (MISCELLANEOUS) IMPLANT
FILTER ARTHROSCOPY CONVERTOR (FILTER) ×2 IMPLANT
GLOVE BIO SURGEON STRL SZ 6.5 (GLOVE) ×2 IMPLANT
GOWN STRL REUS W/TWL LRG LVL3 (GOWN DISPOSABLE) ×2 IMPLANT
IV NS IRRIG 3000ML ARTHROMATIC (IV SOLUTION) ×2 IMPLANT
KIT TURNOVER CYSTO (KITS) ×2 IMPLANT
MYOSURE XL FIBROID REM (MISCELLANEOUS)
PACK VAGINAL MINOR WOMEN LF (CUSTOM PROCEDURE TRAY) ×2 IMPLANT
PAD OB MATERNITY 4.3X12.25 (PERSONAL CARE ITEMS) ×2 IMPLANT
PAD PREP 24X48 CUFFED NSTRL (MISCELLANEOUS) ×2 IMPLANT
SEAL ROD LENS SCOPE MYOSURE (ABLATOR) ×2 IMPLANT
SYR 20CC LL (SYRINGE) IMPLANT
SYSTEM TISS REMOVAL MYSR XL RM (MISCELLANEOUS) IMPLANT
TOWEL OR 17X24 6PK STRL BLUE (TOWEL DISPOSABLE) ×4 IMPLANT
TUBING AQUILEX INFLOW (TUBING) ×2 IMPLANT
TUBING AQUILEX OUTFLOW (TUBING) ×2 IMPLANT
WATER STERILE IRR 500ML POUR (IV SOLUTION) IMPLANT

## 2018-07-04 NOTE — Interval H&P Note (Signed)
History and Physical Interval Note:  07/04/2018 7:13 AM  Jasmine Davila  has presented today for surgery, with the diagnosis of fluid in endometrial cavity, possible endometrial polyp  The various methods of treatment have been discussed with the patient and family. After consideration of risks, benefits and other options for treatment, the patient has consented to  Procedure(s): [place] (N/A) as a surgical intervention .  The patient's history has been reviewed, patient examined, no change in status, stable for surgery.  I have reviewed the patient's chart and labs.  Questions were answered to the patient's satisfaction.     Salvadore Dom

## 2018-07-04 NOTE — Transfer of Care (Signed)
Immediate Anesthesia Transfer of Care Note  Patient: Jasmine Davila  Procedure(s) Performed: DILATATION & CURETTAGE/HYSTEROSCOPY WITH MYOSURE (N/A )  Patient Location: PACU  Anesthesia Type:General  Level of Consciousness: awake, alert , oriented and patient cooperative  Airway & Oxygen Therapy: Patient Spontanous Breathing and Patient connected to nasal cannula oxygen  Post-op Assessment: Report given to RN and Post -op Vital signs reviewed and stable  Post vital signs: Reviewed and stable  Last Vitals:  Vitals Value Taken Time  BP    Temp    Pulse    Resp    SpO2      Last Pain:  Vitals:   07/04/18 0543  TempSrc: Oral         Complications: No apparent anesthesia complications

## 2018-07-04 NOTE — Op Note (Signed)
Preoperative Diagnosis: Endometrial polyp  Postoperative Diagnosis: Same  Procedure: Hysteroscopy, polypectomy, dilation and curettage  Surgeon: Dr Sumner Boast  Assistants: None  Anesthesia: General via LMA  EBL: minimal  Fluids: 400 cc  Fluid deficit: 250 cc  Urine output: not measured  Indications for surgery: The patient is a 67 yo female, who was incidentally noted to have fluid in her endometrial cavity on CT of her ABD/Pelvis. An ultrasound was performed which showed endometrial fluid and an intracavitary mass suspicious for an endometrial polyp.  The risks of the surgery were reviewed with the patient and the consent form was signed prior to her surgery.  Findings: EUA: small anteverted uterus, no adnexal masses. Hysteroscopy: 2 endometrial polyps, otherwise atrophic appearing endometrium. Normal tubal ostia bilaterally.   Specimens: Endometrial polyp, endometrial curettings   Procedure: The patient was taken to the operating room with an IV in place. She was placed in the dorsal lithotomy position and anesthesia was administered. She was prepped and draped in the usual sterile fashion for a vaginal procedure. She voided on the way to the OR. A weighted speculum was placed in the vagina and a single tooth tenaculum was placed on the anterior lip of the cervix. The cervix was dilated to a #7 hagar dilator. The uterus was sounded to 6 cm. The myosure hysteroscope was inserted into the uterine cavity. With continuous infusion of normal saline, the uterine cavity was visualized with the above findings. The myosure light was used to resect the polyps. The myosure was then removed. The cavity was then curetted with the small sharp curette. The cavity had the characteristically gritty texture at the end of the procedure. The curette and the single tooth tenaculum were removed. The patients perineum was cleansed of betadine and she was taken out of the dorsal lithotomy position.  Upon  awakening the LMA was removed and the patient was transferred to the recovery room in stable and awake condition.  The sponge and instrument count were correct. There were no complications.   CC: Dr Raliegh Scarlet

## 2018-07-04 NOTE — Anesthesia Postprocedure Evaluation (Signed)
Anesthesia Post Note  Patient: Jasmine Davila  Procedure(s) Performed: [place] (N/A )     Patient location during evaluation: PACU Anesthesia Type: General Level of consciousness: awake and alert Pain management: pain level controlled Vital Signs Assessment: post-procedure vital signs reviewed and stable Respiratory status: spontaneous breathing, nonlabored ventilation, respiratory function stable and patient connected to nasal cannula oxygen Cardiovascular status: blood pressure returned to baseline and stable Postop Assessment: no apparent nausea or vomiting Anesthetic complications: no    Last Vitals:  Vitals:   07/04/18 0830 07/04/18 0901  BP: 139/61 (!) 148/64  Pulse: 66 60  Resp: 13 18  Temp:  36.7 C  SpO2: 100% 100%    Last Pain:  Vitals:   07/04/18 0901  TempSrc: Oral  PainSc:                  [place] S

## 2018-07-04 NOTE — Anesthesia Preprocedure Evaluation (Addendum)
Anesthesia Evaluation  Patient identified by MRN, date of birth, ID band Patient awake    Reviewed: Allergy & Precautions, H&P , NPO status , Patient's Chart, lab work & pertinent test results  Airway Mallampati: II  TM Distance: >3 FB Neck ROM: full    Dental  (+) Missing, Dental Advisory Given,    Pulmonary sleep apnea , former smoker,    breath sounds clear to auscultation       Cardiovascular hypertension,  Rhythm:regular Rate:Normal     Neuro/Psych  Headaches,  Neuromuscular disease    GI/Hepatic GERD  ,  Endo/Other  diabetes, Type 2, Insulin Dependentobese  Renal/GU      Musculoskeletal   Abdominal   Peds  Hematology lymphoma   Anesthesia Other Findings Too many teeth missing to list  Reproductive/Obstetrics                            Anesthesia Physical Anesthesia Plan  ASA: III  Anesthesia Plan: General   Post-op Pain Management:    Induction: Intravenous  PONV Risk Score and Plan: 3 and Ondansetron, Midazolam, Scopolamine patch - Pre-op and Treatment may vary due to age or medical condition  Airway Management Planned: LMA  Additional Equipment:   Intra-op Plan:   Post-operative Plan:   Informed Consent: I have reviewed the patients History and Physical, chart, labs and discussed the procedure including the risks, benefits and alternatives for the proposed anesthesia with the patient or authorized representative who has indicated his/her understanding and acceptance.     Plan Discussed with: CRNA, Anesthesiologist and Surgeon  Anesthesia Plan Comments:         Anesthesia Quick Evaluation

## 2018-07-04 NOTE — Discharge Instructions (Signed)
° °  D & C Home care Instructions:   Personal hygiene:  Used sanitary napkins for vaginal drainage not tampons. Shower or tub bathe the day after your procedure. No douching until bleeding stops. Always wipe from front to back after  Elimination.  Activity: Do not drive or operate any equipment today. The effects of the anesthesia are still present and drowsiness may result. Rest today, not necessarily flat bed rest, just take it easy. You may resume your normal activity in one to 2 days.  Sexual activity: No intercourse for one week or as indicated by your physician  Diet: Eat a light diet as desired this evening. You may resume a regular diet tomorrow.  Return to work: One to 2 days.  General Expectations of your surgery: Vaginal bleeding should be no heavier than a normal period. Spotting may continue up to 10 days. Mild cramps may continue for a couple of days. You may have a regular period in 2-6 weeks.  Unexpected observations call your doctor if these occur: persistent or heavy bleeding. Severe abdominal cramping or pain. Elevation of temperature greater than 100F.  Call for an appointment in one week.   Post Anesthesia Home Care Instructions  Activity: Get plenty of rest for the remainder of the day. A responsible individual must stay with you for 24 hours following the procedure.  For the next 24 hours, DO NOT: -Drive a car -Paediatric nurse -Drink alcoholic beverages -Take any medication unless instructed by your physician -Make any legal decisions or sign important papers.  Meals: Start with liquid foods such as gelatin or soup. Progress to regular foods as tolerated. Avoid greasy, spicy, heavy foods. If nausea and/or vomiting occur, drink only clear liquids until the nausea and/or vomiting subsides. Call your physician if vomiting continues.  Special Instructions/Symptoms: Your throat may feel dry or sore from the anesthesia or the breathing tube placed in your throat  during surgery. If this causes discomfort, gargle with warm salt water. The discomfort should disappear within 24 hours.  If you had a scopolamine patch placed behind your ear for the management of post- operative nausea and/or vomiting:  1. The medication in the patch is effective for 72 hours, after which it should be removed.  Wrap patch in a tissue and discard in the trash. Wash hands thoroughly with soap and water. 2. You may remove the patch earlier than 72 hours if you experience unpleasant side effects which may include dry mouth, dizziness or visual disturbances. 3. Avoid touching the patch. Wash your hands with soap and water after contact with the patch.

## 2018-07-04 NOTE — Anesthesia Procedure Notes (Signed)
Procedure Name: LMA Insertion Date/Time: 07/04/2018 7:23 AM Performed by: Wanita Chamberlain, CRNA Pre-anesthesia Checklist: Patient identified, Timeout performed, Emergency Drugs available, Suction available and Patient being monitored Patient Re-evaluated:Patient Re-evaluated prior to induction Oxygen Delivery Method: Circle system utilized Preoxygenation: Pre-oxygenation with 100% oxygen Induction Type: IV induction Ventilation: Mask ventilation without difficulty Laryngoscope Size: Mac and 4 Number of attempts: 1

## 2018-07-06 ENCOUNTER — Encounter (HOSPITAL_BASED_OUTPATIENT_CLINIC_OR_DEPARTMENT_OTHER): Payer: Self-pay | Admitting: Obstetrics and Gynecology

## 2018-07-13 ENCOUNTER — Other Ambulatory Visit: Payer: Self-pay | Admitting: Family Medicine

## 2018-07-13 NOTE — Progress Notes (Signed)
GYNECOLOGY  VISIT   HPI: 67 y.o.   Married  Caucasian  female   G2P2001 with Patient's last menstrual period was 12/21/1998 (approximate).   here for 2 week post op D&C, polypectomy. Pathology was benign.  She had mild cramping and spotting for 8 days post op. Now feels fine.   GYNECOLOGIC HISTORY: Patient's last menstrual period was 12/21/1998 (approximate). Contraception: Postmenopausal Menopausal hormone therapy: None        OB History    Gravida  2   Para  2   Term  2   Preterm      AB      Living  1     SAB      TAB      Ectopic      Multiple      Live Births  2              Patient Active Problem List   Diagnosis Date Noted  . History of malignant lymphoma 06/03/2018  . Neuropathy 06/03/2018  . Endometrial disorder- trace endometrial canal fluid seen on CT scan recently and in 2016.  No GYN care many yrs, to GYN for TVUS and further w/up prn 04/20/2018  . Diabetic peripheral neuropathy associated with type 2 diabetes mellitus ([place]) 04/20/2018  . Acute pancreatitis 04/13/2018  . Onychomycosis 03/16/2018  . Neuropathic pain syndrome (non-herpetic) 03/16/2018  . Sleep apnea with CPAP 12/29/2016  . History of decreased platelet count 12/29/2016  . Morbid obesity ([place]) 11/25/2016  . Diabetes mellitus due to underlying condition with diabetic nephropathy, with long-term current use of insulin ([place]) 11/25/2016  . Smoking hx 11/24/2016  . Hypertension associated with diabetes ([place]) 11/24/2016  . Chronic pancreatitis ([place]) 11/24/2016  . 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 Diabetic neuropathy - exaccerbated by chemo 11/24/2016  . GERD (gastroesophageal reflux disease) 11/24/2016  . Hyperlipidemia associated with type 2 diabetes mellitus ([place]) 11/24/2016  . B12 deficiency 11/24/2016  . Vitamin B6 deficiency (non anemic) 11/24/2016    Past Medical History:  Diagnosis Date  . Abnormal EKG    hx of right bundle  branch block  . Acute pancreatitis    HX OF  . Anemia yrs ago  . Cancer ([place]) dx 2016   B cell lymphoma- Non Hodgkins in remission  . Diabetes mellitus ([place])    TYPE 2  . Diabetic neuropathy ([place])   . Diabetic neuropathy ([place])   . Endometrial disorder   . GERD (gastroesophageal reflux disease)   . Headache    tension or sinus  . Hypertension   . Malignant lymphoma ([place])   . Morbid obesity ([place])   . Peripheral neuropathy    BOTH FEET, SLIGHT NEUROPATHY IN HANDS  . Sleep apnea    USES CPAP at times  . Vitamin B 12 deficiency     Past Surgical History:  Procedure Laterality Date  . CHOLECYSTECTOMY  03/06/1996  . colomscopy    . DILATATION & CURETTAGE/HYSTEROSCOPY WITH MYOSURE N/A 07/04/2018   Procedure: DILATATION & CURETTAGE/HYSTEROSCOPY WITH MYOSURE;  Surgeon: Salvadore Dom, MD;  Location[facility];  Service: Gynecology;  Laterality: N/A;  . MEDIPORT INSERTION, DOUBLE    . MEDIPORT REMOVAL      Current Outpatient Medications  Medication Sig Dispense Refill  . aspirin EC 81 MG tablet Take 81 mg by mouth at bedtime.     . Cholecalciferol (VITAMIN D-3) 5000 units TABS Take 1  tablet by mouth daily.    . cyanocobalamin (,VITAMIN B-12,) 1000 MCG/ML injection Inject 0.5 mLs (500 mcg total) into the muscle every 30 (thirty) days. 10 mL 1  . famotidine (PEPCID) 20 MG tablet Take 1 tablet (20 mg total) by mouth 2 (two) times daily. 180 tablet 1  . fluticasone (FLONASE) 50 MCG/ACT nasal spray Place 1 spray into both nostrils daily as needed for allergies or rhinitis.    . folic acid (FOLVITE) 696 MCG tablet Take 400 mcg by mouth daily.    Marland Kitchen gabapentin (NEURONTIN) 300 MG capsule Take 4 capsules (1,200 mg total) by mouth 3 (three) times daily. 360 capsule 5  . HUMALOG KWIKPEN 100 UNIT/ML KiwkPen INJECT 20 UNITS INTO THE SKIN DAILY WITH LARGEST MEAL 15 mL 1  . LANTUS SOLOSTAR 100 UNIT/ML Solostar Pen INJECT 60 UNITS INTO THE SKIN EVERY MORNING. 30 mL 1  .  lipase/protease/amylase (CREON) 12000 units CPEP capsule Take 1 capsule (12,000 Units total) by mouth 3 (three) times daily before meals. 270 capsule 3  . losartan (COZAAR) 25 MG tablet Take 1 tablet (25 mg total) by mouth daily. 90 tablet 1  . Multiple Vitamin (MULTIVITAMIN) tablet Take 1 tablet by mouth daily.    . niacin (NIASPAN) 1000 MG CR tablet Take 1 tablet (1,000 mg total) by mouth at bedtime. An hour after 81 mg ASA 90 tablet 3  . NONFORMULARY OR COMPOUNDED ITEM [facility]  Peripheral Neuropathy Cream- Bupivacaine 1%, Doxepin 3%, Gabapentin 6%, Pentoxifylline 3%, Topiramate 1% Apply 1-2 grams to affected area 3-4 times daily Qty. 120 gm 3 refills 1 each 2  . pyridoxine (B6 NATURAL) 100 MG tablet Take 200 mg by mouth daily.     No current facility-administered medications for this visit.      ALLERGIES: Dilaudid [hydromorphone hcl]; Doxycycline; Metronidazole; and Pseudoephedrine hcl  Family History  Problem Relation Age of Onset  . Cancer Mother 31       colon  . Hypertension Mother   . Osteoporosis Mother   . Heart attack Father   . Heart disease Sister   . Alcohol abuse Maternal Grandfather     Social History   Socioeconomic History  . Marital status: Married    Spouse name: Not on file  . Number of children: Not on file  . Years of education: Not on file  . Highest education level: Not on file  Occupational History  . Not on file  Social Needs  . Financial resource strain: Not on file  . Food insecurity:    Worry: Not on file    Inability: Not on file  . Transportation needs:    Medical: Not on file    Non-medical: Not on file  Tobacco Use  . Smoking status: Former Smoker    Packs/day: 0.50    Years: 4.00    Pack years: 2.00    Types: Cigarettes    Last attempt to quit: 12/21/1968    Years since quitting: 49.5  . Smokeless tobacco: Never Used  . Tobacco comment: as teenager  Substance and Sexual Activity  . Alcohol use: No  . Drug use: No   . Sexual activity: Not Currently    Birth control/protection: None  Lifestyle  . Physical activity:    Days per week: Not on file    Minutes per session: Not on file  . Stress: Not on file  Relationships  . Social connections:    Talks on phone: Not on file  Gets together: Not on file    Attends religious service: Not on file    Active member of club or organization: Not on file    Attends meetings of clubs or organizations: Not on file    Relationship status: Not on file  . Intimate partner violence:    Fear of current or ex partner: Not on file    Emotionally abused: Not on file    Physically abused: Not on file    Forced sexual activity: Not on file  Other Topics Concern  . Not on file  Social History Narrative  . Not on file    Review of Systems  Constitutional: Negative.   HENT: Negative.   Eyes: Negative.   Respiratory: Negative.   Cardiovascular: Negative.   Gastrointestinal: Negative.   Genitourinary: Negative.   Musculoskeletal: Negative.   Skin: Negative.   Neurological: Negative.   Endo/Heme/Allergies: Negative.   Psychiatric/Behavioral: Negative.     PHYSICAL EXAMINATION:    BP 126/64 (BP Location: Right Arm, Patient Position: Sitting, Cuff Size: Large)   Pulse 84   Resp 14   Ht 5\' 7"  (1.702 m)   Wt 233 lb 6.4 oz (105.9 kg)   LMP 12/21/1998 (Approximate)   BMI 36.56 kg/m     General appearance: alert, cooperative and appears stated age Abdomen: soft, non-tender; non distended, no masses,  no organomegaly   ASSESSMENT S/P hysteroscopy, polypectomy, D&C, pathology benign. Doing well    PLAN Routine f/u with primary MD   An After Visit Summary was printed and given to the patient.   CC: Dr Raliegh Scarlet

## 2018-07-14 ENCOUNTER — Ambulatory Visit (INDEPENDENT_AMBULATORY_CARE_PROVIDER_SITE_OTHER): Payer: Medicare Other | Admitting: Obstetrics and Gynecology

## 2018-07-14 ENCOUNTER — Other Ambulatory Visit: Payer: Self-pay

## 2018-07-14 ENCOUNTER — Encounter: Payer: Self-pay | Admitting: Obstetrics and Gynecology

## 2018-07-14 VITALS — BP 126/64 | HR 84 | Resp 14 | Ht 67.0 in | Wt 233.4 lb

## 2018-07-14 DIAGNOSIS — Z9889 Other specified postprocedural states: Secondary | ICD-10-CM | POA: Diagnosis not present

## 2018-07-14 DIAGNOSIS — N840 Polyp of corpus uteri: Secondary | ICD-10-CM

## 2018-07-14 DIAGNOSIS — Z0001 Encounter for general adult medical examination with abnormal findings: Secondary | ICD-10-CM | POA: Diagnosis not present

## 2018-07-14 DIAGNOSIS — Z0000 Encounter for general adult medical examination without abnormal findings: Secondary | ICD-10-CM | POA: Diagnosis not present

## 2018-07-18 ENCOUNTER — Other Ambulatory Visit: Payer: Self-pay | Admitting: Family Medicine

## 2018-07-27 ENCOUNTER — Ambulatory Visit (INDEPENDENT_AMBULATORY_CARE_PROVIDER_SITE_OTHER): Payer: Medicare Other | Admitting: Neurology

## 2018-07-27 ENCOUNTER — Encounter: Payer: Self-pay | Admitting: Neurology

## 2018-07-27 VITALS — BP 120/68 | HR 86 | Ht 67.0 in | Wt 232.0 lb

## 2018-07-27 DIAGNOSIS — E1142 Type 2 diabetes mellitus with diabetic polyneuropathy: Secondary | ICD-10-CM | POA: Diagnosis not present

## 2018-07-27 MED ORDER — NORTRIPTYLINE HCL 10 MG PO CAPS: ORAL_CAPSULE | ORAL | 5 refills | Status: DC

## 2018-07-27 NOTE — Patient Instructions (Addendum)
Start nortriptyline 10mg  at bedtime for 2 week, then increase to 2 tablet at bedtime.  If you develop dry eyes or dry mouth, stay at the lower dose.  Continue gabapentin 1200mg  three times daily  Call with an update in 4-6 months  Return to clinic in 4 months

## 2018-07-27 NOTE — Progress Notes (Signed)
[facility] Clinic Note - Initial Visit   Date: 07/27/18  Jasmine Davila MRN: [id number] DOB: 1951-04-28   Dear Dr. Raliegh Scarlet:  Thank you for your kind referral of Jasmine Davila for consultation of neuropathy. Although her history is well known to you, please allow [place] to reiterate it for the purpose of our medical record. The patient was accompanied to the clinic by self.    History of Present Illness: Jasmine Davila is a 67 y.o. right-handed female with hypertension, well-controlled diabetes mellitus (QZR0Q 6.2) complicated by nephropathy and neuropathy, history of B-cell lymphoma, and hyperlipidemia presenting for evaluation of painful neuropathy.  She moved from [place] in 2016 to be closer to her son who lives in [place].   Starting around 2012, she began having numbness and tingling in the soles of the feet which was more of a nuisance than painful.  In 2016, she had chemotherapy in 2016 for B-cell Lymphoma and felt that symptoms intensified and started to involve the lower legs, fingers, and the burning pain was worse.  She complains of difficulty opening jars and bottles. No weakness of the feet. She had bilateral CTS release in 2008.  She does not complain of imbalance and walks unassisted.  She did not tolerate Cymbalta or amitriptyline due to cognitive side effects.   She has not tried Lyrica.  She has been on gabapentin 1200mg  three times daily, previously taking 1600mg  three times daily.  She also has a compound cream which helps a little. She has some relief with this dose, but continues to have burning pain.  She has more stabbing and stinging pain in the evening.  She also has numbness and feels as if she is walking on towels.  Because of her pain, she has not been able to stay as active.  Out-side paper records, electronic medical record, and images have been reviewed where available and summarized as:  Lab Results    Component Value Date   TSH 1.670 12/08/2017   Lab Results  Component Value Date   HGBA1C 6.2 (H) 03/09/2018   Lab Results  Component Value Date   VITAMINB12 719 03/09/2018   Lab Results  Component Value Date   FOLATE >20.0 03/09/2018    Past Medical History:  Diagnosis Date  . Abnormal EKG    hx of right bundle branch block  . Acute pancreatitis    HX OF  . Anemia yrs ago  . Cancer ([place]) dx 2016   B cell lymphoma- Non Hodgkins in remission  . Diabetes mellitus ([place])    TYPE 2  . Diabetic neuropathy ([place])   . Diabetic neuropathy ([place])   . Endometrial disorder   . GERD (gastroesophageal reflux disease)   . Headache    tension or sinus  . Hypertension   . Malignant lymphoma ([place])   . Morbid obesity ([place])   . Peripheral neuropathy    BOTH FEET, SLIGHT NEUROPATHY IN HANDS  . Sleep apnea    USES CPAP at times  . Vitamin B 12 deficiency     Past Surgical History:  Procedure Laterality Date  . CHOLECYSTECTOMY  03/06/1996  . colomscopy    . DILATATION & CURETTAGE/HYSTEROSCOPY WITH MYOSURE N/A 07/04/2018   Procedure: DILATATION & CURETTAGE/HYSTEROSCOPY WITH MYOSURE;  Surgeon: Salvadore Dom, MD;  Location: [facility];  Service: Gynecology;  Laterality: N/A;  . MEDIPORT INSERTION, DOUBLE    . MEDIPORT REMOVAL       Medications:  Outpatient Encounter Medications as of 07/27/2018  Medication Sig  . aspirin EC 81 MG tablet Take 81 mg by mouth at bedtime.   . Cholecalciferol (VITAMIN D-3) 5000 units TABS Take 1 tablet by mouth daily.  . cyanocobalamin (,VITAMIN B-12,) 1000 MCG/ML injection Inject 0.5 mLs (500 mcg total) into the muscle every 30 (thirty) days.  . famotidine (PEPCID) 20 MG tablet Take 1 tablet (20 mg total) by mouth 2 (two) times daily.  . fluticasone (FLONASE) 50 MCG/ACT nasal spray Place 1 spray into both nostrils daily as needed for allergies or rhinitis.  . folic acid (FOLVITE) 119 MCG tablet Take 400 mcg by mouth daily.  Marland Kitchen  gabapentin (NEURONTIN) 300 MG capsule Take 4 capsules (1,200 mg total) by mouth 3 (three) times daily.  Marland Kitchen HUMALOG KWIKPEN 100 UNIT/ML KiwkPen INJECT 20 UNITS INTO THE SKIN DAILY WITH LARGEST MEAL  . LANTUS SOLOSTAR 100 UNIT/ML Solostar Pen INJECT 60 UNITS INTO THE SKIN EVERY MORNING.  Marland Kitchen lipase/protease/amylase (CREON) 12000 units CPEP capsule Take 1 capsule (12,000 Units total) by mouth 3 (three) times daily before meals.  Marland Kitchen losartan (COZAAR) 25 MG tablet TAKE 1 TABLET BY MOUTH DAILY.  . Multiple Vitamin (MULTIVITAMIN) tablet Take 1 tablet by mouth daily.  . niacin (NIASPAN) 1000 MG CR tablet Take 1 tablet (1,000 mg total) by mouth at bedtime. An hour after 81 mg ASA  . NONFORMULARY OR COMPOUNDED ITEM [facility]  Peripheral Neuropathy Cream- Bupivacaine 1%, Doxepin 3%, Gabapentin 6%, Pentoxifylline 3%, Topiramate 1% Apply 1-2 grams to affected area 3-4 times daily Qty. 120 gm 3 refills  . pyridoxine (B6 NATURAL) 100 MG tablet Take 200 mg by mouth daily.  . nortriptyline (PAMELOR) 10 MG capsule Start nortriptyline 10mg  at bedtime for 2 week, then increase to 2 tablet at bedtime   No facility-administered encounter medications on file as of 07/27/2018.      Allergies:  Allergies  Allergen Reactions  . Dilaudid [Hydromorphone Hcl] Other (See Comments)    anxious  . Doxycycline Other (See Comments)    Pancreatic issues  . Metronidazole Other (See Comments)    Cream  Burned face  . Pseudoephedrine Hcl Other (See Comments)    Pt unsure    Family History: Family History  Problem Relation Age of Onset  . Cancer Mother 56       colon  . Hypertension Mother   . Osteoporosis Mother   . Heart attack Father   . Heart disease Sister   . Alcohol abuse Maternal Grandfather     Social History: Social History   Tobacco Use  . Smoking status: Former Smoker    Packs/day: 0.50    Years: 4.00    Pack years: 2.00    Types: Cigarettes    Last attempt to quit: 12/21/1968    Years  since quitting: 49.6  . Smokeless tobacco: Never Used  . Tobacco comment: as teenager  Substance Use Topics  . Alcohol use: No  . Drug use: No   Social History   Social History Narrative   Lives with husband in a one story home.  Has 3 living children.  One child died in a MVA.  Retired from [employer].     Education: high school.    Review of Systems:  CONSTITUTIONAL: No fevers, chills, night sweats, or weight loss.   EYES: No visual changes or eye pain ENT: No hearing changes.  No history of nose bleeds.   RESPIRATORY: No cough, wheezing and shortness of  breath.   CARDIOVASCULAR: Negative for chest pain, and palpitations.   GI: Negative for abdominal discomfort, blood in stools or black stools.  No recent change in bowel habits.   GU:  No history of incontinence.   MUSCLOSKELETAL: No history of joint pain or swelling.  No myalgias.   SKIN: Negative for lesions, rash, and itching.   HEMATOLOGY/ONCOLOGY: Negative for prolonged bleeding, bruising easily, and swollen nodes.  + history of cancer.   ENDOCRINE: Negative for cold or heat intolerance, polydipsia or goiter.   PSYCH:  No depression or anxiety symptoms.   NEURO: As Above.   Vital Signs:  BP 120/68   Pulse 86   Ht 5\' 7"  (1.702 m)   Wt 232 lb (105.2 kg)   LMP 12/21/1998 (Approximate)   SpO2 96%   BMI 36.34 kg/m    General Medical Exam:   General:  Well appearing, comfortable.   Eyes/ENT: see cranial nerve examination.   Neck: No masses appreciated.  Full range of motion without tenderness.  No carotid bruits. Respiratory:  Clear to auscultation, good air entry bilaterally.   Cardiac:  Regular rate and rhythm, no murmur.   Extremities:  No deformities, edema, or skin discoloration.  Skin:  No rashes or lesions.  Neurological Exam: MENTAL STATUS including orientation to time, place, person, recent and remote memory, attention span and concentration, language, and fund of knowledge is normal.  Speech is not  dysarthric.  CRANIAL NERVES: II:  No visual field defects.  Unremarkable fundi.   III-IV-VI: Pupils equal round and reactive to light.  Normal conjugate, extra-ocular eye movements in all directions of gaze.  No nystagmus.  Mild right ptosis.   V:  Normal facial sensation.    VII:  Normal facial symmetry and movements.   VIII:  Normal hearing and vestibular function.   IX-X:  Normal palatal movement.   XI:  Normal shoulder shrug and head rotation.   XII:  Normal tongue strength and range of motion, no deviation or fasciculation.  MOTOR:  No atrophy, fasciculations or abnormal movements.  No pronator drift.  Tone is normal.    Right Upper Extremity:    Left Upper Extremity:    Deltoid  5/5   Deltoid  5/5   Biceps  5/5   Biceps  5/5   Triceps  5/5   Triceps  5/5   Wrist extensors  5/5   Wrist extensors  5/5   Wrist flexors  5/5   Wrist flexors  5/5   Finger extensors  5/5   Finger extensors  5/5   Finger flexors  5/5   Finger flexors  5/5   Dorsal interossei  5/5   Dorsal interossei  5/5   Abductor pollicis  5/5   Abductor pollicis  5/5   Tone (Ashworth scale)  0  Tone (Ashworth scale)  0   Right Lower Extremity:    Left Lower Extremity:    Hip flexors  5/5   Hip flexors  5/5   Hip extensors  5/5   Hip extensors  5/5   Knee flexors  5/5   Knee flexors  5/5   Knee extensors  5/5   Knee extensors  5/5   Dorsiflexors  5/5   Dorsiflexors  5/5   Plantarflexors  5/5   Plantarflexors  5/5   Toe extensors  5-/5   Toe extensors  5-/5   Toe flexors  5-/5   Toe flexors  5-/5   Tone (Ashworth scale)  0  Tone (Ashworth scale)  0   MSRs:  Right                                                                 Left brachioradialis 2+  brachioradialis 2+  biceps 2+  biceps 2+  triceps 2+  triceps 2+  patellar 2+  patellar 2+  ankle jerk 0  ankle jerk 0  Hoffman no  Hoffman no  plantar response down  plantar response down   SENSORY:  Vibration is absent distal to ankles and reduced at the  MCP bilaterally.  Pin prick and temperature is intact in the hands, reduced in a gradient pattern distal to mid-calf bilaterally.  Romberg's sign absent.   COORDINATION/GAIT: Normal finger-to- nose-finger and heel-to-shin.  Intact rapid alternating movements bilaterally. Gait narrow based and stable. Mild unsteadiness with tandem and stressed gait, but able to perform.    IMPRESSION: Distal and symmetric painful neuropathy, likely due to chemotherapy and to a lesser degree diabetes. Her neurological examination shows a distal predominant small and large fiber peripheral neuropathy. I had extensive discussion with the patient regarding the pathogenesis, etiology, management, and natural course of neuropathy. Neuropathy tends to be slowly progressive.  I would like to test for treatable causes of neuropathy.  TSH, vitamin B12, and folate is normal.  Her diabetes is under excellent control.  The goal of management is to optimize pain relief and minimize risk of falls.   PLAN/RECOMMENDATIONS:  Check vitamin B1, vitamin B6, copper, SPEP with IFE NCS/EMG of right arm and leg to assess for length dependent neuropathy vs overlapping nerve entrapment  Start nortriptyline 10mg  at bedtime for 2 week, then increase to 2 tablet at bedtime Continue gabapentin 1200mg  three times daily Consider pregabalin going forward now that it is generic and more affordable option, but she will need to be tapered off gabapentin slowly due to similar side effect profile. She is compliant with daily foot exam Fall precautions discussed.    Return to clinic in 4 months.   Thank you for allowing me to participate in patient's care.  If I can answer any additional questions, I would be pleased to do so.    Sincerely,    Donika K. Posey Pronto, DO

## 2018-08-30 ENCOUNTER — Other Ambulatory Visit (INDEPENDENT_AMBULATORY_CARE_PROVIDER_SITE_OTHER): Payer: Medicare Other

## 2018-08-30 ENCOUNTER — Other Ambulatory Visit: Payer: Self-pay | Admitting: *Deleted

## 2018-08-30 ENCOUNTER — Encounter: Payer: Self-pay | Admitting: Oncology

## 2018-08-30 ENCOUNTER — Ambulatory Visit (INDEPENDENT_AMBULATORY_CARE_PROVIDER_SITE_OTHER): Payer: Medicare Other | Admitting: Neurology

## 2018-08-30 DIAGNOSIS — M5417 Radiculopathy, lumbosacral region: Secondary | ICD-10-CM

## 2018-08-30 DIAGNOSIS — E1142 Type 2 diabetes mellitus with diabetic polyneuropathy: Secondary | ICD-10-CM

## 2018-08-30 DIAGNOSIS — G610 Guillain-Barre syndrome: Secondary | ICD-10-CM | POA: Diagnosis not present

## 2018-08-30 DIAGNOSIS — E531 Pyridoxine deficiency: Secondary | ICD-10-CM | POA: Diagnosis not present

## 2018-08-30 NOTE — Procedures (Signed)
[facility]  [address]  [place], [place] [address] Tel: [phone] Fax:  [phone] Test Date:  08/30/2018  Patient: Jasmine Davila DOB: Nov 06, 1951 Physician: Narda Amber, DO  Sex: Female Height: 5\' 7"  Ref Phys: Narda Amber, DO  ID#: [id number] Temp: Temp: 33.6C    Patient Complaints: This is a 67 year old female with history of diabetes and exposure to chemotherapy referred for evaluation of generalized paresthesias of the hands and feet.  NCV & EMG Findings: Extensive electrodiagnostic testing of the right upper and lower extremity shows:  1. Right median and radial sensory responses show reduced amplitude. Right ulnar sensory response shows prolonged latency and normal amplitude. 2. Right sural and superficial peroneal sensory responses are absent. 3. Right median and ulnar motor responses are within normal limits. 4. Right peroneal and tibial motor responses are absent. 5. Right tibial H reflex study shows prolonged latency. 6. In the right upper extremity, chronic motor axon loss changes are seen affecting the first dorsal interosseous, extensor indicis proprius, and abductor pollicis brevis muscles, without accompanied active denervation.  7. In the right lower extremity, chronic motor axon loss changes are seen affecting the rectus femoris, flexor digitorum longus, and medial gastrocnemius, and tibialis anterior muscles, with active denervation seen below the knee. 8. There is no active denervation involving the cervical or lumbar paraspinal muscles.  Impression: The electrophysiologic findings are most consistent with a length-dependent, active on chronic predominately axonal sensorimotor polyneuropathy affecting the right upper and lower extremities.  A superimposed right L4 radiculopathy cannot be excluded.   ___________________________ Narda Amber, DO    Nerve Conduction Studies Anti Sensory Summary Table   Site NR Peak (ms) Norm  Peak (ms) P-T Amp (V) Norm P-T Amp  Right Median Anti Sensory (2nd Digit)  Wrist    3.7 <3.8 8.2 >10  Right Radial Anti Sensory (Base 1st Digit)  33.6C  Wrist    2.2 <2.8 9.7 >10  Right Sup Peroneal Anti Sensory (Ant Lat Mall)  33.6C  12 cm NR  <4.6  >3  Right Sural Anti Sensory (Lat Mall)  33.6C  Calf NR  <4.6  >3  Right Ulnar Anti Sensory (5th Digit)  33.6C  Wrist    4.5 <3.2 6.2 >5   Motor Summary Table   Site NR Onset (ms) Norm Onset (ms) O-P Amp (mV) Norm O-P Amp Site1 Site2 Delta-0 (ms) Dist (cm) Vel (m/s) Norm Vel (m/s)  Right Median Motor (Abd Poll Brev)  33.6C  Wrist    3.4 <4.0 5.5 >5 Elbow Wrist 5.8 30.0 52 >50  Elbow    9.2  5.5         Right Peroneal Motor (Ext Dig Brev)  33.6C  Ankle NR  <6.0  >2.5 B Fib Ankle  0.0  >40  B Fib NR     Poplt B Fib  0.0  >40  Poplt NR            Right Tibial Motor (Abd Hall Brev)  33.6C  Ankle NR  <6.0  >4 Knee Ankle  0.0  >40  Knee NR            Right Ulnar Motor (Abd Dig Minimi)  33.6C  Wrist    2.6 <3.1 7.5 >7 B Elbow Wrist 4.0 24.5 61 >50  B Elbow    6.6  6.5  A Elbow B Elbow 1.9 10.0 53 >50  A Elbow    8.5  6.6  H Reflex Studies   NR H-Lat (ms) Lat Norm (ms) L-R H-Lat (ms)  Right Tibial (Gastroc)  33.6C     40.00 <35    EMG   Side Muscle Ins Act Fibs Psw Fasc Number Recrt Dur Dur. Amp Amp. Poly Poly. Comment  Right AntTibialis Nml 1+ Nml Nml 1- Rapid Some 1+ Some 1+ Some 1+ N/A  Right Gastroc Nml 1+ Nml Nml 2- Rapid Some 1+ Some 1+ Nml Nml N/A  Right Flex Dig Long Nml 1+ Nml Nml 1- Rapid Some 1+ Some 1+ Nml Nml N/A  Right RectFemoris Nml Nml Nml Nml 1- Rapid Some 1+ Some 1+ Nml Nml N/A  Right GluteusMed Nml Nml Nml Nml Nml Nml Nml Nml Nml Nml Nml Nml N/A  Right BicepsFemS Nml Nml Nml Nml Nml Nml Nml Nml Nml Nml Nml Nml N/A  Right Lumbo Parasp Low Nml Nml Nml Nml Nml Nml Nml Nml Nml Nml Nml Nml N/A  Right 1stDorInt Nml Nml Nml Nml 1- Rapid Some 1+ Some 1+ Some 1+ N/A  Right Ext Indicis Nml Nml Nml Nml 1-  Rapid Some 1+ Some 1+ Some 1+ N/A  Right PronatorTeres Nml Nml Nml Nml Nml Nml Nml Nml Nml Nml Nml Nml N/A  Right Biceps Nml Nml Nml Nml Nml Nml Nml Nml Nml Nml Nml Nml N/A  Right Triceps Nml Nml Nml Nml Nml Nml Nml Nml Nml Nml Nml Nml N/A  Right Deltoid Nml Nml Nml Nml Nml Nml Nml Nml Nml Nml Nml Nml N/A  Right Cervical Parasp Low Nml Nml Nml Nml Nml Nml Nml Nml Nml Nml Nml Nml N/A  Right Abd Poll Brev Nml Nml Nml Nml 1- Rapid Few 1+ Few 1+ Nml Nml N/A      Waveforms:

## 2018-08-30 NOTE — Progress Notes (Signed)
Follow-up Visit   Date: 08/30/18    Jasmine Davila MRN: [id number] DOB: 10-08-1951   Interim History: Jasmine Davila is a 67 y.o.  right-handed female with hypertension, well-controlled diabetes mellitus (CHY8F 6.2) complicated by nephropathy and neuropathy, history of B-cell lymphoma, and hyperlipidemia returning to the clinic for follow-up of painful neuropathy and electrodiagnostic testing.    History of present illness: Starting around 2012, she began having numbness and tingling in the soles of the feet which was more of a nuisance than painful.  In 2016, she had chemotherapy in 2016 for B-cell Lymphoma and felt that symptoms intensified and started to involve the lower legs, fingers, and the burning pain was worse.  She complains of difficulty opening jars and bottles. No weakness of the feet. She had bilateral CTS release in 2008.  She does not complain of imbalance and walks unassisted.  She did not tolerate Cymbalta or amitriptyline due to cognitive side effects.   She has not tried Lyrica.  She has been on gabapentin 1293m three times daily, previously taking 16031mthree times daily.  She also has a compound cream which helps a little. She has some relief with this dose, but continues to have burning pain.  She has more stabbing and stinging pain in the evening.  She also has numbness and feels as if she is walking on towels.  Because of her pain, she has not been able to stay as active.  UPDATE 08/30/2018:  She is here for ongoing paresthesias are hands and feet.she continues to take gabapentin 1200 mg 3 times a day, which is the maximum dose. Additionally, should start her on nortriptyline 20 mg at bedtime which she is tolerating.  Medications:  Current Outpatient Medications on File Prior to Visit  Medication Sig Dispense Refill  . aspirin EC 81 MG tablet Take 81 mg by mouth at bedtime.     . Cholecalciferol (VITAMIN D-3) 5000 units TABS Take 1 tablet by  mouth daily.    . cyanocobalamin (,VITAMIN B-12,) 1000 MCG/ML injection Inject 0.5 mLs (500 mcg total) into the muscle every 30 (thirty) days. 10 mL 1  . famotidine (PEPCID) 20 MG tablet Take 1 tablet (20 mg total) by mouth 2 (two) times daily. 180 tablet 1  . fluticasone (FLONASE) 50 MCG/ACT nasal spray Place 1 spray into both nostrils daily as needed for allergies or rhinitis.    . folic acid (FOLVITE) 40027CG tablet Take 400 mcg by mouth daily.    . Marland Kitchenabapentin (NEURONTIN) 300 MG capsule Take 4 capsules (1,200 mg total) by mouth 3 (three) times daily. 360 capsule 5  . HUMALOG KWIKPEN 100 UNIT/ML KiwkPen INJECT 20 UNITS INTO THE SKIN DAILY WITH LARGEST MEAL 15 mL 1  . LANTUS SOLOSTAR 100 UNIT/ML Solostar Pen INJECT 60 UNITS INTO THE SKIN EVERY MORNING. 30 mL 1  . lipase/protease/amylase (CREON) 12000 units CPEP capsule Take 1 capsule (12,000 Units total) by mouth 3 (three) times daily before meals. 270 capsule 3  . losartan (COZAAR) 25 MG tablet TAKE 1 TABLET BY MOUTH DAILY. 90 tablet 1  . Multiple Vitamin (MULTIVITAMIN) tablet Take 1 tablet by mouth daily.    . niacin (NIASPAN) 1000 MG CR tablet Take 1 tablet (1,000 mg total) by mouth at bedtime. An hour after 81 mg ASA 90 tablet 3  . NONFORMULARY OR COMPOUNDED ITEM [facility]  Peripheral Neuropathy Cream- Bupivacaine 1%, Doxepin 3%, Gabapentin 6%, Pentoxifylline 3%, Topiramate 1% Apply 1-2 grams to affected  area 3-4 times daily Qty. 120 gm 3 refills 1 each 2  . nortriptyline (PAMELOR) 10 MG capsule Start nortriptyline 71m at bedtime for 2 week, then increase to 2 tablet at bedtime 60 capsule 5  . pyridoxine (B6 NATURAL) 100 MG tablet Take 200 mg by mouth daily.     No current facility-administered medications on file prior to visit.     Allergies:  Allergies  Allergen Reactions  . Dilaudid [Hydromorphone Hcl] Other (See Comments)    anxious  . Doxycycline Other (See Comments)    Pancreatic issues  . Metronidazole Other (See  Comments)    Cream  Burned face  . Pseudoephedrine Hcl Other (See Comments)    Pt unsure    Review of Systems:  CONSTITUTIONAL: No fevers, chills, night sweats, or weight loss.  EYES: No visual changes or eye pain ENT: No hearing changes.  No history of nose bleeds.   RESPIRATORY: No cough, wheezing and shortness of breath.   CARDIOVASCULAR: Negative for chest pain, and palpitations.   GI: Negative for abdominal discomfort, blood in stools or black stools.  No recent change in bowel habits.   GU:  No history of incontinence.   MUSCLOSKELETAL: No history of joint pain or swelling.  No myalgias.   SKIN: Negative for lesions, rash, and itching.   ENDOCRINE: Negative for cold or heat intolerance, polydipsia or goiter.   PSYCH:  No depression or anxiety symptoms.   NEURO: As Above.   Neurological Exam: Deferred  Data: NCS/EMG of the right arm and leg 08/30/2018: The electrophysiologic findings are most consistent with a length-dependent, active on chronic predominately axonal sensorimotor polyneuropathy affecting the right upper and lower extremities.  A superimposed right L4 radiculopathy cannot be excluded.  IMPRESSION/PLAN: Length-dependent sensorimotor polyneuropathy affecting the hands and feet, confirmed by electrodiagnostic testing today. There was evidence of activefibrillation potentials in muscles below the knee, most likely due to ongoing neuropathy. There was no evidence of fibrillations in the paraspinal muscles as well as absence conduction block or temporal dispersion, making polyradiculoneuropathy less likely. I discussed further evaluation with laboratory testing for causes of neuropathy and we'll check ESR, CRP,copper, vitamin B1, vitamin B 6, and SPEP with IFE. Continue nortriptyline 20 mg at bedtime and gabapentin 1200 mg 3 times daily.  Possible superimposed L4 radiculopathy as seen on EMG. Check MRI lumbar spine with and without contrast to evaluate for structural  pathology as well as well as nerve root enhancement which may suggest a polyradiculoneuropathy.   The duration of this appointment visit was 20 minutes of face-to-face time with the patient.  Greater than 50% of this time was spent in counseling, explanation of diagnosis, planning of further management, and coordination of care.   Thank you for allowing me to participate in patient's care.  If I can answer any additional questions, I would be pleased to do so.    Sincerely,    Avanti Jetter K. PPosey Pronto DO

## 2018-08-31 DIAGNOSIS — R590 Localized enlarged lymph nodes: Secondary | ICD-10-CM | POA: Diagnosis not present

## 2018-08-31 DIAGNOSIS — K449 Diaphragmatic hernia without obstruction or gangrene: Secondary | ICD-10-CM | POA: Diagnosis not present

## 2018-08-31 DIAGNOSIS — I700 Atherosclerosis of aorta: Secondary | ICD-10-CM | POA: Diagnosis not present

## 2018-08-31 DIAGNOSIS — C8330 Diffuse large B-cell lymphoma, unspecified site: Secondary | ICD-10-CM | POA: Diagnosis not present

## 2018-08-31 DIAGNOSIS — Z8572 Personal history of non-Hodgkin lymphomas: Secondary | ICD-10-CM | POA: Diagnosis not present

## 2018-08-31 LAB — CBC AND DIFFERENTIAL
HEMATOCRIT: 39 (ref 36–46)
Hemoglobin: 13 (ref 12.0–16.0)
PLATELETS: 148 — AB (ref 150–399)
WBC: 4.9

## 2018-08-31 LAB — BASIC METABOLIC PANEL
BUN: 14 (ref 4–21)
CREATININE: 0.7 (ref 0.5–1.1)
Glucose: 142
Potassium: 4.8 (ref 3.4–5.3)
Sodium: 137 (ref 137–147)

## 2018-08-31 LAB — HEPATIC FUNCTION PANEL
Alkaline Phosphatase: 81 (ref 25–125)
BILIRUBIN, TOTAL: 0.8

## 2018-09-02 ENCOUNTER — Other Ambulatory Visit: Payer: Self-pay | Admitting: Family Medicine

## 2018-09-02 DIAGNOSIS — C801 Malignant (primary) neoplasm, unspecified: Secondary | ICD-10-CM

## 2018-09-02 DIAGNOSIS — E785 Hyperlipidemia, unspecified: Secondary | ICD-10-CM

## 2018-09-02 DIAGNOSIS — Z9221 Personal history of antineoplastic chemotherapy: Secondary | ICD-10-CM | POA: Diagnosis not present

## 2018-09-02 DIAGNOSIS — Z8572 Personal history of non-Hodgkin lymphomas: Secondary | ICD-10-CM | POA: Diagnosis not present

## 2018-09-02 DIAGNOSIS — R590 Localized enlarged lymph nodes: Secondary | ICD-10-CM | POA: Diagnosis not present

## 2018-09-02 DIAGNOSIS — I10 Essential (primary) hypertension: Principal | ICD-10-CM

## 2018-09-02 DIAGNOSIS — E0821 Diabetes mellitus due to underlying condition with diabetic nephropathy: Secondary | ICD-10-CM

## 2018-09-02 DIAGNOSIS — E1142 Type 2 diabetes mellitus with diabetic polyneuropathy: Secondary | ICD-10-CM

## 2018-09-02 DIAGNOSIS — E1169 Type 2 diabetes mellitus with other specified complication: Secondary | ICD-10-CM

## 2018-09-02 DIAGNOSIS — C8518 Unspecified B-cell lymphoma, lymph nodes of multiple sites: Secondary | ICD-10-CM | POA: Diagnosis not present

## 2018-09-02 DIAGNOSIS — I152 Hypertension secondary to endocrine disorders: Secondary | ICD-10-CM

## 2018-09-02 DIAGNOSIS — Z794 Long term (current) use of insulin: Secondary | ICD-10-CM

## 2018-09-02 DIAGNOSIS — E1159 Type 2 diabetes mellitus with other circulatory complications: Secondary | ICD-10-CM

## 2018-09-05 LAB — COPPER, SERUM: Copper: 138 ug/dL (ref 70–175)

## 2018-09-05 LAB — IMMUNOFIXATION ELECTROPHORESIS
IGG (IMMUNOGLOBIN G), SERUM: 1578 mg/dL — AB (ref 600–1540)
IGM, SERUM: 123 mg/dL (ref 50–300)
IMMUNOGLOBULIN A: 262 mg/dL (ref 20–320)

## 2018-09-05 LAB — PROTEIN ELECTROPHORESIS, SERUM
Albumin ELP: 4.1 g/dL (ref 3.8–4.8)
Alpha 1: 0.3 g/dL (ref 0.2–0.3)
Alpha 2: 0.8 g/dL (ref 0.5–0.9)
BETA 2: 0.4 g/dL (ref 0.2–0.5)
BETA GLOBULIN: 0.5 g/dL (ref 0.4–0.6)
GAMMA GLOBULIN: 1.5 g/dL (ref 0.8–1.7)
Total Protein: 7.6 g/dL (ref 6.1–8.1)

## 2018-09-05 LAB — VITAMIN B1: Vitamin B1 (Thiamine): 13 nmol/L (ref 8–30)

## 2018-09-05 LAB — C-REACTIVE PROTEIN: CRP: 14.1 mg/L — AB (ref <8.0)

## 2018-09-05 LAB — SEDIMENTATION RATE: SED RATE: 43 mm/h — AB (ref 0–30)

## 2018-09-05 LAB — VITAMIN B6: VITAMIN B6: 116 ng/mL — AB (ref 2.1–21.7)

## 2018-09-07 ENCOUNTER — Telehealth: Payer: Self-pay | Admitting: *Deleted

## 2018-09-07 ENCOUNTER — Other Ambulatory Visit: Payer: Self-pay | Admitting: *Deleted

## 2018-09-07 DIAGNOSIS — G610 Guillain-Barre syndrome: Secondary | ICD-10-CM

## 2018-09-07 DIAGNOSIS — M5417 Radiculopathy, lumbosacral region: Secondary | ICD-10-CM

## 2018-09-07 DIAGNOSIS — E1142 Type 2 diabetes mellitus with diabetic polyneuropathy: Secondary | ICD-10-CM

## 2018-09-07 NOTE — Telephone Encounter (Signed)
-----   Message from Alda Berthold, DO sent at 09/06/2018  4:14 PM EDT ----- Please inform patient that her inflammatory markers are elevated and vitamin B6 is high.  If she is taking B6 supplements, please have her stop this.  Let's check RF, SSA/B, ANA, ANCA to look for autoimmune causes for her elevated inflammatory markers.  Thanks.

## 2018-09-07 NOTE — Telephone Encounter (Signed)
Patient given lab results and instructions.  She will come in next week for labs.

## 2018-09-13 ENCOUNTER — Telehealth: Payer: Self-pay | Admitting: Neurology

## 2018-09-13 NOTE — Telephone Encounter (Signed)
Patient called regarding her needing bloodwork taken for Dr. Posey Pronto and at her MRI. She is wondering can she have it taken only one time at her MRI? Please Call. Thanks

## 2018-09-14 NOTE — Telephone Encounter (Signed)
Patient lmom needing to speak with you. Please Call. Thanks °

## 2018-09-14 NOTE — Telephone Encounter (Signed)
Patient notified that she will need to come to our lab.

## 2018-09-15 ENCOUNTER — Ambulatory Visit
Admission: RE | Admit: 2018-09-15 | Discharge: 2018-09-15 | Disposition: A | Discharge status: Home / Self Care | Payer: Medicare Other | Source: Ambulatory Visit | Attending: Neurology | Admitting: Neurology

## 2018-09-15 ENCOUNTER — Other Ambulatory Visit (INDEPENDENT_AMBULATORY_CARE_PROVIDER_SITE_OTHER): Payer: Medicare Other

## 2018-09-15 DIAGNOSIS — M5417 Radiculopathy, lumbosacral region: Secondary | ICD-10-CM | POA: Diagnosis not present

## 2018-09-15 DIAGNOSIS — E1142 Type 2 diabetes mellitus with diabetic polyneuropathy: Secondary | ICD-10-CM

## 2018-09-15 DIAGNOSIS — G610 Guillain-Barre syndrome: Secondary | ICD-10-CM | POA: Diagnosis not present

## 2018-09-15 DIAGNOSIS — M4726 Other spondylosis with radiculopathy, lumbar region: Secondary | ICD-10-CM | POA: Diagnosis not present

## 2018-09-15 DIAGNOSIS — M48061 Spinal stenosis, lumbar region without neurogenic claudication: Secondary | ICD-10-CM | POA: Diagnosis not present

## 2018-09-15 MED ORDER — GADOBENATE DIMEGLUMINE 529 MG/ML IV SOLN
20.0000 mL | Freq: Once | INTRAVENOUS | Status: AC | PRN
  Administered 2018-09-15: 20 mL via INTRAVENOUS

## 2018-09-16 ENCOUNTER — Telehealth: Payer: Self-pay | Admitting: *Deleted

## 2018-09-16 ENCOUNTER — Telehealth: Payer: Self-pay | Admitting: Neurology

## 2018-09-16 LAB — ANA: Anti Nuclear Antibody(ANA): NEGATIVE

## 2018-09-16 LAB — RHEUMATOID FACTOR: Rhuematoid fact SerPl-aCnc: 173 IU/mL — ABNORMAL HIGH (ref <14)

## 2018-09-16 LAB — SJOGREN'S SYNDROME ANTIBODS(SSA + SSB)
SSA (Ro) (ENA) Antibody, IgG: <1 AI
SSB (La) (ENA) Antibody, IgG: <1 AI

## 2018-09-16 LAB — ANCA SCREEN W REFLEX TITER: ANCA Screen: NEGATIVE

## 2018-09-16 NOTE — Telephone Encounter (Signed)
Called and discussed results of MRI lumbar spine and labs.  Imaging shows degenerative changes at L5-S1 moderate with foraminal stenosis, no nerve root enhancement.  These findings are too limited to explain the diffuse nature of her neuropathic pain and lack of nerve root enhancement argues against polyradiculoneuropathy.    NCS/EMG shows predominately severe predominately axonal  polyneuropathy affecting arms and legs, with active changes below the knee.  Because of this, she underwent laboratory testing looking for various causes of neuropathy.  She has elevated inflammatory markers and very high RF (see below).  Other autoimmune labs including ANA, SSA/B, and ANCA are in process.  I will initiate referral to rheumatology for further evaluation of autoimmune disease.    DATA: MRI lumbar spine 09/15/2018:  L5-S1 degenerative left foraminal impingement and moderate right foraminal narrowing. The canal is diffusely patent.  Labs 08/30/2018:  CRP 14.1*, ESR 43*, SPEP with faint IgG lambda monoclonal protein, copper 138, vitamin B6 116*, vitamin B1 13 Labs 09/16/2018:  RF 173**, ANA SSA/B ANCA - pending   Donika K. Posey Pronto, DO

## 2018-09-16 NOTE — Telephone Encounter (Signed)
I will send referral next week.

## 2018-09-16 NOTE — Telephone Encounter (Signed)
-----  Message from Alda Berthold, DO sent at 09/16/2018 10:38 AM EDT ----- Refer to rheumatology for further evaluation of autoimmune disease given elevated RF, CRP, and ESR.  Please fax results along with referral once all labs are back. Thanks.

## 2018-09-19 ENCOUNTER — Encounter: Payer: Self-pay | Admitting: Family Medicine

## 2018-09-19 ENCOUNTER — Ambulatory Visit (INDEPENDENT_AMBULATORY_CARE_PROVIDER_SITE_OTHER): Payer: Medicare Other | Admitting: Family Medicine

## 2018-09-19 DIAGNOSIS — M792 Neuralgia and neuritis, unspecified: Secondary | ICD-10-CM

## 2018-09-19 DIAGNOSIS — E1159 Type 2 diabetes mellitus with other circulatory complications: Secondary | ICD-10-CM

## 2018-09-19 DIAGNOSIS — E1142 Type 2 diabetes mellitus with diabetic polyneuropathy: Secondary | ICD-10-CM

## 2018-09-19 DIAGNOSIS — I152 Hypertension secondary to endocrine disorders: Secondary | ICD-10-CM

## 2018-09-19 DIAGNOSIS — I10 Essential (primary) hypertension: Secondary | ICD-10-CM | POA: Diagnosis not present

## 2018-09-19 DIAGNOSIS — Z794 Long term (current) use of insulin: Secondary | ICD-10-CM | POA: Diagnosis not present

## 2018-09-19 DIAGNOSIS — E0821 Diabetes mellitus due to underlying condition with diabetic nephropathy: Secondary | ICD-10-CM | POA: Diagnosis not present

## 2018-09-19 DIAGNOSIS — C801 Malignant (primary) neoplasm, unspecified: Secondary | ICD-10-CM

## 2018-09-19 LAB — POCT GLYCOSYLATED HEMOGLOBIN (HGB A1C): HEMOGLOBIN A1C: 6.4 % — AB (ref 4.0–5.6)

## 2018-09-19 NOTE — Progress Notes (Signed)
Impression and Recommendations:    1. Diabetes mellitus due to underlying condition with diabetic nephropathy, with long-term current use of insulin ([place])   2. Diabetic peripheral neuropathy associated with type 2 diabetes mellitus ([place])   3. Neuropathic pain syndrome (non-herpetic)   4. H/o Cancer:   B- cell lymphoma (spleen and lung) - non-Hodgkin's   5. Hypertension associated with diabetes ([place])     DM -Educated pt to take her medications after large meals -Encouraged pt to increase exercise levels in order to help her body better manage blood glucose -Discussed goal levels of A1C for pt  -Encouraged pt to work towards better dietary choices in order to work towards better A1C levels -Discussed need for yearly ser/creatinine checks in order to monitor kidney health  HTN -Bp at goal at home; continue meds as directed -Discussed goals for bp   Neuropathy -Encouraged pt to increase her exercise levels to help with blood flow and circulation to her feet -Discussed methods to help alleviate symptoms including elevating feet at night and light activity -Encouraged pt to take her nortriptyline during the day to help with symptoms -Discussed sedating effects of pamilar and encouraged pt to see how medication effects her before taking it in the morning -Recommended other medications to discuss with her neurologist if she feels her symptoms are not being managed  Obesity -Encouraged to  Increase exercise to AHA guidelines -Discussed how additional weight negatively impacts joint pain  -Discussed how weight loss can help with managing diabetes and HTN -Discussed weight loss medications and how they can augment lifestyle changes but diet and exercise are required -Discussed different classes of weight loss medications and some negative SE  Health Management  -Encouraged pt to ensure diagnostic labs or updates are provided to this office -Discussed required documentation for our  office to request medical records from other offices -Filled out a form for a handicap sticker for pts car   Education and routine counseling performed. Handouts provided.  Medications Discontinued During This Encounter  Medication Reason  . pyridoxine (B6 NATURAL) 100 MG tablet Patient Preference  . lipase/protease/amylase (CREON) 12000 units CPEP capsule Patient Preference      No orders of the defined types were placed in this encounter.   The patient was counseled, risk factors were discussed, anticipatory guidance given.  Gross side effects, risk and benefits, and alternatives of medications discussed with patient.  Patient is aware that all medications have potential side effects and we are unable to predict every side effect or drug-drug interaction that may occur.  Expresses verbal understanding and consents to current therapy plan and treatment regimen.   Return for 4 months for diabetes, blood pressure, weight loss etc..   Please see AVS handed out to patient at the end of our visit for further patient instructions/ counseling done pertaining to today's office visit.    Note:  This document was prepared using Dragon voice recognition software and may include unintentional dictation errors.  This document serves as a record of services personally performed by [name], MD. It was created on her behalf by Georga Bora, a trained medical scribe. The creation of this record is based on the scribe's personal observations and the provider's statements to them.   I have reviewed the above medical documentation for accuracy and completeness and I concur.  [name], D.O.        Subjective:     Jasmine Davila is a 67 y.o. female who  presents to [facility] today for Diabetes Management.    Neuropathy -Pt states she has not been working out because of her neuropathy issues -States she has been to her neurologist and the  neurologist thinks she also has rheumatoid arthritis  -Has an appointment to be evaluated by a rheumatologist  -States her main symptoms are feeling like her feet are constantly swollen -Feels like her feet are going to "pop out of the skin" -Elevates her feet regularly and got an adjustable bed  DM HPI: A1C 6.4 today in OV - She has not been working on diet and exercise for diabetes  Pt is currently maintained on lantus and humalog, has been taking medication as proscribed  Home glucose readings range from 89 - 140 -States they have been high due to traveling and not eating as well as she does at home   Denies polyuria/polydipsia. Denies hypo/ hyperglycemia symptoms - She denies new onset of: chest pain, exercise intolerance, shortness of breath, dizziness, visual changes, headache, lower extremity swelling or claudication.   Last diabetic eye exam was No results found for: HMDIABEYEEXA  Foot exam- UTD  Last A1C in the office was:  Lab Results  Component Value Date   HGBA1C 6.4 (A) 09/19/2018   HGBA1C 6.2 (H) 03/09/2018   HGBA1C 5.7 (H) 12/08/2017    Lab Results  Component Value Date   MICROALBUR 10 03/04/2017   LDLCALC 86 04/15/2018   CREATININE 0.62 10/22/2018    Hepatic Function Latest Ref Rng & Units 10/22/2018 10/20/2018 10/18/2018  Total Protein 6.5 - 8.1 g/dL 7.8 6.3(L) 8.3(H)  Albumin 3.5 - 5.0 g/dL 4.1 3.2(L) 4.3  AST 15 - 41 U/L 41 83(H) 34  ALT 0 - 44 U/L 62(H) 110(H) 27  Alk Phosphatase 38 - 126 U/L 104 130(H) 85  Total Bilirubin 0.3 - 1.2 mg/dL 1.0 1.2 0.8  Bilirubin, Direct 0.1 - 0.5 mg/dL - - -     HTN HPI: -  Her blood pressure has  been controlled at home.  Pt is checking it at home.  -States they are usually  - Patient reports good compliance with blood pressure medications  - Denies medication S-E   - Smoking Status noted   - She denies new onset of: chest pain, exercise intolerance, shortness of breath, dizziness, visual changes, headache,  lower extremity swelling or claudication.    Last 3 blood pressure readings in our office are as follows: BP Readings from Last 3 Encounters:  10/22/18 (!) 162/84  07/27/18 120/68  07/14/18 126/64    There were no vitals filed for this visit.  Exercise -Pt states she has been moving more due to traveling for grandchildren's sport schedules -Pt acknowledges she has not been exercising as much as she needs to  -States her neuropathy has been causing her discomfort so she hasn't wanted to exercise   Last 3 blood pressure readings in our office are as follows: BP Readings from Last 3 Encounters:  10/22/18 (!) 162/84  07/27/18 120/68  07/14/18 126/64    BMI Readings from Last 3 Encounters:  10/18/18 36.81 kg/m  07/27/18 36.34 kg/m  07/14/18 36.56 kg/m     No problems updated.    Patient Care Team    Relationship Specialty Notifications Start End  [name], DO PCP - General Family Medicine  11/24/16   [name], MD Consulting Physician Oncology  11/24/16   Clarene Essex, MD Consulting Physician Gastroenterology  04/20/18  Alda Berthold, DO Consulting Physician Neurology  04/20/18   Edrick Kins, DPM Consulting Physician Podiatry  04/20/18      Patient Active Problem List   Diagnosis Date Noted  . Diabetes mellitus due to underlying condition with diabetic nephropathy, with long-term current use of insulin ([place]) 11/25/2016    Priority: High  . Hypertension associated with diabetes ([place]) 11/24/2016    Priority: High  . H/o Cancer:   B- cell lymphoma (spleen and lung) - non-Hodgkin's 11/24/2016    Priority: High  . Diabetic neuropathy - exaccerbated by chemo 11/24/2016    Priority: High  . Hyperlipidemia associated with type 2 diabetes mellitus ([place]) 11/24/2016    Priority: High  . Endometrial disorder- trace endometrial canal fluid seen on CT scan recently and in 2016.  No GYN care many yrs, to GYN for TVUS and further w/up prn 04/20/2018     Priority: Medium  . Morbid obesity ([place]) 11/25/2016    Priority: Medium  . Smoking hx 11/24/2016    Priority: Medium  . Chronic pancreatitis ([place]) 11/24/2016    Priority: Medium  . Vitamin B6 deficiency (non anemic) 11/24/2016    Priority: Medium  . GERD (gastroesophageal reflux disease) 11/24/2016    Priority: Low  . B12 deficiency 11/24/2016    Priority: Low  . Hypertension   . Depression 10/18/2018  . History of malignant lymphoma 06/03/2018  . Neuropathy 06/03/2018  . Diabetic peripheral neuropathy associated with type 2 diabetes mellitus ([place]) 04/20/2018  . Acute on chronic pancreatitis ([place]) 04/13/2018  . Onychomycosis 03/16/2018  . Neuropathic pain syndrome (non-herpetic) 03/16/2018  . Sleep apnea with CPAP 12/29/2016  . History of decreased platelet count 12/29/2016     Past Medical History:  Diagnosis Date  . Abnormal EKG    hx of right bundle branch block  . Acute pancreatitis    HX OF  . Anemia yrs ago  . Cancer ([place]) dx 2016   B cell lymphoma- Non Hodgkins in remission  . Diabetes mellitus ([place])    TYPE 2  . Diabetic neuropathy ([place])   . Diabetic neuropathy ([place])   . Endometrial disorder   . GERD (gastroesophageal reflux disease)   . Headache    tension or sinus  . Hypertension   . Malignant lymphoma ([place])   . Morbid obesity ([place])   . Peripheral neuropathy    BOTH FEET, SLIGHT NEUROPATHY IN HANDS  . Sleep apnea    USES CPAP at times  . Vitamin B 12 deficiency      Past Surgical History:  Procedure Laterality Date  . CHOLECYSTECTOMY  03/06/1996  . colomscopy    . DILATATION & CURETTAGE/HYSTEROSCOPY WITH MYOSURE N/A 07/04/2018   Procedure: DILATATION & CURETTAGE/HYSTEROSCOPY WITH MYOSURE;  Surgeon: Salvadore Dom, MD;  Location: [facility];  Service: Gynecology;  Laterality: N/A;  . MEDIPORT INSERTION, DOUBLE    . MEDIPORT REMOVAL       Family History  Problem Relation Age of Onset  . Cancer Mother 69       colon  .  Hypertension Mother   . Osteoporosis Mother   . Heart attack Father   . Heart disease Sister   . Alcohol abuse Maternal Grandfather      Social History   Substance and Sexual Activity  Drug Use No  ,  Social History   Substance and Sexual Activity  Alcohol Use No  ,  Social History   Tobacco Use  Smoking Status Former  Smoker  . Packs/day: 0.50  . Years: 4.00  . Pack years: 2.00  . Types: Cigarettes  . Last attempt to quit: 12/21/1968  . Years since quitting: 49.8  Smokeless Tobacco Never Used  Tobacco Comment   as teenager  ,    Current Outpatient Medications on File Prior to Visit  Medication Sig Dispense Refill  . aspirin EC 81 MG tablet Take 81 mg by mouth at bedtime.     . Cholecalciferol (VITAMIN D-3) 5000 units TABS Take 1 tablet by mouth daily.    . cyanocobalamin (,VITAMIN B-12,) 1000 MCG/ML injection Inject 0.5 mLs (500 mcg total) into the muscle every 30 (thirty) days. 10 mL 1  . fluticasone (FLONASE) 50 MCG/ACT nasal spray Place 1 spray into both nostrils daily as needed for allergies or rhinitis.    . folic acid (FOLVITE) 960 MCG tablet Take 400 mcg by mouth daily.    Marland Kitchen gabapentin (NEURONTIN) 300 MG capsule TAKE 4 CAPSULES BY MOUTH 3 TIMES DAILY. (Patient taking differently: Take 1,200 mg by mouth 3 (three) times daily. ) 360 capsule 5  . losartan (COZAAR) 25 MG tablet TAKE 1 TABLET BY MOUTH DAILY. 90 tablet 1  . Multiple Vitamin (MULTIVITAMIN) tablet Take 1 tablet by mouth daily.    . niacin (NIASPAN) 1000 MG CR tablet Take 1 tablet (1,000 mg total) by mouth at bedtime. An hour after 81 mg ASA 90 tablet 3  . NONFORMULARY OR COMPOUNDED ITEM [facility]  Peripheral Neuropathy Cream- Bupivacaine 1%, Doxepin 3%, Gabapentin 6%, Pentoxifylline 3%, Topiramate 1% Apply 1-2 grams to affected area 3-4 times daily Qty. 120 gm 3 refills (Patient not taking: Reported on 10/18/2018) 1 each 2  . nortriptyline (PAMELOR) 10 MG capsule Start nortriptyline '10mg'$  at  bedtime for 2 week, then increase to 2 tablet at bedtime (Patient taking differently: Take 10 mg by mouth at bedtime. ) 60 capsule 5   No current facility-administered medications on file prior to visit.      Allergies  Allergen Reactions  . Dilaudid [Hydromorphone Hcl] Other (See Comments)    anxious  . Doxycycline Other (See Comments)    Pancreatic issues  . Metronidazole Other (See Comments)    Cream  Burned face  . Pseudoephedrine Hcl Other (See Comments)    Pt unsure     Review of Systems:   General:  Denies fever, chills Optho/Auditory:   Denies visual changes, blurred vision Respiratory:   Denies SOB, cough, wheeze, DIB  Cardiovascular:   Denies chest pain, palpitations, painful respirations Gastrointestinal:   Denies nausea, vomiting, diarrhea.  Endocrine:     Denies new hot or cold intolerance Musculoskeletal:  Denies joint swelling, gait issues, or new unexplained myalgias/ arthralgias Skin:  Denies rash, suspicious lesions  Neurological:    Denies dizziness, unexplained weakness, numbness  Psychiatric/Behavioral:   Denies mood changes    Objective:     Last menstrual period 12/21/1998.  There is no height or weight on file to calculate BMI.  General: Well Developed, well nourished, and in no acute distress.  HEENT: Normocephalic, atraumatic, pupils equal round reactive to light, neck supple, No carotid bruits, no JVD Skin: Warm and dry, cap RF less 2 sec Cardiac: Regular rate and rhythm, S1, S2 WNL's, no murmurs rubs or gallops Respiratory: ECTA B/L, Not using accessory muscles, speaking in full sentences. NeuroM-Sk: Ambulates w/o assistance, moves ext * 4 w/o difficulty, sensation grossly intact.  Ext: scant edema b/l lower ext Psych: No HI/SI, judgement and insight  good, Euthymic mood. Full Affect.

## 2018-09-19 NOTE — Patient Instructions (Signed)
Melissa please put in a request to obtain patient's medical records from Dr. Hosie Poisson from [facility]- her Onc doc   Behavior Modification Ideas for Weight Management  Weight management involves adopting a healthy lifestyle that includes a knowledge of nutrition and exercise, a positive attitude and the right kind of motivation. Internal motives such as better health, increased energy, self-esteem and personal control increase your chances of lifelong weight management success.  Remember to have realistic goals and think long-term success. Believe in yourself and you can do it. The following information will give you ideas to help you meet your goals.  Control Your Home Environment  Eat only while sitting down at the kitchen or dining room table. Do not eat while watching television, reading, cooking, talking on the phone, standing at the refrigerator or working on the computer. Keep tempting foods out of the house - don't buy them. Keep tempting foods out of sight. Have low-calorie foods ready to eat. Unless you are preparing a meal, stay out of the kitchen. Have healthy snacks at your disposal, such as small pieces of fruit, vegetables, canned fruit, pretzels, low-fat string cheese and nonfat cottage cheese.  Control Your Work Environment  Do not eat at [employer] or keep tempting snacks at your desk. If you get hungry between meals, plan healthy snacks and bring them with you to work. During your breaks, go for a walk instead of eating. If you work around food, plan in advance the one item you will eat at mealtime. Make it inconvenient to nibble on food by chewing gum, sugarless candy or drinking water or another low-calorie beverage. Do not work through meals. Skipping meals slows down metabolism and may result in overeating at the next meal. If food is available for special occasions, either pick the healthiest item, nibble on low-fat snacks brought from home, don't  have anything offered, choose one option and have a small amount, or have only a beverage.  Control Your Mealtime Environment  Serve your plate of food at the stove or kitchen counter. Do not put the serving dishes on the table. If you do put dishes on the table, remove them immediately when finished eating. Fill half of your plate with vegetables, a quarter with lean protein and a quarter with starch. Use smaller plates, bowls and glasses. A smaller portion will look large when it is in a little dish. Politely refuse second helpings. When fixing your plate, limit portions of food to one scoop/serving or less.   Daily Food Management  Replace eating with another activity that you will not associate with food. Wait 20 minutes before eating something you are craving. Drink a large glass of water or diet soda before eating. Always have a big glass or bottle of water to drink throughout the day. Avoid high-calorie add-ons such as cream with your coffee, butter, mayonnaise and salad dressings.  Shopping: Do not shop when hungry or tired. Shop from a list and avoid buying anything that is not on your list. If you must have tempting foods, buy individual-sized packages and try to find a lower-calorie alternative. Don't taste test in the store. Read food labels. Compare products to help you make the healthiest choices.  Preparation: Chew a piece of gum while cooking meals. Use a quarter teaspoon if you taste test your food. Try to only fix what you are going to eat, leaving yourself no chance for seconds. If you have prepared more food than you need, portion  it into individual containers and freeze or refrigerate immediately. Don't snack while cooking meals.  Eating: Eat slowly. Remember it takes about 20 minutes for your stomach to send a message to your brain that it is full. Don't let fake hunger make you think you need more. The ideal way to eat is to take a bite, put your utensil  down, take a sip of water, cut your next bite, take a bit, put your utensil down and so on. Do not cut your food all at one time. Cut only as needed. Take small bites and chew your food well. Stop eating for a minute or two at least once during a meal or snack. Take breaks to reflect and have conversation.  Cleanup and Leftovers: Label leftovers for a specific meal or snack. Freeze or refrigerate individual portions of leftovers. Do not clean up if you are still hungry.  Eating Out and Social Eating  Do not arrive hungry. Eat something light before the meal. Try to fill up on low-calorie foods, such as vegetables and fruit, and eat smaller portions of the high-calorie foods. Eat foods that you like, but choose small portions. If you want seconds, wait at least 20 minutes after you have eaten to see if you are actually hungry or if your eyes are bigger than your stomach. Limit alcoholic beverages. Try a soda water with a twist of lime. Do not skip other meals in the day to save room for the special event.  At Restaurants: Order  la carte rather than buffet style. Order some vegetables or a salad for an appetizer instead of eating bread. If you order a high-calorie dish, share it with someone. Try an after-dinner mint with your coffee. If you do have dessert, share it with two or more people. Don't overeat because you do not want to waste food. Ask for a doggie bag to take extra food home. Tell the server to put half of your entree in a to go bag before the meal is served to you. Ask for salad dressing, gravy or high-fat sauces on the side. Dip the tip of your fork in the dressing before each bite. If bread is served, ask for only one piece. Try it plain without butter or oil. At [employer] where oil and vinegar is served with bread, use only a small amount of oil and a lot of vinegar for dipping.  At a Friend's House: Offer to bring a dish, appetizer or dessert that is low in  calories. Serve yourself small portions or tell the host that you only want a small amount. Stand or sit away from the snack table. Stay away from the kitchen or stay busy if you are near the food. Limit your alcohol intake.  At [employer] and Cafeterias: Cover most of your plate with lettuce and/or vegetables. Use a salad plate instead of a dinner plate. After eating, clear away your dishes before having coffee or tea.  Entertaining at Home: Explore low-fat, low-cholesterol cookbooks. Use single-serving foods like chicken breasts or hamburger patties. Prepare low-calorie appetizers and desserts.   Holidays: Keep tempting foods out of sight. Decorate the house without using food. Have low-calorie beverages and foods on hand for guests. Allow yourself one planned treat a day. Don't skip meals to save up for the holiday feast. Eat regular, planned meals.   Exercise Well  Make exercise a priority and a planned activity in the day. If possible, walk the entire or part of the distance  to work. Get an exercise buddy. Go for a walk with a colleague during one of your breaks, go to the gym, run or take a walk with a friend, walk in the mall with a shopping companion. Park at the end of the parking lot and walk to the store or office entrance. Always take the stairs all of the way or at least part of the way to your floor. If you have a desk job, walk around the office frequently. Do leg lifts while sitting at your desk. Do something outside on the weekends like going for a hike or a bike ride.   Have a Healthy Attitude  Make health your weight management priority. Be realistic. Have a goal to achieve a healthier you, not necessarily the lowest weight or ideal weight based on calculations or tables. Focus on a healthy eating style, not on dieting. Dieting usually lasts for a short amount of time and rarely produces long-term success. Think long term. You are developing new healthy  behaviors to follow next month, in a year and in a decade.    This information is for educational purposes only and is not intended to replace the advice of your doctor or health care provider. We encourage you to discuss with your doctor any questions or concerns you may have.        Guidelines for Losing Weight   We want weight loss that will last so you should lose 1-2 pounds a week.  THAT IS IT! Please pick THREE things a month to change. Once it is a habit check off the item. Then pick another three items off the list to become habits.  If you are already doing a habit on the list GREAT!  Cross that item off!  Don't drink your calories. Ie, alcohol, soda, fruit juice, and sweet tea.   Drink more water. Drink a glass when you feel hungry or before each meal.   Eat breakfast - Complex carb and protein (likeDannon light and fit yogurt, oatmeal, fruit, eggs, [place] bacon).  Measure your cereal.  Eat no more than one cup a day. (ie Kashi)  Eat an apple a day.  Add a vegetable a day.  Try a new vegetable a month.  Use Pam! Stop using oil or butter to cook.  Don't finish your plate or use smaller plates.  Share your dessert.  Eat sugar free Jello for dessert or frozen grapes.  Don't eat 2-3 hours before bed.  Switch to whole wheat bread, pasta, and brown rice.  Make healthier choices when you eat out. No fries!  Pick baked chicken, NOT fried.  Don't forget to SLOW DOWN when you eat. It is not going anywhere.   Take the stairs.  Park far away in the parking lot  Lift soup cans (or weights) for 10 minutes while watching TV.  Walk at work for 10 minutes during break.  Walk outside 1 time a week with your friend, kids, dog, or significant other.  Start a walking group at church.  Walk the mall as much as you can tolerate.   Keep a food diary.  Weigh yourself daily.  Walk for 15 minutes 3 days per week.  Cook at home more often and eat out less. If life  happens and you go back to old habits, it is okay.  Just start over. You can do it!  If you experience chest pain, get short of breath, or tired during the exercise, please stop immediately  and inform your doctor.    Before you even begin to attack a weight-loss plan, it pays to remember this: You are not fat. You have fat. Losing weight isn't about blame or shame; it's simply another achievement to accomplish. Dieting is like any other skill-you have to buckle down and work at it. As long as you act in a smart, reasonable way, you'll ultimately get where you want to be. Here are some weight loss pearls for you.   1. It's Not a Diet. It's a Lifestyle Thinking of a diet as something you're on and suffering through only for the short term doesn't work. To shed weight and keep it off, you need to make permanent changes to the way you eat. It's OK to indulge occasionally, of course, but if you cut calories temporarily and then revert to your old way of eating, you'll gain back the weight quicker than you can say yo-yo. Use it to lose it. Research shows that one of the best predictors of long-term weight loss is how many pounds you drop in the first month. For that reason, nutritionists often suggest being stricter for the first two weeks of your new eating strategy to build momentum. Cut out added sugar and alcohol and avoid unrefined carbs. After that, figure out how you can reincorporate them in a way that's healthy and maintainable.  2. There's a Right Way to Exercise Working out burns calories and fat and boosts your metabolism by building muscle. But those trying to lose weight are notorious for overestimating the number of calories they burn and underestimating the amount they take in. Unfortunately, your system is biologically programmed to hold on to extra pounds and that means when you start exercising, your body senses the deficit and ramps up its hunger signals. If you're not diligent, you'll eat  everything you burn and then some. Use it, to lose it. Cardio gets all the exercise glory, but strength and interval training are the real heroes. They help you build lean muscle, which in turn increases your metabolism and calorie-burning ability 3. Don't Overreact to Mild Hunger Some people have a hard time losing weight because of hunger anxiety. To them, being hungry is bad-something to be avoided at all costs-so they carry snacks with them and eat when they don't need to. Others eat because they're stressed out or bored. While you never want to get to the point of being ravenous (that's when bingeing is likely to happen), a hunger pang, a craving, or the fact that it's 3:00 p.m. should not send you racing for the vending machine or obsessing about the energy bar in your purse. Ideally, you should put off eating until your stomach is growling and it's difficult to concentrate.  Use it to lose it. When you feel the urge to eat, use the HALT method. Ask yourself, Am I really hungry? Or am I angry or anxious, lonely or bored, or tired? If you're still not certain, try the apple test. If you're truly hungry, an apple should seem delicious; if it doesn't, something else is going on. Or you can try drinking water and making yourself busy, if you are still hungry try a healthy snack.  4. Not All Calories Are Created Equal The mechanics of weight loss are pretty simple: Take in fewer calories than you use for energy. But the kind of food you eat makes all the difference. Processed food that's high in saturated fat and refined starch or sugar can cause  inflammation that disrupts the hormone signals that tell your brain you're full. The result: You eat a lot more.  Use it to lose it. Clean up your diet. Swap in whole, unprocessed foods, including vegetables, lean protein, and healthy fats that will fill you up and give you the biggest nutritional bang for your calorie buck. In a few weeks, as your brain starts  receiving regular hunger and fullness signals once again, you'll notice that you feel less hungry overall and naturally start cutting back on the amount you eat.  5. Protein, Produce, and Plant-Based Fats Are Your Weight-Loss Trinity Here's why eating the three Ps regularly will help you drop pounds. Protein fills you up. You need it to build lean muscle, which keeps your metabolism humming so that you can torch more fat. People in a weight-loss program who ate double the recommended daily allowance for protein (about 110 grams for a 150-pound woman) lost 70 percent of their weight from fat, while people who ate the RDA lost only about 40 percent, one study found. Produce is packed with filling fiber. "It's very difficult to consume too many calories if you're eating a lot of vegetables. Example: Three cups of broccoli is a lot of food, yet only 93 calories. (Fruit is another story. It can be easy to overeat and can contain a lot of calories from sugar, so be sure to monitor your intake.) Plant-based fats like olive oil and those in avocados and nuts are healthy and extra satiating.  Use it to lose it. Aim to incorporate each of the three Ps into every meal and snack. People who eat protein throughout the day are able to keep weight off, according to a study in the [place] [employer]. In addition to meat, poultry and seafood, good sources are beans, lentils, eggs, tofu, and yogurt. As for fat, keep portion sizes in check by measuring out salad dressing, oil, and nut butters (shoot for one to two tablespoons). Finally, eat veggies or a little fruit at every meal. People who did that consumed 308 fewer calories but didn't feel any hungrier than when they didn't eat more produce.  7. How You Eat Is As Important As What You Eat In order for your brain to register that you're full, you need to focus on what you're eating. Sit down whenever you eat, preferably at a table. Turn off the TV or  computer, put down your phone, and look at your food. Smell it. Chew slowly, and don't put another bite on your fork until you swallow. When women ate lunch this attentively, they consumed 30 percent less when snacking later than those who listened to an audiobook at lunchtime, according to a study in the [employer]. 8. Weighing Yourself Really Works The scale provides the best evidence about whether your efforts are paying off. Seeing the numbers tick up or down or stagnate is motivation to keep going-or to rethink your approach. A 2015 study at [facility] found that daily weigh-ins helped people lose more weight, keep it off, and maintain that loss, even after two years. Use it to lose it. Step on the scale at the same time every day for the best results. If your weight shoots up several pounds from one weigh-in to the next, don't freak out. Eating a lot of salt the night before or having your period is the likely culprit. The number should return to normal in a day or two. It's a steady  climb that you need to do something about. 9. Too Much Stress and Too Little Sleep Are Your Enemies When you're tired and frazzled, your body cranks up the production of cortisol, the stress hormone that can cause carb cravings. Not getting enough sleep also boosts your levels of ghrelin, a hormone associated with hunger, while suppressing leptin, a hormone that signals fullness and satiety. People on a diet who slept only five and a half hours a night for two weeks lost 55 percent less fat and were hungrier than those who slept eight and a half hours, according to a study in the [place]. Use it to lose it. Prioritize sleep, aiming for seven hours or more a night, which research shows helps lower stress. And make sure you're getting quality zzz's. If a snoring spouse or a fidgety cat wakes you up frequently throughout the night, you may end up getting the equivalent of  just four hours of sleep, according to a study from [facility]. Keep pets out of the bedroom, and use a white-noise app to drown out snoring. 10. You Will Hit a plateau-And You Can Bust Through It As you slim down, your body releases much less leptin, the fullness hormone.  If you're not strength training, start right now. Building muscle can raise your metabolism to help you overcome a plateau. To keep your body challenged and burning calories, incorporate new moves and more intense intervals into your workouts or add another sweat session to your weekly routine. Alternatively, cut an extra 100 calories or so a day from your diet. Now that you've lost weight, your body simply doesn't need as much fuel.    Since food equals calories, in order to lose weight you must either eat fewer calories, exercise more to burn off calories with activity, or both. Food that is not used to fuel the body is stored as fat. A major component of losing weight is to make smarter food choices. Here's how:  1)   Limit non-nutritious foods, such as: Sugar, honey, syrups and candy Pastries, donuts, pies, cakes and cookies Soft drinks, sweetened juices and alcoholic beverages  2)  Cut down on high-fat foods by: - Choosing poultry, fish or lean red meat - Choosing low-fat cooking methods, such as baking, broiling, steaming, grilling and boiling - Using low-fat or non-fat dairy products - Using vinaigrette, herbs, lemon or fat-free salad dressings - Avoiding fatty meats, such as bacon, sausage, franks, ribs and luncheon meats - Avoiding high-fat snacks like nuts, chips and chocolate - Avoiding fried foods - Using less butter, margarine, oil and mayonnaise - Avoiding high-fat gravies, cream sauces and cream-based soups  3) Eat a variety of foods, including: - Fruit and vegetables that are raw, steamed or baked - Whole grains, breads, cereal, rice and pasta - Dairy products, such as low-fat or non-fat milk  or yogurt, low-fat cottage cheese and low-fat cheese - Protein-rich foods like chicken, [place], fish, lean meat and legumes, or beans  4) Change your eating habits by: - Eat three balanced meals a day to help control your hunger - Watch portion sizes and eat small servings of a variety of foods - Choose low-calorie snacks - Eat only when you are hungry and stop when you are satisfied - Eat slowly and try not to perform other tasks while eating - Find other activities to distract you from food, such as walking, taking up a hobby or being involved in the community - Include  regular exercise in your daily routine ( minimum of 20 min of moderate-intensity exercise at least 5 days/week)  - Find a support group, if necessary, for emotional support in your weight loss journey           Easy ways to cut 100 calories   1. Eat your eggs with hot sauce OR salsa instead of cheese.  Eggs are great for breakfast, but many people consider eggs and cheese to be BFFs. Instead of cheese-1 oz. of cheddar has 114 calories-top your eggs with hot sauce, which contains no calories and helps with satiety and metabolism. Salsa is also a great option!!  2. Top your toast, waffles or pancakes with fresh berries instead of jelly or syrup. Half a cup of berries-fresh, frozen or thawed-has about 40 calories, compared with 2 tbsp. of maple syrup or jelly, which both have about 100 calories. The berries will also give you a good punch of fiber, which helps keep you full and satisfied and won't spike blood sugar quickly like the jelly or syrup. 3. Swap the non-fat latte for black coffee with a splash of half-and-half. Contrary to its name, that non-fat latte has 130 calories and a startling 19g of carbohydrates per 16 oz. serving. Replacing that 'light' drinkable dessert with a black coffee with a splash of half-and-half saves you more than 100 calories per 16 oz. serving. 4. Sprinkle salads with freeze-dried  raspberries instead of dried cranberries. If you want a sweet addition to your nutritious salad, stay away from dried cranberries. They have a whopping 130 calories per  cup and 30g carbohydrates. Instead, sprinkle freeze-dried raspberries guilt-free and save more than 100 calories per  cup serving, adding 3g of belly-filling fiber. 5. Go for mustard in place of mayo on your sandwich. Mustard can add really nice flavor to any sandwich, and there are tons of varieties, from spicy to honey. A serving of mayo is 95 calories, versus 10 calories in a serving of mustard.  Or try an avocado mayo spread: You can find the recipe few click this link: https://www.californiaavocado.com/recipes/recipe-container/[place]-avocado-mayo 6. Choose a DIY salad dressing instead of the store-bought kind. Mix Dijon or whole grain mustard with low-fat Kefir or red wine vinegar and garlic. 7. Use hummus as a spread instead of a dip. Use hummus as a spread on a high-fiber cracker or tortilla with a sandwich and save on calories without sacrificing taste. 8. Pick just one salad "accessory." Salad isn't automatically a calorie winner. It's easy to over-accessorize with toppings. Instead of topping your salad with nuts, avocado and cranberries (all three will clock in at 313 calories), just pick one. The next day, choose a different accessory, which will also keep your salad interesting. You don't wear all your jewelry every day, right? 9. Ditch the white pasta in favor of spaghetti squash. One cup of cooked spaghetti squash has about 40 calories, compared with traditional spaghetti, which comes with more than 200. Spaghetti squash is also nutrient-dense. It's a good source of fiber and Vitamins A and C, and it can be eaten just like you would eat pasta-with a great tomato sauce and [place] meatballs or with pesto, tofu and spinach, for example. 10. Dress up your chili, soups and stews with non-fat [place] yogurt instead of sour  cream. Just a 'dollop' of sour cream can set you back 115 calories and a whopping 12g of fat-seven of which are of the artery-clogging variety. Added bonus: [place] yogurt is packed with muscle-building protein,  calcium and B Vitamins. 11. Mash cauliflower instead of mashed potatoes. One cup of traditional mashed potatoes-in all their creamy goodness-has more than 200 calories, compared to mashed cauliflower, which you can typically eat for less than 100 calories per 1 cup serving. Cauliflower is a great source of the antioxidant indole-3-carbinol (I3C), which may help reduce the risk of some cancers, like breast cancer. 12. Ditch the ice cream sundae in favor of a [place] yogurt parfait. Instead of a cup of ice cream or fro-yo for dessert, try 1 cup of nonfat Greek yogurt topped with fresh berries and a sprinkle of cacao nibs. Both toppings are packed with antioxidants, which can help reduce cellular inflammation and oxidative damage. And the comparison is a no-brainer: One cup of ice cream has about 275 calories; one cup of frozen yogurt has about 230; and a cup of Greek yogurt has just 130, plus twice the protein, so you're less likely to return to the freezer for a second helping. 13. Put olive oil in a spray container instead of using it directly from the bottle. Each tablespoon of olive oil is 120 calories and 15g of fat. Use a mister instead of pouring it straight into the pan or onto a salad. This allows for portion control and will save you more than 100 calories. 14. When baking, substitute canned pumpkin for butter or oil. Canned pumpkin-not pumpkin pie mix-is loaded with Vitamin A, which is important for skin and eye health, as well as immunity. And the comparisons are pretty crazy:  cup of canned pumpkin has about 40 calories, compared to butter or oil, which has more than 800 calories. Yes, 800 calories. Applesauce and mashed banana can also serve as good substitutions for butter or oil, usually  in a 1:1 ratio. 15. Top casseroles with high-fiber cereal instead of breadcrumbs. Breadcrumbs are typically made with white bread, while breakfast cereals contain 5-9g of fiber per serving. Not only will you save more than 150 calories per  cup serving, the swap will also keep you more full and you'll get a metabolism boost from the added fiber. 16. Snack on pistachios instead of macadamia nuts. Believe it or not, you get the same amount of calories from 35 pistachios (100 calories) as you would from only five macadamia nuts. 17. Chow down on kale chips rather than potato chips. This is my favorite 'don't knock it 'till you try it' swap. Kale chips are so easy to make at home, and you can spice them up with a little grated parmesan or chili powder. Plus, they're a mere fraction of the calories of potato chips, but with the same crunch factor we crave so often. 18. Add seltzer and some fruit slices to your cocktail instead of soda or fruit juice. One cup of soda or fruit juice can pack on as much as 140 calories. Instead, use seltzer and fruit slices. The fruit provides valuable phytochemicals, such as flavonoids and anthocyanins, which help to combat cancer and stave off the aging process.

## 2018-09-27 ENCOUNTER — Other Ambulatory Visit: Payer: Self-pay | Admitting: Family Medicine

## 2018-10-05 DIAGNOSIS — E669 Obesity, unspecified: Secondary | ICD-10-CM | POA: Diagnosis not present

## 2018-10-05 DIAGNOSIS — M79671 Pain in right foot: Secondary | ICD-10-CM | POA: Diagnosis not present

## 2018-10-05 DIAGNOSIS — M79672 Pain in left foot: Secondary | ICD-10-CM | POA: Diagnosis not present

## 2018-10-05 DIAGNOSIS — M25571 Pain in right ankle and joints of right foot: Secondary | ICD-10-CM | POA: Diagnosis not present

## 2018-10-05 DIAGNOSIS — R768 Other specified abnormal immunological findings in serum: Secondary | ICD-10-CM | POA: Diagnosis not present

## 2018-10-05 DIAGNOSIS — M25572 Pain in left ankle and joints of left foot: Secondary | ICD-10-CM | POA: Diagnosis not present

## 2018-10-05 DIAGNOSIS — Z6837 Body mass index (BMI) 37.0-37.9, adult: Secondary | ICD-10-CM | POA: Diagnosis not present

## 2018-10-05 DIAGNOSIS — G8929 Other chronic pain: Secondary | ICD-10-CM | POA: Diagnosis not present

## 2018-10-05 DIAGNOSIS — M2550 Pain in unspecified joint: Secondary | ICD-10-CM | POA: Diagnosis not present

## 2018-10-17 ENCOUNTER — Other Ambulatory Visit: Payer: Self-pay | Admitting: Family Medicine

## 2018-10-18 ENCOUNTER — Encounter (HOSPITAL_COMMUNITY): Payer: Self-pay | Admitting: Emergency Medicine

## 2018-10-18 ENCOUNTER — Ambulatory Visit: Payer: Medicare Other | Admitting: Adult Health

## 2018-10-18 ENCOUNTER — Emergency Department (HOSPITAL_COMMUNITY): Payer: Medicare Other

## 2018-10-18 ENCOUNTER — Inpatient Hospital Stay (HOSPITAL_COMMUNITY)
Admission: EM | Admit: 2018-10-18 | Discharge: 2018-10-22 | DRG: 440 | Disposition: A | Discharge status: Home / Self Care | Payer: Medicare Other | Attending: Internal Medicine | Admitting: Internal Medicine

## 2018-10-18 DIAGNOSIS — K859 Acute pancreatitis without necrosis or infection, unspecified: Secondary | ICD-10-CM | POA: Diagnosis present

## 2018-10-18 DIAGNOSIS — Z794 Long term (current) use of insulin: Secondary | ICD-10-CM

## 2018-10-18 DIAGNOSIS — Z9221 Personal history of antineoplastic chemotherapy: Secondary | ICD-10-CM

## 2018-10-18 DIAGNOSIS — K861 Other chronic pancreatitis: Secondary | ICD-10-CM | POA: Diagnosis present

## 2018-10-18 DIAGNOSIS — R1110 Vomiting, unspecified: Secondary | ICD-10-CM | POA: Diagnosis not present

## 2018-10-18 DIAGNOSIS — E1121 Type 2 diabetes mellitus with diabetic nephropathy: Secondary | ICD-10-CM | POA: Diagnosis present

## 2018-10-18 DIAGNOSIS — Z881 Allergy status to other antibiotic agents status: Secondary | ICD-10-CM

## 2018-10-18 DIAGNOSIS — K219 Gastro-esophageal reflux disease without esophagitis: Secondary | ICD-10-CM | POA: Diagnosis not present

## 2018-10-18 DIAGNOSIS — E1142 Type 2 diabetes mellitus with diabetic polyneuropathy: Secondary | ICD-10-CM | POA: Diagnosis not present

## 2018-10-18 DIAGNOSIS — F329 Major depressive disorder, single episode, unspecified: Secondary | ICD-10-CM | POA: Diagnosis present

## 2018-10-18 DIAGNOSIS — Z9049 Acquired absence of other specified parts of digestive tract: Secondary | ICD-10-CM

## 2018-10-18 DIAGNOSIS — I4510 Unspecified right bundle-branch block: Secondary | ICD-10-CM | POA: Diagnosis not present

## 2018-10-18 DIAGNOSIS — Z79899 Other long term (current) drug therapy: Secondary | ICD-10-CM

## 2018-10-18 DIAGNOSIS — E0821 Diabetes mellitus due to underlying condition with diabetic nephropathy: Secondary | ICD-10-CM

## 2018-10-18 DIAGNOSIS — R1010 Upper abdominal pain, unspecified: Secondary | ICD-10-CM | POA: Diagnosis not present

## 2018-10-18 DIAGNOSIS — E538 Deficiency of other specified B group vitamins: Secondary | ICD-10-CM | POA: Diagnosis present

## 2018-10-18 DIAGNOSIS — Z6836 Body mass index (BMI) 36.0-36.9, adult: Secondary | ICD-10-CM

## 2018-10-18 DIAGNOSIS — K8500 Idiopathic acute pancreatitis without necrosis or infection: Principal | ICD-10-CM | POA: Diagnosis present

## 2018-10-18 DIAGNOSIS — Z800 Family history of malignant neoplasm of digestive organs: Secondary | ICD-10-CM

## 2018-10-18 DIAGNOSIS — Z8262 Family history of osteoporosis: Secondary | ICD-10-CM

## 2018-10-18 DIAGNOSIS — Z8572 Personal history of non-Hodgkin lymphomas: Secondary | ICD-10-CM

## 2018-10-18 DIAGNOSIS — Z885 Allergy status to narcotic agent status: Secondary | ICD-10-CM

## 2018-10-18 DIAGNOSIS — Z8249 Family history of ischemic heart disease and other diseases of the circulatory system: Secondary | ICD-10-CM

## 2018-10-18 DIAGNOSIS — I10 Essential (primary) hypertension: Secondary | ICD-10-CM | POA: Diagnosis not present

## 2018-10-18 DIAGNOSIS — G4733 Obstructive sleep apnea (adult) (pediatric): Secondary | ICD-10-CM | POA: Diagnosis present

## 2018-10-18 DIAGNOSIS — Z888 Allergy status to other drugs, medicaments and biological substances status: Secondary | ICD-10-CM

## 2018-10-18 DIAGNOSIS — Z87891 Personal history of nicotine dependence: Secondary | ICD-10-CM

## 2018-10-18 DIAGNOSIS — G4730 Sleep apnea, unspecified: Secondary | ICD-10-CM | POA: Diagnosis present

## 2018-10-18 LAB — COMPREHENSIVE METABOLIC PANEL
ALK PHOS: 85 U/L (ref 38–126)
ALT: 27 U/L (ref 0–44)
ANION GAP: 8 (ref 5–15)
AST: 34 U/L (ref 15–41)
Albumin: 4.3 g/dL (ref 3.5–5.0)
BILIRUBIN TOTAL: 0.8 mg/dL (ref 0.3–1.2)
BUN: 11 mg/dL (ref 8–23)
CALCIUM: 9.5 mg/dL (ref 8.9–10.3)
CO2: 30 mmol/L (ref 22–32)
CREATININE: 0.8 mg/dL (ref 0.44–1.00)
Chloride: 97 mmol/L — ABNORMAL LOW (ref 98–111)
GFR calc Af Amer: >60 mL/min (ref >60)
GFR calc non Af Amer: >60 mL/min (ref >60)
GLUCOSE: 201 mg/dL — AB (ref 70–99)
Potassium: 3.7 mmol/L (ref 3.5–5.1)
Sodium: 135 mmol/L (ref 135–145)
TOTAL PROTEIN: 8.3 g/dL — AB (ref 6.5–8.1)

## 2018-10-18 LAB — CBC
HCT: 42 % (ref 36.0–46.0)
Hemoglobin: 13.7 g/dL (ref 12.0–15.0)
MCH: 29 pg (ref 26.0–34.0)
MCHC: 32.6 g/dL (ref 30.0–36.0)
MCV: 88.8 fL (ref 80.0–100.0)
Platelets: 166 10*3/uL (ref 150–400)
RBC: 4.73 MIL/uL (ref 3.87–5.11)
RDW: 12.8 % (ref 11.5–15.5)
WBC: 9.9 10*3/uL (ref 4.0–10.5)
nRBC: 0 % (ref 0.0–0.2)

## 2018-10-18 LAB — POCT I-STAT TROPONIN I: Troponin i, poc: 0 ng/mL (ref 0.00–0.08)

## 2018-10-18 LAB — LIPASE, BLOOD: Lipase: 3544 U/L — ABNORMAL HIGH (ref 11–51)

## 2018-10-18 MED ORDER — MORPHINE SULFATE (PF) 4 MG/ML IV SOLN
6.0000 mg | Freq: Once | INTRAVENOUS | Status: AC
  Administered 2018-10-18: 6 mg via INTRAVENOUS
  Filled 2018-10-18: qty 2

## 2018-10-18 MED ORDER — ONDANSETRON HCL 4 MG/2ML IJ SOLN
4.0000 mg | Freq: Once | INTRAMUSCULAR | Status: AC
  Administered 2018-10-18: 4 mg via INTRAVENOUS
  Filled 2018-10-18: qty 2

## 2018-10-18 MED ORDER — INSULIN ASPART 100 UNIT/ML ~~LOC~~ SOLN
0.0000 [IU] | Freq: Three times a day (TID) | SUBCUTANEOUS | Status: DC
  Administered 2018-10-19: 1 [IU] via SUBCUTANEOUS
  Administered 2018-10-21: 3 [IU] via SUBCUTANEOUS
  Administered 2018-10-22: 1 [IU] via SUBCUTANEOUS
  Filled 2018-10-18: qty 1

## 2018-10-18 MED ORDER — IOPAMIDOL (ISOVUE-300) INJECTION 61%
INTRAVENOUS | Status: AC
  Filled 2018-10-18: qty 100

## 2018-10-18 MED ORDER — ZOLPIDEM TARTRATE 5 MG PO TABS: 5.0000 mg | ORAL_TABLET | Freq: Every evening | ORAL | Status: DC | PRN

## 2018-10-18 MED ORDER — CYANOCOBALAMIN 1000 MCG/ML IJ SOLN: 500.0000 ug | INTRAMUSCULAR | Status: DC

## 2018-10-18 MED ORDER — ONDANSETRON HCL 4 MG/2ML IJ SOLN
4.0000 mg | Freq: Four times a day (QID) | INTRAMUSCULAR | Status: DC | PRN
  Administered 2018-10-19: 4 mg via INTRAVENOUS
  Filled 2018-10-18: qty 2

## 2018-10-18 MED ORDER — POLYETHYLENE GLYCOL 3350 17 G PO PACK
17.0000 g | PACK | Freq: Every day | ORAL | Status: DC | PRN
  Filled 2018-10-18: qty 1

## 2018-10-18 MED ORDER — ONDANSETRON HCL 4 MG PO TABS: 4.0000 mg | ORAL_TABLET | Freq: Four times a day (QID) | ORAL | Status: DC | PRN

## 2018-10-18 MED ORDER — ACETAMINOPHEN 650 MG RE SUPP: 650.0000 mg | Freq: Four times a day (QID) | RECTAL | Status: DC | PRN

## 2018-10-18 MED ORDER — SODIUM CHLORIDE 0.9 % IJ SOLN
INTRAMUSCULAR | Status: AC
  Filled 2018-10-18: qty 50

## 2018-10-18 MED ORDER — ASPIRIN EC 81 MG PO TBEC
81.0000 mg | DELAYED_RELEASE_TABLET | Freq: Every day | ORAL | Status: DC
  Administered 2018-10-19 – 2018-10-21 (×3): 81 mg via ORAL
  Filled 2018-10-18 (×3): qty 1

## 2018-10-18 MED ORDER — GABAPENTIN 400 MG PO CAPS
1200.0000 mg | ORAL_CAPSULE | Freq: Three times a day (TID) | ORAL | Status: DC
  Administered 2018-10-19 – 2018-10-22 (×10): 1200 mg via ORAL
  Filled 2018-10-18 (×11): qty 3

## 2018-10-18 MED ORDER — VITAMIN D 1000 UNITS PO TABS
5000.0000 [IU] | ORAL_TABLET | Freq: Every day | ORAL | Status: DC
  Administered 2018-10-19 – 2018-10-22 (×4): 5000 [IU] via ORAL
  Filled 2018-10-18 (×4): qty 5

## 2018-10-18 MED ORDER — FOLIC ACID 1 MG PO TABS
500.0000 ug | ORAL_TABLET | Freq: Every day | ORAL | Status: DC
  Administered 2018-10-19 – 2018-10-22 (×4): 0.5 mg via ORAL
  Filled 2018-10-18 (×5): qty 1

## 2018-10-18 MED ORDER — ENOXAPARIN SODIUM 40 MG/0.4ML ~~LOC~~ SOLN
40.0000 mg | SUBCUTANEOUS | Status: DC
  Administered 2018-10-20 – 2018-10-22 (×3): 40 mg via SUBCUTANEOUS
  Filled 2018-10-18 (×4): qty 0.4

## 2018-10-18 MED ORDER — SODIUM CHLORIDE 0.9 % IV SOLN
INTRAVENOUS | Status: DC
  Administered 2018-10-19 – 2018-10-22 (×8): via INTRAVENOUS

## 2018-10-18 MED ORDER — FAMOTIDINE 20 MG PO TABS
20.0000 mg | ORAL_TABLET | Freq: Two times a day (BID) | ORAL | Status: DC
  Administered 2018-10-19 – 2018-10-22 (×7): 20 mg via ORAL
  Filled 2018-10-18 (×7): qty 1

## 2018-10-18 MED ORDER — LOSARTAN POTASSIUM 25 MG PO TABS
25.0000 mg | ORAL_TABLET | Freq: Every day | ORAL | Status: DC
  Administered 2018-10-19 – 2018-10-22 (×4): 25 mg via ORAL
  Filled 2018-10-18 (×4): qty 1

## 2018-10-18 MED ORDER — INSULIN GLARGINE 100 UNIT/ML ~~LOC~~ SOLN
38.0000 [IU] | Freq: Every day | SUBCUTANEOUS | Status: DC
  Administered 2018-10-21: 38 [IU] via SUBCUTANEOUS
  Filled 2018-10-18 (×4): qty 0.38

## 2018-10-18 MED ORDER — IOPAMIDOL (ISOVUE-300) INJECTION 61%
100.0000 mL | Freq: Once | INTRAVENOUS | Status: AC | PRN
  Administered 2018-10-18: 100 mL via INTRAVENOUS

## 2018-10-18 MED ORDER — ADULT MULTIVITAMIN W/MINERALS CH
1.0000 | ORAL_TABLET | Freq: Every day | ORAL | Status: DC
  Administered 2018-10-19 – 2018-10-22 (×4): 1 via ORAL
  Filled 2018-10-18 (×5): qty 1

## 2018-10-18 MED ORDER — ACETAMINOPHEN 325 MG PO TABS
650.0000 mg | ORAL_TABLET | Freq: Four times a day (QID) | ORAL | Status: DC | PRN
  Administered 2018-10-20: 650 mg via ORAL

## 2018-10-18 MED ORDER — MORPHINE SULFATE (PF) 4 MG/ML IV SOLN
6.0000 mg | INTRAVENOUS | Status: DC | PRN
  Administered 2018-10-19: 6 mg via INTRAVENOUS
  Filled 2018-10-18: qty 2

## 2018-10-18 MED ORDER — MORPHINE SULFATE (PF) 4 MG/ML IV SOLN
4.0000 mg | Freq: Once | INTRAVENOUS | Status: AC
  Administered 2018-10-18 – 2018-10-19 (×2): 4 mg via INTRAVENOUS
  Filled 2018-10-18: qty 1

## 2018-10-18 MED ORDER — SODIUM CHLORIDE 0.9 % IV BOLUS
1000.0000 mL | Freq: Once | INTRAVENOUS | Status: AC
  Administered 2018-10-18: 1000 mL via INTRAVENOUS

## 2018-10-18 MED ORDER — NIACIN ER (ANTIHYPERLIPIDEMIC) 500 MG PO TBCR
1000.0000 mg | EXTENDED_RELEASE_TABLET | Freq: Every day | ORAL | Status: DC
  Administered 2018-10-19 – 2018-10-21 (×4): 1000 mg via ORAL
  Filled 2018-10-18 (×5): qty 2

## 2018-10-18 MED ORDER — FLUTICASONE PROPIONATE 50 MCG/ACT NA SUSP
1.0000 | Freq: Every day | NASAL | Status: DC | PRN
  Filled 2018-10-18: qty 16

## 2018-10-18 MED ORDER — MORPHINE SULFATE (PF) 4 MG/ML IV SOLN
4.0000 mg | Freq: Once | INTRAVENOUS | Status: AC
  Administered 2018-10-18: 4 mg via INTRAVENOUS
  Filled 2018-10-18: qty 1

## 2018-10-18 MED ORDER — HYDRALAZINE HCL 20 MG/ML IJ SOLN
5.0000 mg | INTRAMUSCULAR | Status: DC | PRN
  Filled 2018-10-18: qty 0.25

## 2018-10-18 MED ORDER — NORTRIPTYLINE HCL 10 MG PO CAPS
10.0000 mg | ORAL_CAPSULE | Freq: Every day | ORAL | Status: DC
  Administered 2018-10-19 – 2018-10-21 (×4): 10 mg via ORAL
  Filled 2018-10-18 (×5): qty 1

## 2018-10-18 NOTE — ED Triage Notes (Signed)
Patient here from home with complaints of upper abd pain radiating under left breast over to right breast and SOB sudden onset 2 hours ago. Nausea, vomiting days ago, none today.

## 2018-10-18 NOTE — ED Notes (Signed)
PT INFORMED UA NEEDED

## 2018-10-18 NOTE — ED Provider Notes (Signed)
[facility] DEPT Provider Note   CSN: [id number] Arrival date & time: 10/18/18  1524     History   Chief Complaint Chief Complaint  Patient presents with  . Abdominal Pain  . Chest Pain    HPI Jasmine Davila is a 67 y.o. female.  The history is provided by the patient and medical records. No language interpreter was used.  Abdominal Pain    Chest Pain   Associated symptoms include abdominal pain.     67 year old female with history of diabetes, history of pancreatitis, hypertension presenting to the ED from home for evaluation of abdominal pain.  Patient mentioned that she has history of idiopathic pancreatitis spanning the past decade.  She has had 6 separate episodes.  Today she developed acute onset of left upper quadrant pain which she described as a sharp pressure sensation, intense, with increased pain when she takes a deep breath.  Pain felt similar to prior pancreatitis.  She endorsed nausea.  She denies having fever, shortness of breath, productive cough, dysuria, bowel bladder changes.  She is not a drinker, no recent medication changes, no recent travel.  Past Medical History:  Diagnosis Date  . Abnormal EKG    hx of right bundle branch block  . Acute pancreatitis    HX OF  . Anemia yrs ago  . Cancer ([place]) dx 2016   B cell lymphoma- Non Hodgkins in remission  . Diabetes mellitus ([place])    TYPE 2  . Diabetic neuropathy ([place])   . Diabetic neuropathy ([place])   . Endometrial disorder   . GERD (gastroesophageal reflux disease)   . Headache    tension or sinus  . Hypertension   . Malignant lymphoma ([place])   . Morbid obesity ([place])   . Peripheral neuropathy    BOTH FEET, SLIGHT NEUROPATHY IN HANDS  . Sleep apnea    USES CPAP at times  . Vitamin B 12 deficiency     Patient Active Problem List   Diagnosis Date Noted  . History of malignant lymphoma 06/03/2018  . Neuropathy 06/03/2018  . Endometrial disorder- trace  endometrial canal fluid seen on CT scan recently and in 2016.  No GYN care many yrs, to GYN for TVUS and further w/up prn 04/20/2018  . Diabetic peripheral neuropathy associated with type 2 diabetes mellitus ([place]) 04/20/2018  . Acute pancreatitis 04/13/2018  . Onychomycosis 03/16/2018  . Neuropathic pain syndrome (non-herpetic) 03/16/2018  . Sleep apnea with CPAP 12/29/2016  . History of decreased platelet count 12/29/2016  . Morbid obesity ([place]) 11/25/2016  . Diabetes mellitus due to underlying condition with diabetic nephropathy, with long-term current use of insulin ([place]) 11/25/2016  . Smoking hx 11/24/2016  . Hypertension associated with diabetes ([place]) 11/24/2016  . Chronic pancreatitis ([place]) 11/24/2016  . 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 Diabetic neuropathy - exaccerbated by chemo 11/24/2016  . GERD (gastroesophageal reflux disease) 11/24/2016  . Hyperlipidemia associated with type 2 diabetes mellitus ([place]) 11/24/2016  . B12 deficiency 11/24/2016  . Vitamin B6 deficiency (non anemic) 11/24/2016    Past Surgical History:  Procedure Laterality Date  . CHOLECYSTECTOMY  03/06/1996  . colomscopy    . DILATATION & CURETTAGE/HYSTEROSCOPY WITH MYOSURE N/A 07/04/2018   Procedure: DILATATION & CURETTAGE/HYSTEROSCOPY WITH MYOSURE;  Surgeon: Salvadore Dom, MD;  Location: [facility];  Service: Gynecology;  Laterality: N/A;  . MEDIPORT INSERTION, DOUBLE    . MEDIPORT REMOVAL  OB History    Gravida  2   Para  2   Term  2   Preterm      AB      Living  1     SAB      TAB      Ectopic      Multiple      Live Births  2            Home Medications    Prior to Admission medications   Medication Sig Start Date End Date Taking? Authorizing Provider  aspirin EC 81 MG tablet Take 81 mg by mouth at bedtime.     [provider]  Cholecalciferol (VITAMIN D-3) 5000 units TABS Take 1 tablet by  mouth daily.    [provider]  cyanocobalamin (,VITAMIN B-12,) 1000 MCG/ML injection Inject 0.5 mLs (500 mcg total) into the muscle every 30 (thirty) days. 04/05/18   Opalski, Deborah, DO  famotidine (PEPCID) 20 MG tablet TAKE 1 TABLET BY MOUTH 2 TIMES DAILY. 09/27/18   Opalski, Neoma Laming, DO  fluticasone (FLONASE) 50 MCG/ACT nasal spray Place 1 spray into both nostrils daily as needed for allergies or rhinitis.    [provider]  folic acid (FOLVITE) 299 MCG tablet Take 400 mcg by mouth daily.    [provider]  gabapentin (NEURONTIN) 300 MG capsule TAKE 4 CAPSULES BY MOUTH 3 TIMES DAILY. 09/05/18   Opalski, Deborah, DO  HUMALOG KWIKPEN 100 UNIT/ML KiwkPen INJECT 20 UNITS INTO THE SKIN DAILY WITH LARGEST MEAL 10/17/18   Opalski, Neoma Laming, DO  LANTUS SOLOSTAR 100 UNIT/ML Solostar Pen INJECT 60 UNITS INTO THE SKIN EVERY MORNING. 10/17/18   Opalski, Deborah, DO  losartan (COZAAR) 25 MG tablet TAKE 1 TABLET BY MOUTH DAILY. 07/18/18   [name], DO  Multiple Vitamin (MULTIVITAMIN) tablet Take 1 tablet by mouth daily.    [provider]  niacin (NIASPAN) 1000 MG CR tablet Take 1 tablet (1,000 mg total) by mouth at bedtime. An hour after 81 mg ASA 12/29/16   Opalski, Neoma Laming, DO  NONFORMULARY OR COMPOUNDED ITEM [facility]  Peripheral Neuropathy Cream- Bupivacaine 1%, Doxepin 3%, Gabapentin 6%, Pentoxifylline 3%, Topiramate 1% Apply 1-2 grams to affected area 3-4 times daily Qty. 120 gm 3 refills 04/04/18   Edrick Kins, DPM  nortriptyline (PAMELOR) 10 MG capsule Start nortriptyline 10mg  at bedtime for 2 week, then increase to 2 tablet at bedtime 07/27/18   Alda Berthold, DO    Family History Family History  Problem Relation Age of Onset  . Cancer Mother 82       colon  . Hypertension Mother   . Osteoporosis Mother   . Heart attack Father   . Heart disease Sister   . Alcohol abuse Maternal Grandfather     Social History Social History    Tobacco Use  . Smoking status: Former Smoker    Packs/day: 0.50    Years: 4.00    Pack years: 2.00    Types: Cigarettes    Last attempt to quit: 12/21/1968    Years since quitting: 49.8  . Smokeless tobacco: Never Used  . Tobacco comment: as teenager  Substance Use Topics  . Alcohol use: No  . Drug use: No     Allergies   Dilaudid [hydromorphone hcl]; Doxycycline; Metronidazole; and Pseudoephedrine hcl   Review of Systems Review of Systems  Cardiovascular: Positive for chest pain.  Gastrointestinal: Positive for abdominal pain.  All other systems reviewed  and are negative.    Physical Exam Updated Vital Signs BP (!) 147/69   Pulse 80   Temp 98 F (36.7 C) (Oral)   Resp 17   Ht 5\' 7"  (1.702 m)   Wt 106.6 kg   LMP 12/21/1998 (Approximate)   SpO2 98%   BMI 36.81 kg/m   Physical Exam  Constitutional: She appears well-developed and well-nourished. No distress.  HENT:  Head: Atraumatic.  Eyes: Pupils are equal, round, and reactive to light. Conjunctivae and EOM are normal.  Neck: Neck supple.  Cardiovascular: Normal rate and regular rhythm.  Pulmonary/Chest: Effort normal and breath sounds normal.  Abdominal: She exhibits distension. There is tenderness in the epigastric area and left upper quadrant. There is rigidity. Hernia confirmed negative in the ventral area.  Neurological: She is alert.  Skin: No rash noted.  Psychiatric: She has a normal mood and affect.  Nursing note and vitals reviewed.    ED Treatments / Results  Labs (all labs ordered are listed, but only abnormal results are displayed) Labs Reviewed  LIPASE, BLOOD - Abnormal; Notable for the following components:      Result Value   Lipase 3,544 (*)    All other components within normal limits  COMPREHENSIVE METABOLIC PANEL - Abnormal; Notable for the following components:   Chloride 97 (*)    Glucose, Bld 201 (*)    Total Protein 8.3 (*)    All other components within normal limits  CBC   URINALYSIS, ROUTINE W REFLEX MICROSCOPIC  I-STAT TROPONIN, ED  POCT I-STAT TROPONIN I    EKG None  Radiology Dg Chest 2 View  Result Date: 10/18/2018 CLINICAL DATA:  Upper abdominal pain radiating to the left breast. EXAM: CHEST - 2 VIEW COMPARISON:  04/13/2018 FINDINGS: The heart size and mediastinal contours are within normal limits. Mild atherosclerosis of the thoracic aorta. Both lungs are clear. The visualized skeletal structures are unremarkable. IMPRESSION: No active cardiopulmonary disease. Electronically Signed   By: Ashley Royalty M.D.   On: 10/18/2018 17:42   Ct Abdomen Pelvis W Contrast  Result Date: 10/18/2018 CLINICAL DATA:  Upper abdominal pain radiating to left breast and right breast. Shortness-of-breath sudden onset 2 hours ago. Nausea and vomiting several days ago. History of diffuse large B-cell lymphoma. EXAM: CT ABDOMEN AND PELVIS WITH CONTRAST TECHNIQUE: Multidetector CT imaging of the abdomen and pelvis was performed using the standard protocol following bolus administration of intravenous contrast. CONTRAST:  162mL ISOVUE-300 IOPAMIDOL (ISOVUE-300) INJECTION 61% COMPARISON:  08/31/2018, 04/13/2018 and 02/14/2015. FINDINGS: Lower chest: Minimal linear atelectasis/scarring posterior left lower lobe unchanged. Hepatobiliary: Previous cholecystectomy. Liver is within normal. There is mild prominence of the central intrahepatic ducts unchanged. Minimal prominence of the common bile duct unchanged. Pancreas: Moderate peripancreatic fluid/inflammation compatible with acute pancreatitis. No evidence of necrosis. Spleen: Couple small splenic hypodensities unchanged from 04/13/2018. Adrenals/Urinary Tract: Adrenal glands are normal. Kidneys are normal in size without hydronephrosis or nephrolithiasis. Ureters and bladder are normal. Stomach/Bowel: Stomach and small bowel are normal. Appendix is normal. Colon is within normal. Vascular/Lymphatic: Mild calcified plaque involving the  abdominal aorta. No adenopathy. Reproductive: Normal. Other: None. Musculoskeletal: Degenerative change of the spine. IMPRESSION: Evidence of acute pancreatitis. No necrosis and no other complications. Couple small splenic hypodensities unchanged from April 2019. Aortic Atherosclerosis (ICD10-I70.0). Electronically Signed   By: Marin Olp M.D.   On: 10/18/2018 22:48    Procedures Procedures (including critical care time)  Medications Ordered in ED Medications  iopamidol (ISOVUE-300) 61 %  injection (has no administration in time range)  sodium chloride 0.9 % injection (has no administration in time range)  sodium chloride 0.9 % bolus 1,000 mL (1,000 mLs Intravenous New Bag/Given 10/18/18 2318)  morphine 4 MG/ML injection 4 mg (4 mg Intravenous Given 10/18/18 2044)  ondansetron (ZOFRAN) injection 4 mg (4 mg Intravenous Given 10/18/18 2045)  sodium chloride 0.9 % bolus 1,000 mL (0 mLs Intravenous Stopped 10/18/18 2210)  morphine 4 MG/ML injection 4 mg (4 mg Intravenous Given 10/18/18 2210)  iopamidol (ISOVUE-300) 61 % injection 100 mL (100 mLs Intravenous Contrast Given 10/18/18 2221)  morphine 4 MG/ML injection 6 mg (6 mg Intravenous Given 10/18/18 2318)     Initial Impression / Assessment and Plan / ED Course  I have reviewed the triage vital signs and the nursing notes.  Pertinent labs & imaging results that were available during my care of the patient were reviewed by me and considered in my medical decision making (see chart for details).     BP (!) 167/78   Pulse 83   Temp 98 F (36.7 C) (Oral)   Resp 18   Ht 5\' 7"  (1.702 m)   Wt 106.6 kg   LMP 12/21/1998 (Approximate)   SpO2 97%   BMI 36.81 kg/m    Final Clinical Impressions(s) / ED Diagnoses   Final diagnoses:  Idiopathic acute pancreatitis without infection or necrosis    ED Discharge Orders    None     8:41 PM Patient with history of idiopathic recurrent pancreatitis with pain to her left upper quadrant  radiates to the back that started earlier today.  Labs remarkable for an elevated lipase of 3500.  Normal WBC.  Normal hepatic function panel.  Chest x-ray and troponin unremarkable.  Will obtain abdominal pelvic CT scan, will consult medicine for admission.  Appreciate consultation from [place] Dr. Therisa Doyne, who agrees with admission and will be involved in patient's care tomorrow.  11:02 PM Abdominal pelvis CT scan demonstrate evidence of acute pancreatitis without necrosis or other complication.  Patient continues to endorse pain, additional pain medication given.  Will consult hospitalist for admission.  11:23 PM Appreciate consultation from [facility] Dr. Blaine Hamper who agrees to see and admit pt for further management of her acute idiopathic pancreatitis.     Domenic Moras, PA-C 10/18/18 2324    Daleen Bo, MD 10/19/18 [phone]

## 2018-10-18 NOTE — H&P (Signed)
History and Physical    Jasmine Davila TIW:[id number] DOB: Aug 11, 1951 DOA: 10/18/2018  Referring MD/NP/PA:   PCP: [name], DO   Patient coming from:  The patient is coming from home.  At baseline, pt is independent for most of ADL.       Chief Complaint: Abdominal pain  HPI: Jasmine Davila is a 67 y.o. female with medical history significant of chronically recurrent pancreatitis without clear etiology identified, hypertension, diabetes mellitus, GERD, depression, obesity, OSA on CPAP, right bundle blockage, non-Hodgkin's lymphoma in remission in the past 3 years, who presents with abdominal pain.   Patient states that her abdominal pain started at about 2 PM, which is located mainly in the upper abdomen, constant, 10 out of 10 severity, sharp, radiating to lower chest and left lower quadrant.  It is aggravated by eating food.  Patient nausea, no vomiting or diarrhea.  No fever, but has chills.  Patient denies chest pain, cough.  No symptoms of UTI or unilateral weakness.    Of note, patient has 7 episodes of recurrent pancreatitis in the past.  Most recent admission was April  2019. She had MRCP which showed uncomplicated acute pancreatitis, no pancreatic necrosis or pancreas divisum, extrahepatic biliary dilatation postcholecystectomy and, no evidence of choledocholithiasis. Her triglycerides was 113 that time.  Patient denies drinking alcohol.  ED Course: pt was found to have lipase of 3544, negative troponin, electrolytes renal function okay, pending urinalysis, temperature normal, no tachycardia, oxygen saturation 95% on room air, negative chest x-ray.  CT abdomen/pelvis showed evidence of acute pancreatitis, no necrosis and no other complications, also showed couple of small splenic hypodensities unchanged from April 2019. Pt is placed on MedSurg bed for observation.  [facility] was consulted by EDP (ED physician did not remember GI doctors name), we will see patient  in the morning.  Review of Systems:   General: no fevers, has chills, no body weight gain, has poor appetite, has fatigue HEENT: no blurry vision, hearing changes or sore throat Respiratory: no dyspnea, coughing, wheezing CV: no chest pain, no palpitations GI: has nausea and abdominal pain, no vomiting, diarrhea, constipation GU: no dysuria, burning on urination, increased urinary frequency, hematuria  Ext: no leg edema Neuro: no unilateral weakness, numbness, or tingling, no vision change or hearing loss Skin: no rash, no skin tear. MSK: No muscle spasm, no deformity, no limitation of range of movement in spin Heme: No easy bruising.  Travel history: No recent long distant travel.  Allergy:  Allergies  Allergen Reactions  . Dilaudid [Hydromorphone Hcl] Other (See Comments)    anxious  . Doxycycline Other (See Comments)    Pancreatic issues  . Metronidazole Other (See Comments)    Cream  Burned face  . Pseudoephedrine Hcl Other (See Comments)    Pt unsure    Past Medical History:  Diagnosis Date  . Abnormal EKG    hx of right bundle branch block  . Acute pancreatitis    HX OF  . Anemia yrs ago  . Cancer ([place]) dx 2016   B cell lymphoma- Non Hodgkins in remission  . Diabetes mellitus ([place])    TYPE 2  . Diabetic neuropathy ([place])   . Diabetic neuropathy ([place])   . Endometrial disorder   . GERD (gastroesophageal reflux disease)   . Headache    tension or sinus  . Hypertension   . Malignant lymphoma ([place])   . Morbid obesity ([place])   . Peripheral neuropathy  BOTH FEET, SLIGHT NEUROPATHY IN HANDS  . Sleep apnea    USES CPAP at times  . Vitamin B 12 deficiency     Past Surgical History:  Procedure Laterality Date  . CHOLECYSTECTOMY  03/06/1996  . colomscopy    . DILATATION & CURETTAGE/HYSTEROSCOPY WITH MYOSURE N/A 07/04/2018   Procedure: DILATATION & CURETTAGE/HYSTEROSCOPY WITH MYOSURE;  Surgeon: Salvadore Dom, MD;  Location: [facility];   Service: Gynecology;  Laterality: N/A;  . MEDIPORT INSERTION, DOUBLE    . MEDIPORT REMOVAL      Social History:  reports that she quit smoking about 49 years ago. Her smoking use included cigarettes. She has a 2.00 pack-year smoking history. She has never used smokeless tobacco. She reports that she does not drink alcohol or use drugs.  Family History:  Family History  Problem Relation Age of Onset  . Cancer Mother 48       colon  . Hypertension Mother   . Osteoporosis Mother   . Heart attack Father   . Heart disease Sister   . Alcohol abuse Maternal Grandfather      Prior to Admission medications   Medication Sig Start Date End Date Taking? Authorizing Provider  aspirin EC 81 MG tablet Take 81 mg by mouth at bedtime.    Yes [provider]  Cholecalciferol (VITAMIN D-3) 5000 units TABS Take 1 tablet by mouth daily.   Yes [provider]  cyanocobalamin (,VITAMIN B-12,) 1000 MCG/ML injection Inject 0.5 mLs (500 mcg total) into the muscle every 30 (thirty) days. 04/05/18  Yes Opalski, Deborah, DO  famotidine (PEPCID) 20 MG tablet TAKE 1 TABLET BY MOUTH 2 TIMES DAILY. 09/27/18  Yes Opalski, Neoma Laming, DO  folic acid (FOLVITE) 027 MCG tablet Take 400 mcg by mouth daily.   Yes [provider]  gabapentin (NEURONTIN) 300 MG capsule TAKE 4 CAPSULES BY MOUTH 3 TIMES DAILY. Patient taking differently: Take 1,200 mg by mouth 3 (three) times daily.  09/05/18  Yes Opalski, Deborah, DO  HUMALOG KWIKPEN 100 UNIT/ML KiwkPen INJECT 20 UNITS INTO THE SKIN DAILY WITH LARGEST MEAL Patient taking differently: Inject 20 Units into the skin daily.  10/17/18  Yes Opalski, Deborah, DO  LANTUS SOLOSTAR 100 UNIT/ML Solostar Pen INJECT 60 UNITS INTO THE SKIN EVERY MORNING. Patient taking differently: Inject 60 Units into the skin daily.  10/17/18  Yes Opalski, Deborah, DO  losartan (COZAAR) 25 MG tablet TAKE 1 TABLET BY MOUTH DAILY. 07/18/18  Yes Opalski, Deborah, DO  Multiple Vitamin  (MULTIVITAMIN) tablet Take 1 tablet by mouth daily.   Yes [provider]  niacin (NIASPAN) 1000 MG CR tablet Take 1 tablet (1,000 mg total) by mouth at bedtime. An hour after 81 mg ASA 12/29/16  Yes Opalski, Neoma Laming, DO  nortriptyline (PAMELOR) 10 MG capsule Start nortriptyline 10mg  at bedtime for 2 week, then increase to 2 tablet at bedtime Patient taking differently: Take 10 mg by mouth at bedtime.  07/27/18  Yes Patel, Donika K, DO  fluticasone (FLONASE) 50 MCG/ACT nasal spray Place 1 spray into both nostrils daily as needed for allergies or rhinitis.    [provider]  NONFORMULARY OR COMPOUNDED ITEM [facility]  Peripheral Neuropathy Cream- Bupivacaine 1%, Doxepin 3%, Gabapentin 6%, Pentoxifylline 3%, Topiramate 1% Apply 1-2 grams to affected area 3-4 times daily Qty. 120 gm 3 refills Patient not taking: Reported on 10/18/2018 04/04/18   Edrick Kins, DPM    Physical Exam: Vitals:   10/18/18 2039 10/18/18  2202 10/18/18 2215 10/19/18 0130  BP: (!) 167/78 (!) 147/69 101/80 136/67  Pulse: 83 80 79 77  Resp: 18 17 20 18   Temp:      TempSrc:      SpO2: 97% 98% 95% 94%  Weight:      Height:       General: in moderate acute distress due to pain HEENT:       Eyes: PERRL, EOMI, no scleral icterus.       ENT: No discharge from the ears and nose, no pharynx injection, no tonsillar enlargement.        Neck: No JVD, no bruit, no mass felt. Heme: No neck lymph node enlargement. Cardiac: S1/S2, RRR, No murmurs, No gallops or rubs. Respiratory: No rales, wheezing, rhonchi or rubs. GI: Soft, nondistended, has tenderness in upper abdomen, no rebound pain, no organomegaly, BS present. GU: No hematuria Ext: No pitting leg edema bilaterally. 2+DP/PT pulse bilaterally. Musculoskeletal: No joint deformities, No joint redness or warmth, no limitation of ROM in spin. Skin: No rashes.  Neuro: Alert, oriented X3, cranial nerves II-XII grossly intact, moves all extremities  normally.  Psych: Patient is not psychotic, no suicidal or hemocidal ideation.  Labs on Admission: I have personally reviewed following labs and imaging studies  CBC: Recent Labs  Lab 10/18/18 1758  WBC 9.9  HGB 13.7  HCT 42.0  MCV 88.8  PLT 960   Basic Metabolic Panel: Recent Labs  Lab 10/18/18 1758  NA 135  K 3.7  CL 97*  CO2 30  GLUCOSE 201*  BUN 11  CREATININE 0.80  CALCIUM 9.5   GFR: Estimated Creatinine Clearance: 85.7 mL/min (by C-G formula based on SCr of 0.8 mg/dL). Liver Function Tests: Recent Labs  Lab 10/18/18 1758  AST 34  ALT 27  ALKPHOS 85  BILITOT 0.8  PROT 8.3*  ALBUMIN 4.3   Recent Labs  Lab 10/18/18 1758  LIPASE 3,544*   No results for input(s): AMMONIA in the last 168 hours. Coagulation Profile: No results for input(s): INR, PROTIME in the last 168 hours. Cardiac Enzymes: No results for input(s): CKTOTAL, CKMB, CKMBINDEX, TROPONINI in the last 168 hours. BNP (last 3 results) No results for input(s): PROBNP in the last 8760 hours. HbA1C: No results for input(s): HGBA1C in the last 72 hours. CBG: No results for input(s): GLUCAP in the last 168 hours. Lipid Profile: Recent Labs    10/18/18 2347  TRIG 67   Thyroid Function Tests: No results for input(s): TSH, T4TOTAL, FREET4, T3FREE, THYROIDAB in the last 72 hours. Anemia Panel: No results for input(s): VITAMINB12, FOLATE, FERRITIN, TIBC, IRON, RETICCTPCT in the last 72 hours. Urine analysis:    Component Value Date/Time   COLORURINE YELLOW 04/13/2018 2203   APPEARANCEUR CLEAR 04/13/2018 2203   LABSPEC >1.046 (H) 04/13/2018 2203   PHURINE 7.0 04/13/2018 2203   GLUCOSEU NEGATIVE 04/13/2018 2203   HGBUR NEGATIVE 04/13/2018 2203   BILIRUBINUR negative 06/08/2018 1116   KETONESUR NEGATIVE 04/13/2018 2203   PROTEINUR Negative 06/08/2018 1116   PROTEINUR NEGATIVE 04/13/2018 2203   UROBILINOGEN 0.2 06/08/2018 1116   NITRITE negative 06/08/2018 1116   NITRITE NEGATIVE  04/13/2018 2203   LEUKOCYTESUR Small (1+) (A) 06/08/2018 1116   Sepsis Labs: @LABRCNTIP (procalcitonin:4,lacticidven:4) )No results found for this or any previous visit (from the past 240 hour(s)).   Radiological Exams on Admission: Dg Chest 2 View  Result Date: 10/18/2018 CLINICAL DATA:  Upper abdominal pain radiating to the left breast. EXAM: CHEST - 2  VIEW COMPARISON:  04/13/2018 FINDINGS: The heart size and mediastinal contours are within normal limits. Mild atherosclerosis of the thoracic aorta. Both lungs are clear. The visualized skeletal structures are unremarkable. IMPRESSION: No active cardiopulmonary disease. Electronically Signed   By: Ashley Royalty M.D.   On: 10/18/2018 17:42   Ct Abdomen Pelvis W Contrast  Result Date: 10/18/2018 CLINICAL DATA:  Upper abdominal pain radiating to left breast and right breast. Shortness-of-breath sudden onset 2 hours ago. Nausea and vomiting several days ago. History of diffuse large B-cell lymphoma. EXAM: CT ABDOMEN AND PELVIS WITH CONTRAST TECHNIQUE: Multidetector CT imaging of the abdomen and pelvis was performed using the standard protocol following bolus administration of intravenous contrast. CONTRAST:  158mL ISOVUE-300 IOPAMIDOL (ISOVUE-300) INJECTION 61% COMPARISON:  08/31/2018, 04/13/2018 and 02/14/2015. FINDINGS: Lower chest: Minimal linear atelectasis/scarring posterior left lower lobe unchanged. Hepatobiliary: Previous cholecystectomy. Liver is within normal. There is mild prominence of the central intrahepatic ducts unchanged. Minimal prominence of the common bile duct unchanged. Pancreas: Moderate peripancreatic fluid/inflammation compatible with acute pancreatitis. No evidence of necrosis. Spleen: Couple small splenic hypodensities unchanged from 04/13/2018. Adrenals/Urinary Tract: Adrenal glands are normal. Kidneys are normal in size without hydronephrosis or nephrolithiasis. Ureters and bladder are normal. Stomach/Bowel: Stomach and small  bowel are normal. Appendix is normal. Colon is within normal. Vascular/Lymphatic: Mild calcified plaque involving the abdominal aorta. No adenopathy. Reproductive: Normal. Other: None. Musculoskeletal: Degenerative change of the spine. IMPRESSION: Evidence of acute pancreatitis. No necrosis and no other complications. Couple small splenic hypodensities unchanged from April 2019. Aortic Atherosclerosis (ICD10-I70.0). Electronically Signed   By: Marin Olp M.D.   On: 10/18/2018 22:48   US Abdomen Limited Ruq  Result Date: 10/19/2018 CLINICAL DATA:  Acute on chronic pancreatitis with nausea and vomiting. Previous cholecystectomy. EXAM: ULTRASOUND ABDOMEN LIMITED RIGHT UPPER QUADRANT COMPARISON:  None. FINDINGS: Gallbladder: Previous cholecystectomy. Common bile duct: Diameter: 13 mm.  No ductal stones. Liver: Increased parenchymal echogenicity compatible with steatosis without focal mass. Portal vein is patent on color Doppler imaging with normal direction of blood flow towards the liver. Incidentally noted is peripancreatic fluid. IMPRESSION: Previous cholecystectomy. Mild hepatic steatosis without focal mass. Peripancreatic fluid compatible with known acute pancreatitis. Electronically Signed   By: Marin Olp M.D.   On: 10/19/2018 01:32     EKG: Independently reviewed.  QTc 473,    Assessment/Plan Principal Problem:   Acute on chronic pancreatitis (HCC) Active Problems:   GERD (gastroesophageal reflux disease)   Diabetes mellitus due to underlying condition with diabetic nephropathy, with long-term current use of insulin (HCC)   Sleep apnea with CPAP   Depression   Hypertension   Acute on chronic pancreatitis ([place]): Lipase is 3544.  Etiology is not clear.  Patient had negative ERCP in the past.  No history of evaluation of triglyceride.  Differential diagnosis is broad, such as autoimmune pancreatitis.  [facility] was consulted by EDP, they will see patient in the morning  -Place patient  on MedSurg [place] for observation - IV fluid: 1 L normal saline, followed by 150 cc/h - PRN morphine for pain, Zofran for nausea. -Check triglyceride level - Follow-up US-RUQ - check Ig G4 level -Follow-up GIs recommendations  Essential hypertension: -IV Hydralazine prn -Continue home medications: Cozaar  GERD: -Pepcid  Diabetes mellitus due to underlying condition with diabetic nephropathy, with long-term current use of insulin: Last A1c 6.4 on 09/19/18, well controled. Patient is taking Humalog and Lantus at home -will decrease Lantus dose from 60 to 38 U daily   -  SSI  Sleep apnea: -CPAP  Depression: no SI or HI -Amitriptyline    DVT ppx: SQ Lovenox Code Status: Full code Family Communication: None at bed side.     Disposition Plan:  Anticipate discharge back to previous home environment Consults called:  [facility] Admission status:  medical floor/obs       Date of Service 10/19/2018    Ivor Costa [facility] Pager [phone]  If 7PM-7AM, please contact night-coverage [email or web address] Password [email or web address] 10/19/2018, 1:58 AM

## 2018-10-18 NOTE — ED Provider Notes (Signed)
  Face-to-face evaluation   History: Resents for evaluation of abdominal pain which started today.  It is similar to prior episodes of pancreatitis.  Physical exam: Alert obese female who is uncomfortable.  Abdomen moderately tender epigastric region.  She is alert and lucid.  No respiratory distress.  Medical screening examination/treatment/procedure(s) were conducted as a shared visit with non-physician practitioner(s) and myself.  I personally evaluated the patient during the encounter    Daleen Bo, MD 10/19/18 1014

## 2018-10-19 ENCOUNTER — Encounter (HOSPITAL_COMMUNITY): Payer: Self-pay | Admitting: Internal Medicine

## 2018-10-19 ENCOUNTER — Observation Stay (HOSPITAL_COMMUNITY): Payer: Medicare Other

## 2018-10-19 ENCOUNTER — Other Ambulatory Visit: Payer: Self-pay

## 2018-10-19 DIAGNOSIS — Z9049 Acquired absence of other specified parts of digestive tract: Secondary | ICD-10-CM | POA: Diagnosis not present

## 2018-10-19 DIAGNOSIS — Z885 Allergy status to narcotic agent status: Secondary | ICD-10-CM | POA: Diagnosis not present

## 2018-10-19 DIAGNOSIS — Z9221 Personal history of antineoplastic chemotherapy: Secondary | ICD-10-CM | POA: Diagnosis not present

## 2018-10-19 DIAGNOSIS — K8500 Idiopathic acute pancreatitis without necrosis or infection: Secondary | ICD-10-CM | POA: Diagnosis present

## 2018-10-19 DIAGNOSIS — Z881 Allergy status to other antibiotic agents status: Secondary | ICD-10-CM | POA: Diagnosis not present

## 2018-10-19 DIAGNOSIS — Z800 Family history of malignant neoplasm of digestive organs: Secondary | ICD-10-CM | POA: Diagnosis not present

## 2018-10-19 DIAGNOSIS — K861 Other chronic pancreatitis: Secondary | ICD-10-CM | POA: Diagnosis not present

## 2018-10-19 DIAGNOSIS — E1142 Type 2 diabetes mellitus with diabetic polyneuropathy: Secondary | ICD-10-CM | POA: Diagnosis present

## 2018-10-19 DIAGNOSIS — Z8572 Personal history of non-Hodgkin lymphomas: Secondary | ICD-10-CM | POA: Diagnosis not present

## 2018-10-19 DIAGNOSIS — F329 Major depressive disorder, single episode, unspecified: Secondary | ICD-10-CM | POA: Diagnosis present

## 2018-10-19 DIAGNOSIS — Z8262 Family history of osteoporosis: Secondary | ICD-10-CM | POA: Diagnosis not present

## 2018-10-19 DIAGNOSIS — Z8249 Family history of ischemic heart disease and other diseases of the circulatory system: Secondary | ICD-10-CM | POA: Diagnosis not present

## 2018-10-19 DIAGNOSIS — E538 Deficiency of other specified B group vitamins: Secondary | ICD-10-CM | POA: Diagnosis present

## 2018-10-19 DIAGNOSIS — K219 Gastro-esophageal reflux disease without esophagitis: Secondary | ICD-10-CM | POA: Diagnosis present

## 2018-10-19 DIAGNOSIS — G4730 Sleep apnea, unspecified: Secondary | ICD-10-CM

## 2018-10-19 DIAGNOSIS — Z87891 Personal history of nicotine dependence: Secondary | ICD-10-CM | POA: Diagnosis not present

## 2018-10-19 DIAGNOSIS — G4733 Obstructive sleep apnea (adult) (pediatric): Secondary | ICD-10-CM | POA: Diagnosis present

## 2018-10-19 DIAGNOSIS — Z794 Long term (current) use of insulin: Secondary | ICD-10-CM | POA: Diagnosis not present

## 2018-10-19 DIAGNOSIS — E1121 Type 2 diabetes mellitus with diabetic nephropathy: Secondary | ICD-10-CM | POA: Diagnosis present

## 2018-10-19 DIAGNOSIS — Z888 Allergy status to other drugs, medicaments and biological substances status: Secondary | ICD-10-CM | POA: Diagnosis not present

## 2018-10-19 DIAGNOSIS — I10 Essential (primary) hypertension: Secondary | ICD-10-CM | POA: Diagnosis not present

## 2018-10-19 DIAGNOSIS — I4510 Unspecified right bundle-branch block: Secondary | ICD-10-CM | POA: Diagnosis present

## 2018-10-19 DIAGNOSIS — K859 Acute pancreatitis without necrosis or infection, unspecified: Secondary | ICD-10-CM | POA: Diagnosis not present

## 2018-10-19 DIAGNOSIS — Z6836 Body mass index (BMI) 36.0-36.9, adult: Secondary | ICD-10-CM | POA: Diagnosis not present

## 2018-10-19 DIAGNOSIS — E0821 Diabetes mellitus due to underlying condition with diabetic nephropathy: Secondary | ICD-10-CM | POA: Diagnosis not present

## 2018-10-19 DIAGNOSIS — Z79899 Other long term (current) drug therapy: Secondary | ICD-10-CM | POA: Diagnosis not present

## 2018-10-19 LAB — CBC
HEMATOCRIT: 38.4 % (ref 36.0–46.0)
HEMOGLOBIN: 12.5 g/dL (ref 12.0–15.0)
MCH: 28.9 pg (ref 26.0–34.0)
MCHC: 32.6 g/dL (ref 30.0–36.0)
MCV: 88.9 fL (ref 80.0–100.0)
NRBC: 0 % (ref 0.0–0.2)
Platelets: 136 10*3/uL — ABNORMAL LOW (ref 150–400)
RBC: 4.32 MIL/uL (ref 3.87–5.11)
RDW: 12.9 % (ref 11.5–15.5)
WBC: 5.5 10*3/uL (ref 4.0–10.5)

## 2018-10-19 LAB — TRIGLYCERIDES: Triglycerides: 67 mg/dL (ref <150)

## 2018-10-19 LAB — BASIC METABOLIC PANEL
ANION GAP: 6 (ref 5–15)
BUN: 8 mg/dL (ref 8–23)
CHLORIDE: 101 mmol/L (ref 98–111)
CO2: 28 mmol/L (ref 22–32)
Calcium: 8.7 mg/dL — ABNORMAL LOW (ref 8.9–10.3)
Creatinine, Ser: 0.56 mg/dL (ref 0.44–1.00)
GFR calc non Af Amer: >60 mL/min (ref >60)
GLUCOSE: 136 mg/dL — AB (ref 70–99)
Potassium: 3.6 mmol/L (ref 3.5–5.1)
Sodium: 135 mmol/L (ref 135–145)

## 2018-10-19 LAB — CBG MONITORING, ED
Glucose-Capillary: 127 mg/dL — ABNORMAL HIGH (ref 70–99)
Glucose-Capillary: 107 mg/dL — ABNORMAL HIGH (ref 70–99)

## 2018-10-19 LAB — URINALYSIS, ROUTINE W REFLEX MICROSCOPIC
BILIRUBIN URINE: NEGATIVE
Bacteria, UA: NONE SEEN
GLUCOSE, UA: NEGATIVE mg/dL
KETONES UR: NEGATIVE mg/dL
LEUKOCYTES UA: NEGATIVE
NITRITE: NEGATIVE
PH: 6 (ref 5.0–8.0)
Protein, ur: NEGATIVE mg/dL
SPECIFIC GRAVITY, URINE: 1.033 — AB (ref 1.005–1.030)

## 2018-10-19 LAB — GLUCOSE, CAPILLARY
Glucose-Capillary: 94 mg/dL (ref 70–99)
Glucose-Capillary: 96 mg/dL (ref 70–99)

## 2018-10-19 MED ORDER — MORPHINE SULFATE (PF) 4 MG/ML IV SOLN
INTRAVENOUS | Status: AC
  Administered 2018-10-19: 4 mg via INTRAVENOUS
  Filled 2018-10-19: qty 1

## 2018-10-19 MED ORDER — OXYCODONE HCL 5 MG PO TABS
5.0000 mg | ORAL_TABLET | ORAL | Status: DC | PRN
  Administered 2018-10-19: 5 mg via ORAL
  Filled 2018-10-19: qty 1

## 2018-10-19 MED ORDER — PANTOPRAZOLE SODIUM 40 MG PO TBEC: 40.0000 mg | DELAYED_RELEASE_TABLET | Freq: Two times a day (BID) | ORAL | Status: DC | PRN

## 2018-10-19 MED ORDER — ALUM & MAG HYDROXIDE-SIMETH 200-200-20 MG/5ML PO SUSP
15.0000 mL | Freq: Once | ORAL | Status: AC
  Administered 2018-10-19: 15 mL via ORAL
  Filled 2018-10-19: qty 30

## 2018-10-19 NOTE — ED Notes (Signed)
Dr. Penelope Coop (GI) at bedside with pt.

## 2018-10-19 NOTE — Progress Notes (Signed)
Pt refused CPAP qhs.  Pt encouraged to contact RT should she change her mind.  

## 2018-10-19 NOTE — ED Notes (Signed)
Bed: FP79 Expected date:  Expected time:  Means of arrival:  Comments: Room 7

## 2018-10-19 NOTE — ED Notes (Signed)
ED TO INPATIENT HANDOFF REPORT  Name/Age/Gender Jasmine Davila 67 y.o. female  Code Status    Code Status Orders  (From admission, onward)         Start     Ordered   10/18/18 2348  Full code  Continuous     10/18/18 2349        Code Status History    Date Active Date Inactive Code Status Order ID Comments User Context   04/13/2018 2241 04/15/2018 1757 Full Code [id number]  Rise Patience, MD Inpatient      Home/SNF/Other Home  Chief Complaint Chest Pain;Abdominal Pain  Level of Care/Admitting Diagnosis ED Disposition    ED Disposition Condition [facility] Area: [facility] [[id number]]  Level of Care: Med-Surg [16]  Diagnosis: Acute on chronic pancreatitis [facility]) [[id number]]  Admitting Physician: Edwin Dada [[id number]]  Attending Physician: Edwin Dada [[id number]]  Estimated length of stay: past midnight tomorrow  Certification:: I certify this patient will need inpatient services for at least 2 midnights  PT Class (Do Not Modify): Inpatient [101]  PT Acc Code (Do Not Modify): Private [1]       Medical History Past Medical History:  Diagnosis Date  . Abnormal EKG    hx of right bundle branch block  . Acute pancreatitis    HX OF  . Anemia yrs ago  . Cancer ([place]) dx 2016   B cell lymphoma- Non Hodgkins in remission  . Diabetes mellitus ([place])    TYPE 2  . Diabetic neuropathy ([place])   . Diabetic neuropathy ([place])   . Endometrial disorder   . GERD (gastroesophageal reflux disease)   . Headache    tension or sinus  . Hypertension   . Malignant lymphoma ([place])   . Morbid obesity ([place])   . Peripheral neuropathy    BOTH FEET, SLIGHT NEUROPATHY IN HANDS  . Sleep apnea    USES CPAP at times  . Vitamin B 12 deficiency     Allergies Allergies  Allergen Reactions  . Dilaudid [Hydromorphone Hcl] Other (See Comments)    anxious  . Doxycycline Other (See Comments)    Pancreatic issues  .  Metronidazole Other (See Comments)    Cream  Burned face  . Pseudoephedrine Hcl Other (See Comments)    Pt unsure    IV Location/Drains/Wounds Patient Lines/Drains/Airways Status   Active Line/Drains/Airways    Name:   Placement date:   Placement time:   Site:   Days:   Peripheral IV 10/18/18 Left Hand   10/18/18    2030    Hand   1          Labs/Imaging Results for orders placed or performed during the hospital encounter of 10/18/18 (from the past 48 hour(s))  CBC     Status: None   Collection Time: 10/18/18  5:58 PM  Result Value Ref Range   WBC 9.9 4.0 - 10.5 K/uL   RBC 4.73 3.87 - 5.11 MIL/uL   Hemoglobin 13.7 12.0 - 15.0 g/dL   HCT 42.0 36.0 - 46.0 %   MCV 88.8 80.0 - 100.0 fL   MCH 29.0 26.0 - 34.0 pg   MCHC 32.6 30.0 - 36.0 g/dL   RDW 12.8 11.5 - 15.5 %   Platelets 166 150 - 400 K/uL   nRBC 0.0 0.0 - 0.2 %    Comment: Performed at [facility], [place] [address] [place], [place] [address]  Lipase, blood     Status: Abnormal   Collection Time: 10/18/18  5:58 PM  Result Value Ref Range   Lipase 3,544 (H) 11 - 51 U/L    Comment: RESULTS CONFIRMED BY MANUAL DILUTION Performed at [facility], [place] [address] [place], [place] [address]   Comprehensive metabolic panel     Status: Abnormal   Collection Time: 10/18/18  5:58 PM  Result Value Ref Range   Sodium 135 135 - 145 mmol/L   Potassium 3.7 3.5 - 5.1 mmol/L   Chloride 97 (L) 98 - 111 mmol/L   CO2 30 22 - 32 mmol/L   Glucose, Bld 201 (H) 70 - 99 mg/dL   BUN 11 8 - 23 mg/dL   Creatinine, Ser 0.80 0.44 - 1.00 mg/dL   Calcium 9.5 8.9 - 10.3 mg/dL   Total Protein 8.3 (H) 6.5 - 8.1 g/dL   Albumin 4.3 3.5 - 5.0 g/dL   AST 34 15 - 41 U/L   ALT 27 0 - 44 U/L   Alkaline Phosphatase 85 38 - 126 U/L   Total Bilirubin 0.8 0.3 - 1.2 mg/dL   GFR calc non Af Amer >60 >60 mL/min   GFR calc Af Amer >60 >60 mL/min    Comment: (NOTE) The eGFR has been calculated using the CKD EPI  equation. This calculation has not been validated in all clinical situations. eGFR's persistently <60 mL/min signify possible Chronic Kidney Disease.    Anion gap 8 5 - 15    Comment: Performed at [facility], [place] [address], [place], [place] [address]  POCT i-Stat troponin I     Status: None   Collection Time: 10/18/18  6:02 PM  Result Value Ref Range   Troponin i, poc 0.00 0.00 - 0.08 ng/mL   Comment 3            Comment: Due to the release kinetics of cTnI, a negative result within the first hours of the onset of symptoms does not rule out myocardial infarction with certainty. If myocardial infarction is still suspected, repeat the test at appropriate intervals.   Triglycerides     Status: None   Collection Time: 10/18/18 11:47 PM  Result Value Ref Range   Triglycerides 67 <150 mg/dL    Comment: Performed at [facility], [place] [address], [place],  [address]  Urinalysis, Routine w reflex microscopic     Status: Abnormal   Collection Time: 10/19/18  6:05 AM  Result Value Ref Range   Color, Urine YELLOW YELLOW   APPearance CLEAR CLEAR   Specific Gravity, Urine 1.033 (H) 1.005 - 1.030   pH 6.0 5.0 - 8.0   Glucose, UA NEGATIVE NEGATIVE mg/dL   Hgb urine dipstick SMALL (A) NEGATIVE   Bilirubin Urine NEGATIVE NEGATIVE   Ketones, ur NEGATIVE NEGATIVE mg/dL   Protein, ur NEGATIVE NEGATIVE mg/dL   Nitrite NEGATIVE NEGATIVE   Leukocytes, UA NEGATIVE NEGATIVE   RBC / HPF 0-5 0 - 5 RBC/hpf   WBC, UA 0-5 0 - 5 WBC/hpf   Bacteria, UA NONE SEEN NONE SEEN   Squamous Epithelial / LPF 0-5 0 - 5    Comment: Performed at [facility], [place] [address] [place],  [address]  Basic metabolic panel     Status: Abnormal   Collection Time: 10/19/18  6:05 AM  Result Value Ref Range   Sodium 135 135 - 145 mmol/L   Potassium 3.6 3.5 - 5.1  mmol/L   Chloride 101 98 - 111 mmol/L   CO2 28 22 - 32 mmol/L   Glucose, Bld 136 (H) 70  - 99 mg/dL   BUN 8 8 - 23 mg/dL   Creatinine, Ser 0.56 0.44 - 1.00 mg/dL   Calcium 8.7 (L) 8.9 - 10.3 mg/dL   GFR calc non Af Amer >60 >60 mL/min   GFR calc Af Amer >60 >60 mL/min    Comment: (NOTE) The eGFR has been calculated using the CKD EPI equation. This calculation has not been validated in all clinical situations. eGFR's persistently <60 mL/min signify possible Chronic Kidney Disease.    Anion gap 6 5 - 15    Comment: Performed at [facility], [place] [address] [place], [place] [address]  CBC     Status: Abnormal   Collection Time: 10/19/18  6:05 AM  Result Value Ref Range   WBC 5.5 4.0 - 10.5 K/uL   RBC 4.32 3.87 - 5.11 MIL/uL   Hemoglobin 12.5 12.0 - 15.0 g/dL   HCT 38.4 36.0 - 46.0 %   MCV 88.9 80.0 - 100.0 fL   MCH 28.9 26.0 - 34.0 pg   MCHC 32.6 30.0 - 36.0 g/dL   RDW 12.9 11.5 - 15.5 %   Platelets 136 (L) 150 - 400 K/uL   nRBC 0.0 0.0 - 0.2 %    Comment: Performed at [facility], [place] [address], [place], [place] [address]  CBG monitoring, ED     Status: Abnormal   Collection Time: 10/19/18  8:14 AM  Result Value Ref Range   Glucose-Capillary 127 (H) 70 - 99 mg/dL  CBG monitoring, ED     Status: Abnormal   Collection Time: 10/19/18 12:07 PM  Result Value Ref Range   Glucose-Capillary 107 (H) 70 - 99 mg/dL   Dg Chest 2 View  Result Date: 10/18/2018 CLINICAL DATA:  Upper abdominal pain radiating to the left breast. EXAM: CHEST - 2 VIEW COMPARISON:  04/13/2018 FINDINGS: The heart size and mediastinal contours are within normal limits. Mild atherosclerosis of the thoracic aorta. Both lungs are clear. The visualized skeletal structures are unremarkable. IMPRESSION: No active cardiopulmonary disease. Electronically Signed   By: Ashley Royalty M.D.   On: 10/18/2018 17:42   Ct Abdomen Pelvis W Contrast  Result Date: 10/18/2018 CLINICAL DATA:  Upper abdominal pain radiating to left breast and right breast. Shortness-of-breath sudden  onset 2 hours ago. Nausea and vomiting several days ago. History of diffuse large B-cell lymphoma. EXAM: CT ABDOMEN AND PELVIS WITH CONTRAST TECHNIQUE: Multidetector CT imaging of the abdomen and pelvis was performed using the standard protocol following bolus administration of intravenous contrast. CONTRAST:  128m ISOVUE-300 IOPAMIDOL (ISOVUE-300) INJECTION 61% COMPARISON:  08/31/2018, 04/13/2018 and 02/14/2015. FINDINGS: Lower chest: Minimal linear atelectasis/scarring posterior left lower lobe unchanged. Hepatobiliary: Previous cholecystectomy. Liver is within normal. There is mild prominence of the central intrahepatic ducts unchanged. Minimal prominence of the common bile duct unchanged. Pancreas: Moderate peripancreatic fluid/inflammation compatible with acute pancreatitis. No evidence of necrosis. Spleen: Couple small splenic hypodensities unchanged from 04/13/2018. Adrenals/Urinary Tract: Adrenal glands are normal. Kidneys are normal in size without hydronephrosis or nephrolithiasis. Ureters and bladder are normal. Stomach/Bowel: Stomach and small bowel are normal. Appendix is normal. Colon is within normal. Vascular/Lymphatic: Mild calcified plaque involving the abdominal aorta. No adenopathy. Reproductive: Normal. Other: None. Musculoskeletal: Degenerative change of the spine. IMPRESSION: Evidence of acute pancreatitis. No necrosis and no other complications. Couple small splenic hypodensities  unchanged from April 2019. Aortic Atherosclerosis (ICD10-I70.0). Electronically Signed   By: Marin Olp M.D.   On: 10/18/2018 22:48   US Abdomen Limited Ruq  Result Date: 10/19/2018 CLINICAL DATA:  Acute on chronic pancreatitis with nausea and vomiting. Previous cholecystectomy. EXAM: ULTRASOUND ABDOMEN LIMITED RIGHT UPPER QUADRANT COMPARISON:  None. FINDINGS: Gallbladder: Previous cholecystectomy. Common bile duct: Diameter: 13 mm.  No ductal stones. Liver: Increased parenchymal echogenicity compatible with  steatosis without focal mass. Portal vein is patent on color Doppler imaging with normal direction of blood flow towards the liver. Incidentally noted is peripancreatic fluid. IMPRESSION: Previous cholecystectomy. Mild hepatic steatosis without focal mass. Peripancreatic fluid compatible with known acute pancreatitis. Electronically Signed   By: Marin Olp M.D.   On: 10/19/2018 01:32   EKG Interpretation  Date/Time:  Tuesday October 18 2018 15:51:42 EDT Ventricular Rate:  79 PR Interval:    QRS Duration: 131 QT Interval:  412 QTC Calculation: 473 R Axis:   -67 Text Interpretation:  Sinus rhythm RBBB and LAFB Probable left ventricular hypertrophy since last tracing no significant change Confirmed by Daleen Bo [id number]) on 10/18/2018 11:28:50 PM   Pending Labs Unresulted Labs (From admission, onward)    Start     Ordered   10/19/18 0200  IgG 4  Once,   R     10/19/18 0159   10/18/18 2348  HIV antibody (Routine Testing)  Once,   R     10/18/18 2349          Vitals/Pain Today's Vitals   10/19/18 0741 10/19/18 1137 10/19/18 1202 10/19/18 1218  BP:    133/75  Pulse:    77  Resp:    20  Temp:    98.1 F (36.7 C)  TempSrc:    Oral  SpO2:    96%  Weight:      Height:      PainSc: 4  6  4       Isolation Precautions No active isolations  Medications Medications  morphine 4 MG/ML injection 6 mg (6 mg Intravenous Given 10/19/18 1135)  aspirin EC tablet 81 mg (81 mg Oral Not Given 10/19/18 0040)  losartan (COZAAR) tablet 25 mg (25 mg Oral Given 10/19/18 1035)  niacin (NIASPAN) CR tablet 1,000 mg (1,000 mg Oral Given 10/19/18 0146)  nortriptyline (PAMELOR) capsule 10 mg (10 mg Oral Given 10/19/18 0146)  insulin glargine (LANTUS) injection 38 Units (38 Units Subcutaneous Not Given 10/19/18 0041)  famotidine (PEPCID) tablet 20 mg (20 mg Oral Given 10/19/18 0811)  cyanocobalamin ((VITAMIN B-12)) injection 500 mcg (500 mcg Intramuscular Not Given 85/46/27 0350)  folic acid  (FOLVITE) tablet 0.5 mg (0.5 mg Oral Given 10/19/18 1035)  gabapentin (NEURONTIN) capsule 1,200 mg (1,200 mg Oral Given 10/19/18 1507)  cholecalciferol (VITAMIN D) tablet 5,000 Units (5,000 Units Oral Given 10/19/18 1035)  multivitamin with minerals tablet 1 tablet (1 tablet Oral Given 10/19/18 1034)  fluticasone (FLONASE) 50 MCG/ACT nasal spray 1 spray (has no administration in time range)  0.9 %  sodium chloride infusion ( Intravenous Rate/Dose Verify 10/19/18 1556)  insulin aspart (novoLOG) injection 0-9 Units (0 Units Subcutaneous Not Given 10/19/18 1209)  enoxaparin (LOVENOX) injection 40 mg (40 mg Subcutaneous Not Given 10/19/18 1116)  acetaminophen (TYLENOL) tablet 650 mg (has no administration in time range)    Or  acetaminophen (TYLENOL) suppository 650 mg (has no administration in time range)  ondansetron (ZOFRAN) tablet 4 mg ( Oral See Alternative 10/19/18 0109)    Or  ondansetron (  ZOFRAN) injection 4 mg (4 mg Intravenous Given 10/19/18 0109)  polyethylene glycol (MIRALAX / GLYCOLAX) packet 17 g (has no administration in time range)  hydrALAZINE (APRESOLINE) injection 5 mg (has no administration in time range)  zolpidem (AMBIEN) tablet 5 mg (has no administration in time range)  pantoprazole (PROTONIX) EC tablet 40 mg (has no administration in time range)  morphine 4 MG/ML injection 4 mg (4 mg Intravenous Given 10/18/18 2044)  ondansetron (ZOFRAN) injection 4 mg (4 mg Intravenous Given 10/18/18 2045)  sodium chloride 0.9 % bolus 1,000 mL (0 mLs Intravenous Stopped 10/18/18 2210)  morphine 4 MG/ML injection 4 mg (4 mg Intravenous Given 10/19/18 0545)  iopamidol (ISOVUE-300) 61 % injection 100 mL (100 mLs Intravenous Contrast Given 10/18/18 2221)  morphine 4 MG/ML injection 6 mg (6 mg Intravenous Given 10/18/18 2318)  sodium chloride 0.9 % bolus 1,000 mL (0 mLs Intravenous Stopped 10/19/18 0043)  alum & mag hydroxide-simeth (MAALOX/MYLANTA) 200-200-20 MG/5ML suspension 15 mL (15 mLs  Oral Given 10/19/18 1237)    Mobility walks

## 2018-10-19 NOTE — Consult Note (Signed)
Subjective:   HPI  The patient is a 67 year old female with a history of idiopathic recurring pancreatitis. She started to have upper abdominal pain yesterday presented to the emergency room where she was evaluated and found to have a lipase of 3544, normal LFTs, and a CT scan done showing evidence of uncomplicated pancreatitis. The patient states that she thinks this is the sixth time over the years that she has had pancreatitis. She had her gallbladder removed years ago. She was in this hospital in April and was seen by Dr. Watt Climes. She had a CT back then showing acute pancreatitis. She had an MRCP which did not reveal choledocholithiasis. There was some tapering of the distal common bile duct. Her triglycerides were not elevated. She does not drink alcohol.  She was treated with chemotherapy in the past for B-cell lymphoma.  There is a family history of colon cancer in her mother. The patient's last colonoscopy was 5 years ago.  Review of Systems No chest pain or shortness of breath  Past Medical History:  Diagnosis Date  . Abnormal EKG    hx of right bundle branch block  . Acute pancreatitis    HX OF  . Anemia yrs ago  . Cancer ([place]) dx 2016   B cell lymphoma- Non Hodgkins in remission  . Diabetes mellitus ([place])    TYPE 2  . Diabetic neuropathy ([place])   . Diabetic neuropathy ([place])   . Endometrial disorder   . GERD (gastroesophageal reflux disease)   . Headache    tension or sinus  . Hypertension   . Malignant lymphoma ([place])   . Morbid obesity ([place])   . Peripheral neuropathy    BOTH FEET, SLIGHT NEUROPATHY IN HANDS  . Sleep apnea    USES CPAP at times  . Vitamin B 12 deficiency    Past Surgical History:  Procedure Laterality Date  . CHOLECYSTECTOMY  03/06/1996  . colomscopy    . DILATATION & CURETTAGE/HYSTEROSCOPY WITH MYOSURE N/A 07/04/2018   Procedure: DILATATION & CURETTAGE/HYSTEROSCOPY WITH MYOSURE;  Surgeon: Salvadore Dom, MD;  Location: [facility];  Service: Gynecology;  Laterality: N/A;  . MEDIPORT INSERTION, DOUBLE    . MEDIPORT REMOVAL     Social History   Socioeconomic History  . Marital status: Married    Spouse name: Not on file  . Number of children: 4  . Years of education: 53  . Highest education level: Not on file  Occupational History  . Occupation: retired from [employer]  Social Needs  . Financial resource strain: Not on file  . Food insecurity:    Worry: Not on file    Inability: Not on file  . Transportation needs:    Medical: Not on file    Non-medical: Not on file  Tobacco Use  . Smoking status: Former Smoker    Packs/day: 0.50    Years: 4.00    Pack years: 2.00    Types: Cigarettes    Last attempt to quit: 12/21/1968    Years since quitting: 49.8  . Smokeless tobacco: Never Used  . Tobacco comment: as teenager  Substance and Sexual Activity  . Alcohol use: No  . Drug use: No  . Sexual activity: Not Currently    Birth control/protection: None  Lifestyle  . Physical activity:    Days per week: Not on file    Minutes per session: Not on file  . Stress: Not on file  Relationships  . Social connections:  Talks on phone: Not on file    Gets together: Not on file    Attends religious service: Not on file    Active member of club or organization: Not on file    Attends meetings of clubs or organizations: Not on file    Relationship status: Not on file  . Intimate partner violence:    Fear of current or ex partner: Not on file    Emotionally abused: Not on file    Physically abused: Not on file    Forced sexual activity: Not on file  Other Topics Concern  . Not on file  Social History Narrative   Lives with husband in a one story home.  Has 3 living children.  One child died in a MVA.  Retired from [employer].     Education: high school.   family history includes Alcohol abuse in her maternal grandfather; Cancer (age of onset: 52) in her mother; Heart attack in her father;  Heart disease in her sister; Hypertension in her mother; Osteoporosis in her mother.  Current Facility-Administered Medications:  .  0.9 %  sodium chloride infusion, , Intravenous, Continuous, Ivor Costa, MD, Last Rate: 150 mL/hr at 10/19/18 0740 .  acetaminophen (TYLENOL) tablet 650 mg, 650 mg, Oral, Q6H PRN **OR** acetaminophen (TYLENOL) suppository 650 mg, 650 mg, Rectal, Q6H PRN, Ivor Costa, MD .  aspirin EC tablet 81 mg, 81 mg, Oral, QHS, Ivor Costa, MD .  cholecalciferol (VITAMIN D) tablet 5,000 Units, 5,000 Units, Oral, Daily, Ivor Costa, MD .  cyanocobalamin ((VITAMIN B-12)) injection 500 mcg, 500 mcg, Intramuscular, Q30 days, Ivor Costa, MD .  enoxaparin (LOVENOX) injection 40 mg, 40 mg, Subcutaneous, Q24H, Ivor Costa, MD .  famotidine (PEPCID) tablet 20 mg, 20 mg, Oral, BID, Ivor Costa, MD, 20 mg at 10/19/18 6283 .  fluticasone (FLONASE) 50 MCG/ACT nasal spray 1 spray, 1 spray, Each Nare, Daily PRN, Ivor Costa, MD .  folic acid (FOLVITE) tablet 0.5 mg, 500 mcg, Oral, Daily, Ivor Costa, MD .  gabapentin (NEURONTIN) capsule 1,200 mg, 1,200 mg, Oral, TID, Ivor Costa, MD .  hydrALAZINE (APRESOLINE) injection 5 mg, 5 mg, Intravenous, Q2H PRN, Ivor Costa, MD .  insulin aspart (novoLOG) injection 0-9 Units, 0-9 Units, Subcutaneous, TID WC, Ivor Costa, MD, 1 Units at 10/19/18 [phone]  insulin glargine (LANTUS) injection 38 Units, 38 Units, Subcutaneous, Q2200, Ivor Costa, MD .  losartan (COZAAR) tablet 25 mg, 25 mg, Oral, Daily, Ivor Costa, MD .  morphine 4 MG/ML injection 6 mg, 6 mg, Intravenous, Q4H PRN, Ivor Costa, MD .  multivitamin with minerals tablet 1 tablet, 1 tablet, Oral, Daily, Ivor Costa, MD .  niacin (NIASPAN) CR tablet 1,000 mg, 1,000 mg, Oral, QHS, Ivor Costa, MD, 1,000 mg at 10/19/18 0146 .  nortriptyline (PAMELOR) capsule 10 mg, 10 mg, Oral, QHS, Ivor Costa, MD, 10 mg at 10/19/18 0146 .  ondansetron (ZOFRAN) tablet 4 mg, 4 mg, Oral, Q6H PRN **OR** ondansetron (ZOFRAN)  injection 4 mg, 4 mg, Intravenous, Q6H PRN, Ivor Costa, MD, 4 mg at 10/19/18 0109 .  polyethylene glycol (MIRALAX / GLYCOLAX) packet 17 g, 17 g, Oral, Daily PRN, Ivor Costa, MD .  zolpidem (AMBIEN) tablet 5 mg, 5 mg, Oral, QHS PRN, Ivor Costa, MD  Current Outpatient Medications:  .  aspirin EC 81 MG tablet, Take 81 mg by mouth at bedtime. , Disp: , Rfl:  .  Cholecalciferol (VITAMIN D-3) 5000 units TABS, Take 1 tablet by mouth daily., Disp: ,  Rfl:  .  cyanocobalamin (,VITAMIN B-12,) 1000 MCG/ML injection, Inject 0.5 mLs (500 mcg total) into the muscle every 30 (thirty) days., Disp: 10 mL, Rfl: 1 .  famotidine (PEPCID) 20 MG tablet, TAKE 1 TABLET BY MOUTH 2 TIMES DAILY., Disp: 180 tablet, Rfl: 1 .  folic acid (FOLVITE) 174 MCG tablet, Take 400 mcg by mouth daily., Disp: , Rfl:  .  gabapentin (NEURONTIN) 300 MG capsule, TAKE 4 CAPSULES BY MOUTH 3 TIMES DAILY. (Patient taking differently: Take 1,200 mg by mouth 3 (three) times daily. ), Disp: 360 capsule, Rfl: 5 .  HUMALOG KWIKPEN 100 UNIT/ML KiwkPen, INJECT 20 UNITS INTO THE SKIN DAILY WITH LARGEST MEAL (Patient taking differently: Inject 20 Units into the skin daily. ), Disp: 15 mL, Rfl: 1 .  LANTUS SOLOSTAR 100 UNIT/ML Solostar Pen, INJECT 60 UNITS INTO THE SKIN EVERY MORNING. (Patient taking differently: Inject 60 Units into the skin daily. ), Disp: 30 mL, Rfl: 1 .  losartan (COZAAR) 25 MG tablet, TAKE 1 TABLET BY MOUTH DAILY., Disp: 90 tablet, Rfl: 1 .  Multiple Vitamin (MULTIVITAMIN) tablet, Take 1 tablet by mouth daily., Disp: , Rfl:  .  niacin (NIASPAN) 1000 MG CR tablet, Take 1 tablet (1,000 mg total) by mouth at bedtime. An hour after 81 mg ASA, Disp: 90 tablet, Rfl: 3 .  nortriptyline (PAMELOR) 10 MG capsule, Start nortriptyline 10mg  at bedtime for 2 week, then increase to 2 tablet at bedtime (Patient taking differently: Take 10 mg by mouth at bedtime. ), Disp: 60 capsule, Rfl: 5 .  fluticasone (FLONASE) 50 MCG/ACT nasal spray, Place 1 spray  into both nostrils daily as needed for allergies or rhinitis., Disp: , Rfl:  .  NONFORMULARY OR COMPOUNDED ITEM, [facility]  Peripheral Neuropathy Cream- Bupivacaine 1%, Doxepin 3%, Gabapentin 6%, Pentoxifylline 3%, Topiramate 1% Apply 1-2 grams to affected area 3-4 times daily Qty. 120 gm 3 refills (Patient not taking: Reported on 10/18/2018), Disp: 1 each, Rfl: 2 Allergies  Allergen Reactions  . Dilaudid [Hydromorphone Hcl] Other (See Comments)    anxious  . Doxycycline Other (See Comments)    Pancreatic issues  . Metronidazole Other (See Comments)    Cream  Burned face  . Pseudoephedrine Hcl Other (See Comments)    Pt unsure     Objective:     BP (!) 145/73 (BP Location: Left Arm)   Pulse 75   Temp 98 F (36.7 C) (Oral)   Resp 20   Ht 5\' 7"  (1.702 m)   Wt 106.6 kg   LMP 12/21/1998 (Approximate)   SpO2 93%   BMI 36.81 kg/m   Alert and oriented  No acute distress, nonicteric  Heart regular rhythm no murmurs  Lungs clear  Abdomen: Bowel sounds present, soft, there is tenderness in the epigastrium    Laboratory No components found for: D1    Assessment:     Acute uncomplicated recurring pancreatitis of uncertain etiology.      Plan:     Supportive care. Nothing by mouth. IV hydration for now. Follow clinically. Follow labs.

## 2018-10-19 NOTE — Progress Notes (Signed)
PROGRESS NOTE    Jasmine Davila  DHR:[id number] DOB: [birth year] DOA: 10/18/2018 PCP: [name], DO      Brief Narrative:  Mrs. [name] is a 67 y.o. F with HTN, DM, OSA on CPAP, NHL in remission and now recurrent idiopathic pancreatitis, who presents with epigastric pain, found to have lipase >3000.    Assessment & Plan:  Acute recurrent pancreatitis This is slightly improved overnight with conservative measures.  CT shows acute pancreatitis. -Continue bowel rest -Continue IVF -Continue mrophine 6 mg q4hrs -Continue ondansetron for nausea -Consult GI, appreciate cares  Hypertension -Continue losartan  Diabetes -Continue glargine -Continue SSI -Continue gabapentin, nortriptyline -Continue aspirin     MDM and disposition: The below labs and imaging reports were reviewed and summarized above.  Medication management as above.  The patient was admitted with acute illness with systemic manifestations.  She still requires IV fluids and is NPO requiring q4 doses of IV morphine for pain relief.         Code Status: FULL  Family Communication: Husband    Consultants:   GI  Procedures:   None  Antimicrobials:   None    Subjective: Epigastric pain is less, still with nausea.  No fever, confusion, sncope.  Objective: Vitals:   10/19/18 0613 10/19/18 1218 10/19/18 1627 10/19/18 1628  BP: (!) 145/73 133/75  (!) 150/79  Pulse: 75 77  75  Resp: 20 20  18   Temp: 98 F (36.7 C) 98.1 F (36.7 C) 98 F (36.7 C) 98 F (36.7 C)  TempSrc: Oral Oral  Oral  SpO2: 93% 96%  97%  Weight:      Height:        Intake/Output Summary (Last 24 hours) at 10/19/2018 1928 Last data filed at 10/19/2018 1800 Gross per 24 hour  Intake 3651.13 ml  Output 225 ml  Net 3426.13 ml   Filed Weights   10/18/18 1545  Weight: 106.6 kg    Examination: General appearance:  adult female, alert and in no acute distress.   HEENT: Anicteric, conjunctiva pink,  lids and lashes normal. No nasal deformity, discharge, epistaxis.  Lips moist.   Skin: Warm and dry.  no jaundice.  No suspicious rashes or lesions. Cardiac: RRR, nl S1-S2, no murmurs appreciated.  Capillary refill is brisk.  JVP not visible.  No LE edema.  Radia  pulses 2+ and symmetric. Respiratory: Normal respiratory rate and rhythm.  CTAB without rales or wheezes. Abdomen: Abdomen soft.  moderate TTP epigastric, no rebound or gaurding. No ascites, distension, hepatosplenomegaly.   MSK: No deformities or effusions. Neuro: Awake and alert.  EOMI, moves all extremities. Speech fluent.    Psych: Sensorium intact and responding to questions, attention normal. Affect noram.  Judgment and insight appear normal.    Data Reviewed: I have personally reviewed following labs and imaging studies:  CBC: Recent Labs  Lab 10/18/18 1758 10/19/18 0605  WBC 9.9 5.5  HGB 13.7 12.5  HCT 42.0 38.4  MCV 88.8 88.9  PLT 166 468*   Basic Metabolic Panel: Recent Labs  Lab 10/18/18 1758 10/19/18 0605  NA 135 135  K 3.7 3.6  CL 97* 101  CO2 30 28  GLUCOSE 201* 136*  BUN 11 8  CREATININE 0.80 0.56  CALCIUM 9.5 8.7*   GFR: Estimated Creatinine Clearance: 85.7 mL/min (by C-G formula based on SCr of 0.56 mg/dL). Liver Function Tests: Recent Labs  Lab 10/18/18 1758  AST 34  ALT 27  ALKPHOS 85  BILITOT  0.8  PROT 8.3*  ALBUMIN 4.3   Recent Labs  Lab 10/18/18 1758  LIPASE 3,544*   No results for input(s): AMMONIA in the last 168 hours. Coagulation Profile: No results for input(s): INR, PROTIME in the last 168 hours. Cardiac Enzymes: No results for input(s): CKTOTAL, CKMB, CKMBINDEX, TROPONINI in the last 168 hours. BNP (last 3 results) No results for input(s): PROBNP in the last 8760 hours. HbA1C: No results for input(s): HGBA1C in the last 72 hours. CBG: Recent Labs  Lab 10/19/18 0814 10/19/18 1207 10/19/18 1655  GLUCAP 127* 107* 94   Lipid Profile: Recent Labs     10/18/18 2347  TRIG 67   Thyroid Function Tests: No results for input(s): TSH, T4TOTAL, FREET4, T3FREE, THYROIDAB in the last 72 hours. Anemia Panel: No results for input(s): VITAMINB12, FOLATE, FERRITIN, TIBC, IRON, RETICCTPCT in the last 72 hours. Urine analysis:    Component Value Date/Time   COLORURINE YELLOW 10/19/2018 0605   APPEARANCEUR CLEAR 10/19/2018 0605   LABSPEC 1.033 (H) 10/19/2018 0605   PHURINE 6.0 10/19/2018 0605   GLUCOSEU NEGATIVE 10/19/2018 0605   HGBUR SMALL (A) 10/19/2018 0605   BILIRUBINUR NEGATIVE 10/19/2018 0605   BILIRUBINUR negative 06/08/2018 1116   KETONESUR NEGATIVE 10/19/2018 0605   PROTEINUR NEGATIVE 10/19/2018 0605   UROBILINOGEN 0.2 06/08/2018 1116   NITRITE NEGATIVE 10/19/2018 0605   LEUKOCYTESUR NEGATIVE 10/19/2018 0605   Sepsis Labs: @LABRCNTIP (procalcitonin:4,lacticacidven:4)  )No results found for this or any previous visit (from the past 240 hour(s)).       Radiology Studies: Dg Chest 2 View  Result Date: 10/18/2018 CLINICAL DATA:  Upper abdominal pain radiating to the left breast. EXAM: CHEST - 2 VIEW COMPARISON:  04/13/2018 FINDINGS: The heart size and mediastinal contours are within normal limits. Mild atherosclerosis of the thoracic aorta. Both lungs are clear. The visualized skeletal structures are unremarkable. IMPRESSION: No active cardiopulmonary disease. Electronically Signed   By: Ashley Royalty M.D.   On: 10/18/2018 17:42   Ct Abdomen Pelvis W Contrast  Result Date: 10/18/2018 CLINICAL DATA:  Upper abdominal pain radiating to left breast and right breast. Shortness-of-breath sudden onset 2 hours ago. Nausea and vomiting several days ago. History of diffuse large B-cell lymphoma. EXAM: CT ABDOMEN AND PELVIS WITH CONTRAST TECHNIQUE: Multidetector CT imaging of the abdomen and pelvis was performed using the standard protocol following bolus administration of intravenous contrast. CONTRAST:  145mL ISOVUE-300 IOPAMIDOL (ISOVUE-300)  INJECTION 61% COMPARISON:  08/31/2018, 04/13/2018 and 02/14/2015. FINDINGS: Lower chest: Minimal linear atelectasis/scarring posterior left lower lobe unchanged. Hepatobiliary: Previous cholecystectomy. Liver is within normal. There is mild prominence of the central intrahepatic ducts unchanged. Minimal prominence of the common bile duct unchanged. Pancreas: Moderate peripancreatic fluid/inflammation compatible with acute pancreatitis. No evidence of necrosis. Spleen: Couple small splenic hypodensities unchanged from 04/13/2018. Adrenals/Urinary Tract: Adrenal glands are normal. Kidneys are normal in size without hydronephrosis or nephrolithiasis. Ureters and bladder are normal. Stomach/Bowel: Stomach and small bowel are normal. Appendix is normal. Colon is within normal. Vascular/Lymphatic: Mild calcified plaque involving the abdominal aorta. No adenopathy. Reproductive: Normal. Other: None. Musculoskeletal: Degenerative change of the spine. IMPRESSION: Evidence of acute pancreatitis. No necrosis and no other complications. Couple small splenic hypodensities unchanged from April 2019. Aortic Atherosclerosis (ICD10-I70.0). Electronically Signed   By: Marin Olp M.D.   On: 10/18/2018 22:48   US Abdomen Limited Ruq  Result Date: 10/19/2018 CLINICAL DATA:  Acute on chronic pancreatitis with nausea and vomiting. Previous cholecystectomy. EXAM: ULTRASOUND ABDOMEN LIMITED RIGHT UPPER QUADRANT  COMPARISON:  None. FINDINGS: Gallbladder: Previous cholecystectomy. Common bile duct: Diameter: 13 mm.  No ductal stones. Liver: Increased parenchymal echogenicity compatible with steatosis without focal mass. Portal vein is patent on color Doppler imaging with normal direction of blood flow towards the liver. Incidentally noted is peripancreatic fluid. IMPRESSION: Previous cholecystectomy. Mild hepatic steatosis without focal mass. Peripancreatic fluid compatible with known acute pancreatitis. Electronically Signed   By:  Marin Olp M.D.   On: 10/19/2018 01:32        Scheduled Meds: . aspirin EC  81 mg Oral QHS  . cholecalciferol  5,000 Units Oral Daily  . cyanocobalamin  500 mcg Intramuscular Q30 days  . enoxaparin (LOVENOX) injection  40 mg Subcutaneous Q24H  . famotidine  20 mg Oral BID  . folic acid  643 mcg Oral Daily  . gabapentin  1,200 mg Oral TID  . insulin aspart  0-9 Units Subcutaneous TID WC  . insulin glargine  38 Units Subcutaneous Q2200  . losartan  25 mg Oral Daily  . multivitamin with minerals  1 tablet Oral Daily  . niacin  1,000 mg Oral QHS  . nortriptyline  10 mg Oral QHS   Continuous Infusions: . sodium chloride 150 mL/hr at 10/19/18 1556     LOS: 0 days    Time spent: 35 minutes    Edwin Dada, MD [facility] 10/19/2018, 7:28 PM     Pager [phone] --- please page though AMION:  [email or web address] Password [email or web address] If 7PM-7AM, please contact night-coverage

## 2018-10-19 NOTE — ED Notes (Signed)
Pt still reports heartburn.  Will notify provider.

## 2018-10-19 NOTE — ED Notes (Signed)
Dr. Loleta Books made aware of pt needing cardiac monitoring discontinued for her med-surg bed.

## 2018-10-20 LAB — COMPREHENSIVE METABOLIC PANEL
ALT: 110 U/L — ABNORMAL HIGH (ref 0–44)
ANION GAP: 8 (ref 5–15)
AST: 83 U/L — ABNORMAL HIGH (ref 15–41)
Albumin: 3.2 g/dL — ABNORMAL LOW (ref 3.5–5.0)
Alkaline Phosphatase: 130 U/L — ABNORMAL HIGH (ref 38–126)
BILIRUBIN TOTAL: 1.2 mg/dL (ref 0.3–1.2)
BUN: 6 mg/dL — ABNORMAL LOW (ref 8–23)
CALCIUM: 8.4 mg/dL — AB (ref 8.9–10.3)
CO2: 27 mmol/L (ref 22–32)
Chloride: 102 mmol/L (ref 98–111)
Creatinine, Ser: 0.58 mg/dL (ref 0.44–1.00)
Glucose, Bld: 114 mg/dL — ABNORMAL HIGH (ref 70–99)
Potassium: 4 mmol/L (ref 3.5–5.1)
Sodium: 137 mmol/L (ref 135–145)
TOTAL PROTEIN: 6.3 g/dL — AB (ref 6.5–8.1)

## 2018-10-20 LAB — GLUCOSE, CAPILLARY
GLUCOSE-CAPILLARY: 98 mg/dL (ref 70–99)
GLUCOSE-CAPILLARY: 119 mg/dL — AB (ref 70–99)
GLUCOSE-CAPILLARY: 103 mg/dL — AB (ref 70–99)
Glucose-Capillary: 109 mg/dL — ABNORMAL HIGH (ref 70–99)
Glucose-Capillary: 98 mg/dL (ref 70–99)

## 2018-10-20 LAB — CBC
HCT: 35.8 % — ABNORMAL LOW (ref 36.0–46.0)
Hemoglobin: 11.4 g/dL — ABNORMAL LOW (ref 12.0–15.0)
MCH: 29.1 pg (ref 26.0–34.0)
MCHC: 31.8 g/dL (ref 30.0–36.0)
MCV: 91.3 fL (ref 80.0–100.0)
NRBC: 0 % (ref 0.0–0.2)
PLATELETS: 123 10*3/uL — AB (ref 150–400)
RBC: 3.92 MIL/uL (ref 3.87–5.11)
RDW: 13.1 % (ref 11.5–15.5)
WBC: 5.9 10*3/uL (ref 4.0–10.5)

## 2018-10-20 LAB — LIPASE, BLOOD: Lipase: 679 U/L — ABNORMAL HIGH (ref 11–51)

## 2018-10-20 LAB — HIV ANTIBODY (ROUTINE TESTING W REFLEX): HIV SCREEN 4TH GENERATION: NONREACTIVE

## 2018-10-20 LAB — IGG 4: IGG 4: 10 mg/dL (ref 2–96)

## 2018-10-20 NOTE — Progress Notes (Signed)
[facility] Progress Note  Subjective: Patient feels somewhat better today. Less abdominal pain.  Objective: Vital signs in last 24 hours: Temp:  [98 F (36.7 C)-98.2 F (36.8 C)] 98.2 F (36.8 C) (10/31 0537) Pulse Rate:  [75-84] 84 (10/31 0537) Resp:  [18-20] 20 (10/31 0537) BP: (133-157)/(73-80) 157/80 (10/31 0537) SpO2:  [91 %-97 %] 96 % (10/31 0537) Weight change:    PE:  No distress  Heart regular rhythm  Lungs clear  Abdomen soft some tenderness in the epigastrium and also in the left lower quadrant of the abdomen but no rebound or guarding  Lab Results: Results for orders placed or performed during the hospital encounter of 10/18/18 (from the past 24 hour(s))  CBG monitoring, ED     Status: Abnormal   Collection Time: 10/19/18 12:07 PM  Result Value Ref Range   Glucose-Capillary 107 (H) 70 - 99 mg/dL  Glucose, capillary     Status: None   Collection Time: 10/19/18  4:55 PM  Result Value Ref Range   Glucose-Capillary 94 70 - 99 mg/dL  Glucose, capillary     Status: None   Collection Time: 10/19/18  8:15 PM  Result Value Ref Range   Glucose-Capillary 96 70 - 99 mg/dL  Glucose, capillary     Status: None   Collection Time: 10/20/18  2:01 AM  Result Value Ref Range   Glucose-Capillary 98 70 - 99 mg/dL   Comment 1 Notify RN    Comment 2 Document in Chart   Comprehensive metabolic panel     Status: Abnormal   Collection Time: 10/20/18  4:40 AM  Result Value Ref Range   Sodium 137 135 - 145 mmol/L   Potassium 4.0 3.5 - 5.1 mmol/L   Chloride 102 98 - 111 mmol/L   CO2 27 22 - 32 mmol/L   Glucose, Bld 114 (H) 70 - 99 mg/dL   BUN 6 (L) 8 - 23 mg/dL   Creatinine, Ser 0.58 0.44 - 1.00 mg/dL   Calcium 8.4 (L) 8.9 - 10.3 mg/dL   Total Protein 6.3 (L) 6.5 - 8.1 g/dL   Albumin 3.2 (L) 3.5 - 5.0 g/dL   AST 83 (H) 15 - 41 U/L   ALT 110 (H) 0 - 44 U/L   Alkaline Phosphatase 130 (H) 38 - 126 U/L   Total Bilirubin 1.2 0.3 - 1.2 mg/dL   GFR calc non Af  Amer >60 >60 mL/min   GFR calc Af Amer >60 >60 mL/min   Anion gap 8 5 - 15  CBC     Status: Abnormal   Collection Time: 10/20/18  4:40 AM  Result Value Ref Range   WBC 5.9 4.0 - 10.5 K/uL   RBC 3.92 3.87 - 5.11 MIL/uL   Hemoglobin 11.4 (L) 12.0 - 15.0 g/dL   HCT 35.8 (L) 36.0 - 46.0 %   MCV 91.3 80.0 - 100.0 fL   MCH 29.1 26.0 - 34.0 pg   MCHC 31.8 30.0 - 36.0 g/dL   RDW 13.1 11.5 - 15.5 %   Platelets 123 (L) 150 - 400 K/uL   nRBC 0.0 0.0 - 0.2 %  Glucose, capillary     Status: Abnormal   Collection Time: 10/20/18  8:21 AM  Result Value Ref Range   Glucose-Capillary 109 (H) 70 - 99 mg/dL    Studies/Results: No results found.    Assessment: Acute pancreatitis etiology unclear  Plan:   Continue supportive care and treatment for pancreatitis. Keep nothing by mouth  one more day except for ice chips. Lipase pending today. If she is feeling better tomorrow would advance to clear liquids. The patient may benefit from an EUS as an outpatient when she is discharged from the hospital to look for any other etiology for this pancreatitis.    Cassell Clement 10/20/2018, 9:54 AM  Pager: [phone] If no answer or after 5 PM call [phone]

## 2018-10-20 NOTE — Progress Notes (Signed)
"  I have reviewed and concurred with this student's documentation"

## 2018-10-20 NOTE — Progress Notes (Signed)
PROGRESS NOTE    Jasmine Davila  YBW:[id number] DOB: Oct 13, 1951 DOA: 10/18/2018 PCP: [name], DO   Brief Narrative:  66-year with history of obstructive sleep apnea on CPAP, non-Hodgkin's lymphoma in remission, essential hypertension, diabetes mellitus type 2 came to the hospital with complaints of abdominal pain.  She was found to acute pancreatitis and she has had recurrent episodes of these.  Over the last 10 years she has had about 6 episodes, her last episode was in April 2019.   Assessment & Plan:   Principal Problem:   Acute on chronic pancreatitis (HCC) Active Problems:   GERD (gastroesophageal reflux disease)   Diabetes mellitus due to underlying condition with diabetic nephropathy, with long-term current use of insulin (HCC)   Sleep apnea with CPAP   Depression   Hypertension  Abdominal pain secondary to acute recurrent pancreatitis, unknown etiology -Her initial lipase level was greater than 3000, this is trended down to 679 -Continue IV fluids, n.p.o. today with ice chips.  Plan on advancing to clear liquid diet tomorrow -Continue pain control, bowel regimen as necessary - Further work-up per gastroenterology  Essential hypertension - Continue losartan 25 mg daily  Diabetes mellitus type 2, insulin-dependent -Continue Lantus 38 units daily.  Insulin sliding scale and Accu-Chek  Peripheral neuropathy secondary to diabetes -Continue Neurontin 1200 mg 3 times daily next line GERD -Continue PPI  DVT prophylaxis: Lovenox Code Status: Full code Family Communication: None at bedside Disposition Plan: Maintain inpatient stay until abdominal symptoms have started improving  Consultants:   Gastroenterology  Procedures:   None  Antimicrobials:   None   Subjective: States abdominal pain is little better this morning.  Denies any nausea and vomiting.  Review of Systems Otherwise negative except as per HPI, including: General: Denies  fever, chills, night sweats or unintended weight loss. Resp: Denies cough, wheezing, shortness of breath. Cardiac: Denies chest pain, palpitations, orthopnea, paroxysmal nocturnal dyspnea. GI: Denies nausea, vomiting, diarrhea or constipation GU: Denies dysuria, frequency, hesitancy or incontinence MS: Denies muscle aches, joint pain or swelling Neuro: Denies headache, neurologic deficits (focal weakness, numbness, tingling), abnormal gait Psych: Denies anxiety, depression, SI/HI/AVH Skin: Denies new rashes or lesions ID: Denies sick contacts, exotic exposures, travel  Objective: Vitals:   10/19/18 1627 10/19/18 1628 10/19/18 2222 10/20/18 0537  BP:  (!) 150/79 (!) 153/73 (!) 157/80  Pulse:  75 78 84  Resp:  18 20 20   Temp: 98 F (36.7 C) 98 F (36.7 C) 98.2 F (36.8 C) 98.2 F (36.8 C)  TempSrc:  Oral Oral Oral  SpO2:  97% 91% 96%  Weight:      Height:        Intake/Output Summary (Last 24 hours) at 10/20/2018 1157 Last data filed at 10/20/2018 1051 Gross per 24 hour  Intake 1651.13 ml  Output 1625 ml  Net 26.13 ml   Filed Weights   10/18/18 1545  Weight: 106.6 kg    Examination:  General exam: Appears calm and comfortable, dry mouth Respiratory system: Clear to auscultation. Respiratory effort normal. Cardiovascular system: S1 & S2 heard, RRR. No JVD, murmurs, rubs, gallops or clicks. No pedal edema. Gastrointestinal system: Abdomen is nondistended, soft and nontender. No organomegaly or masses felt. Normal bowel sounds heard. Central nervous system: Alert and oriented. No focal neurological deficits. Extremities: Symmetric 5 x 5 power. Skin: No rashes, lesions or ulcers Psychiatry: Judgement and insight appear normal. Mood & affect appropriate.     Data Reviewed:   CBC: Recent Labs  Lab 10/18/18 1758 10/19/18 0605 10/20/18 0440  WBC 9.9 5.5 5.9  HGB 13.7 12.5 11.4*  HCT 42.0 38.4 35.8*  MCV 88.8 88.9 91.3  PLT 166 136* 188*   Basic Metabolic  Panel: Recent Labs  Lab 10/18/18 1758 10/19/18 0605 10/20/18 0440  NA 135 135 137  K 3.7 3.6 4.0  CL 97* 101 102  CO2 30 28 27   GLUCOSE 201* 136* 114*  BUN 11 8 6*  CREATININE 0.80 0.56 0.58  CALCIUM 9.5 8.7* 8.4*   GFR: Estimated Creatinine Clearance: 85.7 mL/min (by C-G formula based on SCr of 0.58 mg/dL). Liver Function Tests: Recent Labs  Lab 10/18/18 1758 10/20/18 0440  AST 34 83*  ALT 27 110*  ALKPHOS 85 130*  BILITOT 0.8 1.2  PROT 8.3* 6.3*  ALBUMIN 4.3 3.2*   Recent Labs  Lab 10/18/18 1758 10/20/18 0440  LIPASE 3,544* 679*   No results for input(s): AMMONIA in the last 168 hours. Coagulation Profile: No results for input(s): INR, PROTIME in the last 168 hours. Cardiac Enzymes: No results for input(s): CKTOTAL, CKMB, CKMBINDEX, TROPONINI in the last 168 hours. BNP (last 3 results) No results for input(s): PROBNP in the last 8760 hours. HbA1C: No results for input(s): HGBA1C in the last 72 hours. CBG: Recent Labs  Lab 10/19/18 1207 10/19/18 1655 10/19/18 2015 10/20/18 0201 10/20/18 0821  GLUCAP 107* 94 96 98 109*   Lipid Profile: Recent Labs    10/18/18 2347  TRIG 67   Thyroid Function Tests: No results for input(s): TSH, T4TOTAL, FREET4, T3FREE, THYROIDAB in the last 72 hours. Anemia Panel: No results for input(s): VITAMINB12, FOLATE, FERRITIN, TIBC, IRON, RETICCTPCT in the last 72 hours. Sepsis Labs: No results for input(s): PROCALCITON, LATICACIDVEN in the last 168 hours.  No results found for this or any previous visit (from the past 240 hour(s)).       Radiology Studies: Dg Chest 2 View  Result Date: 10/18/2018 CLINICAL DATA:  Upper abdominal pain radiating to the left breast. EXAM: CHEST - 2 VIEW COMPARISON:  04/13/2018 FINDINGS: The heart size and mediastinal contours are within normal limits. Mild atherosclerosis of the thoracic aorta. Both lungs are clear. The visualized skeletal structures are unremarkable. IMPRESSION: No  active cardiopulmonary disease. Electronically Signed   By: Ashley Royalty M.D.   On: 10/18/2018 17:42   Ct Abdomen Pelvis W Contrast  Result Date: 10/18/2018 CLINICAL DATA:  Upper abdominal pain radiating to left breast and right breast. Shortness-of-breath sudden onset 2 hours ago. Nausea and vomiting several days ago. History of diffuse large B-cell lymphoma. EXAM: CT ABDOMEN AND PELVIS WITH CONTRAST TECHNIQUE: Multidetector CT imaging of the abdomen and pelvis was performed using the standard protocol following bolus administration of intravenous contrast. CONTRAST:  173mL ISOVUE-300 IOPAMIDOL (ISOVUE-300) INJECTION 61% COMPARISON:  08/31/2018, 04/13/2018 and 02/14/2015. FINDINGS: Lower chest: Minimal linear atelectasis/scarring posterior left lower lobe unchanged. Hepatobiliary: Previous cholecystectomy. Liver is within normal. There is mild prominence of the central intrahepatic ducts unchanged. Minimal prominence of the common bile duct unchanged. Pancreas: Moderate peripancreatic fluid/inflammation compatible with acute pancreatitis. No evidence of necrosis. Spleen: Couple small splenic hypodensities unchanged from 04/13/2018. Adrenals/Urinary Tract: Adrenal glands are normal. Kidneys are normal in size without hydronephrosis or nephrolithiasis. Ureters and bladder are normal. Stomach/Bowel: Stomach and small bowel are normal. Appendix is normal. Colon is within normal. Vascular/Lymphatic: Mild calcified plaque involving the abdominal aorta. No adenopathy. Reproductive: Normal. Other: None. Musculoskeletal: Degenerative change of the spine. IMPRESSION: Evidence of acute pancreatitis.  No necrosis and no other complications. Couple small splenic hypodensities unchanged from April 2019. Aortic Atherosclerosis (ICD10-I70.0). Electronically Signed   By: Marin Olp M.D.   On: 10/18/2018 22:48   US Abdomen Limited Ruq  Result Date: 10/19/2018 CLINICAL DATA:  Acute on chronic pancreatitis with nausea and  vomiting. Previous cholecystectomy. EXAM: ULTRASOUND ABDOMEN LIMITED RIGHT UPPER QUADRANT COMPARISON:  None. FINDINGS: Gallbladder: Previous cholecystectomy. Common bile duct: Diameter: 13 mm.  No ductal stones. Liver: Increased parenchymal echogenicity compatible with steatosis without focal mass. Portal vein is patent on color Doppler imaging with normal direction of blood flow towards the liver. Incidentally noted is peripancreatic fluid. IMPRESSION: Previous cholecystectomy. Mild hepatic steatosis without focal mass. Peripancreatic fluid compatible with known acute pancreatitis. Electronically Signed   By: Marin Olp M.D.   On: 10/19/2018 01:32        Scheduled Meds: . aspirin EC  81 mg Oral QHS  . cholecalciferol  5,000 Units Oral Daily  . cyanocobalamin  500 mcg Intramuscular Q30 days  . enoxaparin (LOVENOX) injection  40 mg Subcutaneous Q24H  . famotidine  20 mg Oral BID  . folic acid  585 mcg Oral Daily  . gabapentin  1,200 mg Oral TID  . insulin aspart  0-9 Units Subcutaneous TID WC  . insulin glargine  38 Units Subcutaneous Q2200  . losartan  25 mg Oral Daily  . multivitamin with minerals  1 tablet Oral Daily  . niacin  1,000 mg Oral QHS  . nortriptyline  10 mg Oral QHS   Continuous Infusions: . sodium chloride 150 mL/hr at 10/20/18 0942     LOS: 1 day   Time spent= 25 mins    Prescilla Monger Arsenio Loader, MD [facility] Pager [phone]   If 7PM-7AM, please contact night-coverage [email or web address] Password [email or web address] 10/20/2018, 11:57 AM

## 2018-10-20 NOTE — Progress Notes (Signed)
Pt refused CPAP qhs.  Pt encouraged to contact RT should she change her mind.  

## 2018-10-21 LAB — GLUCOSE, CAPILLARY
GLUCOSE-CAPILLARY: 89 mg/dL (ref 70–99)
Glucose-Capillary: 107 mg/dL — ABNORMAL HIGH (ref 70–99)
Glucose-Capillary: 221 mg/dL — ABNORMAL HIGH (ref 70–99)
Glucose-Capillary: 128 mg/dL — ABNORMAL HIGH (ref 70–99)

## 2018-10-21 NOTE — Progress Notes (Signed)
Pt again refused CPAP qhs.  Pt encouraged to contact RT should she change her mind.

## 2018-10-21 NOTE — Progress Notes (Signed)
[facility] Progress Note  Subjective: The patient feels much better today. No complaints of abdominal pain.  Objective: Vital signs in last 24 hours: Temp:  [98.4 F (36.9 C)-98.7 F (37.1 C)] 98.4 F (36.9 C) (11/01 0509) Pulse Rate:  [78-82] 79 (11/01 0509) Resp:  [16] 16 (11/01 0509) BP: (147-154)/(72-78) 152/78 (11/01 0509) SpO2:  [97 %-98 %] 97 % (11/01 0509) Weight change:    PE:  Abdomen soft  Lab Results: Results for orders placed or performed during the hospital encounter of 10/18/18 (from the past 24 hour(s))  Glucose, capillary     Status: Abnormal   Collection Time: 10/20/18 12:02 PM  Result Value Ref Range   Glucose-Capillary 119 (H) 70 - 99 mg/dL  Glucose, capillary     Status: Abnormal   Collection Time: 10/20/18  4:43 PM  Result Value Ref Range   Glucose-Capillary 103 (H) 70 - 99 mg/dL  Glucose, capillary     Status: None   Collection Time: 10/20/18  9:38 PM  Result Value Ref Range   Glucose-Capillary 98 70 - 99 mg/dL  Glucose, capillary     Status: Abnormal   Collection Time: 10/21/18  7:40 AM  Result Value Ref Range   Glucose-Capillary 107 (H) 70 - 99 mg/dL    Studies/Results: No results found.    Assessment:  acute pancreatitis etiology unclear  Plan:   Advance to clear liquids today. Then advance diet as tolerated. From a GI standpoint she is improving. Probably can go home when she tolerates solid food. She can follow-up with Dr. Watt Climes as an outpatient. She may benefit from an endoscopic ultrasound as an outpatient.. We will sign off. Call us if needed.    Cassell Clement 10/21/2018, 9:29 AM  Pager: [phone] If no answer or after 5 PM call [phone]

## 2018-10-21 NOTE — Progress Notes (Signed)
[facility] PROGRESS NOTE    Progress Note  Jasmine Davila  YQI:[id number] DOB: 1951-03-23 DOA: 10/18/2018 PCP: [name], DO     Brief Narrative:   Jasmine Davila is an 67 y.o. female past medical history of sleep apnea on CPAP, non-Hodgkin's lymphoma in remission diabetes mellitus type 2 comes into the hospital with abdominal pain was found to have be an acute pancreatitis  Assessment/Plan:   Acute on chronic pancreatitis ([place]) Of unknown etiology. We have KVO IV fluids she has tolerated clears discontinue narcotics. If she tolerates clears today can advance to full liquids tonight   Essential hypertension: Continue losartan 25 mg daily.  Diabetes mellitus mellitus type II/ Diabetes mellitus due to underlying condition with diabetic nephropathy, with long-term current use  of insulin ([place]): Continue sliding scale.  Peripheral neuropathy secondary to diabetes mellitus: Continue Neurontin.  GERD (gastroesophageal reflux disease) PPI    DVT prophylaxis: lovenox Family Communication:none Disposition Plan/Barrier to D/C: unable to determine Code Status:     Code Status Orders  (From admission, onward)         Start     Ordered   10/18/18 2348  Full code  Continuous     10/18/18 2349        Code Status History    Date Active Date Inactive Code Status Order ID Comments User Context   04/13/2018 2241 04/15/2018 1757 Full Code [id number]  Rise Patience, MD Inpatient        IV Access:    Peripheral IV   Procedures and diagnostic studies:   No results found.   Medical Consultants:    None.  Anti-Infectives:   None  Subjective:    Jasmine Davila no complaints feels great.  Objective:    Vitals:   10/20/18 0537 10/20/18 1425 10/20/18 2137 10/21/18 0509  BP: (!) 157/80 (!) 147/72 (!) 154/75 (!) 152/78  Pulse: 84 82 78 79  Resp: 20 16 16 16   Temp: 98.2 F (36.8 C) 98.4 F (36.9 C) 98.7 F  (37.1 C) 98.4 F (36.9 C)  TempSrc: Oral Oral Oral Oral  SpO2: 96% 98% 97% 97%  Weight:      Height:        Intake/Output Summary (Last 24 hours) at 10/21/2018 1041 Last data filed at 10/21/2018 9518 Gross per 24 hour  Intake 3750 ml  Output 3800 ml  Net -50 ml   Filed Weights   10/18/18 1545  Weight: 106.6 kg    Exam: General exam: In no acute distress. Respiratory system: Good air movement and clear to auscultation. Cardiovascular system: S1 & S2 heard, RRR.  Gastrointestinal system: Abdomen is nondistended, soft and nontender.  Central nervous system: Alert and oriented. No focal neurological deficits. Extremities: No pedal edema. Skin: No rashes, lesions or ulcers Psychiatry: Judgement and insight appear normal. Mood & affect appropriate.    Data Reviewed:    Labs: Basic Metabolic Panel: Recent Labs  Lab 10/18/18 1758 10/19/18 0605 10/20/18 0440  NA 135 135 137  K 3.7 3.6 4.0  CL 97* 101 102  CO2 30 28 27   GLUCOSE 201* 136* 114*  BUN 11 8 6*  CREATININE 0.80 0.56 0.58  CALCIUM 9.5 8.7* 8.4*   GFR Estimated Creatinine Clearance: 85.7 mL/min (by C-G formula based on SCr of 0.58 mg/dL). Liver Function Tests: Recent Labs  Lab 10/18/18 1758 10/20/18 0440  AST 34 83*  ALT 27 110*  ALKPHOS 85 130*  BILITOT 0.8 1.2  PROT  8.3* 6.3*  ALBUMIN 4.3 3.2*   Recent Labs  Lab 10/18/18 1758 10/20/18 0440  LIPASE 3,544* 679*   No results for input(s): AMMONIA in the last 168 hours. Coagulation profile No results for input(s): INR, PROTIME in the last 168 hours.  CBC: Recent Labs  Lab 10/18/18 1758 10/19/18 0605 10/20/18 0440  WBC 9.9 5.5 5.9  HGB 13.7 12.5 11.4*  HCT 42.0 38.4 35.8*  MCV 88.8 88.9 91.3  PLT 166 136* 123*   Cardiac Enzymes: No results for input(s): CKTOTAL, CKMB, CKMBINDEX, TROPONINI in the last 168 hours. BNP (last 3 results) No results for input(s): PROBNP in the last 8760 hours. CBG: Recent Labs  Lab 10/20/18 0821  10/20/18 1202 10/20/18 1643 10/20/18 2138 10/21/18 0740  GLUCAP 109* 119* 103* 98 107*   D-Dimer: No results for input(s): DDIMER in the last 72 hours. Hgb A1c: No results for input(s): HGBA1C in the last 72 hours. Lipid Profile: Recent Labs    10/18/18 2347  TRIG 67   Thyroid function studies: No results for input(s): TSH, T4TOTAL, T3FREE, THYROIDAB in the last 72 hours.  Invalid input(s): FREET3 Anemia work up: No results for input(s): VITAMINB12, FOLATE, FERRITIN, TIBC, IRON, RETICCTPCT in the last 72 hours. Sepsis Labs: Recent Labs  Lab 10/18/18 1758 10/19/18 0605 10/20/18 0440  WBC 9.9 5.5 5.9   Microbiology No results found for this or any previous visit (from the past 240 hour(s)).   Medications:   . aspirin EC  81 mg Oral QHS  . cholecalciferol  5,000 Units Oral Daily  . cyanocobalamin  500 mcg Intramuscular Q30 days  . enoxaparin (LOVENOX) injection  40 mg Subcutaneous Q24H  . famotidine  20 mg Oral BID  . folic acid  502 mcg Oral Daily  . gabapentin  1,200 mg Oral TID  . insulin aspart  0-9 Units Subcutaneous TID WC  . insulin glargine  38 Units Subcutaneous Q2200  . losartan  25 mg Oral Daily  . multivitamin with minerals  1 tablet Oral Daily  . niacin  1,000 mg Oral QHS  . nortriptyline  10 mg Oral QHS   Continuous Infusions: . sodium chloride 150 mL/hr at 10/21/18 0750      LOS: 2 days   [facility] Pager [phone]  *Please refer to [email or web address], password [email or web address] to get updated schedule on who will round on this patient, as hospitalists switch teams weekly. If 7PM-7AM, please contact night-coverage at [email or web address], password [email or web address] for any overnight needs.  10/21/2018, 10:41 AM

## 2018-10-22 DIAGNOSIS — Z794 Long term (current) use of insulin: Secondary | ICD-10-CM

## 2018-10-22 DIAGNOSIS — E0821 Diabetes mellitus due to underlying condition with diabetic nephropathy: Secondary | ICD-10-CM

## 2018-10-22 DIAGNOSIS — I10 Essential (primary) hypertension: Secondary | ICD-10-CM

## 2018-10-22 DIAGNOSIS — K859 Acute pancreatitis without necrosis or infection, unspecified: Secondary | ICD-10-CM

## 2018-10-22 DIAGNOSIS — K861 Other chronic pancreatitis: Secondary | ICD-10-CM

## 2018-10-22 LAB — COMPREHENSIVE METABOLIC PANEL
ALBUMIN: 4.1 g/dL (ref 3.5–5.0)
ALK PHOS: 104 U/L (ref 38–126)
ALT: 62 U/L — AB (ref 0–44)
AST: 41 U/L (ref 15–41)
Anion gap: 8 (ref 5–15)
CALCIUM: 9.6 mg/dL (ref 8.9–10.3)
CHLORIDE: 101 mmol/L (ref 98–111)
CO2: 29 mmol/L (ref 22–32)
CREATININE: 0.62 mg/dL (ref 0.44–1.00)
GFR calc non Af Amer: >60 mL/min (ref >60)
GLUCOSE: 148 mg/dL — AB (ref 70–99)
Potassium: 3.6 mmol/L (ref 3.5–5.1)
SODIUM: 138 mmol/L (ref 135–145)
Total Bilirubin: 1 mg/dL (ref 0.3–1.2)
Total Protein: 7.8 g/dL (ref 6.5–8.1)

## 2018-10-22 LAB — GLUCOSE, CAPILLARY
GLUCOSE-CAPILLARY: 121 mg/dL — AB (ref 70–99)
Glucose-Capillary: 133 mg/dL — ABNORMAL HIGH (ref 70–99)

## 2018-10-22 NOTE — Discharge Summary (Signed)
Physician Discharge Summary  Jasmine Davila HAL:[id number] DOB: [birth year] DOA: 10/18/2018  PCP: [name], DO  Admit date: 10/18/2018 Discharge date: 10/22/2018  Admitted From: Home Disposition:  home  Recommendations for Outpatient Follow-up:  1. Follow up with PCP in 1-2 weeks  Discharge Condition:Improved CODE STATUS:Full Diet recommendation: Diabetic   Brief/Interim Summary: 67 y.o. female past medical history of sleep apnea on CPAP, non-Hodgkin's lymphoma in remission diabetes mellitus type 2 comes into the hospital with abdominal pain was found to have be an acute pancreatitis  Acute on chronic pancreatitis ([place]) Of unknown etiology. Improved with bowel rest and supportive care Seen by GI Continue to advance diet as outpatient   Essential hypertension: Continued losartan 25 mg daily.  Diabetes mellitus mellitus type II/ Diabetes mellitus due to underlying condition with diabetic nephropathy, with long-term current use  of insulin ([place]): Continued sliding scale while in hospital  Peripheral neuropathy secondary to diabetes mellitus: Continued Neurontin.  GERD (gastroesophageal reflux disease) PPI   Discharge Diagnoses:  Principal Problem:   Acute on chronic pancreatitis ([place]) Active Problems:   GERD (gastroesophageal reflux disease)   Diabetes mellitus due to underlying condition with diabetic nephropathy, with long-term current use of insulin ([place])   Sleep apnea with CPAP   Depression   Hypertension    Discharge Instructions   Allergies as of 10/22/2018      Reactions   Dilaudid [hydromorphone Hcl] Other (See Comments)   anxious   Doxycycline Other (See Comments)   Pancreatic issues   Metronidazole Other (See Comments)   Cream  Burned face   Pseudoephedrine Hcl Other (See Comments)   Pt unsure      Medication List    TAKE these medications   aspirin EC 81 MG tablet Take 81 mg by mouth at bedtime.   cyanocobalamin 1000  MCG/ML injection Commonly known as:  (VITAMIN B-12) Inject 0.5 mLs (500 mcg total) into the muscle every 30 (thirty) days.   famotidine 20 MG tablet Commonly known as:  PEPCID TAKE 1 TABLET BY MOUTH 2 TIMES DAILY.   fluticasone 50 MCG/ACT nasal spray Commonly known as:  FLONASE Place 1 spray into both nostrils daily as needed for allergies or rhinitis.   folic acid 735 MCG tablet Commonly known as:  FOLVITE Take 400 mcg by mouth daily.   gabapentin 300 MG capsule Commonly known as:  NEURONTIN TAKE 4 CAPSULES BY MOUTH 3 TIMES DAILY. What changed:  See the new instructions.   HUMALOG KWIKPEN 100 UNIT/ML KiwkPen Generic drug:  insulin lispro INJECT 20 UNITS INTO THE SKIN DAILY WITH LARGEST MEAL What changed:  See the new instructions.   LANTUS SOLOSTAR 100 UNIT/ML Solostar Pen Generic drug:  Insulin Glargine INJECT 60 UNITS INTO THE SKIN EVERY MORNING. What changed:  See the new instructions.   losartan 25 MG tablet Commonly known as:  COZAAR TAKE 1 TABLET BY MOUTH DAILY.   multivitamin tablet Take 1 tablet by mouth daily.   niacin 1000 MG CR tablet Commonly known as:  NIASPAN Take 1 tablet (1,000 mg total) by mouth at bedtime. An hour after 81 mg ASA   NONFORMULARY OR COMPOUNDED ITEM [facility]  Peripheral Neuropathy Cream- Bupivacaine 1%, Doxepin 3%, Gabapentin 6%, Pentoxifylline 3%, Topiramate 1% Apply 1-2 grams to affected area 3-4 times daily Qty. 120 gm 3 refills   nortriptyline 10 MG capsule Commonly known as:  PAMELOR Start nortriptyline 10mg  at bedtime for 2 week, then increase to 2 tablet at bedtime What changed:  how much to take  how to take this  when to take this  additional instructions   Vitamin D-3 5000 units Tabs Take 1 tablet by mouth daily.      Follow-up Information    [name], DO. Schedule an appointment as soon as possible for a visit in 2 week(s).   Specialty:  Family Medicine Contact information: [id number] [place] [address] [phone]          Allergies  Allergen Reactions  . Dilaudid [Hydromorphone Hcl] Other (See Comments)    anxious  . Doxycycline Other (See Comments)    Pancreatic issues  . Metronidazole Other (See Comments)    Cream  Burned face  . Pseudoephedrine Hcl Other (See Comments)    Pt unsure    Consultations:  GI  Procedures/Studies: Dg Chest 2 View  Result Date: 10/18/2018 CLINICAL DATA:  Upper abdominal pain radiating to the left breast. EXAM: CHEST - 2 VIEW COMPARISON:  04/13/2018 FINDINGS: The heart size and mediastinal contours are within normal limits. Mild atherosclerosis of the thoracic aorta. Both lungs are clear. The visualized skeletal structures are unremarkable. IMPRESSION: No active cardiopulmonary disease. Electronically Signed   By: Ashley Royalty M.D.   On: 10/18/2018 17:42   Ct Abdomen Pelvis W Contrast  Result Date: 10/18/2018 CLINICAL DATA:  Upper abdominal pain radiating to left breast and right breast. Shortness-of-breath sudden onset 2 hours ago. Nausea and vomiting several days ago. History of diffuse large B-cell lymphoma. EXAM: CT ABDOMEN AND PELVIS WITH CONTRAST TECHNIQUE: Multidetector CT imaging of the abdomen and pelvis was performed using the standard protocol following bolus administration of intravenous contrast. CONTRAST:  121mL ISOVUE-300 IOPAMIDOL (ISOVUE-300) INJECTION 61% COMPARISON:  08/31/2018, 04/13/2018 and 02/14/2015. FINDINGS: Lower chest: Minimal linear atelectasis/scarring posterior left lower lobe unchanged. Hepatobiliary: Previous cholecystectomy. Liver is within normal. There is mild prominence of the central intrahepatic ducts unchanged. Minimal prominence of the common bile duct unchanged. Pancreas: Moderate peripancreatic fluid/inflammation compatible with acute pancreatitis. No evidence of necrosis. Spleen: Couple small splenic hypodensities unchanged from 04/13/2018. Adrenals/Urinary Tract: Adrenal  glands are normal. Kidneys are normal in size without hydronephrosis or nephrolithiasis. Ureters and bladder are normal. Stomach/Bowel: Stomach and small bowel are normal. Appendix is normal. Colon is within normal. Vascular/Lymphatic: Mild calcified plaque involving the abdominal aorta. No adenopathy. Reproductive: Normal. Other: None. Musculoskeletal: Degenerative change of the spine. IMPRESSION: Evidence of acute pancreatitis. No necrosis and no other complications. Couple small splenic hypodensities unchanged from April 2019. Aortic Atherosclerosis (ICD10-I70.0). Electronically Signed   By: Marin Olp M.D.   On: 10/18/2018 22:48   US Abdomen Limited Ruq  Result Date: 10/19/2018 CLINICAL DATA:  Acute on chronic pancreatitis with nausea and vomiting. Previous cholecystectomy. EXAM: ULTRASOUND ABDOMEN LIMITED RIGHT UPPER QUADRANT COMPARISON:  None. FINDINGS: Gallbladder: Previous cholecystectomy. Common bile duct: Diameter: 13 mm.  No ductal stones. Liver: Increased parenchymal echogenicity compatible with steatosis without focal mass. Portal vein is patent on color Doppler imaging with normal direction of blood flow towards the liver. Incidentally noted is peripancreatic fluid. IMPRESSION: Previous cholecystectomy. Mild hepatic steatosis without focal mass. Peripancreatic fluid compatible with known acute pancreatitis. Electronically Signed   By: Marin Olp M.D.   On: 10/19/2018 01:32     Subjective: Eager to go home today. Reports feeling much better  Discharge Exam: Vitals:   10/22/18 0548 10/22/18 1332  BP: (!) 142/84 (!) 162/84  Pulse: 78 77  Resp: 15 18  Temp: 98.6 F (37 C) 98  F (36.7 C)  SpO2: 96% 97%   Vitals:   10/21/18 2125 10/21/18 2216 10/22/18 0548 10/22/18 1332  BP: (!) 173/83 (!) 172/79 (!) 142/84 (!) 162/84  Pulse: 74 76 78 77  Resp: 16  15 18   Temp: 99.1 F (37.3 C)  98.6 F (37 C) 98 F (36.7 C)  TempSrc: Oral  Oral   SpO2: 98%  96% 97%  Weight:       Height:        General: Pt is alert, awake, not in acute distress Cardiovascular: RRR, S1/S2 +, no rubs, no gallops Respiratory: CTA bilaterally, no wheezing, no rhonchi Abdominal: Soft, NT, ND, bowel sounds + Extremities: no edema, no cyanosis   The results of significant diagnostics from this hospitalization (including imaging, microbiology, ancillary and laboratory) are listed below for reference.     Microbiology: No results found for this or any previous visit (from the past 240 hour(s)).   Labs: BNP (last 3 results) No results for input(s): BNP in the last 8760 hours. Basic Metabolic Panel: Recent Labs  Lab 10/18/18 1758 10/19/18 0605 10/20/18 0440 10/22/18 1213  NA 135 135 137 138  K 3.7 3.6 4.0 3.6  CL 97* 101 102 101  CO2 30 28 27 29   GLUCOSE 201* 136* 114* 148*  BUN 11 8 6* <5*  CREATININE 0.80 0.56 0.58 0.62  CALCIUM 9.5 8.7* 8.4* 9.6   Liver Function Tests: Recent Labs  Lab 10/18/18 1758 10/20/18 0440 10/22/18 1213  AST 34 83* 41  ALT 27 110* 62*  ALKPHOS 85 130* 104  BILITOT 0.8 1.2 1.0  PROT 8.3* 6.3* 7.8  ALBUMIN 4.3 3.2* 4.1   Recent Labs  Lab 10/18/18 1758 10/20/18 0440  LIPASE 3,544* 679*   No results for input(s): AMMONIA in the last 168 hours. CBC: Recent Labs  Lab 10/18/18 1758 10/19/18 0605 10/20/18 0440  WBC 9.9 5.5 5.9  HGB 13.7 12.5 11.4*  HCT 42.0 38.4 35.8*  MCV 88.8 88.9 91.3  PLT 166 136* 123*   Cardiac Enzymes: No results for input(s): CKTOTAL, CKMB, CKMBINDEX, TROPONINI in the last 168 hours. BNP: Invalid input(s): POCBNP CBG: Recent Labs  Lab 10/21/18 1204 10/21/18 1626 10/21/18 2127 10/22/18 0733 10/22/18 1238  GLUCAP 221* 89 128* 121* 133*   D-Dimer No results for input(s): DDIMER in the last 72 hours. Hgb A1c No results for input(s): HGBA1C in the last 72 hours. Lipid Profile No results for input(s): CHOL, HDL, LDLCALC, TRIG, CHOLHDL, LDLDIRECT in the last 72 hours. Thyroid function studies No  results for input(s): TSH, T4TOTAL, T3FREE, THYROIDAB in the last 72 hours.  Invalid input(s): FREET3 Anemia work up No results for input(s): VITAMINB12, FOLATE, FERRITIN, TIBC, IRON, RETICCTPCT in the last 72 hours. Urinalysis    Component Value Date/Time   COLORURINE YELLOW 10/19/2018 0605   APPEARANCEUR CLEAR 10/19/2018 0605   LABSPEC 1.033 (H) 10/19/2018 0605   PHURINE 6.0 10/19/2018 0605   GLUCOSEU NEGATIVE 10/19/2018 0605   HGBUR SMALL (A) 10/19/2018 0605   BILIRUBINUR NEGATIVE 10/19/2018 0605   BILIRUBINUR negative 06/08/2018 1116   KETONESUR NEGATIVE 10/19/2018 0605   PROTEINUR NEGATIVE 10/19/2018 0605   UROBILINOGEN 0.2 06/08/2018 1116   NITRITE NEGATIVE 10/19/2018 0605   LEUKOCYTESUR NEGATIVE 10/19/2018 0605   Sepsis Labs Invalid input(s): PROCALCITONIN,  WBC,  LACTICIDVEN Microbiology No results found for this or any previous visit (from the past 240 hour(s)).  Time spent: 25min  SIGNED:   Marylu Lund, MD  [facility] 10/22/2018,  2:13 PM  If 7PM-7AM, please contact night-coverage

## 2018-11-04 ENCOUNTER — Encounter: Payer: Self-pay | Admitting: Family Medicine

## 2018-11-04 ENCOUNTER — Ambulatory Visit (INDEPENDENT_AMBULATORY_CARE_PROVIDER_SITE_OTHER): Payer: Medicare Other | Admitting: Family Medicine

## 2018-11-04 VITALS — BP 109/72 | HR 89 | Temp 98.6°F | Ht 67.0 in | Wt 231.5 lb

## 2018-11-04 DIAGNOSIS — Z794 Long term (current) use of insulin: Secondary | ICD-10-CM | POA: Diagnosis not present

## 2018-11-04 DIAGNOSIS — K861 Other chronic pancreatitis: Secondary | ICD-10-CM

## 2018-11-04 DIAGNOSIS — Z23 Encounter for immunization: Secondary | ICD-10-CM

## 2018-11-04 DIAGNOSIS — K859 Acute pancreatitis without necrosis or infection, unspecified: Secondary | ICD-10-CM

## 2018-11-04 DIAGNOSIS — E0821 Diabetes mellitus due to underlying condition with diabetic nephropathy: Secondary | ICD-10-CM

## 2018-11-04 NOTE — Patient Instructions (Signed)
Jasmine Stabs your A1c on 09/19/2018 was 6.4 and under good control.  Let us bring you back and recheck that in 3 to 4 months.  There is some information below on pancreatitis which I know you already know but hopefully might find something new and helpful.    Acute Pancreatitis Acute pancreatitis is a condition in which the pancreas suddenly becomes irritated and swollen (has inflammation). The pancreas is a gland that is located behind the stomach. It produces enzymes that help to digest food. The pancreas also releases the hormones glucagon and insulin, which help to regulate blood sugar. Damage to the pancreas occurs when the digestive enzymes from the pancreas are activated before they are released into the intestine. Most acute attacks last a couple of days and can cause serious problems. Some people become dehydrated and develop low blood pressure. In severe cases, bleeding into the pancreas can lead to shock and can be life-threatening. The lungs, heart, and kidneys may fail. What are the causes? The most common causes of this condition are:  Alcohol abuse.  Gallstones.  Other causes include:  Certain medicines.  Exposure to certain chemicals.  Infection.  Damage caused by an accident (trauma).  Abdominal surgery.  In some cases, the cause may not be known. What are the signs or symptoms? Symptoms of this condition include:  Pain in the upper abdomen that may radiate to the back.  Tenderness and swelling of the abdomen.  Nausea and vomiting.  How is this diagnosed? This condition may be diagnosed based on:  A physical exam.  Blood tests.  Imaging tests, such as X-rays, CT scans, or an ultrasound of the abdomen.  How is this treated? Treatment for this condition usually requires a stay in the hospital. Treatment may include:  Pain medicine.  Fluid replacement through an IV tube.  Placing a tube in the stomach to remove stomach contents and to control vomiting (NG  tube, or nasogastric tube).  Not eating for 3-4 days. This gives the pancreas a rest, because enzymes are not being produced that can cause further damage.  Antibiotic medicines, if your condition is caused by an infection.  Surgery on the pancreas or gallbladder.  Follow these instructions at home: Eating and drinking  Follow instructions from your health care provider about diet. This may involve avoiding alcohol and decreasing the amount of fat in your diet.  Eat smaller, more frequent meals. This reduces the amount of digestive fluids that the pancreas produces.  Drink enough fluid to keep your urine clear or pale yellow.  Do not drink alcohol if it caused your condition. General instructions  Take over-the-counter and prescription medicines only as told by your health care provider.  Do not use any tobacco products, such as cigarettes, chewing tobacco, and e-cigarettes. If you need help quitting, ask your health care provider.  Get plenty of rest.  If directed, check your blood sugar at home as told by your health care provider.  Keep all follow-up visits as told by your health care provider. This is important. Contact a health care provider if:  You do not recover as quickly as expected.  You develop new or worsening symptoms.  You have persistent pain, weakness, or nausea.  You recover and then have another episode of pain.  You have a fever. Get help right away if:  You cannot eat or keep fluids down.  Your pain becomes severe.  Your skin or the white part of your eyes turns yellow (jaundice).  You vomit.  You feel dizzy or you faint.  Your blood sugar is high (over 300 mg/dL). This information is not intended to replace advice given to you by your health care provider. Make sure you discuss any questions you have with your health care provider. Document Released: 12/07/2005 Document Revised: 04/15/2016 Document Reviewed: 09/10/2015 Elsevier Interactive  Patient Education  2018 [employer].     Chronic Pancreatitis Chronic pancreatitis is long-lasting inflammation and scarring of the pancreas. The pancreas is a gland that is located behind the stomach. It produces enzymes that help to digest food. The pancreas also releases the hormones glucagon and insulin, which help to regulate blood sugar. Damage to the pancreas may affect digestion, cause pain in the upper abdomen and back, and cause diabetes. Inflammation can also irritate other abdominal organs near the pancreas. At the very beginning, pancreatitis may be sudden (acute). If acute pancreatitis is not caught in time or treated effectively, or if you have several or prolonged episodes of acute pancreatitis, then the condition can turn into chronic pancreatitis. What are the causes? The most common cause of this condition is alcohol abuse. Other causes include:  High levels of triglycerides in the blood (hypertriglyceridemia).  Gallstones or other conditions that can block the tube that drains the pancreas (pancreatic duct).  Pancreatic cancer.  Cystic fibrosis.  Too much calcium in the blood (hypercalcemia), which may be caused by an overactive parathyroid gland (hyperparathyroidism).  Certain medicines.  Injury to the pancreas.  Infection.  Autoimmune pancreatitis. This is when the body's disease-fighting (immune) system attacks the pancreas.  Genes that are passed along from parent to child (inherited).  In some cases, the cause may not be known. What increases the risk? This condition is more likely to develop in:  Men.  People who are 28-62 years old.  What are the signs or symptoms? Symptoms of this condition may include:  Abdominal pain. Pain may also be felt in the upper back and may get worse after eating.  Nausea and vomiting.  Fever.  Weight loss.  A change in the color and consistency of bowel movements, such as diarrhea.  How is this  diagnosed? This condition is diagnosed based on your symptoms, your medical history, and a physical exam. You may have tests, such as:  Blood tests.  Stool samples.  Biopsy of the pancreas. This is the removal of a small amount of pancreas tissue to be tested in a lab.  Imaging studies, such as: ? X-rays. ? CT scan. ? MRI. ? Ultrasound.  How is this treated? The goal of treatment is to help relieve symptoms and to prevent complications from occurring. Treatment focuses on:  Resting the pancreas. You may need to stop eating and drinking for a few days while in the hospital to give your pancreas time to recover. During this time, you will be given IV fluids to keep you hydrated.  Controlling pain. You may be given pain medicines by mouth (orally) or as injections.  Improving digestion. You may be given: ? Medicines to help balance your enzymes. ? Vitamin supplements. ? A specific diet to follow. If you are given a diet, you may work with a specialist (dietitian).  Preventing diabetes. You may need insulin injections.  You may have surgery to:  Clear the pancreatic ducts of any blockages, such as gallstones.  Remove any fluid or damaged tissue from the pancreas.  Follow these instructions at home:  Take over-the-counter and prescription medicines only as  told by your health care provider. This includes any vitamin supplements.  Do not drive or operate heavy machinery while taking prescription pain medicines.  Drink enough fluid to keep your urine clear or pale yellow.  Do not drink alcohol. If you need help quitting, ask your health care provider.  Do not use any tobacco products, such as cigarettes, chewing tobacco, and e-cigarettes. If you need help quitting, ask your health care provider.  Follow a diet as told by your health care provider or dietitian, if this applies. This may include: ? Limiting how much fat you eat. ? Eating smaller meals more often. ? Avoiding  caffeine.  Keep all follow-up visits as told by your health care provider. This is important. Contact a health care provider if:  You have pain that does not get better with medicine.  You have a fever. Get help right away if:  Your pain suddenly gets worse.  You have sudden abdominal swelling.  You start to vomit often or you vomit blood.  You have diarrhea that does not go away.  You have blood in your stool. This information is not intended to replace advice given to you by your health care provider. Make sure you discuss any questions you have with your health care provider. Document Released: 01/03/2016 Document Revised: 05/14/2016 Document Reviewed: 11/14/2014 Elsevier Interactive Patient Education  [employer].

## 2018-11-04 NOTE — Progress Notes (Signed)
Impression and Recommendations:    1. Acute on chronic pancreatitis ([place])   2. Diabetes mellitus due to underlying condition with diabetic nephropathy, with long-term current use of insulin ([place])   3. Flu vaccine need     - Need for flu vaccine today.  -Regarding pt's recent hospitalization and/or ED visit: reviewed in great detail recent hospitalization notes, clinical lab tests, tests in the radiology section of C[facility], tests in the medicine testing of [facility], and obtained history from family member. -Independent visualization of image, tracing, or specimen itself done by me today.   1. Recent Hospitalization due to Acute Pancreatitis - Educated patient about prudent habits while recovering from her hospitalization event.  Significant portion of appointment spent discussing healthy habits and answering patient questions about her hospital visit.  - To avoid aggravating her pancreatitis, advised patient to consume small foods, bland foods, veggies, and avoid fried foods, avoid fatty foods.  - Advised patient to keep a list of foods she can't eat, and pay close attention to this.  - Reviewed critical importance of minimizing exposure to foods that cause her pancreatitis to flare.  - Reviewed the need to go on bowel rest and NPO if she eats something aggravating.  If pancreatitis flares again by food consumed, patient knows to go 24 hours after with nothing by mouth, just consuming liquids.  - Strongly advised patient to follow up with GI.  - Need for lab work to assess follow-up from hospital visit.   2. Lifestyle & Preventative Health Maintenance - Advised patient to continue working toward exercising to improve overall mental, physical, and emotional health.    - Healthy dietary habits encouraged, including low-carb, and high amounts of lean protein in diet.   - Patient should also consume adequate amounts of water.  3. Follow-Up - Return for Diabetes Management as  scheduled. - Last HbA1c (09/19/2018) 6.4; under good control.  - Otherwise, continue to return for CPE and chronic follow-up as scheduled.  - Patient knows to call in sooner if desired to address acute concerns.      Orders Placed This Encounter  Procedures  . Flu vaccine HIGH DOSE PF (Fluzone High dose)  . Comprehensive metabolic panel  . CBC with Differential/Platelet  . Lipase    Gross side effects, risk and benefits, and alternatives of medications and treatment plan in general discussed with patient.  Patient is aware that all medications have potential side effects and we are unable to predict every side effect or drug-drug interaction that may occur.   Patient will call with any questions prior to using medication if they have concerns.    Expresses verbal understanding and consents to current therapy and treatment regimen.  No barriers to understanding were identified.  Red flag symptoms and signs discussed in detail.  Patient expressed understanding regarding what to do in case of emergency\urgent symptoms  Please see AVS handed out to patient at the end of our visit for further patient instructions/ counseling done pertaining to today's office visit.   Return if symptoms worsen or fail to improve, for F-up of current med issues as previously d/c pt.     Note:  This note was prepared with assistance of Dragon voice recognition software. Occasional wrong-word or sound-a-like substitutions may have occurred due to the inherent limitations of voice recognition software.   This document serves as a record of services personally performed by [name], DO. It was created on her behalf by Belenda Cruise  Adah Perl, a trained medical scribe. The creation of this record is based on the scribe's personal observations and the provider's statements to them.   I have reviewed the above medical documentation for accuracy and completeness and I concur.  [name], DO,  D.O. 11/04/2018 11:10 AM       ---------------------------------------------------------------------------------------------    Subjective:     HPI: Jasmine Davila is a 67 y.o. female who presents to [place] at [facility] today for issues as discussed below.  Acute Pancreatitis [facility] Visit Recently hospitalized for pancreatitis (10/18/2018-10/21/2018).  States that this is the sixth time this has happened to her.  Ate a sloppy joe the other day and ended up throwing up.  Did the soft diet for eight days after she got out of the hospital; notes that she ate plain grilled chicken once, had the sloppy joes the next day.  Confirms that the sloppy joes set her off.  Notes husband said "are you sure that's what you wanna do" when she was making the sloppy joe.  After that, she put herself back to soft foods, and feels her stomach has calmed down.  Husband says "she doesn't eat a lot of food."  She notes "even though I'm heavy, I really don't eat a lot."  Notes they had a bad experience at the hospital.  States the ER had 50 people in it, and they sat there and waited for eight hours.  Notes "I'm doubled over in pain, my pain is a 12 out of 10," and states they still had to wait for her to be triaged.  Husband notes that once she got through the ER, everything was okay.  Recovery after Hospitalization Patient confirms that she's been through this before, she knows what to eat and what not to eat.  Confirms that she's doing okay with liquids PO.  Bowels are back to normal.  No issues with urination, no cough or "crud" from the hospital.  Patient states "they didn't tell me to follow up with GI or anything."  Blood Sugars in Meantime States her sugars have been okay.  Husband notes "actually they've been fine."    Wt Readings from Last 3 Encounters:  11/04/18 231 lb 8 oz (105 kg)  10/18/18 235 lb (106.6 kg)  07/27/18 232 lb (105.2 kg)   BP Readings  from Last 3 Encounters:  11/04/18 109/72  10/22/18 (!) 162/84  07/27/18 120/68   Pulse Readings from Last 3 Encounters:  11/04/18 89  10/22/18 77  07/27/18 86   BMI Readings from Last 3 Encounters:  11/04/18 36.26 kg/m  10/18/18 36.81 kg/m  07/27/18 36.34 kg/m     Patient Care Team    Relationship Specialty Notifications Start End  [name], DO PCP - General Family Medicine  11/24/16   [name], MD Consulting Physician Oncology  11/24/16   Clarene Essex, MD Consulting Physician Gastroenterology  04/20/18   Alda Berthold, DO Consulting Physician Neurology  04/20/18   Edrick Kins, DPM Consulting Physician Podiatry  04/20/18      Patient Active Problem List   Diagnosis Date Noted  . Diabetes mellitus due to underlying condition with diabetic nephropathy, with long-term current use of insulin ([place]) 11/25/2016    Priority: High  . Hypertension associated with diabetes ([place]) 11/24/2016    Priority: High  . H/o Cancer:   B- cell lymphoma (spleen and lung) - non-Hodgkin's 11/24/2016    Priority: High  . Diabetic  neuropathy - exaccerbated by chemo 11/24/2016    Priority: High  . Hyperlipidemia associated with type 2 diabetes mellitus ([place]) 11/24/2016    Priority: High  . Endometrial disorder- trace endometrial canal fluid seen on CT scan recently and in 2016.  No GYN care many yrs, to GYN for TVUS and further w/up prn 04/20/2018    Priority: Medium  . Morbid obesity ([place]) 11/25/2016    Priority: Medium  . Smoking hx 11/24/2016    Priority: Medium  . Chronic pancreatitis ([place]) 11/24/2016    Priority: Medium  . Vitamin B6 deficiency (non anemic) 11/24/2016    Priority: Medium  . GERD (gastroesophageal reflux disease) 11/24/2016    Priority: Low  . B12 deficiency 11/24/2016    Priority: Low  . Hypertension   . Depression 10/18/2018  . History of malignant lymphoma 06/03/2018  . Neuropathy 06/03/2018  . Diabetic peripheral neuropathy associated with type  2 diabetes mellitus ([place]) 04/20/2018  . Acute on chronic pancreatitis ([place]) 04/13/2018  . Onychomycosis 03/16/2018  . Neuropathic pain syndrome (non-herpetic) 03/16/2018  . Sleep apnea with CPAP 12/29/2016  . History of decreased platelet count 12/29/2016    Past Medical history, Surgical history, Family history, Social history, Allergies and Medications have been entered into the medical record, reviewed and changed as needed.    Current Meds  Medication Sig  . aspirin EC 81 MG tablet Take 81 mg by mouth at bedtime.   . Cholecalciferol (VITAMIN D-3) 5000 units TABS Take 1 tablet by mouth daily.  . cyanocobalamin (,VITAMIN B-12,) 1000 MCG/ML injection Inject 0.5 mLs (500 mcg total) into the muscle every 30 (thirty) days.  . famotidine (PEPCID) 20 MG tablet TAKE 1 TABLET BY MOUTH 2 TIMES DAILY.  . fluticasone (FLONASE) 50 MCG/ACT nasal spray Place 1 spray into both nostrils daily as needed for allergies or rhinitis.  . folic acid (FOLVITE) 761 MCG tablet Take 400 mcg by mouth daily.  Marland Kitchen gabapentin (NEURONTIN) 300 MG capsule TAKE 4 CAPSULES BY MOUTH 3 TIMES DAILY. (Patient taking differently: Take 1,200 mg by mouth 3 (three) times daily. )  . HUMALOG KWIKPEN 100 UNIT/ML KiwkPen INJECT 20 UNITS INTO THE SKIN DAILY WITH LARGEST MEAL (Patient taking differently: Inject 20 Units into the skin daily. )  . LANTUS SOLOSTAR 100 UNIT/ML Solostar Pen INJECT 60 UNITS INTO THE SKIN EVERY MORNING. (Patient taking differently: Inject 60 Units into the skin daily. )  . losartan (COZAAR) 25 MG tablet TAKE 1 TABLET BY MOUTH DAILY.  . Multiple Vitamin (MULTIVITAMIN) tablet Take 1 tablet by mouth daily.  . niacin (NIASPAN) 1000 MG CR tablet Take 1 tablet (1,000 mg total) by mouth at bedtime. An hour after 81 mg ASA  . NONFORMULARY OR COMPOUNDED ITEM [facility]  Peripheral Neuropathy Cream- Bupivacaine 1%, Doxepin 3%, Gabapentin 6%, Pentoxifylline 3%, Topiramate 1% Apply 1-2 grams to affected area 3-4  times daily Qty. 120 gm 3 refills  . nortriptyline (PAMELOR) 10 MG capsule Start nortriptyline 10mg  at bedtime for 2 week, then increase to 2 tablet at bedtime (Patient taking differently: Take 10 mg by mouth at bedtime. )    Allergies:  Allergies  Allergen Reactions  . Dilaudid [Hydromorphone Hcl] Other (See Comments)    anxious  . Doxycycline Other (See Comments)    Pancreatic issues  . Metronidazole Other (See Comments)    Cream  Burned face  . Pseudoephedrine Hcl Other (See Comments)    Pt unsure     Review of Systems:  A  fourteen system review of systems was performed and found to be positive as per HPI.   Objective:   Blood pressure 109/72, pulse 89, temperature 98.6 F (37 C), height 5\' 7"  (1.702 m), weight 231 lb 8 oz (105 kg), last menstrual period 12/21/1998, SpO2 99 %. Body mass index is 36.26 kg/m. General:  Well Developed, well nourished, appropriate for stated age.  Neuro:  Alert and oriented,  extra-ocular muscles intact  HEENT:  Normocephalic, atraumatic, neck supple, no carotid bruits appreciated  Skin:  no gross rash, warm, pink. Cardiac:  RRR, S1 S2 Respiratory:  ECTA B/L and A/P, Not using accessory muscles, speaking in full sentences- unlabored. Vascular:  Ext warm, no cyanosis apprec.; cap RF less 2 sec. Psych:  No HI/SI, judgement and insight good, Euthymic mood. Full Affect.

## 2018-11-05 LAB — CBC WITH DIFFERENTIAL/PLATELET
BASOS ABS: 0.1 10*3/uL (ref 0.0–0.2)
Basos: 1 %
EOS (ABSOLUTE): 0.1 10*3/uL (ref 0.0–0.4)
Eos: 2 %
HEMOGLOBIN: 14.2 g/dL (ref 11.1–15.9)
Hematocrit: 42 % (ref 34.0–46.6)
IMMATURE GRANS (ABS): 0 10*3/uL (ref 0.0–0.1)
Immature Granulocytes: 0 %
LYMPHS: 29 %
Lymphocytes Absolute: 1.8 10*3/uL (ref 0.7–3.1)
MCH: 29.3 pg (ref 26.6–33.0)
MCHC: 33.8 g/dL (ref 31.5–35.7)
MCV: 87 fL (ref 79–97)
MONOCYTES: 5 %
Monocytes Absolute: 0.3 10*3/uL (ref 0.1–0.9)
NEUTROS ABS: 3.9 10*3/uL (ref 1.4–7.0)
Neutrophils: 63 %
Platelets: 221 10*3/uL (ref 150–450)
RBC: 4.85 x10E6/uL (ref 3.77–5.28)
RDW: 12.9 % (ref 12.3–15.4)
WBC: 6.1 10*3/uL (ref 3.4–10.8)

## 2018-11-05 LAB — COMPREHENSIVE METABOLIC PANEL WITH GFR
ALT: 33 IU/L — ABNORMAL HIGH (ref 0–32)
AST: 33 IU/L (ref 0–40)
Albumin/Globulin Ratio: 1.3 (ref 1.2–2.2)
Albumin: 4.5 g/dL (ref 3.6–4.8)
Alkaline Phosphatase: 109 IU/L (ref 39–117)
BUN/Creatinine Ratio: 12 (ref 12–28)
BUN: 9 mg/dL (ref 8–27)
Bilirubin Total: 0.5 mg/dL (ref 0.0–1.2)
CO2: 23 mmol/L (ref 20–29)
Calcium: 10.2 mg/dL (ref 8.7–10.3)
Chloride: 95 mmol/L — ABNORMAL LOW (ref 96–106)
Creatinine, Ser: 0.74 mg/dL (ref 0.57–1.00)
GFR calc Af Amer: 97 mL/min/1.73
GFR calc non Af Amer: 84 mL/min/1.73
Globulin, Total: 3.4 g/dL (ref 1.5–4.5)
Glucose: 184 mg/dL — ABNORMAL HIGH (ref 65–99)
Potassium: 4.6 mmol/L (ref 3.5–5.2)
Sodium: 135 mmol/L (ref 134–144)
Total Protein: 7.9 g/dL (ref 6.0–8.5)

## 2018-11-05 LAB — LIPASE: Lipase: 23 U/L (ref 14–72)

## 2018-11-23 ENCOUNTER — Ambulatory Visit (INDEPENDENT_AMBULATORY_CARE_PROVIDER_SITE_OTHER): Payer: Medicare Other | Admitting: Adult Health

## 2018-11-23 ENCOUNTER — Encounter: Payer: Self-pay | Admitting: Adult Health

## 2018-11-23 VITALS — BP 125/70 | HR 72 | Temp 98.5°F | Ht 67.0 in | Wt 234.0 lb

## 2018-11-23 DIAGNOSIS — H6690 Otitis media, unspecified, unspecified ear: Secondary | ICD-10-CM | POA: Insufficient documentation

## 2018-11-23 DIAGNOSIS — J0100 Acute maxillary sinusitis, unspecified: Secondary | ICD-10-CM | POA: Diagnosis not present

## 2018-11-23 DIAGNOSIS — K859 Acute pancreatitis without necrosis or infection, unspecified: Secondary | ICD-10-CM | POA: Diagnosis not present

## 2018-11-23 MED ORDER — BENZONATATE 200 MG PO CAPS: 200.0000 mg | ORAL_CAPSULE | Freq: Two times a day (BID) | ORAL | 0 refills | Status: DC | PRN

## 2018-11-23 MED ORDER — FLUTICASONE PROPIONATE 50 MCG/ACT NA SUSP: 1.0000 | Freq: Every day | NASAL | 1 refills | Status: DC | PRN

## 2018-11-23 MED ORDER — AMOXICILLIN-POT CLAVULANATE 875-125 MG PO TABS: 1.0000 | ORAL_TABLET | Freq: Two times a day (BID) | ORAL | 0 refills | Status: DC

## 2018-11-23 NOTE — Progress Notes (Signed)
Subjective:    Patient ID: Jasmine Davila, female    DOB: [birth year], 67 y.o.   MRN: [id number]  HPI:  MS. Cliburn presents with productive cough (green/mucus), clear/yellow nasal drainage, frontal HA (3/10, described as pressure), L ear pain (3/10), and extreme fatigue that started last week and has steadily been worsening. She states "and I lost my voice yesterday, no one can understand me" She denies fever/night sweats/chills/body aches/N/V/D She has been taking OTC Zyrtec and Mucinex with only minimal sx relief She denies tobacco/vape use She reports recurrent sinusiti, esp during seasonal weather changes   Patient Care Team    Relationship Specialty Notifications Start End  [name], DO PCP - General Family Medicine  11/24/16   [name], MD Consulting Physician Oncology  11/24/16   Clarene Essex, MD Consulting Physician Gastroenterology  04/20/18   Alda Berthold, DO Consulting Physician Neurology  04/20/18   Edrick Kins, DPM Consulting Physician Podiatry  04/20/18     Patient Active Problem List   Diagnosis Date Noted  . Acute otitis media 11/23/2018  . Acute maxillary sinusitis 11/23/2018  . Hypertension   . Depression 10/18/2018  . History of malignant lymphoma 06/03/2018  . Neuropathy 06/03/2018  . Endometrial disorder- trace endometrial canal fluid seen on CT scan recently and in 2016.  No GYN care many yrs, to GYN for TVUS and further w/up prn 04/20/2018  . Diabetic peripheral neuropathy associated with type 2 diabetes mellitus ([place]) 04/20/2018  . Acute on chronic pancreatitis ([place]) 04/13/2018  . Onychomycosis 03/16/2018  . Neuropathic pain syndrome (non-herpetic) 03/16/2018  . Sleep apnea with CPAP 12/29/2016  . History of decreased platelet count 12/29/2016  . Morbid obesity ([place]) 11/25/2016  . Diabetes mellitus due to underlying condition with diabetic nephropathy, with long-term current use of insulin ([place]) 11/25/2016  . Smoking hx  11/24/2016  . Hypertension associated with diabetes ([place]) 11/24/2016  . Chronic pancreatitis ([place]) 11/24/2016  . 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 Diabetic neuropathy - exaccerbated by chemo 11/24/2016  . GERD (gastroesophageal reflux disease) 11/24/2016  . Hyperlipidemia associated with type 2 diabetes mellitus ([place]) 11/24/2016  . B12 deficiency 11/24/2016  . Vitamin B6 deficiency (non anemic) 11/24/2016     Past Medical History:  Diagnosis Date  . Abnormal EKG    hx of right bundle branch block  . Acute pancreatitis    HX OF  . Anemia yrs ago  . Cancer ([place]) dx 2016   B cell lymphoma- Non Hodgkins in remission  . Diabetes mellitus ([place])    TYPE 2  . Diabetic neuropathy ([place])   . Diabetic neuropathy ([place])   . Endometrial disorder   . GERD (gastroesophageal reflux disease)   . Headache    tension or sinus  . Hypertension   . Malignant lymphoma ([place])   . Morbid obesity ([place])   . Peripheral neuropathy    BOTH FEET, SLIGHT NEUROPATHY IN HANDS  . Sleep apnea    USES CPAP at times  . Vitamin B 12 deficiency      Past Surgical History:  Procedure Laterality Date  . CHOLECYSTECTOMY  03/06/1996  . colomscopy    . DILATATION & CURETTAGE/HYSTEROSCOPY WITH MYOSURE N/A 07/04/2018   Procedure: DILATATION & CURETTAGE/HYSTEROSCOPY WITH MYOSURE;  Surgeon: Salvadore Dom, MD;  [facility];  Service: Gynecology;  Laterality: N/A;  . MEDIPORT INSERTION, DOUBLE    . MEDIPORT REMOVAL  Family History  Problem Relation Age of Onset  . Cancer Mother 47       colon  . Hypertension Mother   . Osteoporosis Mother   . Heart attack Father   . Heart disease Sister   . Alcohol abuse Maternal Grandfather      Social History   Substance and Sexual Activity  Drug Use No     Social History   Substance and Sexual Activity  Alcohol Use No     Social History   Tobacco Use  Smoking Status Former  Smoker  . Packs/day: 0.50  . Years: 4.00  . Pack years: 2.00  . Types: Cigarettes  . Last attempt to quit: 12/21/1968  . Years since quitting: 49.9  Smokeless Tobacco Never Used  Tobacco Comment   as teenager     Outpatient Encounter Medications as of 11/23/2018  Medication Sig  . aspirin EC 81 MG tablet Take 81 mg by mouth at bedtime.   . Cholecalciferol (VITAMIN D-3) 5000 units TABS Take 1 tablet by mouth daily.  . cyanocobalamin (,VITAMIN B-12,) 1000 MCG/ML injection Inject 0.5 mLs (500 mcg total) into the muscle every 30 (thirty) days.  . famotidine (PEPCID) 20 MG tablet TAKE 1 TABLET BY MOUTH 2 TIMES DAILY.  . fluticasone (FLONASE) 50 MCG/ACT nasal spray Place 1 spray into both nostrils daily as needed for allergies or rhinitis.  . folic acid (FOLVITE) 654 MCG tablet Take 400 mcg by mouth daily.  Marland Kitchen gabapentin (NEURONTIN) 300 MG capsule TAKE 4 CAPSULES BY MOUTH 3 TIMES DAILY. (Patient taking differently: Take 1,200 mg by mouth 3 (three) times daily. )  . HUMALOG KWIKPEN 100 UNIT/ML KiwkPen INJECT 20 UNITS INTO THE SKIN DAILY WITH LARGEST MEAL (Patient taking differently: Inject 20 Units into the skin daily. )  . LANTUS SOLOSTAR 100 UNIT/ML Solostar Pen INJECT 60 UNITS INTO THE SKIN EVERY MORNING. (Patient taking differently: Inject 60 Units into the skin daily. )  . losartan (COZAAR) 25 MG tablet TAKE 1 TABLET BY MOUTH DAILY.  . Multiple Vitamin (MULTIVITAMIN) tablet Take 1 tablet by mouth daily.  . niacin (NIASPAN) 1000 MG CR tablet Take 1 tablet (1,000 mg total) by mouth at bedtime. An hour after 81 mg ASA  . NONFORMULARY OR COMPOUNDED ITEM [facility]  Peripheral Neuropathy Cream- Bupivacaine 1%, Doxepin 3%, Gabapentin 6%, Pentoxifylline 3%, Topiramate 1% Apply 1-2 grams to affected area 3-4 times daily Qty. 120 gm 3 refills  . nortriptyline (PAMELOR) 10 MG capsule Start nortriptyline 10mg  at bedtime for 2 week, then increase to 2 tablet at bedtime (Patient taking  differently: Take 10 mg by mouth at bedtime. )  . [DISCONTINUED] fluticasone (FLONASE) 50 MCG/ACT nasal spray Place 1 spray into both nostrils daily as needed for allergies or rhinitis.  Marland Kitchen amoxicillin-clavulanate (AUGMENTIN) 875-125 MG tablet Take 1 tablet by mouth 2 (two) times daily.  . benzonatate (TESSALON) 200 MG capsule Take 1 capsule (200 mg total) by mouth 2 (two) times daily as needed for cough.   No facility-administered encounter medications on file as of 11/23/2018.     Allergies: Dilaudid [hydromorphone hcl]; Doxycycline; Metronidazole; and Pseudoephedrine hcl  Body mass index is 36.65 kg/m.  Blood pressure 125/70, pulse 72, temperature 98.5 F (36.9 C), temperature source Oral, height 5\' 7"  (1.702 m), weight 234 lb (106.1 kg), last menstrual period 12/21/1998, SpO2 99 %.     Review of Systems  Constitutional: Positive for activity change and fatigue. Negative for appetite change, chills, diaphoresis, fever and  unexpected weight change.  HENT: Positive for congestion, ear pain, facial swelling, sinus pressure, sinus pain, sore throat and voice change.   Eyes: Negative for visual disturbance.  Respiratory: Positive for cough. Negative for chest tightness, shortness of breath, wheezing and stridor.   Cardiovascular: Negative for chest pain, palpitations and leg swelling.  Gastrointestinal: Negative for abdominal distention, anal bleeding, blood in stool, constipation, diarrhea, nausea and vomiting.  Neurological: Positive for headaches. Negative for dizziness.  Psychiatric/Behavioral: Positive for sleep disturbance.       Objective:   Physical Exam  Constitutional: She is oriented to person, place, and time. She appears well-developed and well-nourished. No distress.  HENT:  Head: Normocephalic and atraumatic.  Right Ear: External ear normal. Tympanic membrane is bulging. Tympanic membrane is not erythematous. No decreased hearing is noted.  Left Ear: External ear  normal. Tympanic membrane is erythematous and bulging. No decreased hearing is noted.  Nose: Mucosal edema and rhinorrhea present. Right sinus exhibits frontal sinus tenderness. Right sinus exhibits no maxillary sinus tenderness. Left sinus exhibits frontal sinus tenderness. Left sinus exhibits no maxillary sinus tenderness.  Mouth/Throat: Uvula is midline and mucous membranes are normal. Posterior oropharyngeal edema and posterior oropharyngeal erythema present. No oropharyngeal exudate or tonsillar abscesses. Tonsils are 0 on the right. Tonsils are 0 on the left.  Eyes: Pupils are equal, round, and reactive to light. Conjunctivae and EOM are normal.  Neck: Normal range of motion. Neck supple.  Cardiovascular: Normal rate, regular rhythm, normal heart sounds and intact distal pulses.  No murmur heard. Pulmonary/Chest: Effort normal and breath sounds normal. No stridor. No respiratory distress. She has no wheezes. She has no rales. She exhibits no tenderness.  Lymphadenopathy:    She has no cervical adenopathy.  Neurological: She is alert and oriented to person, place, and time.  Skin: Skin is warm and dry. Capillary refill takes less than 2 seconds. No rash noted. She is not diaphoretic. No erythema. No pallor.  Psychiatric: She has a normal mood and affect. Her behavior is normal. Judgment and thought content normal.  Nursing note and vitals reviewed.     Assessment & Plan:   1. Acute maxillary sinusitis, recurrence not specified   2. Acute otitis media, unspecified otitis media type     Acute otitis media Please take Augmenting and Flonase as directed. Please take Teassalon as needed. Continue to push fluids/rest/vit c-2,000mg /day. Recommend alternating OTC Acetaminophen and Ibuprofen as needed for fever/discomfort. If symptoms persist after antibiotic completed, then please call clinic.  Acute maxillary sinusitis Please take Augmenting and Flonase as directed. Please take Teassalon  as needed. Continue to push fluids/rest/vit c-2,000mg /day. Recommend alternating OTC Acetaminophen and Ibuprofen as needed for fever/discomfort. If symptoms persist after antibiotic completed, then please call clinic.    FOLLOW-UP:  Return if symptoms worsen or fail to improve.

## 2018-11-23 NOTE — Assessment & Plan Note (Signed)
Please take Augmenting and Flonase as directed. Please take Teassalon as needed. Continue to push fluids/rest/vit c-2,000mg /day. Recommend alternating OTC Acetaminophen and Ibuprofen as needed for fever/discomfort. If symptoms persist after antibiotic completed, then please call clinic.

## 2018-11-23 NOTE — Patient Instructions (Addendum)
Sinusitis, Adult Sinusitis is soreness and inflammation of your sinuses. Sinuses are hollow spaces in the bones around your face. They are located:  Around your eyes.  In the middle of your forehead.  Behind your nose.  In your cheekbones.  Your sinuses and nasal passages are lined with a stringy fluid (mucus). Mucus normally drains out of your sinuses. When your nasal tissues get inflamed or swollen, the mucus can get trapped or blocked so air cannot flow through your sinuses. This lets bacteria, viruses, and funguses grow, and that leads to infection. Follow these instructions at home: Medicines  Take, use, or apply over-the-counter and prescription medicines only as told by your doctor. These may include nasal sprays.  If you were prescribed an antibiotic medicine, take it as told by your doctor. Do not stop taking the antibiotic even if you start to feel better. Hydrate and Humidify  Drink enough water to keep your pee (urine) clear or pale yellow.  Use a cool mist humidifier to keep the humidity level in your home above 50%.  Breathe in steam for 10-15 minutes, 3-4 times a day or as told by your doctor. You can do this in the bathroom while a hot shower is running.  Try not to spend time in cool or dry air. Rest  Rest as much as possible.  Sleep with your head raised (elevated).  Make sure to get enough sleep each night. General instructions  Put a warm, moist washcloth on your face 3-4 times a day or as told by your doctor. This will help with discomfort.  Wash your hands often with soap and water. If there is no soap and water, use hand sanitizer.  Do not smoke. Avoid being around people who are smoking (secondhand smoke).  Keep all follow-up visits as told by your doctor. This is important. Contact a doctor if:  You have a fever.  Your symptoms get worse.  Your symptoms do not get better within 10 days. Get help right away if:  You have a very bad  headache.  You cannot stop throwing up (vomiting).  You have pain or swelling around your face or eyes.  You have trouble seeing.  You feel confused.  Your neck is stiff.  You have trouble breathing. This information is not intended to replace advice given to you by your health care provider. Make sure you discuss any questions you have with your health care provider. Document Released: 05/25/2008 Document Revised: 08/02/2016 Document Reviewed: 10/02/2015 Elsevier Interactive Patient Education  2018 [employer].   Otitis Media, Adult Otitis media occurs when there is inflammation and fluid in the middle ear. Your middle ear is a part of the ear that contains bones for hearing as well as air that helps send sounds to your brain. What are the causes? This condition is caused by a blockage in the eustachian tube. This tube drains fluid from the ear to the back of the nose (nasopharynx). A blockage in this tube can be caused by an object or by swelling (edema) in the tube. Problems that can cause a blockage include:  A cold or other upper respiratory infection.  Allergies.  An irritant, such as tobacco smoke.  Enlarged adenoids. The adenoids are areas of soft tissue located high in the back of the throat, behind the nose and the roof of the mouth.  A mass in the nasopharynx.  Damage to the ear caused by pressure changes (barotrauma).  What are the signs or  symptoms? Symptoms of this condition include:  Ear pain.  A fever.  Decreased hearing.  A headache.  Tiredness (lethargy).  Fluid leaking from the ear.  Ringing in the ear.  How is this diagnosed? This condition is diagnosed with a physical exam. During the exam your health care provider will use an instrument called an otoscope to look into your ear and check for redness, swelling, and fluid. He or she will also ask about your symptoms. Your health care provider may also order tests, such as:  A test to check  the movement of the eardrum (pneumatic otoscopy). This test is done by squeezing a small amount of air into the ear.  A test that changes air pressure in the middle ear to check how well the eardrum moves and whether the eustachian tube is working (tympanogram).  How is this treated? This condition usually goes away on its own within 3-5 days. But if the condition is caused by a bacteria infection and does not go away own its own, or keeps coming back, your health care provider may:  Prescribe antibiotic medicines to treat the infection.  Prescribe or recommend medicines to control pain.  Follow these instructions at home:  Take over-the-counter and prescription medicines only as told by your health care provider.  If you were prescribed an antibiotic medicine, take it as told by your health care provider. Do not stop taking the antibiotic even if you start to feel better.  Keep all follow-up visits as told by your health care provider. This is important. Contact a health care provider if:  You have bleeding from your nose.  There is a lump on your neck.  You are not getting better in 5 days.  You feel worse instead of better. Get help right away if:  You have severe pain that is not controlled with medicine.  You have swelling, redness, or pain around your ear.  You have stiffness in your neck.  A part of your face is paralyzed.  The bone behind your ear (mastoid) is tender when you touch it.  You develop a severe headache. Summary  Otitis media is redness, soreness, and swelling of the middle ear.  This condition usually goes away on its own within 3-5 days.  If the problem does not go away in 3-5 days, your health care provider may prescribe or recommend medicines to treat your symptoms.  If you were prescribed an antibiotic medicine, take it as told by your health care provider. This information is not intended to replace advice given to you by your health care  provider. Make sure you discuss any questions you have with your health care provider. Document Released: 09/11/2004 Document Revised: 11/27/2016 Document Reviewed: 11/27/2016 Elsevier Interactive Patient Education  2018 [employer].  Please take Augmenting and Flonase as directed. Please take Teassalon as needed. Continue to push fluids/rest/vit c-2,000mg /day. Recommend alternating OTC Acetaminophen and Ibuprofen as needed for fever/discomfort. If symptoms persist after antibiotic completed, then please call clinic. FEEL BETTER!

## 2018-12-05 ENCOUNTER — Ambulatory Visit (INDEPENDENT_AMBULATORY_CARE_PROVIDER_SITE_OTHER): Payer: Medicare Other | Admitting: Neurology

## 2018-12-05 ENCOUNTER — Encounter: Payer: Self-pay | Admitting: Neurology

## 2018-12-05 VITALS — BP 140/80 | HR 80 | Ht 67.0 in | Wt 236.0 lb

## 2018-12-05 DIAGNOSIS — E1142 Type 2 diabetes mellitus with diabetic polyneuropathy: Secondary | ICD-10-CM | POA: Diagnosis not present

## 2018-12-05 DIAGNOSIS — G5793 Unspecified mononeuropathy of bilateral lower limbs: Secondary | ICD-10-CM | POA: Diagnosis not present

## 2018-12-05 NOTE — Patient Instructions (Addendum)
You can try over the counter lidocaine cream (Salonpas, Aspercream)  Referral to [facility] in [place] medications as you are taking  Use a cane for uneven ground  Check feet daily  If symptoms get worse, please come back and see me

## 2018-12-05 NOTE — Progress Notes (Signed)
Follow-up Visit   Date: 12/05/18    Jasmine Davila MRN: [id number] DOB: 1951-01-15   Interim History: Jasmine Davila is a 67 y.o. right-handedfemalewith hypertension, well-controlled diabetes mellitus (TKP5W 6.4) complicated by nephropathy and neuropathy, history of B-cell lymphoma, and hyperlipidemia returning to the clinic for follow-up of neuropathy.  The patient was accompanied to the clinic by self.   History of present illness: Starting around 2012, she began having numbness and tingling in the soles of the feet which was more of a nuisance than painful. In 2016, she had chemotherapy in 2016 for B-cell Lymphoma and felt that symptoms intensified and started to involve the lower legs, fingers, and the burning pain was worse. She complains of difficulty opening jars and bottles. No weakness of the feet. She had bilateral CTS release in 2008. She does not complain of imbalance and walks unassisted.She has been on gabapentin 1286m three times daily, previously taking 16031mthree times daily. She also has a compound cream which helps a little. She has some relief with this dose, but continues to have burning pain.   UPDATE 08/30/2018:  She is here for ongoing paresthesias are hands and feet.she continues to take gabapentin 1200 mg 3 times a day, which is the maximum dose. Additionally, should start her on nortriptyline 20 mg at bedtime which she is tolerating.  UPDATE 12/05/2018:  She is here for follow-up visit.  She was evaluated by rheumatology for elevated RF factor and did not find clinical evidence of RA, mentioning that patients with history of B-cell lymphoma can have nonspecific elevation in RF.  She continues to have burning pain in the feet and lower legs despite being on gabapentin 120013mID.  She could not tolerate nortriptyline 2m70me to dizziness and sedation.  She has mild paresthesias of the hands.  No weakness of the hands and feet.  No falls  and she walks unassisted.  She did not tolerate Cymbalta or amitriptyline due to side effects.    She has constant sensation as if she is wearing work boots because it feels heavy.  She is very concerned about medication side effects due to recurrent bouts of pancreatitis, most recently hospitalized in November.    Medications:  Current Outpatient Medications on File Prior to Visit  Medication Sig Dispense Refill  . amoxicillin-clavulanate (AUGMENTIN) 875-125 MG tablet Take 1 tablet by mouth 2 (two) times daily. 20 tablet 0  . aspirin EC 81 MG tablet Take 81 mg by mouth at bedtime.     . benzonatate (TESSALON) 200 MG capsule Take 1 capsule (200 mg total) by mouth 2 (two) times daily as needed for cough. 20 capsule 0  . Cholecalciferol (VITAMIN D-3) 5000 units TABS Take 1 tablet by mouth daily.    . cyanocobalamin (,VITAMIN B-12,) 1000 MCG/ML injection Inject 0.5 mLs (500 mcg total) into the muscle every 30 (thirty) days. 10 mL 1  . famotidine (PEPCID) 20 MG tablet TAKE 1 TABLET BY MOUTH 2 TIMES DAILY. 180 tablet 1  . fluticasone (FLONASE) 50 MCG/ACT nasal spray Place 1 spray into both nostrils daily as needed for allergies or rhinitis. 16 g 1  . folic acid (FOLVITE) 400 656 tablet Take 400 mcg by mouth daily.    . gaMarland Kitchenapentin (NEURONTIN) 300 MG capsule TAKE 4 CAPSULES BY MOUTH 3 TIMES DAILY. (Patient taking differently: Take 1,200 mg by mouth 3 (three) times daily. ) 360 capsule 5  . HUMALOG KWIKPEN 100 UNIT/ML KiwkPen INJECT 20 UNITS INTO  THE SKIN DAILY WITH LARGEST MEAL (Patient taking differently: Inject 20 Units into the skin daily. ) 15 mL 1  . LANTUS SOLOSTAR 100 UNIT/ML Solostar Pen INJECT 60 UNITS INTO THE SKIN EVERY MORNING. (Patient taking differently: Inject 60 Units into the skin daily. ) 30 mL 1  . losartan (COZAAR) 25 MG tablet TAKE 1 TABLET BY MOUTH DAILY. 90 tablet 1  . Multiple Vitamin (MULTIVITAMIN) tablet Take 1 tablet by mouth daily.    . niacin (NIASPAN) 1000 MG CR tablet Take  1 tablet (1,000 mg total) by mouth at bedtime. An hour after 81 mg ASA 90 tablet 3  . NONFORMULARY OR COMPOUNDED ITEM [facility]  Peripheral Neuropathy Cream- Bupivacaine 1%, Doxepin 3%, Gabapentin 6%, Pentoxifylline 3%, Topiramate 1% Apply 1-2 grams to affected area 3-4 times daily Qty. 120 gm 3 refills 1 each 2  . nortriptyline (PAMELOR) 10 MG capsule Start nortriptyline 32m at bedtime for 2 week, then increase to 2 tablet at bedtime (Patient taking differently: Take 10 mg by mouth at bedtime. ) 60 capsule 5  . ONE TOUCH ULTRA TEST test strip USE 1 STRIP TO CHECK GLUCOSE THREE TIMES DAILY AS NEEDED  5   No current facility-administered medications on file prior to visit.     Allergies:  Allergies  Allergen Reactions  . Dilaudid [Hydromorphone Hcl] Other (See Comments)    anxious  . Doxycycline Other (See Comments)    Pancreatic issues  . Metronidazole Other (See Comments)    Cream  Burned face  . Pseudoephedrine Hcl Other (See Comments)    Pt unsure    Review of Systems:  CONSTITUTIONAL: No fevers, chills, night sweats, or weight loss.  EYES: No visual changes or eye pain ENT: No hearing changes.  No history of nose bleeds.   RESPIRATORY: No cough, wheezing and shortness of breath.   CARDIOVASCULAR: Negative for chest pain, and palpitations.   GI: Negative for abdominal discomfort, blood in stools or black stools.  No recent change in bowel habits.   GU:  No history of incontinence.   MUSCLOSKELETAL: +history of joint pain or swelling.  No myalgias.   SKIN: Negative for lesions, rash, and itching.   ENDOCRINE: Negative for cold or heat intolerance, polydipsia or goiter.   PSYCH:  No depression or anxiety symptoms.   NEURO: As Above.   Vital Signs:  BP 140/80   Pulse 80   Ht _0  (1.702 m)   Wt 236 lb (107 kg)   LMP 12/21/1998 (Approximate)   SpO2 98%   BMI 36.96 kg/m    General Medical Exam:   General:  Well appearing, comfortable  Eyes/ENT: see cranial  nerve examination.   Neck:  Full range of motion without tenderness.  No carotid bruits. Respiratory:  Clear to auscultation, good air entry bilaterally.   Cardiac:  Regular rate and rhythm, no murmur.   Ext:  No edema  Neurological Exam: MENTAL STATUS including orientation to time, place, person, recent and remote memory, attention span and concentration, language, and fund of knowledge is normal.  Speech is not dysarthric.  CRANIAL NERVES:  Pupils equal round and reactive to light.  Normal conjugate, extra-ocular eye movements in all directions of gaze.  Mild right ptosis.  Face is symmetric. Palate elevates symmetrically.  Tongue is midline.  MOTOR:  Motor strength is 5/5 in all extremities, except distally in the feet (5-/5).  No atrophy, fasciculations or abnormal movements.  No pronator drift.  Tone is normal.  MSRs:  Reflexes are 2+/4 throughout, except absent Achilles bilaterally.  SENSORY:  Vibration is absent distal to the ankles bilaterally.  Temperature is reduced in a gradient pattern distal to mid-calf bilaterally.  COORDINATION/GAIT:   Intact rapid alternating movements bilaterally.  Gait is slow, antalgic appearing, unassisted and overall stable.  Data: NCS/EMG of the right arm and leg 08/30/2018:  The electrophysiologic findings are most consistent with a length-dependent, active on chronic predominately axonal sensorimotor polyneuropathy affecting the right upper and lower extremities.  A superimposed right L4 radiculopathy cannot be excluded.  MRI lumbar spine 09/15/2018:  L5-S1 degenerative left foraminal impingement and moderate right foraminal narrowing. The canal is diffusely patent.  Labs 08/30/2018:  CRP 14.1*, ESR 43*, SPEP with faint IgG lambda monoclonal protein  IMPRESSION/PLAN: Distal and symmetric painful neuropathy, likely due to chemotherapy and to a lesser degree diabetes. NCS/EMG shows predominately severe predominately axonal  polyneuropathy affecting  arms and legs, with active changes below the knee. Imaging shows degenerative changes at L5-S1 moderate with foraminal stenosis, no nerve root enhancement.  These findings are too limited to explain the diffuse nature of her neuropathic pain and lack of nerve root enhancement argues against polyradiculoneuropathy.  Extensive laboratory testing showed elevated rheumatoid factor, otherwise normal.  Rheumatolgy evaluation for rheumatoid disease was negative.   At this point, management is symptomatic.  She continues to have severe painful paresthesias, despite being on gabapentin 1246m TID and notriptyline 170mat bedtime.  Titration of nortriptyline is limited by side effects.  She has tried Cymbalta and amitriptyline in the past. I will refer her to pain management, she prefers [employer] precautions, including using a cane for uneven ground, was discussed Educated on daily foot inspection  Return to clinic as needed  Thank you for allowing me to participate in patient's care.  If I can answer any additional questions, I would be pleased to do so.    Sincerely,    Donika K. PaPosey ProntoDO

## 2019-01-04 ENCOUNTER — Telehealth: Payer: Self-pay | Admitting: *Deleted

## 2019-01-04 NOTE — Telephone Encounter (Signed)
Appointment set up with Dr. Wess Botts at [facility] on 12/16/2018 10:30 am.

## 2019-01-12 ENCOUNTER — Other Ambulatory Visit: Payer: Self-pay | Admitting: Family Medicine

## 2019-01-14 ENCOUNTER — Other Ambulatory Visit: Payer: Self-pay | Admitting: Neurology

## 2019-01-16 DIAGNOSIS — K861 Other chronic pancreatitis: Secondary | ICD-10-CM | POA: Diagnosis not present

## 2019-01-16 DIAGNOSIS — M47816 Spondylosis without myelopathy or radiculopathy, lumbar region: Secondary | ICD-10-CM | POA: Diagnosis not present

## 2019-01-16 DIAGNOSIS — G893 Neoplasm related pain (acute) (chronic): Secondary | ICD-10-CM | POA: Diagnosis not present

## 2019-01-16 DIAGNOSIS — G609 Hereditary and idiopathic neuropathy, unspecified: Secondary | ICD-10-CM | POA: Diagnosis not present

## 2019-01-16 DIAGNOSIS — Z5181 Encounter for therapeutic drug level monitoring: Secondary | ICD-10-CM | POA: Diagnosis not present

## 2019-01-16 DIAGNOSIS — Z79899 Other long term (current) drug therapy: Secondary | ICD-10-CM | POA: Diagnosis not present

## 2019-01-17 ENCOUNTER — Other Ambulatory Visit: Payer: Self-pay | Admitting: Anesthesiology

## 2019-01-17 ENCOUNTER — Other Ambulatory Visit (HOSPITAL_COMMUNITY): Payer: Self-pay | Admitting: Anesthesiology

## 2019-01-17 ENCOUNTER — Other Ambulatory Visit: Payer: Self-pay | Admitting: Radiology

## 2019-01-17 DIAGNOSIS — G609 Hereditary and idiopathic neuropathy, unspecified: Secondary | ICD-10-CM

## 2019-01-19 ENCOUNTER — Ambulatory Visit: Payer: Medicare Other | Admitting: Family Medicine

## 2019-01-20 ENCOUNTER — Ambulatory Visit: Payer: Medicare Other | Admitting: Family Medicine

## 2019-01-23 ENCOUNTER — Other Ambulatory Visit: Payer: Self-pay | Admitting: Family Medicine

## 2019-01-24 ENCOUNTER — Ambulatory Visit (HOSPITAL_COMMUNITY)
Admission: RE | Admit: 2019-01-24 | Discharge: 2019-01-24 | Disposition: A | Discharge status: Home / Self Care | Payer: Medicare Other | Source: Ambulatory Visit | Attending: Anesthesiology | Admitting: Anesthesiology

## 2019-01-24 DIAGNOSIS — G609 Hereditary and idiopathic neuropathy, unspecified: Secondary | ICD-10-CM | POA: Diagnosis not present

## 2019-01-24 DIAGNOSIS — M4724 Other spondylosis with radiculopathy, thoracic region: Secondary | ICD-10-CM | POA: Diagnosis not present

## 2019-01-24 DIAGNOSIS — M5114 Intervertebral disc disorders with radiculopathy, thoracic region: Secondary | ICD-10-CM | POA: Diagnosis not present

## 2019-01-27 ENCOUNTER — Other Ambulatory Visit: Payer: Self-pay | Admitting: Adult Health

## 2019-01-31 DIAGNOSIS — I700 Atherosclerosis of aorta: Secondary | ICD-10-CM | POA: Diagnosis not present

## 2019-01-31 DIAGNOSIS — C8339 Diffuse large B-cell lymphoma, extranodal and solid organ sites: Secondary | ICD-10-CM | POA: Diagnosis not present

## 2019-01-31 DIAGNOSIS — C8330 Diffuse large B-cell lymphoma, unspecified site: Secondary | ICD-10-CM | POA: Diagnosis not present

## 2019-01-31 LAB — BASIC METABOLIC PANEL
BUN: 13 (ref 4–21)
Creatinine: 0.7 (ref 0.5–1.1)
Glucose: 123
Potassium: 4.2 (ref 3.4–5.3)
Sodium: 133 — AB (ref 137–147)

## 2019-01-31 LAB — HEPATIC FUNCTION PANEL
ALT: 34 (ref 7–35)
AST: 39 — AB (ref 13–35)
Alkaline Phosphatase: 99 (ref 25–125)
Bilirubin, Total: 1.2

## 2019-01-31 LAB — CBC AND DIFFERENTIAL
HCT: 41 (ref 36–46)
Hemoglobin: 14.1 (ref 12.0–16.0)
Platelets: 159 (ref 150–399)
WBC: 7

## 2019-02-02 DIAGNOSIS — Z1231 Encounter for screening mammogram for malignant neoplasm of breast: Secondary | ICD-10-CM | POA: Diagnosis not present

## 2019-02-02 DIAGNOSIS — C8339 Diffuse large B-cell lymphoma, extranodal and solid organ sites: Secondary | ICD-10-CM | POA: Diagnosis not present

## 2019-02-02 DIAGNOSIS — Z8572 Personal history of non-Hodgkin lymphomas: Secondary | ICD-10-CM | POA: Diagnosis not present

## 2019-02-02 LAB — HM MAMMOGRAPHY

## 2019-02-07 ENCOUNTER — Ambulatory Visit (INDEPENDENT_AMBULATORY_CARE_PROVIDER_SITE_OTHER): Payer: Medicare Other | Admitting: Family Medicine

## 2019-02-07 ENCOUNTER — Encounter: Payer: Self-pay | Admitting: Family Medicine

## 2019-02-07 VITALS — BP 123/77 | HR 58 | Temp 97.8°F | Ht 67.0 in | Wt 240.0 lb

## 2019-02-07 DIAGNOSIS — L918 Other hypertrophic disorders of the skin: Secondary | ICD-10-CM | POA: Diagnosis not present

## 2019-02-07 DIAGNOSIS — E559 Vitamin D deficiency, unspecified: Secondary | ICD-10-CM

## 2019-02-07 DIAGNOSIS — M549 Dorsalgia, unspecified: Secondary | ICD-10-CM

## 2019-02-07 DIAGNOSIS — E785 Hyperlipidemia, unspecified: Secondary | ICD-10-CM

## 2019-02-07 DIAGNOSIS — Z794 Long term (current) use of insulin: Secondary | ICD-10-CM | POA: Diagnosis not present

## 2019-02-07 DIAGNOSIS — G8929 Other chronic pain: Secondary | ICD-10-CM

## 2019-02-07 DIAGNOSIS — K861 Other chronic pancreatitis: Secondary | ICD-10-CM

## 2019-02-07 DIAGNOSIS — Z0100 Encounter for examination of eyes and vision without abnormal findings: Secondary | ICD-10-CM | POA: Diagnosis not present

## 2019-02-07 DIAGNOSIS — E1159 Type 2 diabetes mellitus with other circulatory complications: Secondary | ICD-10-CM

## 2019-02-07 DIAGNOSIS — E1142 Type 2 diabetes mellitus with diabetic polyneuropathy: Secondary | ICD-10-CM

## 2019-02-07 DIAGNOSIS — E2839 Other primary ovarian failure: Secondary | ICD-10-CM | POA: Diagnosis not present

## 2019-02-07 DIAGNOSIS — Z23 Encounter for immunization: Secondary | ICD-10-CM | POA: Diagnosis not present

## 2019-02-07 DIAGNOSIS — I10 Essential (primary) hypertension: Secondary | ICD-10-CM

## 2019-02-07 DIAGNOSIS — I152 Hypertension secondary to endocrine disorders: Secondary | ICD-10-CM

## 2019-02-07 DIAGNOSIS — E1169 Type 2 diabetes mellitus with other specified complication: Secondary | ICD-10-CM | POA: Diagnosis not present

## 2019-02-07 DIAGNOSIS — E119 Type 2 diabetes mellitus without complications: Secondary | ICD-10-CM | POA: Diagnosis not present

## 2019-02-07 DIAGNOSIS — E0821 Diabetes mellitus due to underlying condition with diabetic nephropathy: Secondary | ICD-10-CM | POA: Diagnosis not present

## 2019-02-07 MED ORDER — TETANUS-DIPHTH-ACELL PERTUSSIS 5-2.5-18.5 LF-MCG/0.5 IM SUSP: 0.5000 mL | Freq: Once | INTRAMUSCULAR | 0 refills | Status: AC

## 2019-02-07 NOTE — Patient Instructions (Signed)
Lantus dose once daily as basal insulin.  Please add Humalog: - 10 units before a smaller meal - 15 units before a larger meal  Add the following Sliding Scale: - 150-175: + 1 unit  - 176-200: + 2 units  - 201-225: + 3 units  - 226-250: + 4 units  - 251-275: + 5 units - 276-300: + 6 units  Please return in 1.5 months with your sugar log.      Basic Rules for Patients with Type I Diabetes Mellitus  1. The American Diabetes Association (ADA) recommended targets: - fasting sugar <130 - after meal sugar <180 - HbA1C <7%  2. Engage in ?150 min moderate exercise per week  3. Make sure you have ?8h of sleep every night as this helps both blood sugars and your weight.  4. Always keep a sugar log (not only record in your meter) and bring it to all appointments with [place].  5. "15-15 rule" for hypoglycemia: if sugars are low, take 15 g of carbs** ("fast sugar" - e.g. 4 glucose tablets, 4 oz orange juice), wait 15 min, then check sugars again. If still <80, repeat. Continue  until your sugars >80, then eat a normal meal.   6. Teach family members and coworkers to inject glucagon. Have a glucagon set at home and one at work. They should call 911 after using the set.  7. Check sugar before driving. If <100, correct, and only start driving if sugars rise ?100. Check sugar every hour when on a long drive.  8. Check sugar before exercising. If <100, correct, and only start exercising if sugars rise ?100. Check sugar every hour when on a long exercise routine and 1h after you finished exercising.   If >250, check urine for ketones. If you have moderate-large ketones in urine, do not start exercise. Hydrate yourself with clear liquids and correct the high sugar. Recheck sugars and ketones before attempting to exercise.  Be aware that you might need less insulin when exercising.  *intense, short, exercise bursts can increase your sugars, but  *less intense, longer (>1h), exercise  routines can decrease your sugars.   9. Make sure you have a MedAlert bracelet or pendant mentioning "Type I Diabetes Mellitus". If you have a prior episode of severe hypoglycemia or hypoglycemia unawareness, it should also mention this.  10. Please do not walk barefoot. Inspect your feet for sores/cuts and let us know if you have them.  11. Please call [facility] with any questions and concerns [phone]).   **E.g. of "fast carbs":  first choice (15 g):  1 tube glucose gel, GlucoPouch 15, 2 oz glucose liquid  second choice (15-16 g):  3 or 4 glucose tablets (best taken      with water), 15 Dextrose Bits chewable  third choice (15-20 g):   cup fruit juice,  cup regular soda, 1 cup skim milk,  1 cup sports drink  fourth choice (15-20 g):  1 small tube [place] gel (not frosting), 2 tbsp raisins, 1 tbsp table sugar,  candy, jelly beans, gum drops - check package for carb amount   (adapted from: Lenice Pressman. "Insulin therapy and hypoglycemia" Endocrinol Metab Clin N Am 2012, 41: 57-87)

## 2019-02-07 NOTE — Progress Notes (Signed)
Impression and Recommendations:    1. Diabetes mellitus due to underlying condition with diabetic nephropathy, with long-term current use of insulin ([place])   2. Hyperlipidemia associated with type 2 diabetes mellitus ([place])   3. Hypertension associated with diabetes ([place])   4. Diabetic polyneuropathy associated with type 2 diabetes mellitus ([place])   5. Other chronic pancreatitis ([place])   6. Morbid obesity ([place])   7. Diabetic eye exam ([place])   8. Estrogen deficiency   9. Need for Tdap vaccination   10. Chronic back pain, unspecified back location, unspecified back pain laterality-  seeing [place]   11. Acrochordon- over R eye interferring with vision   12. Inflamed acrochordon   13. Vitamin D insufficiency     - Discussed need for patient to continue to obtain management and screenings with all established specialists.  Educated patient at length about the critical importance of keeping health maintenance up to date.  - Participated in lengthy conversation and all questions were answered.  - Per patient, obtained mammogram last week, Wednesday February 13th.  - Need for DEXA.  Order placed today.  - Per patient, blood work done February 11th 2020 through [facility], [facility]ie Poisson.  1. Pain Management - Chronic Back Pain - Follows up with [place]. - Patient has plans to obtain a nerve stimulator in the near future. - Continue to follow up with Pain Management as indicated.  2. Diabetic Polyneuropathy Associated with Type 2 DM - Patient continues on Neurontin daily, 4 capsules by mouth, 3 times daily. - Pain management will continue to monitor and manage this treatment. - Will continue to monitor.  3. Diabetes Mellitus - Patient's A1c 4 months ago was 6.4. - Several months ago, A1c was 6.2, and a year ago, A1c was 5.7.  - Discussed that insulin causes feelings of hunger and can contribute to weight gain. - Educated patient about  alternative courses of diabetes treatment, which are safe for patients with pancreatitis, and can help to aid with weight loss.  - Handout provided today for insulin carb counting. - Educated patient about long-acting insulin vs short-acting insulin. - Advised patient to check her blood sugar two hours after starting to eat, and take Humalog if coverage is needed.  - Adjustments will be made to treatment plan based on lab work PRN.  See med list below. - Patient tolerating meds well without complication.  Denies S-E  - Counseled patient on pathophysiology of disease and discussed various treatment options, which often includes dietary and lifestyle modifications as first line.  Importance of low carb/ketogenic diet discussed with patient in addition to regular exercise.   - Check FBS and 2 hours after the biggest meal of your day.  Keep log and bring in next OV for my review.   Also, if you ever feel poorly, please check your blood pressure and blood sugar, as one or the other could be the cause of your symptoms.  - Being a diabetic, you need yearly eye and foot exams.  - Need for diabetic eye exam in near future.  Referral to ophthalmology provided today.  4. Chronic Pancreatitis - Stable last check. - Lab work drawn today for ongoing assessment, to check levels and make sure they are continuing to come down. - Will continue to monitor   5. Hyperlipidemia Associated with Diabetes Mellitus - Stable last check. - Lab work obtained for reassessment today. - Treatment plan to be adjusted based on lab  results today. - Will continue to monitor.  6. Hypertension Associated with Diabetes Mellitus - Stable at this time. - Continue treatment plan as prescribed.  See med list. - Patient tolerating meds well without complication.  Denies S-E  - Ongoing prudent lifestyle changes such as dash diet and engaging in a regular exercise program discussed with patient.  Educational handouts  provided.  - Ambulatory BP monitoring encouraged. Keep log and bring in next OV.  7. Vitamin D Insufficiency - Continue on Vitamin D supplementation as prescribed. - Will continue to monitor.  8. Inflamed Acrochordon - Patient will return in near future for removal. - Will continue to monitor.  9. BMI Counseling - BMI of 37.59, Morbid Obesity Explained to patient what BMI refers to, and what it means medically.  Told patient to think about it as a "medical risk stratification measurement" and how increasing BMI is associated with increasing risk/ or worsening state of various diseases such as hypertension, hyperlipidemia, diabetes, premature OA, depression etc.  American Heart Association guidelines for healthy diet, basically Mediterranean diet, and exercise guidelines of 30 minutes 5 days per week or more discussed in detail.  Health counseling performed.  All questions answered.  10. Lifestyle & Preventative Health Maintenance - Advised patient to continue working toward exercising to improve overall mental, physical, and emotional health.    - Encouraged patient to engage in daily physical activity as tolerated, especially a formal exercise routine.  Recommended that the patient eventually strive for at least 150 minutes of moderate cardiovascular activity per week according to guidelines established by the [facility].   - Healthy dietary habits encouraged, including low-carb, and high amounts of lean protein in diet.   - Patient should also consume adequate amounts of water.   Education and routine counseling performed. Handouts provided.   Orders Placed This Encounter  Procedures  . DG Bone Density  . Lipid panel  . Hemoglobin A1c  . Comprehensive metabolic panel  . Lipase  . Amylase  . VITAMIN D 25 Hydroxy (Vit-D Deficiency, Fractures)  . Ambulatory referral to Ophthalmology    Medications Discontinued During This Encounter  Medication Reason  . amoxicillin-clavulanate  (AUGMENTIN) 875-125 MG tablet Completed Course  . benzonatate (TESSALON) 200 MG capsule Completed Course  . nortriptyline (PAMELOR) 10 MG capsule Patient Preference      Meds ordered this encounter  Medications  . Tdap (BOOSTRIX) 5-2.5-18.5 LF-MCG/0.5 injection    Sig: Inject 0.5 mLs into the muscle once for 1 dose.    Dispense:  0.5 mL    Refill:  0    The patient was counseled, risk factors were discussed, anticipatory guidance given.  Gross side effects, risk and benefits, and alternatives of medications discussed with patient.  Patient is aware that all medications have potential side effects and we are unable to predict every side effect or drug-drug interaction that may occur.  Expresses verbal understanding and consents to current therapy plan and treatment regimen.   Return for 2) f/up near future for surgical removal several acrochordan.   Please see AVS handed out to patient at the end of our visit for further patient instructions/ counseling done pertaining to today's office visit.    Note:  This document was prepared using Dragon voice recognition software and may include unintentional dictation errors.  This document serves as a record of services personally performed by [name], DO. It was created on her behalf by Toni Amend, a trained medical scribe. The creation of this record  is based on the scribe's personal observations and the provider's statements to them.   I have reviewed the above medical documentation for accuracy and completeness and I concur.  [name], DO 02/07/2019 8:52 PM       Subjective:    Chief Complaint  Patient presents with  . Follow-up    Jasmine Davila is a 68 y.o. female who presents to [place] at [facility] today for Diabetes Management.    Patient knows that she needs to go to an ophthalmologist.  Pain Management Says that her Neurologist "gave up on her" and she was sent to  Pain Management.  Patient was off of Vitamin B6.  Stopped taking another medicine "because it really didn't do any good."  Neurologist referred patient to Pain Management; patient requested to visit [place].  She saw one of Dr. Kirke Corin assistants; had several tests done on her feet.  Was told to try a nerve stimulator.  Had eight needles in her back and is going back on March 10th to get the second treatment.  Notes Medicare has to approve it.  Had this done on a Monday, and had to travel to [place] afterward.  Patient notes she walked through all the airports with no back pain.  Patient was given hydrocodone to take sparingly PRN.  Notes she's only taken about 2-3 max.  She tried tramadol first, and it didn't work.  The pain patch was going to cost the patient over 200 dollars.  She is now waiting to go for her psych evaluation.  Patient has had several recent check-ups where everything was normal. Goes for an EGD next week on Tuesday or Wednesday, with Dr. Achille Rich through [facility].  Vitamin D Supplementation Continues on supplementation as prescribed.  DM HPI: -  She has not been working on diet and exercise for diabetes.  Confirms that when she had the pancreatitis, she was placed immediately on insulin because "the pancreas wasn't working too well" and "may have been half dead."  States she's ben hungry lately "like crazy."  They have the grandkids more now, and there are more snacks in the house.  Pt is currently maintained on the following medications for diabetes:   see med list today Medication compliance - continues on medications as prescribed.  Home glucose readings range - sugars have been running "on the high side."  Notes if she eats bad the night before and takes her sugar, it might be 150, "which I know is not good."   Denies polyuria/polydipsia. Denies hypo/ hyperglycemia symptoms - She denies new onset of: chest pain, exercise intolerance,  shortness of breath, dizziness, visual changes, headache, lower extremity swelling or claudication.   Last diabetic eye exam was No results found for: HMDIABEYEEXA  Foot exam- UTD  Last A1C in the office was:  Lab Results  Component Value Date   HGBA1C 6.4 (A) 09/19/2018   HGBA1C 6.2 (H) 03/09/2018   HGBA1C 5.7 (H) 12/08/2017    Lab Results  Component Value Date   MICROALBUR 10 03/04/2017   LDLCALC 86 04/15/2018   CREATININE 0.74 11/04/2018   1. HTN HPI:  -  Her blood pressure has been controlled at home.  Pt is checking it at home.   - Patient reports good compliance with blood pressure medications  - Denies medication S-E   - Smoking Status noted   - She denies new onset of: chest pain, exercise intolerance, shortness of breath, dizziness, visual  changes, headache, lower extremity swelling or claudication.   Last 3 blood pressure readings in our office are as follows: BP Readings from Last 3 Encounters:  02/07/19 123/77  12/05/18 140/80  11/23/18 125/70    Filed Weights   02/07/19 1058  Weight: 240 lb (108.9 kg)    Last 3 blood pressure readings in our office are as follows: BP Readings from Last 3 Encounters:  02/07/19 123/77  12/05/18 140/80  11/23/18 125/70    BMI Readings from Last 3 Encounters:  02/07/19 37.59 kg/m  12/05/18 36.96 kg/m  11/23/18 36.65 kg/m     Problem  Chronic Back Pain   looking into doing nerve stim   Inflamed Acrochordon  Acrochordon- over R eye interferring with vision   several others       Patient Care Team    Relationship Specialty Notifications Start End  [name], DO PCP - General Family Medicine  11/24/16   [name], MD Consulting Physician Oncology  11/24/16   Clarene Essex, MD Consulting Physician Gastroenterology  04/20/18   Alda Berthold, DO Consulting Physician Neurology  04/20/18   Edrick Kins, DPM Consulting Physician Podiatry  04/20/18      Patient Active Problem List    Diagnosis Date Noted  . Diabetes mellitus due to underlying condition with diabetic nephropathy, with long-term current use of insulin ([place]) 11/25/2016    Priority: High  . Hypertension associated with diabetes ([place]) 11/24/2016    Priority: High  . H/o Cancer:   B- cell lymphoma (spleen and lung) - non-Hodgkin's 11/24/2016    Priority: High  . Diabetic neuropathy - exaccerbated by chemo 11/24/2016    Priority: High  . Hyperlipidemia associated with type 2 diabetes mellitus ([place]) 11/24/2016    Priority: High  . Endometrial disorder- trace endometrial canal fluid seen on CT scan recently and in 2016.  No GYN care many yrs, to GYN for TVUS and further w/up prn 04/20/2018    Priority: Medium  . Morbid obesity ([place]) 11/25/2016    Priority: Medium  . Smoking hx 11/24/2016    Priority: Medium  . Chronic pancreatitis ([place]) 11/24/2016    Priority: Medium  . Vitamin B6 deficiency (non anemic) 11/24/2016    Priority: Medium  . GERD (gastroesophageal reflux disease) 11/24/2016    Priority: Low  . B12 deficiency 11/24/2016    Priority: Low  . Chronic back pain 02/07/2019  . Inflamed acrochordon 02/07/2019  . Acrochordon- over R eye interferring with vision 02/07/2019  . Acute otitis media 11/23/2018  . Acute maxillary sinusitis 11/23/2018  . History of malignant lymphoma 06/03/2018  . Neuropathy 06/03/2018  . Diabetic peripheral neuropathy associated with type 2 diabetes mellitus ([place]) 04/20/2018  . Acute on chronic pancreatitis ([place]) 04/13/2018  . Onychomycosis 03/16/2018  . Neuropathic pain syndrome (non-herpetic) 03/16/2018  . Sleep apnea with CPAP 12/29/2016  . History of decreased platelet count 12/29/2016     Past Medical History:  Diagnosis Date  . Abnormal EKG    hx of right bundle branch block  . Acute pancreatitis    HX OF  . Anemia yrs ago  . Cancer ([place]) dx 2016   B cell lymphoma- Non Hodgkins in remission  . Diabetes mellitus ([place])    TYPE 2  . Diabetic neuropathy  ([place])   . Diabetic neuropathy ([place])   . Endometrial disorder   . GERD (gastroesophageal reflux disease)   . Headache    tension or sinus  . Hypertension   .  Malignant lymphoma ([place])   . Morbid obesity ([place])   . Peripheral neuropathy    BOTH FEET, SLIGHT NEUROPATHY IN HANDS  . Sleep apnea    USES CPAP at times  . Vitamin B 12 deficiency      Past Surgical History:  Procedure Laterality Date  . CHOLECYSTECTOMY  03/06/1996  . colomscopy    . DILATATION & CURETTAGE/HYSTEROSCOPY WITH MYOSURE N/A 07/04/2018   Procedure: DILATATION & CURETTAGE/HYSTEROSCOPY WITH MYOSURE;  Surgeon: Salvadore Dom, MD;  Location: [facility];  Service: Gynecology;  Laterality: N/A;  . MEDIPORT INSERTION, DOUBLE    . MEDIPORT REMOVAL       Family History  Problem Relation Age of Onset  . Cancer Mother 93       colon  . Hypertension Mother   . Osteoporosis Mother   . Heart attack Father   . Heart disease Sister   . Alcohol abuse Maternal Grandfather      Social History   Substance and Sexual Activity  Drug Use No  ,  Social History   Substance and Sexual Activity  Alcohol Use No  ,  Social History   Tobacco Use  Smoking Status Former Smoker  . Packs/day: 0.50  . Years: 4.00  . Pack years: 2.00  . Types: Cigarettes  . Last attempt to quit: 12/21/1968  . Years since quitting: 50.1  Smokeless Tobacco Never Used  Tobacco Comment   as teenager  ,    Current Outpatient Medications on File Prior to Visit  Medication Sig Dispense Refill  . aspirin EC 81 MG tablet Take 81 mg by mouth at bedtime.     . Cholecalciferol (VITAMIN D-3) 5000 units TABS Take 1 tablet by mouth daily.    . cyanocobalamin (,VITAMIN B-12,) 1000 MCG/ML injection Inject 0.5 mLs (500 mcg total) into the muscle every 30 (thirty) days. 10 mL 1  . famotidine (PEPCID) 20 MG tablet TAKE 1 TABLET BY MOUTH 2 TIMES DAILY. 180 tablet 1  . fluticasone (FLONASE) 50 MCG/ACT nasal spray PLACE 1 SPRAY INTO  BOTH NOSTRILS DAILY AS NEEDED FOR ALLERGIES OR RHINITIS. 16 g 5  . folic acid (FOLVITE) 010 MCG tablet Take 400 mcg by mouth daily.    Marland Kitchen gabapentin (NEURONTIN) 300 MG capsule TAKE 4 CAPSULES BY MOUTH 3 TIMES DAILY. (Patient taking differently: Take 1,200 mg by mouth 3 (three) times daily. ) 360 capsule 5  . HUMALOG KWIKPEN 100 UNIT/ML KiwkPen INJECT 20 UNITS INTO THE SKIN DAILY WITH LARGEST MEAL (Patient taking differently: Inject 20 Units into the skin daily. ) 15 mL 1  . LANTUS SOLOSTAR 100 UNIT/ML Solostar Pen INJECT 60 UNITS INTO THE SKIN EVERY MORNING. 30 mL 1  . losartan (COZAAR) 25 MG tablet TAKE 1 TABLET BY MOUTH DAILY. 90 tablet 1  . Multiple Vitamin (MULTIVITAMIN) tablet Take 1 tablet by mouth daily.    . niacin (NIASPAN) 1000 MG CR tablet Take 1 tablet (1,000 mg total) by mouth at bedtime. An hour after 81 mg ASA 90 tablet 3  . NONFORMULARY OR COMPOUNDED ITEM [facility]  Peripheral Neuropathy Cream- Bupivacaine 1%, Doxepin 3%, Gabapentin 6%, Pentoxifylline 3%, Topiramate 1% Apply 1-2 grams to affected area 3-4 times daily Qty. 120 gm 3 refills 1 each 2  . ONE TOUCH ULTRA TEST test strip USE 1 STRIP TO CHECK GLUCOSE THREE TIMES DAILY AS NEEDED  5   No current facility-administered medications on file prior to visit.      Allergies  Allergen Reactions  . Dilaudid [Hydromorphone Hcl] Other (See Comments)    anxious  . Doxycycline Other (See Comments)    Pancreatic issues  . Metronidazole Other (See Comments)    Cream  Burned face  . Pseudoephedrine Hcl Other (See Comments)    Pt unsure     Review of Systems:   General:  Denies fever, chills Optho/Auditory:   Denies visual changes, blurred vision Respiratory:   Denies SOB, cough, wheeze, DIB  Cardiovascular:   Denies chest pain, palpitations, painful respirations Gastrointestinal:   Denies nausea, vomiting, diarrhea.  Endocrine:     Denies new hot or cold intolerance Musculoskeletal:  Denies joint swelling, gait  issues, or new unexplained myalgias/ arthralgias Skin:  Denies rash, suspicious lesions  Neurological:    Denies dizziness, unexplained weakness, numbness  Psychiatric/Behavioral:   Denies mood changes    Objective:     Blood pressure 123/77, pulse (!) 58, temperature 97.8 F (36.6 C), height 5\' 7"  (1.702 m), weight 240 lb (108.9 kg), last menstrual period 12/21/1998, SpO2 100 %.  Body mass index is 37.59 kg/m.  General: Well Developed, well nourished, and in no acute distress.  HEENT: Normocephalic, atraumatic, pupils equal round reactive to light, neck supple, No carotid bruits, no JVD Skin: Warm and dry, cap RF less 2 sec Cardiac: Regular rate and rhythm, S1, S2 WNL's, no murmurs rubs or gallops Respiratory: ECTA B/L, Not using accessory muscles, speaking in full sentences. NeuroM-Sk: Ambulates w/o assistance, moves ext * 4 w/o difficulty, sensation grossly intact.  Ext: scant edema b/l lower ext Psych: No HI/SI, judgement and insight good, Euthymic mood. Full Affect.

## 2019-02-08 LAB — COMPREHENSIVE METABOLIC PANEL
A/G RATIO: 1.5 (ref 1.2–2.2)
ALT: 26 IU/L (ref 0–32)
AST: 27 IU/L (ref 0–40)
Albumin: 4.3 g/dL (ref 3.8–4.8)
Alkaline Phosphatase: 90 IU/L (ref 39–117)
BILIRUBIN TOTAL: 0.4 mg/dL (ref 0.0–1.2)
BUN/Creatinine Ratio: 8 — ABNORMAL LOW (ref 12–28)
BUN: 6 mg/dL — AB (ref 8–27)
CHLORIDE: 98 mmol/L (ref 96–106)
CO2: 27 mmol/L (ref 20–29)
Calcium: 9.7 mg/dL (ref 8.7–10.3)
Creatinine, Ser: 0.74 mg/dL (ref 0.57–1.00)
GFR calc Af Amer: 96 mL/min/{1.73_m2} (ref >59)
GFR calc non Af Amer: 84 mL/min/{1.73_m2} (ref >59)
GLUCOSE: 127 mg/dL — AB (ref 65–99)
Globulin, Total: 2.9 g/dL (ref 1.5–4.5)
POTASSIUM: 4.6 mmol/L (ref 3.5–5.2)
Sodium: 141 mmol/L (ref 134–144)
TOTAL PROTEIN: 7.2 g/dL (ref 6.0–8.5)

## 2019-02-08 LAB — LIPID PANEL
CHOLESTEROL TOTAL: 160 mg/dL (ref 100–199)
Chol/HDL Ratio: 2.2 ratio (ref 0.0–4.4)
HDL: 73 mg/dL (ref >39)
LDL CALC: 67 mg/dL (ref 0–99)
Triglycerides: 101 mg/dL (ref 0–149)
VLDL Cholesterol Cal: 20 mg/dL (ref 5–40)

## 2019-02-08 LAB — LIPASE: LIPASE: 17 U/L (ref 14–72)

## 2019-02-08 LAB — HEMOGLOBIN A1C
ESTIMATED AVERAGE GLUCOSE: 151 mg/dL
HEMOGLOBIN A1C: 6.9 % — AB (ref 4.8–5.6)

## 2019-02-08 LAB — VITAMIN D 25 HYDROXY (VIT D DEFICIENCY, FRACTURES): Vit D, 25-Hydroxy: 48.7 ng/mL (ref 30.0–100.0)

## 2019-02-08 LAB — AMYLASE: AMYLASE: 27 U/L — AB (ref 31–110)

## 2019-02-13 DIAGNOSIS — H52203 Unspecified astigmatism, bilateral: Secondary | ICD-10-CM | POA: Diagnosis not present

## 2019-02-13 DIAGNOSIS — E119 Type 2 diabetes mellitus without complications: Secondary | ICD-10-CM | POA: Diagnosis not present

## 2019-02-13 LAB — HM DIABETES EYE EXAM

## 2019-02-15 DIAGNOSIS — Z9049 Acquired absence of other specified parts of digestive tract: Secondary | ICD-10-CM | POA: Diagnosis not present

## 2019-02-15 DIAGNOSIS — R933 Abnormal findings on diagnostic imaging of other parts of digestive tract: Secondary | ICD-10-CM | POA: Diagnosis not present

## 2019-02-15 DIAGNOSIS — R932 Abnormal findings on diagnostic imaging of liver and biliary tract: Secondary | ICD-10-CM | POA: Diagnosis not present

## 2019-02-15 DIAGNOSIS — K859 Acute pancreatitis without necrosis or infection, unspecified: Secondary | ICD-10-CM | POA: Diagnosis not present

## 2019-02-15 DIAGNOSIS — K869 Disease of pancreas, unspecified: Secondary | ICD-10-CM | POA: Diagnosis not present

## 2019-02-24 ENCOUNTER — Other Ambulatory Visit: Payer: Self-pay | Admitting: Family Medicine

## 2019-02-24 DIAGNOSIS — E785 Hyperlipidemia, unspecified: Secondary | ICD-10-CM

## 2019-02-24 DIAGNOSIS — C801 Malignant (primary) neoplasm, unspecified: Secondary | ICD-10-CM

## 2019-02-24 DIAGNOSIS — E1142 Type 2 diabetes mellitus with diabetic polyneuropathy: Secondary | ICD-10-CM

## 2019-02-24 DIAGNOSIS — E1169 Type 2 diabetes mellitus with other specified complication: Secondary | ICD-10-CM

## 2019-02-24 DIAGNOSIS — I10 Essential (primary) hypertension: Principal | ICD-10-CM

## 2019-02-24 DIAGNOSIS — E0821 Diabetes mellitus due to underlying condition with diabetic nephropathy: Secondary | ICD-10-CM

## 2019-02-24 DIAGNOSIS — Z794 Long term (current) use of insulin: Secondary | ICD-10-CM

## 2019-02-24 DIAGNOSIS — E1159 Type 2 diabetes mellitus with other circulatory complications: Secondary | ICD-10-CM

## 2019-02-24 DIAGNOSIS — I152 Hypertension secondary to endocrine disorders: Secondary | ICD-10-CM

## 2019-02-25 ENCOUNTER — Other Ambulatory Visit: Payer: Self-pay | Admitting: Family Medicine

## 2019-02-25 DIAGNOSIS — Z794 Long term (current) use of insulin: Secondary | ICD-10-CM

## 2019-02-25 DIAGNOSIS — E1159 Type 2 diabetes mellitus with other circulatory complications: Secondary | ICD-10-CM

## 2019-02-25 DIAGNOSIS — E1169 Type 2 diabetes mellitus with other specified complication: Secondary | ICD-10-CM

## 2019-02-25 DIAGNOSIS — I152 Hypertension secondary to endocrine disorders: Secondary | ICD-10-CM

## 2019-02-25 DIAGNOSIS — I10 Essential (primary) hypertension: Principal | ICD-10-CM

## 2019-02-25 DIAGNOSIS — E0821 Diabetes mellitus due to underlying condition with diabetic nephropathy: Secondary | ICD-10-CM

## 2019-02-25 DIAGNOSIS — E1142 Type 2 diabetes mellitus with diabetic polyneuropathy: Secondary | ICD-10-CM

## 2019-02-25 DIAGNOSIS — E785 Hyperlipidemia, unspecified: Secondary | ICD-10-CM

## 2019-02-25 DIAGNOSIS — C801 Malignant (primary) neoplasm, unspecified: Secondary | ICD-10-CM

## 2019-02-27 DIAGNOSIS — M47817 Spondylosis without myelopathy or radiculopathy, lumbosacral region: Secondary | ICD-10-CM | POA: Diagnosis not present

## 2019-02-28 DIAGNOSIS — M47816 Spondylosis without myelopathy or radiculopathy, lumbar region: Secondary | ICD-10-CM | POA: Insufficient documentation

## 2019-03-01 DIAGNOSIS — M5417 Radiculopathy, lumbosacral region: Secondary | ICD-10-CM | POA: Diagnosis not present

## 2019-03-01 DIAGNOSIS — F4542 Pain disorder with related psychological factors: Secondary | ICD-10-CM | POA: Diagnosis not present

## 2019-03-01 DIAGNOSIS — G629 Polyneuropathy, unspecified: Secondary | ICD-10-CM | POA: Diagnosis not present

## 2019-03-06 DIAGNOSIS — K859 Acute pancreatitis without necrosis or infection, unspecified: Secondary | ICD-10-CM | POA: Diagnosis not present

## 2019-03-10 ENCOUNTER — Other Ambulatory Visit: Payer: Self-pay | Admitting: Family Medicine

## 2019-03-22 ENCOUNTER — Encounter: Payer: Self-pay | Admitting: Family Medicine

## 2019-03-24 ENCOUNTER — Other Ambulatory Visit: Payer: Self-pay | Admitting: Family Medicine

## 2019-04-11 ENCOUNTER — Other Ambulatory Visit: Payer: Self-pay

## 2019-04-11 ENCOUNTER — Ambulatory Visit (INDEPENDENT_AMBULATORY_CARE_PROVIDER_SITE_OTHER): Payer: Medicare Other | Admitting: Family Medicine

## 2019-04-11 ENCOUNTER — Encounter: Payer: Self-pay | Admitting: Family Medicine

## 2019-04-11 VITALS — BP 126/62 | HR 75 | Temp 97.5°F | Ht 67.0 in | Wt 234.0 lb

## 2019-04-11 DIAGNOSIS — I10 Essential (primary) hypertension: Secondary | ICD-10-CM | POA: Diagnosis not present

## 2019-04-11 DIAGNOSIS — E1142 Type 2 diabetes mellitus with diabetic polyneuropathy: Secondary | ICD-10-CM | POA: Diagnosis not present

## 2019-04-11 DIAGNOSIS — K861 Other chronic pancreatitis: Secondary | ICD-10-CM

## 2019-04-11 DIAGNOSIS — E559 Vitamin D deficiency, unspecified: Secondary | ICD-10-CM

## 2019-04-11 DIAGNOSIS — I152 Hypertension secondary to endocrine disorders: Secondary | ICD-10-CM

## 2019-04-11 DIAGNOSIS — M549 Dorsalgia, unspecified: Secondary | ICD-10-CM | POA: Diagnosis not present

## 2019-04-11 DIAGNOSIS — E785 Hyperlipidemia, unspecified: Secondary | ICD-10-CM

## 2019-04-11 DIAGNOSIS — M792 Neuralgia and neuritis, unspecified: Secondary | ICD-10-CM

## 2019-04-11 DIAGNOSIS — E1159 Type 2 diabetes mellitus with other circulatory complications: Secondary | ICD-10-CM | POA: Diagnosis not present

## 2019-04-11 DIAGNOSIS — E1169 Type 2 diabetes mellitus with other specified complication: Secondary | ICD-10-CM | POA: Diagnosis not present

## 2019-04-11 DIAGNOSIS — E0821 Diabetes mellitus due to underlying condition with diabetic nephropathy: Secondary | ICD-10-CM

## 2019-04-11 DIAGNOSIS — Z794 Long term (current) use of insulin: Secondary | ICD-10-CM

## 2019-04-11 DIAGNOSIS — G8929 Other chronic pain: Secondary | ICD-10-CM | POA: Diagnosis not present

## 2019-04-11 NOTE — Addendum Note (Signed)
Addended by: Marjory Sneddon on: 04/11/2019 12:31 PM   Modules accepted: Level of Service

## 2019-04-11 NOTE — Progress Notes (Addendum)
Virtual Visit via Video -  Note for [employer], D.O- Primary Care Physician at [facility]   I connected with current patient today by telephone/ video and verified that I am speaking with the correct person using two identifiers.    This visit type was conducted due to national recommendations for restrictions regarding the COVID-19 Pandemic (e.g. social distancing) in an effort to limit this patient's exposure and mitigate transmission in our community.  Due to her co-morbid illnesses, this patient is at least at moderate risk for complications without adequate follow up.  This format is felt to be most appropriate for this patient at this time.  The patient did not have access to video technology/had technical difficulties with video requiring transitioning to audio format only (telephone).  No physical exam could be performed with this format, beyond that communicated to [place] by the patient/ family members as noted.  Additionally my staff members also discussed with the patient that there may be a patient charge related to this service.   The patient expressed understanding, and agreed to proceed.      History of Present Illness:   Last seen 02/07/2019.   Hypertension associated diabetes:  Patient remains on an ARB.  25 mg daily. does not check often but whenever she checks it is under very good control in the 120s to low 130s over 60s-70s.  She is completely asymptomatic.  Current blood pressure is 126/70   Vitamin D deficiency: Well controlled when last checked mid February 2020.  Remains on supplementation.   CHOL HPI:  -  She  is currently managed with:  See med list; LDL at goal when last checked mid February 2020 Txmnt compliance- good Patient reports very little compliance with low chol/ saturated and trans fat diet.  No exercise RUQ pain- no   Muscle aches- no No other s-e  Last lipid panel as follows:  Lab Results  Component Value Date   CHOL 160 02/07/2019   HDL 73 02/07/2019   LDLCALC 67 02/07/2019   TRIG 101 02/07/2019   CHOLHDL 2.2 02/07/2019    Hepatic Function Latest Ref Rng & Units 02/07/2019 11/04/2018 10/22/2018  Total Protein 6.0 - 8.5 g/dL 7.2 7.9 7.8  Albumin 3.8 - 4.8 g/dL 4.3 4.5 4.1  AST 0 - 40 IU/L 27 33 41  ALT 0 - 32 IU/L 26 33(H) 62(H)  Alk Phosphatase 39 - 117 IU/L 90 109 104  Total Bilirubin 0.0 - 1.2 mg/dL 0.4 0.5 1.0  Bilirubin, Direct 0.1 - 0.5 mg/dL - - -     Diabetic polyneuropathy:  Remains on Neurontin daily, tolerating well no complaints..  Please see med list. -Patient is at the max dose I am comfortable giving her.  Patient was seen by Dr. Shelva [facility] and they did not recommend a change in her meds.  Patient has failed Cymbalta, Elavil, Pamelor and other medications in the past.  They did not work for her.  They recommended nerve stimulator for patient at this time however, due to Covid-19 they were not able to move forward with her treatment plan    Chronic pancreatitis;   Patient had some symptoms a couple days ago but she basically went n.p.o. except for clear liquids for a day and her symptoms did improve.    patient was seen at [facility] on 03/06/2023 work-up of her chronic pancreatitis.  I read Dr. Joellyn Haff notes and the physician assistants notes.  They would  like to perform an ERCP in the near future however, this was put off due to Covid-19.     DM HPI: -  She has not been working on diet and exercise for diabetes.  She states with the covid-19 restrictions in all she has been sitting in the house more and "nibbling on snacks/treats more".  Pt is currently maintained on the following medications for diabetes:   Lantus and Humalog.  See med list..  Last A1c was 6.9 mid February 2020.  Prior A1c was 6.4 and 6.2   Medication compliance -is good.  Home glucose readings range:  fbs- 130-160;  2 hr PP- 150-200   Denies polyuria/polydipsia.  Denies hypo/ hyperglycemia symptoms  Last  diabetic eye exam was  Lab Results  Component Value Date   HMDIABEYEEXA No Retinopathy 02/13/2019    Last A1C in the office was:  Lab Results  Component Value Date   HGBA1C 6.9 (H) 02/07/2019   HGBA1C 6.4 (A) 09/19/2018   HGBA1C 6.2 (H) 03/09/2018    Lab Results  Component Value Date   MICROALBUR 10 03/04/2017   LDLCALC 67 02/07/2019   CREATININE 0.74 02/07/2019    Wt Readings from Last 3 Encounters:  04/11/19 234 lb (106.1 kg)  02/07/19 240 lb (108.9 kg)  12/05/18 236 lb (107 kg)    BP Readings from Last 3 Encounters:  04/11/19 126/62  02/07/19 123/77  12/05/18 140/80        Recent Results (from the past 2160 hour(s))  HM MAMMOGRAPHY     Status: None   Collection Time: 02/02/19 12:00 AM  Result Value Ref Range   HM Mammogram 0-4 Bi-Rad 0-4 Bi-Rad, Self Reported Normal  Lipid panel     Status: None   Collection Time: 02/07/19 11:44 AM  Result Value Ref Range   Cholesterol, Total 160 100 - 199 mg/dL   Triglycerides 101 0 - 149 mg/dL   HDL 73 >39 mg/dL   VLDL Cholesterol Cal 20 5 - 40 mg/dL   LDL Calculated 67 0 - 99 mg/dL   Chol/HDL Ratio 2.2 0.0 - 4.4 ratio    Comment:                                   T. Chol/HDL Ratio                                             Men  Women                               1/2 Avg.Risk  3.4    3.3                                   Avg.Risk  5.0    4.4                                2X Avg.Risk  9.6    7.1                                3X  Avg.Risk 23.4   11.0   Hemoglobin A1c     Status: Abnormal   Collection Time: 02/07/19 11:44 AM  Result Value Ref Range   Hgb A1c MFr Bld 6.9 (H) 4.8 - 5.6 %    Comment:          Prediabetes: 5.7 - 6.4          Diabetes: >6.4          Glycemic control for adults with diabetes: <7.0    Est. average glucose Bld gHb Est-mCnc 151 mg/dL  Comprehensive metabolic panel     Status: Abnormal   Collection Time: 02/07/19 11:44 AM  Result Value Ref Range   Glucose 127 (H) 65 - 99 mg/dL   BUN 6  (L) 8 - 27 mg/dL   Creatinine, Ser 0.74 0.57 - 1.00 mg/dL   GFR calc non Af Amer 84 >59 mL/min/1.73   GFR calc Af Amer 96 >59 mL/min/1.73   BUN/Creatinine Ratio 8 (L) 12 - 28   Sodium 141 134 - 144 mmol/L   Potassium 4.6 3.5 - 5.2 mmol/L   Chloride 98 96 - 106 mmol/L   CO2 27 20 - 29 mmol/L   Calcium 9.7 8.7 - 10.3 mg/dL   Total Protein 7.2 6.0 - 8.5 g/dL   Albumin 4.3 3.8 - 4.8 g/dL    Comment:               **Please note reference interval change**   Globulin, Total 2.9 1.5 - 4.5 g/dL   Albumin/Globulin Ratio 1.5 1.2 - 2.2   Bilirubin Total 0.4 0.0 - 1.2 mg/dL   Alkaline Phosphatase 90 39 - 117 IU/L   AST 27 0 - 40 IU/L   ALT 26 0 - 32 IU/L  Lipase     Status: None   Collection Time: 02/07/19 11:44 AM  Result Value Ref Range   Lipase 17 14 - 72 U/L  Amylase     Status: Abnormal   Collection Time: 02/07/19 11:44 AM  Result Value Ref Range   Amylase 27 (L) 31 - 110 U/L    Comment:               **Please note reference interval change**  VITAMIN D 25 Hydroxy (Vit-D Deficiency, Fractures)     Status: None   Collection Time: 02/07/19 11:44 AM  Result Value Ref Range   Vit D, 25-Hydroxy 48.7 30.0 - 100.0 ng/mL    Comment: Vitamin D deficiency has been defined by the Institute of Medicine and an Endocrine Society practice guideline as a level of serum 25-OH vitamin D less than 20 ng/mL (1,2). The Endocrine Society went on to further define vitamin D insufficiency as a level between 21 and 29 ng/mL (2). 1. IOM (Institute of Medicine). 2010. Dietary reference    intakes for calcium and D. [place]: The    [employer]. 2. Holick MF, Binkley [place], Bischoff-Ferrari HA, et al.    Evaluation, treatment, and prevention of vitamin D    deficiency: an Endocrine Society clinical practice    guideline. JCEM. 2011 Jul; 96(7):1911-30.   HM DIABETES EYE EXAM     Status: None   Collection Time: 02/13/19 12:00 AM  Result Value Ref Range   HM Diabetic Eye Exam No Retinopathy  No Retinopathy        Wt Readings from Last 3 Encounters:  04/11/19 234 lb (106.1 kg)  02/07/19 240 lb (108.9 kg)  12/05/18 236 lb (107 kg)  BP Readings from Last 3 Encounters:  04/11/19 126/62  02/07/19 123/77  12/05/18 140/80    Pulse Readings from Last 3 Encounters:  04/11/19 75  02/07/19 (!) 58  12/05/18 80    BMI Readings from Last 3 Encounters:  04/11/19 36.65 kg/m  02/07/19 37.59 kg/m  12/05/18 36.96 kg/m      -Vitals obtained; Medications, allergies reconciled;  personal medical, social, Sx etc. etc. histories were updated by Lanier Prude the medical assistant today and are reflected in below chart   Patient Care Team    Relationship Specialty Notifications Start End  [name], DO PCP - General Family Medicine  11/24/16   [name], MD Consulting Physician Oncology  11/24/16   Clarene Essex, MD Consulting Physician Gastroenterology  04/20/18   Alda Berthold, DO Consulting Physician Neurology  04/20/18   Edrick Kins, DPM Consulting Physician Podiatry  04/20/18   [facility] [place] [facility]  Family Medicine  03/22/19   Arta Silence, MD Consulting Physician Gastroenterology  04/11/19   Holly Bodily, MD Referring Physician Gastroenterology  04/11/19    Comment: Specializing in pancreatitis     Patient Active Problem List   Diagnosis Date Noted  . Diabetes mellitus due to underlying condition with diabetic nephropathy, with long-term current use of insulin ([place]) 11/25/2016    Priority: High  . Hypertension associated with diabetes ([place]) 11/24/2016    Priority: High  . H/o Cancer:   B- cell lymphoma (spleen and lung) - non-Hodgkin's 11/24/2016    Priority: High  . Diabetic neuropathy - exaccerbated by chemo 11/24/2016    Priority: High  . Hyperlipidemia associated with type 2 diabetes mellitus ([place]) 11/24/2016    Priority: High  . Endometrial disorder- trace endometrial canal fluid seen on CT scan recently and in 2016.   No GYN care many yrs, to GYN for TVUS and further w/up prn 04/20/2018    Priority: Medium  . Morbid obesity ([place]) 11/25/2016    Priority: Medium  . Smoking hx 11/24/2016    Priority: Medium  . Chronic pancreatitis ([place]) 11/24/2016    Priority: Medium  . Vitamin B6 deficiency (non anemic) 11/24/2016    Priority: Medium  . GERD (gastroesophageal reflux disease) 11/24/2016    Priority: Low  . B12 deficiency 11/24/2016    Priority: Low  . Vitamin D insufficiency 04/11/2019  . Lumbar spondylosis 02/28/2019  . Chronic back pain 02/07/2019  . Inflamed acrochordon 02/07/2019  . Acrochordon- over R eye interferring with vision 02/07/2019  . Acute otitis media 11/23/2018  . Acute maxillary sinusitis 11/23/2018  . History of malignant lymphoma 06/03/2018  . Neuropathy 06/03/2018  . Diabetic peripheral neuropathy associated with type 2 diabetes mellitus ([place]) 04/20/2018  . Acute on chronic pancreatitis ([place]) 04/13/2018  . Onychomycosis 03/16/2018  . Neuropathic pain syndrome (non-herpetic) 03/16/2018  . Sleep apnea with CPAP 12/29/2016  . History of decreased platelet count 12/29/2016     Current Meds  Medication Sig  . aspirin EC 81 MG tablet Take 81 mg by mouth at bedtime.   . Cholecalciferol (VITAMIN D-3) 5000 units TABS Take 1 tablet by mouth daily.  . cyanocobalamin (,VITAMIN B-12,) 1000 MCG/ML injection Inject 0.5 mLs (500 mcg total) into the muscle every 30 (thirty) days.  . famotidine (PEPCID) 20 MG tablet TAKE 1 TABLET BY MOUTH 2 TIMES DAILY.  . fluticasone (FLONASE) 50 MCG/ACT nasal spray PLACE 1 SPRAY INTO BOTH NOSTRILS DAILY AS NEEDED FOR ALLERGIES OR RHINITIS.  . folic acid (  FOLVITE) 400 MCG tablet Take 400 mcg by mouth daily.  Marland Kitchen gabapentin (NEURONTIN) 300 MG capsule TAKE 4 CAPSULES BY MOUTH 3 TIMES DAILY.  Marland Kitchen HUMALOG KWIKPEN 100 UNIT/ML KwikPen INJECT 20 UNITS INTO THE SKIN DAILY WITH LARGEST MEAL  . HYDROcodone-acetaminophen (NORCO/VICODIN) 5-325 MG tablet Take 1 tablet by  mouth 2 (two) times daily as needed.  Marland Kitchen LANTUS SOLOSTAR 100 UNIT/ML Solostar Pen INJECT 60 UNITS INTO THE SKIN EVERY MORNING.  Marland Kitchen losartan (COZAAR) 25 MG tablet TAKE 1 TABLET BY MOUTH DAILY.  . Multiple Vitamin (MULTIVITAMIN) tablet Take 1 tablet by mouth daily.  . niacin (NIASPAN) 1000 MG CR tablet Take 1 tablet (1,000 mg total) by mouth at bedtime. An hour after 81 mg ASA  . ONE TOUCH ULTRA TEST test strip USE 1 STRIP TO CHECK GLUCOSE THREE TIMES DAILY AS NEEDED     Allergies:  Allergies  Allergen Reactions  . Dilaudid [Hydromorphone Hcl] Other (See Comments)    anxious  . Doxycycline Other (See Comments)    Pancreatic issues  . Metronidazole Other (See Comments)    Cream  Burned face  . Pseudoephedrine Hcl Other (See Comments)    Pt unsure     ROS:  See above HPI for pertinent positives and negatives   Objective:   Blood pressure 126/62, pulse 75, temperature (!) 97.5 F (36.4 C), height 5' 7"  (1.702 m), weight 234 lb (106.1 kg), last menstrual period 12/21/1998. (if some vitals are omitted, this means that patient was UNABLE to obtain them even though asked to get them prior to OV today) General: sounds in no acute distress.  Skin: Pt confirms warm and dry  extremities and pink fingertips Respiratory: speaking in full sentences, no conversational dyspnea Psych: A and O *3, appears insight good, mood- full      Impression and Recommendations:      ICD-10-CM   1. Diabetes mellitus due to underlying condition with diabetic nephropathy, with long-term current use of insulin ([facility]) E08.21    Z79.4   2. Hyperlipidemia associated with type 2 diabetes mellitus ([place]) E11.69    E78.5   3. Hypertension associated with diabetes ([place]) E11.59    I10   4. Diabetic polyneuropathy associated with type 2 diabetes mellitus (H[facility]) E11.42   5. Other chronic pancreatitis ([place]) K86.1   6. Chronic back pain, unspecified back location, unspecified back pain laterality-  [facility] M54.9    G89.29   7. Vitamin D insufficiency E55.9   8. Neuropathic pain syndrome (non-herpetic) M79.2     1: Patient blood work of CMP, A1c etc. up-to-date.  Patient up-to-date with diabetic eye exam.  A1c is at goal of less than 7 albeit, recently she has seen an increase from prior.  Discussed dietary and lifestyle modifications in addition to medications.  Importance of keeping her weight down etc. was discussed.  2: LDL at goal, continue on statin medication.  Adequate hydration encouraged and, walking daily if tolerated was highly encouraged.  3: Blood pressure at goal.  Continue ARB for renal protection.  4: She will continue her Neurontin.  She will follow-up with [facility] in the future when allowed for further treatment options.  Her neuropathy continues to be bothersome and at times wakes her up from sleep.  However, my treatment recommendations of a TCA, Cymbalta etc. are exhausted since none of these had worked for patient in the past.  Further treatment per pain physician  5: Patient was sent to [facility]  chronic pancreatitis specialist and recently seen mid March.  She will follow-up for ERCP when allowed to do so after Covid-19.  Discussed with patient chronic pancreatitis symptoms and to immediately go to n.p.o. except for clear liquids when her stomach starts acting up.  Patient is well-informed and knows what chronic pancreatitis symptoms are since she has had it since 2008.  This is a chronic idiopathic pancreatitis - unknown origin  6: She has chronic back pain in addition to her diabetic polyneuropathy.  She is seeing [facility] Dr. Maryruth Eve for this.  She will follow-up when allowed to.  She does have pain medicines but rarely uses them.  These are prescribed to her by her pain physician.  7: Reviewed recent labs with patient.  Vitamin D in normal range.  She will continue supplementation.  -Encouraged patient in the near future to have  a Medicare wellness office visit since this is something that you do not need a physical exam for.  She states she will do this in the near future and she was told to follow-up 4 months from when last A1c was done in mid February.   As part of my medical decision making, I reviewed the following data within the electronic medical record- utilizing CareEverywhere to see outside specialty notes etc:  History obtained from pt/family, CMA notes reviewed and incorporated, Labs reviewed, Radiograph/ tests reviewed if applicable and OV notes from prior OV's with me, as well as other specialists he has seen since seeing me last, were all reviewed and used in my medical decision making process today. Additionally, discussion had with patient regarding txmnt plan, their biases about that plan etc were used in my medical decision making today.  I discussed the assessment and treatment plan with the patient. The patient was provided an opportunity to ask questions and all were answered.  The patient agreed with the plan and demonstrated an understanding of the instructions.   No barriers to understanding were identified.  Red flag symptoms and signs discussed in detail.  Patient expressed understanding regarding what to do in case of emergency\urgent symptoms   The patient was advised to call back or seek an in-person evaluation if the symptoms worsen or if the condition fails to improve as anticipated.   Return for due for A1c mid May-mid June-Chronic OV then; ALSO recommend Medicare wellness near future.     **Gross side effects, risk and benefits, and alternatives of medications and treatment plan in general discussed with patient.  Patient is aware that all medications have potential side effects and we are unable to predict every side effect or drug-drug interaction that may occur.   Patient was strongly encouraged to call with any questions or concerns they may have concerns.     I provided 25 + minutes  of non-face-to-face time during this encounter (actually was via video OV so, was face-to-face in reality), with over 50% of the time in direct counseling on medical conditions.  Additional time was spent with charting and coordination of care.   [name], DO

## 2019-04-21 ENCOUNTER — Other Ambulatory Visit: Payer: Medicare Other

## 2019-04-25 ENCOUNTER — Other Ambulatory Visit: Payer: Self-pay | Admitting: Family Medicine

## 2019-04-25 ENCOUNTER — Other Ambulatory Visit: Payer: Self-pay

## 2019-04-25 ENCOUNTER — Encounter: Payer: Self-pay | Admitting: Family Medicine

## 2019-04-25 ENCOUNTER — Ambulatory Visit (INDEPENDENT_AMBULATORY_CARE_PROVIDER_SITE_OTHER): Payer: Medicare Other | Admitting: Family Medicine

## 2019-04-25 VITALS — BP 140/58 | HR 58 | Temp 97.1°F | Ht 67.0 in | Wt 275.0 lb

## 2019-04-25 DIAGNOSIS — E538 Deficiency of other specified B group vitamins: Secondary | ICD-10-CM | POA: Diagnosis not present

## 2019-04-25 DIAGNOSIS — E1142 Type 2 diabetes mellitus with diabetic polyneuropathy: Secondary | ICD-10-CM

## 2019-04-25 DIAGNOSIS — E785 Hyperlipidemia, unspecified: Secondary | ICD-10-CM

## 2019-04-25 DIAGNOSIS — Z87891 Personal history of nicotine dependence: Secondary | ICD-10-CM

## 2019-04-25 DIAGNOSIS — E1169 Type 2 diabetes mellitus with other specified complication: Secondary | ICD-10-CM

## 2019-04-25 DIAGNOSIS — E0821 Diabetes mellitus due to underlying condition with diabetic nephropathy: Secondary | ICD-10-CM

## 2019-04-25 DIAGNOSIS — E559 Vitamin D deficiency, unspecified: Secondary | ICD-10-CM

## 2019-04-25 DIAGNOSIS — G629 Polyneuropathy, unspecified: Secondary | ICD-10-CM | POA: Diagnosis not present

## 2019-04-25 DIAGNOSIS — Z23 Encounter for immunization: Secondary | ICD-10-CM | POA: Diagnosis not present

## 2019-04-25 DIAGNOSIS — Z0000 Encounter for general adult medical examination without abnormal findings: Secondary | ICD-10-CM

## 2019-04-25 DIAGNOSIS — E1159 Type 2 diabetes mellitus with other circulatory complications: Secondary | ICD-10-CM | POA: Diagnosis not present

## 2019-04-25 DIAGNOSIS — Z794 Long term (current) use of insulin: Secondary | ICD-10-CM

## 2019-04-25 DIAGNOSIS — I10 Essential (primary) hypertension: Secondary | ICD-10-CM | POA: Diagnosis not present

## 2019-04-25 DIAGNOSIS — I152 Hypertension secondary to endocrine disorders: Secondary | ICD-10-CM

## 2019-04-25 MED ORDER — ZOSTER VAC RECOMB ADJUVANTED 50 MCG/0.5ML IM SUSR: 0.5000 mL | Freq: Once | INTRAMUSCULAR | 0 refills | Status: AC

## 2019-04-25 MED ORDER — PNEUMOCOCCAL 13-VAL CONJ VACC IM SUSP: 0.5000 mL | INTRAMUSCULAR | 0 refills | Status: AC

## 2019-04-25 MED ORDER — TETANUS-DIPHTH-ACELL PERTUSSIS 5-2.5-18.5 LF-MCG/0.5 IM SUSP: 0.5000 mL | Freq: Once | INTRAMUSCULAR | 0 refills | Status: AC

## 2019-04-25 NOTE — Progress Notes (Addendum)
Telehealth office visit note for Jasmine Davila, D.O- at Primary Care at [facility]   I connected with current patient today and verified that I am speaking with the correct person using two identifiers.   . Location of the patient: Home . Location of the provider: Office Only the patient (+/- their family members at pt's discretion) and myself were participating in the encounter    - This visit type was conducted due to national recommendations for restrictions regarding the COVID-19 Pandemic (e.g. social distancing) in an effort to limit this patient's exposure and mitigate transmission in our community.  This format is felt to be most appropriate for this patient at this time.   - The patient did have access to video technology - No physical exam could be performed with this format, beyond that communicated to [place] by the patient/ family members as noted.   - Additionally my office staff/ schedulers discussed with the patient that there may be a monetary charge related to this service, depending on their medical insurance.   The patient expressed understanding, and agreed to proceed.    Subjective:   Jasmine Davila is a 68 y.o. female who presents for Medicare Annual (Subsequent) preventive examination.   Pt goes to [facility]- seen  By Jasmine Davila- done on 09/2013-  Pt will call [facility] near future to schedule.  Needs q 5 yrs due to fam hx. Mom w colon Ca in 50's         Objective:     Vitals: BP (!) 140/58   Pulse (!) 58   Temp (!) 97.1 F (36.2 C)   Ht 5\' 7"  (1.702 m)   Wt 275 lb (124.7 kg)   LMP 12/21/1998 (Approximate)   BMI 43.07 kg/m   Body mass index is 43.07 kg/m.  Advanced Directives 10/19/2018 10/18/2018 06/30/2018 04/13/2018 04/13/2018 11/24/2016  Does Patient Have a Medical Advance Directive? No No Yes No No Yes  Type of Advance Directive - Public librarian;Living will - - [place];Living will  Copy of [place] in Chart? - - No - copy requested - - -  Would patient like information on creating a medical advance directive? No - Patient declined - - No - Patient declined No - Patient declined -    Tobacco Social History   Tobacco Use  Smoking Status Former Smoker  . Packs/day: 0.50  . Years: 4.00  . Pack years: 2.00  . Types: Cigarettes  . Last attempt to quit: 12/21/1968  . Years since quitting: 50.3  Smokeless Tobacco Never Used  Tobacco Comment   as teenager     Counseling given: Not Answered Comment: as teenager     Past Medical History:  Diagnosis Date  . Abnormal EKG    hx of right bundle branch block  . Acute pancreatitis    HX OF  . Anemia yrs ago  . Cancer ([place]) dx 2016   B cell lymphoma- Non Hodgkins in remission  . Diabetes mellitus ([place])    TYPE 2  . Diabetic neuropathy ([place])   . Diabetic neuropathy ([place])   . Endometrial disorder   . GERD (gastroesophageal reflux disease)   . Headache    tension or sinus  . Hypertension   . Malignant lymphoma ([place])   . Morbid obesity ([place])   . Peripheral neuropathy    BOTH FEET, SLIGHT NEUROPATHY IN HANDS  . Sleep apnea    USES CPAP at  times  . Vitamin B 12 deficiency    Past Surgical History:  Procedure Laterality Date  . CHOLECYSTECTOMY  03/06/1996  . colomscopy    . DILATATION & CURETTAGE/HYSTEROSCOPY WITH MYOSURE N/A 07/04/2018   Procedure: DILATATION & CURETTAGE/HYSTEROSCOPY WITH MYOSURE;  Surgeon: Salvadore Dom, MD;  Location: [facility];  Service: Gynecology;  Laterality: N/A;  . MEDIPORT INSERTION, DOUBLE    . MEDIPORT REMOVAL     Family History  Problem Relation Age of Onset  . Cancer Mother 50       colon  . Hypertension Mother   . Osteoporosis Mother   . Heart attack Father   . Heart disease Sister   . Alcohol abuse Maternal Grandfather    Social History   Socioeconomic History  . Marital status: Married    Spouse name: Not on file  . Number of children: 4  . Years  of education: 10  . Highest education level: Not on file  Occupational History  . Occupation: retired from [employer]  Social Needs  . Financial resource strain: Not on file  . Food insecurity:    Worry: Not on file    Inability: Not on file  . Transportation needs:    Medical: Not on file    Non-medical: Not on file  Tobacco Use  . Smoking status: Former Smoker    Packs/day: 0.50    Years: 4.00    Pack years: 2.00    Types: Cigarettes    Last attempt to quit: 12/21/1968    Years since quitting: 50.3  . Smokeless tobacco: Never Used  . Tobacco comment: as teenager  Substance and Sexual Activity  . Alcohol use: No  . Drug use: No  . Sexual activity: Not Currently    Birth control/protection: None  Lifestyle  . Physical activity:    Days per week: Not on file    Minutes per session: Not on file  . Stress: Not on file  Relationships  . Social connections:    Talks on phone: Not on file    Gets together: Not on file    Attends religious service: Not on file    Active member of club or organization: Not on file    Attends meetings of clubs or organizations: Not on file    Relationship status: Not on file  Other Topics Concern  . Not on file  Social History Narrative   Lives with husband in a one story home.  Has 3 living children.  One child died in a MVA.  Retired from [employer].     Education: high school.    Outpatient Encounter Medications as of 04/25/2019  Medication Sig  . aspirin EC 81 MG tablet Take 81 mg by mouth at bedtime.   . Cholecalciferol (VITAMIN D-3) 5000 units TABS Take 1 tablet by mouth daily.  . cyanocobalamin (,VITAMIN B-12,) 1000 MCG/ML injection Inject 0.5 mLs (500 mcg total) into the muscle every 30 (thirty) days.  . famotidine (PEPCID) 20 MG tablet TAKE 1 TABLET BY MOUTH 2 TIMES DAILY.  . fluticasone (FLONASE) 50 MCG/ACT nasal spray PLACE 1 SPRAY INTO BOTH NOSTRILS DAILY AS NEEDED FOR ALLERGIES OR RHINITIS.  . folic acid (FOLVITE) 932  MCG tablet Take 400 mcg by mouth daily.  Marland Kitchen gabapentin (NEURONTIN) 300 MG capsule TAKE 4 CAPSULES BY MOUTH 3 TIMES DAILY.  Marland Kitchen HUMALOG KWIKPEN 100 UNIT/ML KwikPen INJECT 20 UNITS INTO THE SKIN DAILY WITH LARGEST MEAL  . HYDROcodone-acetaminophen (NORCO/VICODIN) 5-325  MG tablet Take 1 tablet by mouth every 6 (six) hours as needed for moderate pain.  Marland Kitchen LANTUS SOLOSTAR 100 UNIT/ML Solostar Pen INJECT 60 UNITS INTO THE SKIN EVERY MORNING.  Marland Kitchen losartan (COZAAR) 25 MG tablet TAKE 1 TABLET BY MOUTH DAILY.  . Multiple Vitamin (MULTIVITAMIN) tablet Take 1 tablet by mouth daily.  . niacin (NIASPAN) 1000 MG CR tablet Take 1 tablet (1,000 mg total) by mouth at bedtime. An hour after 81 mg ASA  . ONE TOUCH ULTRA TEST test strip USE 1 STRIP TO CHECK GLUCOSE THREE TIMES DAILY AS NEEDED  . pneumococcal 13-valent conjugate vaccine (PREVNAR 13) SUSP injection Inject 0.5 mLs into the muscle tomorrow at 10 am for 1 dose.  . Tdap (BOOSTRIX) 5-2.5-18.5 LF-MCG/0.5 injection Inject 0.5 mLs into the muscle once for 1 dose.  Marland Kitchen Zoster Vaccine Adjuvanted [facility]) injection Inject 0.5 mLs into the muscle once for 1 dose.   No facility-administered encounter medications on file as of 04/25/2019.     Activities of Daily Living In your present state of health, do you have any difficulty performing the following activities: 04/25/2019 10/19/2018  Hearing? N N  Vision? N N  Difficulty concentrating or making decisions? N N  Walking or climbing stairs? N N  Dressing or bathing? N N  Doing errands, shopping? N N  Some recent data might be hidden   Activities of Daily Living In your present state of health, do you have difficulty performing the following activities?  1- Driving - no 2- Managing money - no 3- Feeding yourself - no 4- Getting from the bed to the chair - no 5- Climbing a flight of stairs - no 6- Preparing food and eating - no 7- Bathing or showering - no 8- Getting dressed - no 9- Getting to the toilet -  no 10- Using the toilet - no 11- Moving around from place to place - no  Patient states that she feels safe at home.    Patient Care Team: Jasmine Dance, DO as PCP - General (Family Medicine) [name], MD as Consulting Physician (Oncology) Clarene Essex, MD as Consulting Physician (Gastroenterology) Alda Berthold, DO as Consulting Physician (Neurology) Edrick Kins, DPM as Consulting Physician (Podiatry) Care, [place] Better (Family Medicine) Arta Silence, MD as Consulting Physician (Gastroenterology) [place] Murray Hodgkins, MD as Referring Physician (Gastroenterology)    Assessment:   This is a routine wellness examination for [place].  Exercise Activities and Dietary recommendations Current Exercise Habits: The patient does not participate in regular exercise at present  Goals   None     Fall Risk Fall Risk  04/25/2019 12/05/2018 07/27/2018 06/08/2018 12/08/2017  Falls in the past year? 0 0 No No No  Number falls in past yr: - 0 - - -  Injury with Fall? - 0 - - -  Follow up Falls evaluation completed Falls evaluation completed - - -   Is the patient's home free of loose throw rugs in walkways, pet beds, electrical cords, etc?   yes      Grab bars in the bathroom? yes      Handrails on the stairs?   yes      Adequate lighting?   yes  Timed Get Up and Go performed: no prob per pt  Depression Screen PHQ 2/9 Scores 04/25/2019 04/11/2019 02/07/2019 11/23/2018  PHQ - 2 Score 0 0 0 0  PHQ- 9 Score 0 0 0 0    Depression screen [facility] 2/9  04/25/2019 04/11/2019 02/07/2019 11/23/2018 11/04/2018  Decreased Interest 0 0 0 0 0  Down, Depressed, Hopeless 0 0 0 0 0  PHQ - 2 Score 0 0 0 0 0  Altered sleeping 0 0 0 0 0  Tired, decreased energy 0 0 0 0 1  Change in appetite 0 0 0 0 0  Feeling bad or failure about yourself  0 0 0 0 0  Trouble concentrating 0 0 0 0 0  Moving slowly or fidgety/restless 0 0 0 0 0  Suicidal thoughts 0 0 0 0 0  PHQ-9 Score 0 0 0 0 1   Difficult doing work/chores Somewhat difficult Not difficult at all Not difficult at all - -  Some recent data might be hidden    GAD 7 : Generalized Anxiety Score 04/25/2019  Nervous, Anxious, on Edge 0  Control/stop worrying 0  Worry too much - different things 0  Trouble relaxing 0  Restless 0  Easily annoyed or irritable 0  Afraid - awful might happen 0  Total GAD 7 Score 0  Anxiety Difficulty Not difficult at all    Cognitive Function     6CIT Screen 04/25/2019  What Year? 0 points  What month? 0 points  What time? 0 points  Count back from 20 0 points  Months in reverse 0 points  Repeat phrase 0 points  Total Score 0    Immunization History  Administered Date(s) Administered  . Influenza, High Dose Seasonal PF 11/04/2018  . Influenza,inj,Quad PF,6+ Mos 11/24/2016  . Influenza-Unspecified 08/31/2017  . Pneumococcal Conjugate-13 09/21/2015    Qualifies for Shingles Vaccine? Sent in to the pharmacy   Screening Tests Health Maintenance  Topic Date Due  . FOOT EXAM  07/11/2019 (Originally 01/24/2019)  . DEXA SCAN  02/08/2020 (Originally 12/16/2015)  . TETANUS/TDAP  02/08/2020 (Originally 12/15/1969)  . Hepatitis C Screening  04/10/2020 (Originally October 21, 1951)  . PNA vac Low Risk Adult (2 of 2 - PPSV23) 04/10/2020 (Originally 09/20/2016)  . INFLUENZA VACCINE  07/22/2019  . HEMOGLOBIN A1C  08/08/2019  . OPHTHALMOLOGY EXAM  02/14/2020  . MAMMOGRAM  02/02/2021  . COLONOSCOPY  12/22/2023    Cancer Screenings: Lung: Low Dose CT Chest recommended if Age 41-80 years, 30 pack-year currently smoking OR have quit w/in 15years. Patient does not qualify. Breast:  Up to date on Mammogram? Yes   Up to date of Bone Density/Dexa? No Colorectal: 09/29/13   -Entire office visit via live video stream today.  Plan:     -Asked CMA to get records of colonoscopy pathology results.  Also patient knows she is overdue for her repeat colonoscopy.  Told her to put it on her schedule  and she tells me once COVID acute scare is over she will call [facility] to schedule that.   - Also she has had her HIV and hepatitis C screening done in 2018 in [place] which were both negative. -She is supposed to go for bone density but she rescheduled it to a later date due to [place].  She knows to do this.,  Up-to-date on mammogram and no need for GYN Pap smears due to age. -Prescription sent in for tetanus, Shingrix and pneumococcal vaccines.  Importance of these discussed with patient.   Return for mid June- A1c,thyroid labs, B12+ urine mico, foot exam- then f/up OV after. I have personally reviewed and noted the following in the patient's chart:   . Medical and social history . Use of alcohol, tobacco or illicit  drugs  . Current medications and supplements . Functional ability and status . Nutritional status . Physical activity . Advanced directives . List of other physicians . Hospitalizations, surgeries, and ER visits in previous 12 months . Vitals . Screenings to include cognitive, depression, and falls . Referrals and appointments  In addition, I have reviewed and discussed with patient certain preventive protocols, quality metrics, and best practice recommendations. A written personalized care plan for preventive services as well as general preventive health recommendations were provided to patient.     Jasmine Dance, DO  04/25/2019

## 2019-05-03 DIAGNOSIS — K859 Acute pancreatitis without necrosis or infection, unspecified: Secondary | ICD-10-CM | POA: Diagnosis not present

## 2019-05-03 DIAGNOSIS — Z01812 Encounter for preprocedural laboratory examination: Secondary | ICD-10-CM | POA: Diagnosis not present

## 2019-05-09 DIAGNOSIS — Z20828 Contact with and (suspected) exposure to other viral communicable diseases: Secondary | ICD-10-CM | POA: Diagnosis not present

## 2019-06-13 ENCOUNTER — Encounter: Payer: Self-pay | Admitting: Family Medicine

## 2019-06-14 ENCOUNTER — Other Ambulatory Visit: Payer: Self-pay

## 2019-06-14 ENCOUNTER — Ambulatory Visit
Admission: RE | Admit: 2019-06-14 | Discharge: 2019-06-14 | Disposition: A | Discharge status: Home / Self Care | Payer: Medicare Other | Source: Ambulatory Visit | Attending: Family Medicine | Admitting: Family Medicine

## 2019-06-14 DIAGNOSIS — Z13820 Encounter for screening for osteoporosis: Secondary | ICD-10-CM | POA: Diagnosis not present

## 2019-06-14 DIAGNOSIS — E2839 Other primary ovarian failure: Secondary | ICD-10-CM

## 2019-06-20 DIAGNOSIS — K8689 Other specified diseases of pancreas: Secondary | ICD-10-CM | POA: Diagnosis not present

## 2019-06-20 DIAGNOSIS — K838 Other specified diseases of biliary tract: Secondary | ICD-10-CM | POA: Diagnosis not present

## 2019-06-20 DIAGNOSIS — Z7982 Long term (current) use of aspirin: Secondary | ICD-10-CM | POA: Diagnosis not present

## 2019-06-20 DIAGNOSIS — E119 Type 2 diabetes mellitus without complications: Secondary | ICD-10-CM | POA: Diagnosis not present

## 2019-06-20 DIAGNOSIS — K859 Acute pancreatitis without necrosis or infection, unspecified: Secondary | ICD-10-CM | POA: Diagnosis not present

## 2019-06-20 DIAGNOSIS — K219 Gastro-esophageal reflux disease without esophagitis: Secondary | ICD-10-CM | POA: Diagnosis not present

## 2019-06-20 DIAGNOSIS — E1142 Type 2 diabetes mellitus with diabetic polyneuropathy: Secondary | ICD-10-CM | POA: Diagnosis not present

## 2019-06-20 DIAGNOSIS — Z87891 Personal history of nicotine dependence: Secondary | ICD-10-CM | POA: Diagnosis not present

## 2019-06-20 DIAGNOSIS — Z794 Long term (current) use of insulin: Secondary | ICD-10-CM | POA: Diagnosis not present

## 2019-06-20 DIAGNOSIS — G4730 Sleep apnea, unspecified: Secondary | ICD-10-CM | POA: Diagnosis not present

## 2019-06-20 DIAGNOSIS — I10 Essential (primary) hypertension: Secondary | ICD-10-CM | POA: Diagnosis not present

## 2019-06-20 DIAGNOSIS — Z79899 Other long term (current) drug therapy: Secondary | ICD-10-CM | POA: Diagnosis not present

## 2019-06-20 DIAGNOSIS — E669 Obesity, unspecified: Secondary | ICD-10-CM | POA: Diagnosis not present

## 2019-06-20 DIAGNOSIS — Z8572 Personal history of non-Hodgkin lymphomas: Secondary | ICD-10-CM | POA: Diagnosis not present

## 2019-06-20 DIAGNOSIS — Z9989 Dependence on other enabling machines and devices: Secondary | ICD-10-CM | POA: Diagnosis not present

## 2019-06-20 DIAGNOSIS — R935 Abnormal findings on diagnostic imaging of other abdominal regions, including retroperitoneum: Secondary | ICD-10-CM | POA: Diagnosis not present

## 2019-06-20 DIAGNOSIS — Z1159 Encounter for screening for other viral diseases: Secondary | ICD-10-CM | POA: Diagnosis not present

## 2019-06-20 DIAGNOSIS — Z6837 Body mass index (BMI) 37.0-37.9, adult: Secondary | ICD-10-CM | POA: Diagnosis not present

## 2019-06-21 ENCOUNTER — Inpatient Hospital Stay (HOSPITAL_BASED_OUTPATIENT_CLINIC_OR_DEPARTMENT_OTHER)
Admission: EM | Admit: 2019-06-21 | Discharge: 2019-06-23 | DRG: 439 | Disposition: A | Discharge status: Home / Self Care | Payer: Medicare Other | Attending: Internal Medicine | Admitting: Internal Medicine

## 2019-06-21 ENCOUNTER — Inpatient Hospital Stay (HOSPITAL_COMMUNITY): Payer: Medicare Other

## 2019-06-21 ENCOUNTER — Emergency Department (HOSPITAL_BASED_OUTPATIENT_CLINIC_OR_DEPARTMENT_OTHER): Payer: Medicare Other

## 2019-06-21 ENCOUNTER — Encounter (HOSPITAL_BASED_OUTPATIENT_CLINIC_OR_DEPARTMENT_OTHER): Payer: Self-pay | Admitting: Emergency Medicine

## 2019-06-21 ENCOUNTER — Other Ambulatory Visit: Payer: Self-pay

## 2019-06-21 DIAGNOSIS — E669 Obesity, unspecified: Secondary | ICD-10-CM

## 2019-06-21 DIAGNOSIS — E559 Vitamin D deficiency, unspecified: Secondary | ICD-10-CM

## 2019-06-21 DIAGNOSIS — Z87891 Personal history of nicotine dependence: Secondary | ICD-10-CM

## 2019-06-21 DIAGNOSIS — I10 Essential (primary) hypertension: Secondary | ICD-10-CM

## 2019-06-21 DIAGNOSIS — Z881 Allergy status to other antibiotic agents status: Secondary | ICD-10-CM | POA: Diagnosis not present

## 2019-06-21 DIAGNOSIS — K5792 Diverticulitis of intestine, part unspecified, without perforation or abscess without bleeding: Secondary | ICD-10-CM

## 2019-06-21 DIAGNOSIS — C801 Malignant (primary) neoplasm, unspecified: Secondary | ICD-10-CM | POA: Diagnosis present

## 2019-06-21 DIAGNOSIS — E1169 Type 2 diabetes mellitus with other specified complication: Secondary | ICD-10-CM | POA: Diagnosis present

## 2019-06-21 DIAGNOSIS — Z6836 Body mass index (BMI) 36.0-36.9, adult: Secondary | ICD-10-CM

## 2019-06-21 DIAGNOSIS — E1159 Type 2 diabetes mellitus with other circulatory complications: Secondary | ICD-10-CM | POA: Diagnosis present

## 2019-06-21 DIAGNOSIS — E1142 Type 2 diabetes mellitus with diabetic polyneuropathy: Secondary | ICD-10-CM | POA: Diagnosis present

## 2019-06-21 DIAGNOSIS — C8510 Unspecified B-cell lymphoma, unspecified site: Secondary | ICD-10-CM | POA: Diagnosis present

## 2019-06-21 DIAGNOSIS — Y848 Other medical procedures as the cause of abnormal reaction of the patient, or of later complication, without mention of misadventure at the time of the procedure: Secondary | ICD-10-CM | POA: Diagnosis present

## 2019-06-21 DIAGNOSIS — E878 Other disorders of electrolyte and fluid balance, not elsewhere classified: Secondary | ICD-10-CM | POA: Insufficient documentation

## 2019-06-21 DIAGNOSIS — Z8249 Family history of ischemic heart disease and other diseases of the circulatory system: Secondary | ICD-10-CM

## 2019-06-21 DIAGNOSIS — E871 Hypo-osmolality and hyponatremia: Secondary | ICD-10-CM

## 2019-06-21 DIAGNOSIS — E538 Deficiency of other specified B group vitamins: Secondary | ICD-10-CM | POA: Diagnosis present

## 2019-06-21 DIAGNOSIS — Z800 Family history of malignant neoplasm of digestive organs: Secondary | ICD-10-CM

## 2019-06-21 DIAGNOSIS — G4733 Obstructive sleep apnea (adult) (pediatric): Secondary | ICD-10-CM | POA: Diagnosis present

## 2019-06-21 DIAGNOSIS — Z7982 Long term (current) use of aspirin: Secondary | ICD-10-CM

## 2019-06-21 DIAGNOSIS — R7989 Other specified abnormal findings of blood chemistry: Secondary | ICD-10-CM

## 2019-06-21 DIAGNOSIS — K858 Other acute pancreatitis without necrosis or infection: Secondary | ICD-10-CM

## 2019-06-21 DIAGNOSIS — R17 Unspecified jaundice: Secondary | ICD-10-CM | POA: Diagnosis present

## 2019-06-21 DIAGNOSIS — K5732 Diverticulitis of large intestine without perforation or abscess without bleeding: Secondary | ICD-10-CM | POA: Diagnosis present

## 2019-06-21 DIAGNOSIS — R1013 Epigastric pain: Secondary | ICD-10-CM | POA: Diagnosis not present

## 2019-06-21 DIAGNOSIS — Z7951 Long term (current) use of inhaled steroids: Secondary | ICD-10-CM

## 2019-06-21 DIAGNOSIS — Z8572 Personal history of non-Hodgkin lymphomas: Secondary | ICD-10-CM | POA: Diagnosis not present

## 2019-06-21 DIAGNOSIS — I152 Hypertension secondary to endocrine disorders: Secondary | ICD-10-CM | POA: Diagnosis present

## 2019-06-21 DIAGNOSIS — Z885 Allergy status to narcotic agent status: Secondary | ICD-10-CM | POA: Diagnosis not present

## 2019-06-21 DIAGNOSIS — K8500 Idiopathic acute pancreatitis without necrosis or infection: Secondary | ICD-10-CM | POA: Diagnosis not present

## 2019-06-21 DIAGNOSIS — Z888 Allergy status to other drugs, medicaments and biological substances status: Secondary | ICD-10-CM

## 2019-06-21 DIAGNOSIS — Z8262 Family history of osteoporosis: Secondary | ICD-10-CM

## 2019-06-21 DIAGNOSIS — Z03818 Encounter for observation for suspected exposure to other biological agents ruled out: Secondary | ICD-10-CM | POA: Diagnosis not present

## 2019-06-21 DIAGNOSIS — K859 Acute pancreatitis without necrosis or infection, unspecified: Secondary | ICD-10-CM | POA: Diagnosis not present

## 2019-06-21 DIAGNOSIS — K219 Gastro-esophageal reflux disease without esophagitis: Secondary | ICD-10-CM | POA: Diagnosis present

## 2019-06-21 DIAGNOSIS — Z1159 Encounter for screening for other viral diseases: Secondary | ICD-10-CM

## 2019-06-21 DIAGNOSIS — R609 Edema, unspecified: Secondary | ICD-10-CM

## 2019-06-21 DIAGNOSIS — Z862 Personal history of diseases of the blood and blood-forming organs and certain disorders involving the immune mechanism: Secondary | ICD-10-CM

## 2019-06-21 DIAGNOSIS — K851 Biliary acute pancreatitis without necrosis or infection: Principal | ICD-10-CM | POA: Diagnosis present

## 2019-06-21 DIAGNOSIS — K861 Other chronic pancreatitis: Secondary | ICD-10-CM | POA: Diagnosis not present

## 2019-06-21 DIAGNOSIS — R945 Abnormal results of liver function studies: Secondary | ICD-10-CM | POA: Diagnosis not present

## 2019-06-21 DIAGNOSIS — E785 Hyperlipidemia, unspecified: Secondary | ICD-10-CM | POA: Diagnosis present

## 2019-06-21 DIAGNOSIS — I4510 Unspecified right bundle-branch block: Secondary | ICD-10-CM | POA: Diagnosis present

## 2019-06-21 DIAGNOSIS — Z794 Long term (current) use of insulin: Secondary | ICD-10-CM | POA: Diagnosis not present

## 2019-06-21 DIAGNOSIS — M7989 Other specified soft tissue disorders: Secondary | ICD-10-CM

## 2019-06-21 DIAGNOSIS — E0821 Diabetes mellitus due to underlying condition with diabetic nephropathy: Secondary | ICD-10-CM

## 2019-06-21 LAB — GLUCOSE, CAPILLARY
Glucose-Capillary: 139 mg/dL — ABNORMAL HIGH (ref 70–99)
Glucose-Capillary: 114 mg/dL — ABNORMAL HIGH (ref 70–99)
Glucose-Capillary: 115 mg/dL — ABNORMAL HIGH (ref 70–99)

## 2019-06-21 LAB — CBC WITH DIFFERENTIAL/PLATELET
Abs Immature Granulocytes: 0.04 10*3/uL (ref 0.00–0.07)
Basophils Absolute: 0 10*3/uL (ref 0.0–0.1)
Basophils Relative: 0 %
Eosinophils Absolute: 0.1 10*3/uL (ref 0.0–0.5)
Eosinophils Relative: 1 %
HCT: 38 % (ref 36.0–46.0)
Hemoglobin: 12.7 g/dL (ref 12.0–15.0)
Immature Granulocytes: 0 %
Lymphocytes Relative: 9 %
Lymphs Abs: 0.9 10*3/uL (ref 0.7–4.0)
MCH: 29.2 pg (ref 26.0–34.0)
MCHC: 33.4 g/dL (ref 30.0–36.0)
MCV: 87.4 fL (ref 80.0–100.0)
Monocytes Absolute: 0.4 10*3/uL (ref 0.1–1.0)
Monocytes Relative: 4 %
Neutro Abs: 8.2 10*3/uL — ABNORMAL HIGH (ref 1.7–7.7)
Neutrophils Relative %: 86 %
Platelets: 136 10*3/uL — ABNORMAL LOW (ref 150–400)
RBC: 4.35 MIL/uL (ref 3.87–5.11)
RDW: 12.6 % (ref 11.5–15.5)
WBC: 9.6 10*3/uL (ref 4.0–10.5)
nRBC: 0 % (ref 0.0–0.2)

## 2019-06-21 LAB — LIPASE, BLOOD: Lipase: 1498 U/L — ABNORMAL HIGH (ref 11–51)

## 2019-06-21 LAB — COMPREHENSIVE METABOLIC PANEL
ALT: 104 U/L — ABNORMAL HIGH (ref 0–44)
AST: 210 U/L — ABNORMAL HIGH (ref 15–41)
Albumin: 3.8 g/dL (ref 3.5–5.0)
Alkaline Phosphatase: 154 U/L — ABNORMAL HIGH (ref 38–126)
Anion gap: 10 (ref 5–15)
BUN: 12 mg/dL (ref 8–23)
CO2: 26 mmol/L (ref 22–32)
Calcium: 9 mg/dL (ref 8.9–10.3)
Chloride: 96 mmol/L — ABNORMAL LOW (ref 98–111)
Creatinine, Ser: 0.71 mg/dL (ref 0.44–1.00)
GFR calc Af Amer: >60 mL/min (ref >60)
GFR calc non Af Amer: >60 mL/min (ref >60)
Glucose, Bld: 182 mg/dL — ABNORMAL HIGH (ref 70–99)
Potassium: 3.9 mmol/L (ref 3.5–5.1)
Sodium: 132 mmol/L — ABNORMAL LOW (ref 135–145)
Total Bilirubin: 2.3 mg/dL — ABNORMAL HIGH (ref 0.3–1.2)
Total Protein: 7.1 g/dL (ref 6.5–8.1)

## 2019-06-21 LAB — CBC
HCT: 33.3 % — ABNORMAL LOW (ref 36.0–46.0)
Hemoglobin: 11.1 g/dL — ABNORMAL LOW (ref 12.0–15.0)
MCH: 29.9 pg (ref 26.0–34.0)
MCHC: 33.3 g/dL (ref 30.0–36.0)
MCV: 89.8 fL (ref 80.0–100.0)
Platelets: 117 10*3/uL — ABNORMAL LOW (ref 150–400)
RBC: 3.71 MIL/uL — ABNORMAL LOW (ref 3.87–5.11)
RDW: 12.9 % (ref 11.5–15.5)
WBC: 4.8 10*3/uL (ref 4.0–10.5)
nRBC: 0 % (ref 0.0–0.2)

## 2019-06-21 LAB — CREATININE, SERUM
Creatinine, Ser: 0.57 mg/dL (ref 0.44–1.00)
GFR calc Af Amer: >60 mL/min (ref >60)
GFR calc non Af Amer: >60 mL/min (ref >60)

## 2019-06-21 LAB — SARS CORONAVIRUS 2 AG (30 MIN TAT): SARS Coronavirus 2 Ag: NEGATIVE

## 2019-06-21 LAB — TROPONIN I (HIGH SENSITIVITY)
Troponin I (High Sensitivity): 12 ng/L (ref <18)
Troponin I (High Sensitivity): 11 ng/L (ref <18)

## 2019-06-21 MED ORDER — INSULIN GLARGINE 100 UNIT/ML ~~LOC~~ SOLN
45.0000 [IU] | Freq: Every day | SUBCUTANEOUS | Status: DC
  Administered 2019-06-21: 45 [IU] via SUBCUTANEOUS
  Filled 2019-06-21 (×2): qty 0.45

## 2019-06-21 MED ORDER — ONDANSETRON HCL 4 MG/2ML IJ SOLN
4.0000 mg | Freq: Four times a day (QID) | INTRAMUSCULAR | Status: DC | PRN
  Administered 2019-06-21: 4 mg via INTRAVENOUS
  Filled 2019-06-21: qty 2

## 2019-06-21 MED ORDER — ASPIRIN EC 81 MG PO TBEC
81.0000 mg | DELAYED_RELEASE_TABLET | Freq: Every day | ORAL | Status: DC
  Administered 2019-06-21 – 2019-06-22 (×2): 81 mg via ORAL
  Filled 2019-06-21 (×2): qty 1

## 2019-06-21 MED ORDER — MORPHINE SULFATE (PF) 4 MG/ML IV SOLN
4.0000 mg | INTRAVENOUS | Status: DC | PRN
  Administered 2019-06-21 (×3): 4 mg via INTRAVENOUS
  Filled 2019-06-21 (×3): qty 1

## 2019-06-21 MED ORDER — LIP MEDEX EX OINT
TOPICAL_OINTMENT | CUTANEOUS | Status: AC
  Administered 2019-06-21: 15:00:00
  Filled 2019-06-21: qty 7

## 2019-06-21 MED ORDER — ONDANSETRON HCL 4 MG/2ML IJ SOLN
INTRAMUSCULAR | Status: AC
  Filled 2019-06-21: qty 2

## 2019-06-21 MED ORDER — MORPHINE SULFATE (PF) 4 MG/ML IV SOLN
4.0000 mg | Freq: Once | INTRAVENOUS | Status: AC
  Administered 2019-06-21: 4 mg via INTRAVENOUS

## 2019-06-21 MED ORDER — ENOXAPARIN SODIUM 60 MG/0.6ML ~~LOC~~ SOLN
50.0000 mg | SUBCUTANEOUS | Status: DC
  Administered 2019-06-21 – 2019-06-22 (×2): 50 mg via SUBCUTANEOUS
  Filled 2019-06-21 (×2): qty 0.6

## 2019-06-21 MED ORDER — PIPERACILLIN-TAZOBACTAM 3.375 G IVPB
3.3750 g | Freq: Three times a day (TID) | INTRAVENOUS | Status: DC
  Administered 2019-06-21 – 2019-06-23 (×6): 3.375 g via INTRAVENOUS
  Filled 2019-06-21 (×6): qty 50

## 2019-06-21 MED ORDER — FLUTICASONE PROPIONATE 50 MCG/ACT NA SUSP
1.0000 | Freq: Every day | NASAL | Status: DC | PRN
  Filled 2019-06-21 (×2): qty 16

## 2019-06-21 MED ORDER — GABAPENTIN 400 MG PO CAPS
1200.0000 mg | ORAL_CAPSULE | Freq: Three times a day (TID) | ORAL | Status: DC
  Administered 2019-06-21 – 2019-06-23 (×7): 1200 mg via ORAL
  Filled 2019-06-21 (×2): qty 3
  Filled 2019-06-21: qty 12
  Filled 2019-06-21 (×4): qty 3

## 2019-06-21 MED ORDER — ACETAMINOPHEN 325 MG PO TABS
650.0000 mg | ORAL_TABLET | Freq: Four times a day (QID) | ORAL | Status: DC | PRN
  Administered 2019-06-22: 650 mg via ORAL
  Filled 2019-06-21: qty 2

## 2019-06-21 MED ORDER — SODIUM CHLORIDE 0.9 % IV BOLUS
1000.0000 mL | Freq: Once | INTRAVENOUS | Status: AC
  Administered 2019-06-21: 1000 mL via INTRAVENOUS

## 2019-06-21 MED ORDER — PIPERACILLIN-TAZOBACTAM 3.375 G IVPB 30 MIN
3.3750 g | Freq: Once | INTRAVENOUS | Status: AC
  Administered 2019-06-21: 06:00:00 3.375 g via INTRAVENOUS
  Filled 2019-06-21 (×2): qty 50

## 2019-06-21 MED ORDER — MORPHINE SULFATE (PF) 4 MG/ML IV SOLN
4.0000 mg | Freq: Once | INTRAVENOUS | Status: AC
  Administered 2019-06-21: 4 mg via INTRAVENOUS
  Filled 2019-06-21: qty 1

## 2019-06-21 MED ORDER — ONDANSETRON HCL 4 MG PO TABS: 4.0000 mg | ORAL_TABLET | Freq: Four times a day (QID) | ORAL | Status: DC | PRN

## 2019-06-21 MED ORDER — IOHEXOL 300 MG/ML  SOLN
100.0000 mL | Freq: Once | INTRAMUSCULAR | Status: AC | PRN
  Administered 2019-06-21: 100 mL via INTRAVENOUS

## 2019-06-21 MED ORDER — INSULIN ASPART 100 UNIT/ML ~~LOC~~ SOLN
0.0000 [IU] | SUBCUTANEOUS | Status: DC
  Administered 2019-06-22: 1 [IU] via SUBCUTANEOUS

## 2019-06-21 MED ORDER — VITAMIN D 25 MCG (1000 UNIT) PO TABS
5000.0000 [IU] | ORAL_TABLET | Freq: Every day | ORAL | Status: DC
  Administered 2019-06-21 – 2019-06-23 (×3): 5000 [IU] via ORAL
  Filled 2019-06-21 (×3): qty 5

## 2019-06-21 MED ORDER — ACETAMINOPHEN 650 MG RE SUPP: 650.0000 mg | Freq: Four times a day (QID) | RECTAL | Status: DC | PRN

## 2019-06-21 MED ORDER — SODIUM CHLORIDE 0.9 % IV BOLUS (SEPSIS)
1000.0000 mL | Freq: Once | INTRAVENOUS | Status: AC
  Administered 2019-06-21: 1000 mL via INTRAVENOUS

## 2019-06-21 MED ORDER — ONDANSETRON HCL 4 MG/2ML IJ SOLN
4.0000 mg | Freq: Once | INTRAMUSCULAR | Status: AC
  Administered 2019-06-21: 4 mg via INTRAVENOUS

## 2019-06-21 MED ORDER — SODIUM CHLORIDE 0.9 % IV SOLN
1000.0000 mL | INTRAVENOUS | Status: DC
  Administered 2019-06-21: 1000 mL via INTRAVENOUS

## 2019-06-21 MED ORDER — ALBUTEROL SULFATE (2.5 MG/3ML) 0.083% IN NEBU: 2.5000 mg | INHALATION_SOLUTION | RESPIRATORY_TRACT | Status: DC | PRN

## 2019-06-21 MED ORDER — MORPHINE SULFATE (PF) 4 MG/ML IV SOLN
INTRAVENOUS | Status: AC
  Administered 2019-06-21: 4 mg via INTRAVENOUS
  Filled 2019-06-21: qty 1

## 2019-06-21 MED ORDER — ADULT MULTIVITAMIN W/MINERALS CH
1.0000 | ORAL_TABLET | Freq: Every day | ORAL | Status: DC
  Administered 2019-06-21 – 2019-06-23 (×3): 1 via ORAL
  Filled 2019-06-21 (×3): qty 1

## 2019-06-21 MED ORDER — FOLIC ACID 1 MG PO TABS
0.5000 mg | ORAL_TABLET | Freq: Every day | ORAL | Status: DC
  Administered 2019-06-21 – 2019-06-23 (×3): 0.5 mg via ORAL
  Filled 2019-06-21 (×3): qty 1

## 2019-06-21 MED ORDER — SODIUM CHLORIDE 0.9 % IV SOLN: 1000.0000 mL | INTRAVENOUS | Status: DC

## 2019-06-21 MED ORDER — FAMOTIDINE 20 MG PO TABS
20.0000 mg | ORAL_TABLET | Freq: Two times a day (BID) | ORAL | Status: DC
  Administered 2019-06-21 – 2019-06-23 (×5): 20 mg via ORAL
  Filled 2019-06-21 (×5): qty 1

## 2019-06-21 MED ORDER — HYDROCODONE-ACETAMINOPHEN 5-325 MG PO TABS
1.0000 | ORAL_TABLET | Freq: Four times a day (QID) | ORAL | Status: DC | PRN
  Administered 2019-06-23: 1 via ORAL
  Filled 2019-06-21: qty 1

## 2019-06-21 MED ORDER — NIACIN ER (ANTIHYPERLIPIDEMIC) 500 MG PO TBCR
1000.0000 mg | EXTENDED_RELEASE_TABLET | Freq: Every day | ORAL | Status: DC
  Administered 2019-06-21 – 2019-06-22 (×2): 1000 mg via ORAL
  Filled 2019-06-21 (×2): qty 2

## 2019-06-21 MED ORDER — SODIUM CHLORIDE 0.9 % IV SOLN
1000.0000 mL | INTRAVENOUS | Status: DC
  Administered 2019-06-21 – 2019-06-22 (×3): 1000 mL via INTRAVENOUS

## 2019-06-21 NOTE — Progress Notes (Signed)
Left lower extremity venous duplex completed. Preliminary results in Chart review CV proc. [facility], [place]  06/21/2019,3:40PM

## 2019-06-21 NOTE — Progress Notes (Signed)
Pharmacy Antibiotic Note  Jasmine Davila is a 68 y.o. female who presented to ED on  06/21/2019 with  abdominal pain following ERCP and stenting performed at [facility] for recurrent pancreatitis.  CT abdomen showed acute edematous pancreatitis with incidental finding of mild uncomplicated sigmoid diverticulitis. Pharmacy has been consulted for Zosyn dosing.  Patient received Zosyn 3.375 g IV x 1 today at 0630.  Plan: Zosyn 3.375 grams IV q8h (4-hour infusion) Follow clinical course  Height: 5\' 7"  (170.2 cm) Weight: 232 lb (105.2 kg) IBW/kg (Calculated) : 61.6  Temp (24hrs), Avg:97.9 F (36.6 C), Min:97.8 F (36.6 C), Max:98.2 F (36.8 C)  Recent Labs  Lab 06/21/19 0421  WBC 9.6  CREATININE 0.71    Estimated Creatinine Clearance: 83.9 mL/min (by C-G formula based on SCr of 0.71 mg/dL).    Allergies  Allergen Reactions  . Dilaudid [Hydromorphone Hcl] Other (See Comments)    anxious  . Doxycycline Other (See Comments)    Pancreatic issues  . Metronidazole Other (See Comments)    Cream  Burned face  . Pseudoephedrine Hcl Other (See Comments)    Pt unsure    Antimicrobials this admission: 7/1 Zosyn >>    Dose adjustments this admission:   Microbiology results: 7/1 [place]: neg  Thank you for allowing pharmacy to be a part of this patient's care.  Clayburn Pert, PharmD, BCPS [phone] 06/21/2019  11:07 AM

## 2019-06-21 NOTE — ED Triage Notes (Signed)
Pt reports onset of abd pain at 2100 with nausea. Denies any vomiting or diarrhea. Pt had ERCP yesterday at [facility].

## 2019-06-21 NOTE — ED Provider Notes (Addendum)
[facility] Provider Note  CSN: [id number] Arrival date & time: 06/21/19 0355  Chief Complaint(s) Abdominal Pain  HPI Jasmine Davila is a 68 y.o. female with extensive past medical history listed below including hypertension, diabetes, recurrent pancreatitis status post ERCP and stenting that was performed yesterday afternoon by [facility] gastroenterology where she had a pancreatic stent placement.  She presents to the emergency department tonight for 7 hours of gradually worsening epigastric abdominal pain similar to her prior pancreatitis flares.  Pain began around 9 PM last night.  She believed that it was due to the procedure.  However at 2 AM the pain was excruciating.  Described as deep ache to the epigastrium and right upper quadrant.  Exacerbated with movement and palpation.  No alleviating factors.  She is endorsing nausea without emesis.  No fevers or chills.  No cough or congestion.  No chest pain or shortness of breath.  No diarrhea.  Denies any other physical complaints.  HPI  Past Medical History Past Medical History:  Diagnosis Date   Abnormal EKG    hx of right bundle branch block   Acute pancreatitis    HX OF   Anemia yrs ago   Cancer [facility]) dx 2016   B cell lymphoma- Non Hodgkins in remission   Diabetes mellitus ([place])    TYPE 2   Diabetic neuropathy (HCC)    Diabetic neuropathy (HCC)    Endometrial disorder    GERD (gastroesophageal reflux disease)    Headache    tension or sinus   Hypertension    Malignant lymphoma (HCC)    Morbid obesity (HCC)    Peripheral neuropathy    BOTH FEET, SLIGHT NEUROPATHY IN HANDS   Sleep apnea    USES CPAP at times   Vitamin B 12 deficiency    Patient Active Problem List   Diagnosis Date Noted   Acute pancreatitis 06/21/2019   Vitamin D insufficiency 04/11/2019   Lumbar spondylosis 02/28/2019   Chronic back pain 02/07/2019   Inflamed acrochordon 02/07/2019    Acrochordon- over R eye interferring with vision 02/07/2019   Acute otitis media 11/23/2018   Acute maxillary sinusitis 11/23/2018   History of malignant lymphoma 06/03/2018   Neuropathy 06/03/2018   Endometrial disorder- trace endometrial canal fluid seen on CT scan recently and in 2016.  No GYN care many yrs, to GYN for TVUS and further w/up prn 04/20/2018   Diabetic peripheral neuropathy associated with type 2 diabetes mellitus ([place]) 04/20/2018   Acute on chronic pancreatitis ([place]) 04/13/2018   Onychomycosis 03/16/2018   Neuropathic pain syndrome (non-herpetic) 03/16/2018   Sleep apnea with CPAP 12/29/2016   History of decreased platelet count 12/29/2016   Morbid obesity ([place]) 11/25/2016   Diabetes mellitus due to underlying condition with diabetic nephropathy, with long-term current use of insulin ([place]) 11/25/2016   Smoking hx 11/24/2016   Hypertension associated with diabetes ([place]) 11/24/2016   Chronic pancreatitis ([place]) 11/24/2016   H/o Cancer:   B- cell lymphoma (spleen and lung) - non-Hodgkin's 11/24/2016   Diabetic neuropathy - exaccerbated by chemo 11/24/2016   GERD (gastroesophageal reflux disease) 11/24/2016   Hyperlipidemia associated with type 2 diabetes mellitus ([place]) 11/24/2016   B12 deficiency 11/24/2016   Vitamin B6 deficiency (non anemic) 11/24/2016   Home Medication(s) Prior to Admission medications   Medication Sig Start Date End Date Taking? Authorizing Provider  aspirin EC 81 MG tablet Take 81 mg by mouth at bedtime.     [provider]  Cholecalciferol (VITAMIN D-3) 5000 units TABS Take 1 tablet by mouth daily.    [provider]  cyanocobalamin (,VITAMIN B-12,) 1000 MCG/ML injection Inject 0.5 mLs (500 mcg total) into the muscle every 30 (thirty) days. 04/05/18   Opalski, Deborah, DO  famotidine (PEPCID) 20 MG tablet TAKE 1 TABLET BY MOUTH 2 TIMES DAILY. 03/27/19   Opalski, Deborah, DO  fluticasone (FLONASE) 50 MCG/ACT nasal  spray PLACE 1 SPRAY INTO BOTH NOSTRILS DAILY AS NEEDED FOR ALLERGIES OR RHINITIS. 01/30/19   Danford, Valetta Fuller D, NP  folic acid (FOLVITE) 825 MCG tablet Take 400 mcg by mouth daily.    [provider]  gabapentin (NEURONTIN) 300 MG capsule TAKE 4 CAPSULES BY MOUTH 3 TIMES DAILY. 02/27/19   Opalski, Neoma Laming, DO  HUMALOG KWIKPEN 100 UNIT/ML KwikPen INJECT 20 UNITS INTO THE SKIN DAILY WITH LARGEST MEAL 03/13/19   Opalski, Neoma Laming, DO  HYDROcodone-acetaminophen (NORCO/VICODIN) 5-325 MG tablet Take 1 tablet by mouth every 6 (six) hours as needed for moderate pain.    [provider]  LANTUS SOLOSTAR 100 UNIT/ML Solostar Pen INJECT 60 UNITS INTO THE SKIN EVERY MORNING. 04/26/19   Opalski, Deborah, DO  losartan (COZAAR) 25 MG tablet TAKE 1 TABLET BY MOUTH DAILY. 01/12/19   [name], DO  Multiple Vitamin (MULTIVITAMIN) tablet Take 1 tablet by mouth daily.    [provider]  niacin (NIASPAN) 1000 MG CR tablet Take 1 tablet (1,000 mg total) by mouth at bedtime. An hour after 81 mg ASA 12/29/16   Opalski, Deborah, DO  ONE TOUCH ULTRA TEST test strip USE 1 STRIP TO CHECK GLUCOSE THREE TIMES DAILY AS NEEDED 11/07/18   [provider]                                                                                                                                    Past Surgical History Past Surgical History:  Procedure Laterality Date   CHOLECYSTECTOMY  03/06/1996   colomscopy     DILATATION & CURETTAGE/HYSTEROSCOPY WITH MYOSURE N/A 07/04/2018   Procedure: DILATATION & CURETTAGE/HYSTEROSCOPY WITH MYOSURE;  Surgeon: Salvadore Dom, MD;  Location: [place];  Service: Gynecology;  Laterality: N/A;   MEDIPORT INSERTION, DOUBLE     MEDIPORT REMOVAL     Family History Family History  Problem Relation Age of Onset   Cancer Mother 69       colon   Hypertension Mother    Osteoporosis Mother    Heart attack Father    Heart disease Sister     Alcohol abuse Maternal Grandfather     Social History Social History   Tobacco Use   Smoking status: Former Smoker    Packs/day: 0.50    Years: 4.00    Pack years: 2.00    Types: Cigarettes    Quit date: 12/21/1968    Years since quitting: 50.5   Smokeless tobacco: Never Used  Tobacco comment: as teenager  Substance Use Topics   Alcohol use: No   Drug use: No   Allergies Dilaudid [hydromorphone hcl], Doxycycline, Metronidazole, and Pseudoephedrine hcl  Review of Systems Review of Systems All other systems are reviewed and are negative for acute change except as noted in the HPI  Physical Exam Vital Signs  I have reviewed the triage vital signs BP (!) 145/48 (BP Location: Right Arm)    Pulse 88    Temp 97.8 F (36.6 C) (Oral)    Resp 18    Ht 5\' 7"  (1.702 m)    Wt 105.2 kg    LMP 12/21/1998 (Approximate)    SpO2 99%    BMI 36.34 kg/m   Physical Exam Vitals signs reviewed.  Constitutional:      General: She is not in acute distress.    Appearance: She is well-developed. She is not diaphoretic.  HENT:     Head: Normocephalic and atraumatic.     Right Ear: External ear normal.     Left Ear: External ear normal.     Nose: Nose normal.  Eyes:     General: No scleral icterus.    Conjunctiva/sclera: Conjunctivae normal.  Neck:     Musculoskeletal: Normal range of motion.     Trachea: Phonation normal.  Cardiovascular:     Rate and Rhythm: Normal rate. Rhythm regularly irregular.  Pulmonary:     Effort: Pulmonary effort is normal. No respiratory distress.     Breath sounds: No stridor.  Abdominal:     General: There is no distension.     Tenderness: There is abdominal tenderness in the right upper quadrant, epigastric area and periumbilical area. There is no guarding or rebound. Negative signs include Murphy's sign, Rovsing's sign and McBurney's sign.  Musculoskeletal: Normal range of motion.  Neurological:     Mental Status: She is alert and oriented to person,  place, and time.  Psychiatric:        Behavior: Behavior normal.     ED Results and Treatments Labs (all labs ordered are listed, but only abnormal results are displayed) Labs Reviewed  CBC WITH DIFFERENTIAL/PLATELET - Abnormal; Notable for the following components:      Result Value   Platelets 136 (*)    Neutro Abs 8.2 (*)    All other components within normal limits  COMPREHENSIVE METABOLIC PANEL - Abnormal; Notable for the following components:   Sodium 132 (*)    Chloride 96 (*)    Glucose, Bld 182 (*)    AST 210 (*)    ALT 104 (*)    Alkaline Phosphatase 154 (*)    Total Bilirubin 2.3 (*)    All other components within normal limits  LIPASE, BLOOD - Abnormal; Notable for the following components:   Lipase 1,498 (*)    All other components within normal limits  SARS CORONAVIRUS 2 (HOSP ORDER, PERFORMED IN [place] [facility] VIA ABBOTT ID)  EKG  EKG Interpretation  Date/Time:  Wednesday June 21 2019 04:34:32 EDT Ventricular Rate:  61 PR Interval:    QRS Duration: 140 QT Interval:  442 QTC Calculation: 446 R Axis:   -51 Text Interpretation:  Sinus rhythm Atrial premature complexes RBBB and LAFB Probable left ventricular hypertrophy Lateral infarct, old Baseline wander in lead(s) II Otherwise no significant change Confirmed by Addison Lank [id number]) on 06/21/2019 4:39:09 AM      Radiology Ct Abdomen Pelvis W Contrast  Result Date: 06/21/2019 CLINICAL DATA:  Abdominal pain. History of ERCP and pancreatic stent placement EXAM: CT ABDOMEN AND PELVIS WITH CONTRAST TECHNIQUE: Multidetector CT imaging of the abdomen and pelvis was performed using the standard protocol following bolus administration of intravenous contrast. CONTRAST:  128mL OMNIPAQUE IOHEXOL 300 MG/ML  SOLN COMPARISON:  10/18/2018 FINDINGS: Lower chest:  Mild atelectasis. Hepatobiliary: No focal  liver abnormality.Cholecystectomy with dilated biliary tree. There was ERCP with sphincterotomy performed 06/20/2019, correlating with mild pneumobilia. No obstructive process is seen. Pancreas: Generalized peripancreatic edema and pancreas expansion. There is a stent within the main pancreatic duct in expected position, with retention catheter at the level of the duodenal lumen. No necrosis or organized collection. Spleen: Unremarkable Adrenals/Urinary Tract: Negative adrenals. No hydronephrosis or stone. Unremarkable bladder. Stomach/Bowel: Colonic diverticulosis with mild fat inflammation around a thickened sigmoid diverticulum. No perforation. No appendicitis. Appendicolith is present. Mid duodenal diverticulum. Vascular/Lymphatic: No acute vascular abnormality. No mass or adenopathy. Atherosclerotic calcification of the aorta and iliacs. Reproductive:No pathologic findings. Other: No ascites or pneumoperitoneum. Musculoskeletal: No acute abnormalities. Thoracolumbar degenerative disease greatest at L5-S1. IMPRESSION: 1. Acute edematous pancreatitis without necrosis or organized collection. 2. Recently placed pancreatic stent is in expected position. 3. Generalized dilatation of the biliary tree which was recently evaluated by ERCP. 4. Mild uncomplicated sigmoid diverticulitis incidentally noted. Electronically Signed   By: Monte Fantasia M.D.   On: 06/21/2019 06:04    Pertinent labs & imaging results that were available during my care of the patient were reviewed by me and considered in my medical decision making (see chart for details).  Medications Ordered in ED Medications  piperacillin-tazobactam (ZOSYN) IVPB 3.375 g (3.375 g Intravenous New Bag/Given 06/21/19 0629)  sodium chloride 0.9 % bolus 1,000 mL (has no administration in time range)    Followed by  sodium chloride 0.9 % bolus 1,000 mL (has no administration in time range)    Followed by  0.9 %  sodium chloride infusion (has no  administration in time range)  sodium chloride 0.9 % bolus 1,000 mL ( Intravenous Stopped 06/21/19 0525)  ondansetron (ZOFRAN) injection 4 mg (4 mg Intravenous Given 06/21/19 0422)  morphine 4 MG/ML injection 4 mg (4 mg Intravenous Given 06/21/19 0422)  morphine 4 MG/ML injection 4 mg (4 mg Intravenous Given 06/21/19 0501)  sodium chloride 0.9 % bolus 1,000 mL ( Intravenous Rate/Dose Verify 06/21/19 0647)  iohexol (OMNIPAQUE) 300 MG/ML solution 100 mL (100 mLs Intravenous Contrast Given 06/21/19 0534)  Procedures Procedures CRITICAL CARE Performed by: Grayce Sessions Marwa Fuhrman Total critical care time: 40 minutes Critical care time was exclusive of separately billable procedures and treating other patients. Critical care was necessary to treat or prevent imminent or life-threatening deterioration. Critical care was time spent personally by me on the following activities: development of treatment plan with patient and/or surrogate as well as nursing, discussions with consultants, evaluation of patient's response to treatment, examination of patient, obtaining history from patient or surrogate, ordering and performing treatments and interventions, ordering and review of laboratory studies, ordering and review of radiographic studies, pulse oximetry and re-evaluation of patient's condition.   (including critical care time)  Medical Decision Making / ED Course I have reviewed the nursing notes for this encounter and the patient's prior records (if available in EHR or on provided paperwork).   Jasmine Davila was evaluated in Emergency Department on 06/21/2019 for the symptoms described in the history of present illness. She was evaluated in the context of the global COVID-19 pandemic, which necessitated consideration that the patient might be at risk for infection with the  SARS-CoV-2 virus that causes COVID-19. Institutional protocols and algorithms that pertain to the evaluation of patients at risk for COVID-19 are in a state of rapid change based on information released by regulatory bodies including the CDC and federal and state organizations. These policies and algorithms were followed during the patient's care in the ED.  CC: Epigastric abdominal pain s/p pancreatic stenting  Record review:  Confirmed ERCP and stent placement on care everywhere  Initial concern for: Postprocedural pancreatitis. Would like to rule out biliary leak/perforation.  Low suspicion for: Cardiac etiology Other intra-inflammatory/infectious process or bowel obstruction.  Work up as above, notable for:  Patient noted to have elevated LFTs likely from recent ERCP and pancreatitis. CT consistent with edematous pancreatitis.  No necrosis.  No leak or perforation. **Incidentally, sigmoid diverticulitis is noted.   **Labs & imaging results that were available during my care of the patient were reviewed by me and considered in my medical decision making.    Management: 2 L IV fluid IV pain medicine Zofran Patient was started on Zosyn to cover both pancreatitis and diverticulitis.   Reassessment:  Pain and nausea improved.   Patient will be admitted to medicine for continued management       Final Clinical Impression(s) / ED Diagnoses Final diagnoses:  Acute biliary pancreatitis without infection or necrosis  Diverticulitis      This chart was dictated using voice recognition software.  Despite best efforts to proofread,  errors can occur which can change the documentation meaning.     Fatima Blank, MD 06/21/19 5997    Fatima Blank, MD 07/04/19 [phone]

## 2019-06-21 NOTE — H&P (Signed)
History and Physical    Jasmine Davila WNI:[id number] DOB: 12/10/1951 DOA: 06/21/2019  PCP: [name], DO   Patient coming from: Home via [facility]  Chief Complaint: Abdominal Pain and Distention; Bloating  HPI: Jasmine Davila is a 68 y.o. female with medical history significant for but not limited to history of recurrent pancreatitis, anemia, history of B-cell lymphoma non-Hodgkin's type in remission, history of diabetes mellitus type 2 that is insulin-dependent complicated by diabetic neuropathy, history of GERD, history of obesity, sleep apnea on CPAP, vitamin B12 deficiency, Struve thrombocytopenia, hyperlipidemia, hypertension, as well as other comorbidities who presents with chief complaint of abdominal pain and distention.  Of note patient was recently in [facility] for evaluation for recurrent pancreatitis and underwent ERCP and pancreatic stenting performed yesterday afternoon.  Subsequently patient went home and felt okay but then this morning woke up at 2 AM to go to the bathroom and doubled over in pain.  Patient states that the pain radiated to her right upper quadrant and right breast similar to her prior pancreatitis episodes.  She took a Lortab to try and get relief however 3 AM pain significantly worsened and she felt nauseous and came to emergency room for further evaluation.  She was given Zofran x2 and then nauseous but subsequently vomited here while she presented in our hospital.  She states the pain when she first woke up with a 10 out of 10 in severity now is a 4 out of 5 in severity.  States that the pain is in the epigastric area and feels a lot like pressure.  States that she has been burping but has no gas.  Denies any diarrhea, shortness of breath or chest pain.  Also complained of left lower quadrant pain which is sharp in severity which is unrelated to her epigastric pain.  TRH was called to admit this patient for acute on chronic  pancreatitis as well as acute mild sigmoid diverticulitis.  Gastroenterology was consulted for further evaluation recommendations  ED Course: Was seen at [facility] and given 3 L of IV fluid boluses and then started on 125 mL's as well as given IV Zofran as well as morphine and IV Zosyn.  She underwent a CT of the abdomen pelvis which was consistent with acute edematous pancreatitis without necrosis or organized collection and a recently placed pancreatic stent was in expected position.  There is also generalized dilatation of the biliary tree and mild uncomplicated sigmoid diverticulitis which was also incidentally noted in the CT abdomen and pelvis.  Review of Systems: As per HPI otherwise 10 point review of systems negative.   Past Medical History:  Diagnosis Date   Abnormal EKG    hx of right bundle branch block   Acute pancreatitis    HX OF   Anemia yrs ago   Cancer [facility]) dx 2016   B cell lymphoma- Non Hodgkins in remission   Diabetes mellitus ([place])    TYPE 2   Diabetic neuropathy (HCC)    Diabetic neuropathy (HCC)    Endometrial disorder    GERD (gastroesophageal reflux disease)    Headache    tension or sinus   Hypertension    Malignant lymphoma (HCC)    Morbid obesity (HCC)    Peripheral neuropathy    BOTH FEET, SLIGHT NEUROPATHY IN HANDS   Sleep apnea    USES CPAP at times   Vitamin B 12 deficiency    Past Surgical History:  Procedure Laterality Date  CHOLECYSTECTOMY  03/06/1996   colomscopy     DILATATION & CURETTAGE/HYSTEROSCOPY WITH MYOSURE N/A 07/04/2018   Procedure: DILATATION & CURETTAGE/HYSTEROSCOPY WITH MYOSURE;  Surgeon: Salvadore Dom, MD;  Location: [place];  Service: Gynecology;  Laterality: N/A;   MEDIPORT INSERTION, DOUBLE     MEDIPORT REMOVAL     SOCIAL HISTORY   reports that she quit smoking about 50 years ago. Her smoking use included cigarettes. She has a 2.00 pack-year smoking history. She has never used  smokeless tobacco. She reports that she does not drink alcohol or use drugs.  Allergies  Allergen Reactions   Dilaudid [Hydromorphone Hcl] Other (See Comments)    anxious   Doxycycline Other (See Comments)    Pancreatic issues   Metronidazole Other (See Comments)    Cream  Burned face   Pseudoephedrine Hcl Other (See Comments)    Pt unsure   Family History  Problem Relation Age of Onset   Cancer Mother 35       colon   Hypertension Mother    Osteoporosis Mother    Heart attack Father    Heart disease Sister    Alcohol abuse Maternal Grandfather    Prior to Admission medications   Medication Sig Start Date End Date Taking? Authorizing Provider  aspirin EC 81 MG tablet Take 81 mg by mouth at bedtime.    Yes [provider]  Cholecalciferol (VITAMIN D-3) 5000 units TABS Take 1 tablet by mouth daily.   Yes [provider]  cyanocobalamin (,VITAMIN B-12,) 1000 MCG/ML injection Inject 0.5 mLs (500 mcg total) into the muscle every 30 (thirty) days. 04/05/18  Yes Opalski, Deborah, DO  famotidine (PEPCID) 20 MG tablet TAKE 1 TABLET BY MOUTH 2 TIMES DAILY. 03/27/19  Yes Opalski, Neoma Laming, DO  folic acid (FOLVITE) 458 MCG tablet Take 400 mcg by mouth daily.   Yes [provider]  gabapentin (NEURONTIN) 300 MG capsule TAKE 4 CAPSULES BY MOUTH 3 TIMES DAILY. Patient taking differently: Take 1,200 mg by mouth 3 (three) times daily.  02/27/19  Yes Opalski, Deborah, DO  HUMALOG KWIKPEN 100 UNIT/ML KwikPen INJECT 20 UNITS INTO THE SKIN DAILY WITH LARGEST MEAL Patient taking differently: Inject 20 Units into the skin every morning.  03/13/19  Yes Opalski, Neoma Laming, DO  HYDROcodone-acetaminophen (NORCO/VICODIN) 5-325 MG tablet Take 1 tablet by mouth every 6 (six) hours as needed for moderate pain.   Yes [provider]  LANTUS SOLOSTAR 100 UNIT/ML Solostar Pen INJECT 60 UNITS INTO THE SKIN EVERY MORNING. Patient taking differently: Inject 60 Units into the skin  daily.  04/26/19  Yes Opalski, Deborah, DO  losartan (COZAAR) 25 MG tablet TAKE 1 TABLET BY MOUTH DAILY. 01/12/19  Yes Opalski, Neoma Laming, DO  Multiple Vitamin (MULTIVITAMIN) tablet Take 1 tablet by mouth daily.   Yes [provider]  niacin (NIASPAN) 1000 MG CR tablet Take 1 tablet (1,000 mg total) by mouth at bedtime. An hour after 81 mg ASA 12/29/16  Yes Opalski, Deborah, DO  fluticasone (FLONASE) 50 MCG/ACT nasal spray PLACE 1 SPRAY INTO BOTH NOSTRILS DAILY AS NEEDED FOR ALLERGIES OR RHINITIS. 01/30/19   Mina Marble D, NP  ONE TOUCH ULTRA TEST test strip USE 1 STRIP TO CHECK GLUCOSE THREE TIMES DAILY AS NEEDED 11/07/18   [provider]   Physical Exam: Vitals:   06/21/19 0707 06/21/19 0824 06/21/19 0850 06/21/19 0930  BP:  (!) 105/55 (!) 109/59 (!) 104/55  Pulse: 62 60 72 (!) 51  Resp:  13 18 18 18   Temp:   98.2 F (36.8 C) 97.8 F (36.6 C)  TempSrc:    Oral  SpO2: (!) 87% 99% 100% 99%  Weight:      Height:       Constitutional: WN/WD Morbidly obese Caucasian female in NAD and appears calm but uncomfortable  Eyes: Lids and conjunctivae normal, sclerae anicteric  ENMT: External Ears, Nose appear normal. Grossly normal hearing. Mucous membranes are a little dry Neck: Appears normal, supple, no cervical masses, normal ROM, no appreciable thyromegaly; no JVD noted  Respiratory: Diminished to auscultation bilaterally, no wheezing, rales, rhonchi or crackles. Normal respiratory effort and patient is not tachypenic. No accessory muscle use.  Cardiovascular: Slightly Bradycardic rate, no appreciable murmurs / rubs / gallops. S1 and S2 auscultated. Trace LLE extremity edema.  Abdomen: Soft, tender to palpate in mid epigastric, Right Upper Quadrant, and Left Lower Quadrant, Distended 2/2 to body habitus. No masses palpated. No appreciable hepatosplenomegaly. Bowel sounds positive x4.  GU: Deferred. Musculoskeletal: No clubbing / cyanosis of digits/nails. No joint deformity upper  and lower extremities. Skin: No rashes, lesions, ulcers on a limited skin evaluation. No induration; Warm and dry.  Neurologic: CN 2-12 grossly intact with no focal deficits. Romberg sign and cerebellar reflexes not assessed.  Psychiatric: Normal judgment and insight. Alert and oriented x 3. Normal mood and appropriate affect.   Labs on Admission: I have personally reviewed following labs and imaging studies  CBC: Recent Labs  Lab 06/21/19 0421  WBC 9.6  NEUTROABS 8.2*  HGB 12.7  HCT 38.0  MCV 87.4  PLT 834*   Basic Metabolic Panel: Recent Labs  Lab 06/21/19 0421  NA 132*  K 3.9  CL 96*  CO2 26  GLUCOSE 182*  BUN 12  CREATININE 0.71  CALCIUM 9.0   GFR: Estimated Creatinine Clearance: 83.9 mL/min (by C-G formula based on SCr of 0.71 mg/dL). Liver Function Tests: Recent Labs  Lab 06/21/19 0421  AST 210*  ALT 104*  ALKPHOS 154*  BILITOT 2.3*  PROT 7.1  ALBUMIN 3.8   Recent Labs  Lab 06/21/19 0421  LIPASE 1,498*   No results for input(s): AMMONIA in the last 168 hours. Coagulation Profile: No results for input(s): INR, PROTIME in the last 168 hours. Cardiac Enzymes: No results for input(s): CKTOTAL, CKMB, CKMBINDEX, TROPONINI in the last 168 hours. BNP (last 3 results) No results for input(s): PROBNP in the last 8760 hours. HbA1C: No results for input(s): HGBA1C in the last 72 hours. CBG: No results for input(s): GLUCAP in the last 168 hours. Lipid Profile: No results for input(s): CHOL, HDL, LDLCALC, TRIG, CHOLHDL, LDLDIRECT in the last 72 hours. Thyroid Function Tests: No results for input(s): TSH, T4TOTAL, FREET4, T3FREE, THYROIDAB in the last 72 hours. Anemia Panel: No results for input(s): VITAMINB12, FOLATE, FERRITIN, TIBC, IRON, RETICCTPCT in the last 72 hours. Urine analysis:    Component Value Date/Time   COLORURINE YELLOW 10/19/2018 0605   APPEARANCEUR CLEAR 10/19/2018 0605   LABSPEC 1.033 (H) 10/19/2018 0605   PHURINE 6.0 10/19/2018 0605     GLUCOSEU NEGATIVE 10/19/2018 0605   HGBUR SMALL (A) 10/19/2018 0605   BILIRUBINUR NEGATIVE 10/19/2018 0605   BILIRUBINUR negative 06/08/2018 1116   KETONESUR NEGATIVE 10/19/2018 0605   PROTEINUR NEGATIVE 10/19/2018 0605   UROBILINOGEN 0.2 06/08/2018 1116   NITRITE NEGATIVE 10/19/2018 0605   LEUKOCYTESUR NEGATIVE 10/19/2018 0605   Sepsis Labs: !!!!!!!!!!!!!!!!!!!!!!!!!!!!!!!!!!!!!!!!!!!! @LABRCNTIP (procalcitonin:4,lacticidven:4) ) Recent Results (from the past 240 hour(s))  SARS Coronavirus  2 (Hosp order,Performed in [facility] lab via [employer] ID)     Status: None   Collection Time: 06/21/19  6:15 AM   Specimen: Dry Nasal Swab ([employer] ID Now)  Result Value Ref Range Status   SARS Coronavirus 2 ([employer] ID Now) NEGATIVE NEGATIVE Final    Comment: (NOTE) Interpretive Result Comment(s): COVID 19 Positive SARS CoV 2 target nucleic acids are DETECTED. The SARS CoV 2 RNA is generally detectable in upper and lower respiratory specimens during the acute phase of infection.  Positive results are indicative of active infection with SARS CoV 2.  Clinical correlation with patient history and other diagnostic information is necessary to determine patient infection status.  Positive results do not rule out bacterial infection or coinfection with other viruses. The expected result is Negative. COVID 19 Negative SARS CoV 2 target nucleic acids are NOT DETECTED. The SARS CoV 2 RNA is generally detectable in upper and lower respiratory specimens during the acute phase of infection.  Negative results do not preclude SARS CoV 2 infection, do not rule out coinfections with other pathogens, and should not be used as the sole basis for treatment or other patient management decisions.  Negative results must be combined with clinical  observations, patient history, and epidemiological information. The expected result is Negative. Invalid Presence or absence of SARS CoV 2 nucleic acids cannot  be determined. Repeat testing was performed on the submitted specimen and repeated Invalid results were obtained.  If clinically indicated, additional testing on a new specimen with an alternate test methodology [phone]) is advised.  The SARS CoV 2 RNA is generally detectable in upper and lower respiratory specimens during the acute phase of infection. The expected result is Negative. Fact Sheet for Patients:  GolfingFamily.no Fact Sheet for Healthcare Providers: https://www.hernandez-brewer.com/ This test is not yet approved or cleared by the [place] FDA and has been authorized for detection and/or diagnosis of SARS CoV 2 by FDA under an Emergency Use Authorization (EUA).  This EUA will remain in effect (meaning this test can be used) for the duration of the COVID19 d eclaration under Section 564(b)(1) of the Act, 21 U.S.C. section 6674761783 3(b)(1), unless the authorization is terminated or revoked sooner. Performed at [facility], [address] [place], [place] [address]     Radiological Exams on Admission: Ct Abdomen Pelvis W Contrast  Result Date: 06/21/2019 CLINICAL DATA:  Abdominal pain. History of ERCP and pancreatic stent placement EXAM: CT ABDOMEN AND PELVIS WITH CONTRAST TECHNIQUE: Multidetector CT imaging of the abdomen and pelvis was performed using the standard protocol following bolus administration of intravenous contrast. CONTRAST:  1104mL OMNIPAQUE IOHEXOL 300 MG/ML  SOLN COMPARISON:  10/18/2018 FINDINGS: Lower chest:  Mild atelectasis. Hepatobiliary: No focal liver abnormality.Cholecystectomy with dilated biliary tree. There was ERCP with sphincterotomy performed 06/20/2019, correlating with mild pneumobilia. No obstructive process is seen. Pancreas: Generalized peripancreatic edema and pancreas expansion. There is a stent within the main pancreatic duct in expected position, with retention catheter at the level of the  duodenal lumen. No necrosis or organized collection. Spleen: Unremarkable Adrenals/Urinary Tract: Negative adrenals. No hydronephrosis or stone. Unremarkable bladder. Stomach/Bowel: Colonic diverticulosis with mild fat inflammation around a thickened sigmoid diverticulum. No perforation. No appendicitis. Appendicolith is present. Mid duodenal diverticulum. Vascular/Lymphatic: No acute vascular abnormality. No mass or adenopathy. Atherosclerotic calcification of the aorta and iliacs. Reproductive:No pathologic findings. Other: No ascites or pneumoperitoneum. Musculoskeletal: No acute abnormalities. Thoracolumbar degenerative disease greatest at L5-S1. IMPRESSION: 1. Acute  edematous pancreatitis without necrosis or organized collection. 2. Recently placed pancreatic stent is in expected position. 3. Generalized dilatation of the biliary tree which was recently evaluated by ERCP. 4. Mild uncomplicated sigmoid diverticulitis incidentally noted. Electronically Signed   By: Monte Fantasia M.D.   On: 06/21/2019 06:04   EKG: Independently reviewed.  Showed a sinus rhythm at a rate of 61 with atrial premature complexes as well as a right bundle branch block.  There is no evidence of ST elevation or depression on my interpretation  Assessment/Plan Active Problems:   Hypertension associated with diabetes ([place])   Chronic pancreatitis ([place])   H/o Cancer:   B- cell lymphoma (spleen and lung) - non-Hodgkin's   Diabetic neuropathy - exaccerbated by chemo   Hyperlipidemia associated with type 2 diabetes mellitus ([place])   History of decreased platelet count   Acute on chronic pancreatitis (HCC)   Diabetic peripheral neuropathy associated with type 2 diabetes mellitus (HCC)   History of malignant lymphoma   Neuropathy   Vitamin D insufficiency   Acute pancreatitis   Pancreatitis   Acute diverticulitis   Abnormal LFTs   Hyperbilirubinemia   Left leg swelling   Hyponatremia   Hypochloremia   Obesity  Acute on  Chronic and Recurrent Pancreatitis related to Recent Stenting and ERCP yesterday -Admit to inpatient telemetry -N.p.o. except meds and sips and diet advancement slowly as tolerated but for now we will hold off on diet management today -IV fluid resuscitation and was given 3 L in the ED and then started on IV normal saline at rate of 125 mL's per hour and this will be reduced to 100 mL/hr -CT Abdomen and Pelvis showed  consistent with acute edematous pancreatitis without necrosis or organized collection and a recently placed pancreatic stent was in expected position -Lipase on admission was 1498 and will trend and repeat in the a.m. -Gastroenterology was consulted for further evaluation recommendations -Continue with bowel rest and symptomatic care and treatment -Continue with antiemetics with IV Zofran and if necessary will need to place on IV Phenergan -Continue with pain control Norco 1 tab every 6 as needed for moderate pain and IV Morphine 3 PRN for moderate pain and severe pain  Acute Diverticulitis -Mild on CT scan and incidental next-we will place on IV Zosyn as patient was still having some pain -Treatment as above and patient will be made n.p.o. and have bowel rest and fluid resuscitation -Cannot use Cipro Flagyl due to patient allergy to metronidazole  Abnormal LFTs -Likely related to stent placement -Patient's AST was 210 and ALT was 104 -Continue monitor and trend and if worsening will need right upper quadrant ultrasound -Repeat CMP in a.m. -Gastroenterology was consulted for further evaluation recommendations  Left Lower Extremity Swelling -Check Venous Duplex to r/o DVT  Hyperbilirubinemia  -Patient's T bili was 2.3 on admission -Likely related to recent stent placement -Continue monitor and trend -GI consulted for further evaluation recommendations -Repeat CMP in the a.m.  Hyponatremia/Hypochloremia -Patient sodium on admission was 132 and chloride was 96 -Continue  with IV fluid at 100 mL's per hour -Continue to monitor and trend and repeat CMP in a.m.  HLD -Continue niacin thousand milligrams p.o. nightly after ASA -Currently avoiding statins given her recent elevation in LFTs  Diabetes Mellitus Type 2 associated with Diabetic Peripheral Neuropathy -We will resume home regimen with Lantus Solostar except at a reduced dose at 45 units subcu nightly -Since patient is n.p.o. we will place the patient on sensitive  NovoLog sliding scale every 4h -Repeat hemoglobin A1c -Continue monitor CBGs carefully -Continue to adjust insulin regimen -Continue with gabapentin 1200 mg 3 times daily for neuropathy  B12 Deficiency  -Currently gets cyanocobalamin 500 mcg injections every 30 days -Currently will hold and resume his outpatient -Continue monitor  Thrombocytopenia -His platelet count on admission was 136 -Likely related to her B-cell lymphoma is currently enrolled admission -We will monitor for signs and symptoms of bleeding -Currently there is none  B Cell Lymphoma -Non-Hodgkins -Currently in remission  HTN -Blood pressures were on the lower side so we will hold antihypertensives including losartan 25 mg -Continue monitor blood pressures per protocol  GERD -C/w Famotidine 20 mg po BID  OSA -Resume home CPAP  Obesity -Estimated body mass index is 36.34 kg/m as calculated from the following:   Height as of this encounter: 5\' 7"  (1.702 m).   Weight as of this encounter: 105.2 kg. -Weight Loss and Dietary Counseling given  DVT prophylaxis: Enoxaparin 50 mg po qHS Code Status: FULL CODE  Family Communication: No family present at bedside  Disposition Plan: Remain Inpatient until patient improves clinically and is able to tolerate po  Consults called: Gastroenterology Dr. Alessandra Bevels Admission status: Inpatient Telemetry   Severity of Illness: The appropriate patient status for this patient is INPATIENT. Inpatient status is judged to be  reasonable and necessary in order to provide the required intensity of service to ensure the patient's safety. The patient's presenting symptoms, physical exam findings, and initial radiographic and laboratory data in the context of their chronic comorbidities is felt to place them at high risk for further clinical deterioration. Furthermore, it is not anticipated that the patient will be medically stable for discharge from the hospital within 2 midnights of admission. The following factors support the patient status of inpatient.   " The patient's presenting symptoms include Abdominal Pain and Bloating. " The worrisome physical exam findings include Abdominal Tenderness. " The initial radiographic and laboratory data are worrisome because of Pancreatitis and Diverticulitis . " The chronic co-morbidities are listed as above in HPI.  * I certify that at the point of admission it is my clinical judgment that the patient will require inpatient hospital care spanning beyond 2 midnights from the point of admission due to high intensity of service, high risk for further deterioration and high frequency of surveillance required.Kerney Elbe, D.O. [facility] is on [place]  If 7PM-7AM, please contact night-coverage [email or web address] Password [email or web address]  06/21/2019, 11:30 AM

## 2019-06-21 NOTE — ED Notes (Signed)
Pt care transferred to [facility] staff at bedside.

## 2019-06-21 NOTE — Consult Note (Signed)
Referring Provider: [place] Primary Care Physician:  [name], DO Primary Gastroenterologist:  Dr. Penelope Coop   Reason for Consultation: Pancreatitis, diverticulitis  HPI: Jasmine Davila is a 68 y.o. female with past medical history of B-cell lymphoma, recurrent pancreatitis since 2008.  She is status post cholecystectomy in 1997.  Possible idiopathic pancreatitis.  Negative work-up in the past.  Possible sphincter of Oddi dysfunction.  She underwent ERCP yesterday at [facility].  She was found to have mild dilation of main pancreatic duct consistent with type I pancreatic sphincter of Oddi and pancreatic sphincterotomy was performed and pancreatic stent was placed.  Last night she started having severe left upper quadrant and epigastric abdominal pain.  She presented to ED here.  Was found to have elevated lipase at 1498 as well as CT scan showing pancreatitis and incidental finding of diverticulitis.  Mild elevated LFTs.  No leukocytosis.  She is feeling better since admission.  Abdominal pain improving with as needed pain medication.  Denies diarrhea or constipation.  Had one episode of nausea and vomiting yesterday.  Denies seeing any blood in the stool or black stool.   Last colonoscopy in October 2014 at [facility] showed hyperplastic sigmoid colon polyps.  Past Medical History:  Diagnosis Date  . Abnormal EKG    hx of right bundle branch block  . Acute pancreatitis    HX OF  . Anemia yrs ago  . Cancer ([place]) dx 2016   B cell lymphoma- Non Hodgkins in remission  . Diabetes mellitus ([place])    TYPE 2  . Diabetic neuropathy ([place])   . Diabetic neuropathy ([place])   . Endometrial disorder   . GERD (gastroesophageal reflux disease)   . Headache    tension or sinus  . Hypertension   . Malignant lymphoma ([place])   . Morbid obesity ([place])   . Peripheral neuropathy    BOTH FEET, SLIGHT NEUROPATHY IN HANDS  . Sleep apnea    USES CPAP at times  . Vitamin B 12 deficiency      Past Surgical History:  Procedure Laterality Date  . CHOLECYSTECTOMY  03/06/1996  . colomscopy    . DILATATION & CURETTAGE/HYSTEROSCOPY WITH MYOSURE N/A 07/04/2018   Procedure: DILATATION & CURETTAGE/HYSTEROSCOPY WITH MYOSURE;  Surgeon: Salvadore Dom, MD;  Location: [facility];  Service: Gynecology;  Laterality: N/A;  . MEDIPORT INSERTION, DOUBLE    . MEDIPORT REMOVAL      Prior to Admission medications   Medication Sig Start Date End Date Taking? Authorizing Provider  aspirin EC 81 MG tablet Take 81 mg by mouth at bedtime.    Yes [provider]  Cholecalciferol (VITAMIN D-3) 5000 units TABS Take 1 tablet by mouth daily.   Yes [provider]  cyanocobalamin (,VITAMIN B-12,) 1000 MCG/ML injection Inject 0.5 mLs (500 mcg total) into the muscle every 30 (thirty) days. 04/05/18  Yes Opalski, Deborah, DO  famotidine (PEPCID) 20 MG tablet TAKE 1 TABLET BY MOUTH 2 TIMES DAILY. 03/27/19  Yes Opalski, Neoma Laming, DO  folic acid (FOLVITE) 456 MCG tablet Take 400 mcg by mouth daily.   Yes [provider]  gabapentin (NEURONTIN) 300 MG capsule TAKE 4 CAPSULES BY MOUTH 3 TIMES DAILY. Patient taking differently: Take 1,200 mg by mouth 3 (three) times daily.  02/27/19  Yes Opalski, Deborah, DO  HUMALOG KWIKPEN 100 UNIT/ML KwikPen INJECT 20 UNITS INTO THE SKIN DAILY WITH LARGEST MEAL Patient taking differently: Inject 20 Units into the skin every morning.  03/13/19  Yes Opalski, Neoma Laming, DO  HYDROcodone-acetaminophen (NORCO/VICODIN) 5-325 MG tablet Take 1 tablet by mouth every 6 (six) hours as needed for moderate pain.   Yes [provider]  LANTUS SOLOSTAR 100 UNIT/ML Solostar Pen INJECT 60 UNITS INTO THE SKIN EVERY MORNING. Patient taking differently: Inject 60 Units into the skin daily.  04/26/19  Yes Opalski, Deborah, DO  losartan (COZAAR) 25 MG tablet TAKE 1 TABLET BY MOUTH DAILY. 01/12/19  Yes Opalski, Neoma Laming, DO  Multiple Vitamin (MULTIVITAMIN)  tablet Take 1 tablet by mouth daily.   Yes [provider]  niacin (NIASPAN) 1000 MG CR tablet Take 1 tablet (1,000 mg total) by mouth at bedtime. An hour after 81 mg ASA 12/29/16  Yes Opalski, Deborah, DO  fluticasone (FLONASE) 50 MCG/ACT nasal spray PLACE 1 SPRAY INTO BOTH NOSTRILS DAILY AS NEEDED FOR ALLERGIES OR RHINITIS. 01/30/19   Danford, Valetta Fuller D, NP  ONE TOUCH ULTRA TEST test strip USE 1 STRIP TO CHECK GLUCOSE THREE TIMES DAILY AS NEEDED 11/07/18   [provider]    Scheduled Meds: Continuous Infusions: . sodium chloride 1,000 mL (06/21/19 1008)   PRN Meds:.morphine injection  Allergies as of 06/21/2019 - Review Complete 06/21/2019  Allergen Reaction Noted  . Dilaudid [hydromorphone hcl] Other (See Comments) 11/24/2016  . Doxycycline Other (See Comments) 11/24/2016  . Metronidazole Other (See Comments) 11/24/2016  . Pseudoephedrine hcl Other (See Comments)     Family History  Problem Relation Age of Onset  . Cancer Mother 33       colon  . Hypertension Mother   . Osteoporosis Mother   . Heart attack Father   . Heart disease Sister   . Alcohol abuse Maternal Grandfather     Social History   Socioeconomic History  . Marital status: Married    Spouse name: Not on file  . Number of children: 4  . Years of education: 70  . Highest education level: Not on file  Occupational History  . Occupation: retired from [employer]  Social Needs  . Financial resource strain: Not on file  . Food insecurity    Worry: Not on file    Inability: Not on file  . Transportation needs    Medical: Not on file    Non-medical: Not on file  Tobacco Use  . Smoking status: Former Smoker    Packs/day: 0.50    Years: 4.00    Pack years: 2.00    Types: Cigarettes    Quit date: 12/21/1968    Years since quitting: 50.5  . Smokeless tobacco: Never Used  . Tobacco comment: as teenager  Substance and Sexual Activity  . Alcohol use: No  . Drug use: No  . Sexual activity:  Not Currently    Birth control/protection: None  Lifestyle  . Physical activity    Days per week: Not on file    Minutes per session: Not on file  . Stress: Not on file  Relationships  . Social Herbalist on phone: Not on file    Gets together: Not on file    Attends religious service: Not on file    Active member of club or organization: Not on file    Attends meetings of clubs or organizations: Not on file    Relationship status: Not on file  . Intimate partner violence    Fear of current or ex partner: Not on file    Emotionally abused: Not on file    Physically  abused: Not on file    Forced sexual activity: Not on file  Other Topics Concern  . Not on file  Social History Narrative   Lives with husband in a one story home.  Has 3 living children.  One child died in a MVA.  Retired from [employer].     Education: high school.    Review of Systems: Review of Systems  Constitutional: Negative for chills and fever.  HENT: Negative for hearing loss and tinnitus.   Eyes: Negative for blurred vision and double vision.  Respiratory: Negative for cough and hemoptysis.   Cardiovascular: Negative for chest pain and palpitations.  Gastrointestinal: Positive for abdominal pain, heartburn, nausea and vomiting. Negative for blood in stool, constipation, diarrhea and melena.  Genitourinary: Negative for dysuria and urgency.  Musculoskeletal: Positive for back pain. Negative for myalgias.  Skin: Negative for itching and rash.  Neurological: Negative for seizures and loss of consciousness.  Endo/Heme/Allergies: Does not bruise/bleed easily.  Psychiatric/Behavioral: Negative for hallucinations. The patient is not nervous/anxious.     Physical Exam: Vital signs: Vitals:   06/21/19 0850 06/21/19 0930  BP: (!) 109/59 (!) 104/55  Pulse: 72 (!) 51  Resp: 18 18  Temp: 98.2 F (36.8 C) 97.8 F (36.6 C)  SpO2: 100% 99%   Physical Exam  Constitutional: She is oriented to  person, place, and time. She appears well-developed and well-nourished. No distress.  HENT:  Head: Atraumatic.  Mouth/Throat: Oropharynx is clear and moist. No oropharyngeal exudate.  Eyes: EOM are normal. No scleral icterus.  Neck: Normal range of motion. Neck supple.  Cardiovascular: Normal rate, regular rhythm and normal heart sounds.  Pulmonary/Chest: Effort normal and breath sounds normal. No respiratory distress.  Abdominal: Soft. Bowel sounds are normal. She exhibits no distension. There is abdominal tenderness. There is no rebound and no guarding.  Epigastric and left upper quadrant tenderness to palpation, no peritoneal signs  Neurological: She is alert and oriented to person, place, and time.  Skin: Skin is warm. No erythema.  Psychiatric: She has a normal mood and affect. Judgment and thought content normal.    GI:  Lab Results: Recent Labs    06/21/19 0421  WBC 9.6  HGB 12.7  HCT 38.0  PLT 136*   BMET Recent Labs    06/21/19 0421  NA 132*  K 3.9  CL 96*  CO2 26  GLUCOSE 182*  BUN 12  CREATININE 0.71  CALCIUM 9.0   LFT Recent Labs    06/21/19 0421  PROT 7.1  ALBUMIN 3.8  AST 210*  ALT 104*  ALKPHOS 154*  BILITOT 2.3*   PT/INR No results for input(s): LABPROT, INR in the last 72 hours.   Studies/Results: Ct Abdomen Pelvis W Contrast  Result Date: 06/21/2019 CLINICAL DATA:  Abdominal pain. History of ERCP and pancreatic stent placement EXAM: CT ABDOMEN AND PELVIS WITH CONTRAST TECHNIQUE: Multidetector CT imaging of the abdomen and pelvis was performed using the standard protocol following bolus administration of intravenous contrast. CONTRAST:  175mL OMNIPAQUE IOHEXOL 300 MG/ML  SOLN COMPARISON:  10/18/2018 FINDINGS: Lower chest:  Mild atelectasis. Hepatobiliary: No focal liver abnormality.Cholecystectomy with dilated biliary tree. There was ERCP with sphincterotomy performed 06/20/2019, correlating with mild pneumobilia. No obstructive process is seen.  Pancreas: Generalized peripancreatic edema and pancreas expansion. There is a stent within the main pancreatic duct in expected position, with retention catheter at the level of the duodenal lumen. No necrosis or organized collection. Spleen: Unremarkable Adrenals/Urinary Tract: Negative adrenals.  No hydronephrosis or stone. Unremarkable bladder. Stomach/Bowel: Colonic diverticulosis with mild fat inflammation around a thickened sigmoid diverticulum. No perforation. No appendicitis. Appendicolith is present. Mid duodenal diverticulum. Vascular/Lymphatic: No acute vascular abnormality. No mass or adenopathy. Atherosclerotic calcification of the aorta and iliacs. Reproductive:No pathologic findings. Other: No ascites or pneumoperitoneum. Musculoskeletal: No acute abnormalities. Thoracolumbar degenerative disease greatest at L5-S1. IMPRESSION: 1. Acute edematous pancreatitis without necrosis or organized collection. 2. Recently placed pancreatic stent is in expected position. 3. Generalized dilatation of the biliary tree which was recently evaluated by ERCP. 4. Mild uncomplicated sigmoid diverticulitis incidentally noted. Electronically Signed   By: Monte Fantasia M.D.   On: 06/21/2019 06:04    Impression/Plan: - Post ERCP pancreatitis. -History of chronic pancreatitis.  Status post ERCP with pancreatic sphincterotomy and pancreatic stent placement yesterday at [facility]. -Incidental finding of uncomplicated sigmoid diverticulitis. -Abnormal LFTs.    Recommendations ------------------------- -Increase IV hydration to 250 cc/h. -Continue antibiotics for diverticulitis. -Repeat KUB in 1 to 2 weeks.  If stent still in place, may need EGD to remove pancreatic duct stent. -Hopefully will be able to start diet tomorrow. -GI will follow  -Records from care everywhere reviewed briefly.      LOS: 0 days   Otis Brace  MD, FACP 06/21/2019, 10:55 AM  Contact #  [phone]

## 2019-06-22 DIAGNOSIS — K851 Biliary acute pancreatitis without necrosis or infection: Principal | ICD-10-CM

## 2019-06-22 DIAGNOSIS — K8500 Idiopathic acute pancreatitis without necrosis or infection: Secondary | ICD-10-CM

## 2019-06-22 LAB — COMPREHENSIVE METABOLIC PANEL
ALT: 186 U/L — ABNORMAL HIGH (ref 0–44)
AST: 128 U/L — ABNORMAL HIGH (ref 15–41)
Albumin: 3.2 g/dL — ABNORMAL LOW (ref 3.5–5.0)
Alkaline Phosphatase: 186 U/L — ABNORMAL HIGH (ref 38–126)
Anion gap: 9 (ref 5–15)
BUN: 7 mg/dL — ABNORMAL LOW (ref 8–23)
CO2: 22 mmol/L (ref 22–32)
Calcium: 8.1 mg/dL — ABNORMAL LOW (ref 8.9–10.3)
Chloride: 108 mmol/L (ref 98–111)
Creatinine, Ser: 0.59 mg/dL (ref 0.44–1.00)
GFR calc Af Amer: >60 mL/min (ref >60)
GFR calc non Af Amer: >60 mL/min (ref >60)
Glucose, Bld: 115 mg/dL — ABNORMAL HIGH (ref 70–99)
Potassium: 3.7 mmol/L (ref 3.5–5.1)
Sodium: 139 mmol/L (ref 135–145)
Total Bilirubin: 0.8 mg/dL (ref 0.3–1.2)
Total Protein: 6 g/dL — ABNORMAL LOW (ref 6.5–8.1)

## 2019-06-22 LAB — CBC
HCT: 36.7 % (ref 36.0–46.0)
Hemoglobin: 11.2 g/dL — ABNORMAL LOW (ref 12.0–15.0)
MCH: 28.7 pg (ref 26.0–34.0)
MCHC: 30.5 g/dL (ref 30.0–36.0)
MCV: 94.1 fL (ref 80.0–100.0)
Platelets: 115 10*3/uL — ABNORMAL LOW (ref 150–400)
RBC: 3.9 MIL/uL (ref 3.87–5.11)
RDW: 13.1 % (ref 11.5–15.5)
WBC: 6 10*3/uL (ref 4.0–10.5)
nRBC: 0 % (ref 0.0–0.2)

## 2019-06-22 LAB — LIPID PANEL
Cholesterol: 113 mg/dL (ref 0–200)
HDL: 38 mg/dL — ABNORMAL LOW (ref >40)
LDL Cholesterol: 60 mg/dL (ref 0–99)
Total CHOL/HDL Ratio: 3 RATIO
Triglycerides: 75 mg/dL (ref <150)
VLDL: 15 mg/dL (ref 0–40)

## 2019-06-22 LAB — GLUCOSE, CAPILLARY
Glucose-Capillary: 100 mg/dL — ABNORMAL HIGH (ref 70–99)
Glucose-Capillary: 105 mg/dL — ABNORMAL HIGH (ref 70–99)
Glucose-Capillary: 122 mg/dL — ABNORMAL HIGH (ref 70–99)
Glucose-Capillary: 128 mg/dL — ABNORMAL HIGH (ref 70–99)
Glucose-Capillary: 68 mg/dL — ABNORMAL LOW (ref 70–99)
Glucose-Capillary: 88 mg/dL (ref 70–99)
Glucose-Capillary: 93 mg/dL (ref 70–99)

## 2019-06-22 LAB — HEMOGLOBIN A1C
Hgb A1c MFr Bld: 5.8 % — ABNORMAL HIGH (ref 4.8–5.6)
Mean Plasma Glucose: 119.76 mg/dL

## 2019-06-22 LAB — PHOSPHORUS: Phosphorus: 2.1 mg/dL — ABNORMAL LOW (ref 2.5–4.6)

## 2019-06-22 LAB — TSH: TSH: 1.152 u[IU]/mL (ref 0.350–4.500)

## 2019-06-22 LAB — LIPASE, BLOOD: Lipase: 1101 U/L — ABNORMAL HIGH (ref 11–51)

## 2019-06-22 LAB — MAGNESIUM: Magnesium: 1.8 mg/dL (ref 1.7–2.4)

## 2019-06-22 MED ORDER — SODIUM CHLORIDE 0.9 % IV SOLN
1000.0000 mL | INTRAVENOUS | Status: DC
  Administered 2019-06-22: 1000 mL via INTRAVENOUS

## 2019-06-22 MED ORDER — DEXTROSE 50 % IV SOLN
12.5000 g | INTRAVENOUS | Status: AC
  Administered 2019-06-22: 12.5 g via INTRAVENOUS

## 2019-06-22 MED ORDER — DEXTROSE 50 % IV SOLN
INTRAVENOUS | Status: AC
  Filled 2019-06-22: qty 50

## 2019-06-22 MED FILL — Ondansetron HCl Inj 4 MG/2ML (2 MG/ML): INTRAMUSCULAR | Qty: 2 | Status: AC

## 2019-06-22 NOTE — Progress Notes (Signed)
Hypoglycemic Event  CBG: 68   Treatment: 51mL D50 IV   Symptoms: chills   Follow-up CBG: Time: 0446 CBG Result: 105   Possible Reasons for Event: NPO for >24 hours   Comments/MD notified: Will continue to monitor pt     Carney Corners, RN

## 2019-06-22 NOTE — Evaluation (Signed)
Occupational Therapy Evaluation Patient Details Name: Jasmine Davila MRN: [id number] DOB: [birth year] Today's Date: 06/22/2019    History of Present Illness Jasmine Davila is a 68 y.o. female who presented to ED on  06/21/2019 with  abdominal pain following ERCP and stenting performed at [facility] for recurrent pancreatitis.  CT abdomen showed acute edematous pancreatitis with incidental finding of mild uncomplicated sigmoid diverticulitis.   Clinical Impression   OT eval complete. No futher OT needed    Follow Up Recommendations  No OT follow up    Equipment Recommendations  None recommended by OT    Recommendations for Other Services       Precautions / Restrictions Precautions Precautions: None      Mobility Bed Mobility Overal bed mobility: Independent                Transfers Overall transfer level: Independent                    Balance Overall balance assessment: Independent                                         ADL either performed or assessed with clinical judgement   ADL Overall ADL's : Independent                                             Vision Patient Visual Report: No change from baseline              Pertinent Vitals/Pain Pain Assessment: No/denies pain     Hand Dominance     Extremity/Trunk Assessment Upper Extremity Assessment Upper Extremity Assessment: Generalized weakness           Communication Communication Communication: No difficulties   Cognition Arousal/Alertness: Awake/alert Behavior During Therapy: WFL for tasks assessed/performed Overall Cognitive Status: Within Functional Limits for tasks assessed                                                Home Living Family/patient expects to be discharged to:: Private residence Living Arrangements: Spouse/significant other Available Help at Discharge: Family Type of Home: House                                   Prior Functioning/Environment Level of Independence: Independent                          OT Goals(Current goals can be found in the care plan section) Acute Rehab OT Goals Patient Stated Goal: home when well  OT Frequency:                AM-PAC OT "6 Clicks" Daily Activity     Outcome Measure Help from another person eating meals?: None Help from another person taking care of personal grooming?: None Help from another person toileting, which includes using toliet, bedpan, or urinal?: None Help from another person bathing (including washing, rinsing, drying)?: None Help from another person to put on and taking off regular upper body clothing?: None Help  from another person to put on and taking off regular lower body clothing?: None 6 Click Score: 24   End of Session Nurse Communication: Mobility status  Activity Tolerance: Patient tolerated treatment well Patient left: in chair                   Time: 4081-4481 OT Time Calculation (min): 17 min Charges:  OT General Charges $OT Visit: 1 Visit OT Evaluation $OT Eval Low Complexity: 1 Low  Kari Baars, OT Acute Rehabilitation Services [phone] Office- [phone]     Sharlot Sturkey, Edwena Felty D 06/22/2019, 1:39 PM

## 2019-06-22 NOTE — Progress Notes (Signed)
[facility]                                                                              Patient Demographics  Jasmine Davila, is a 68 y.o. female, DOB - [birth year], BHA:[id number]  Admit date - 06/21/2019   Admitting Physician Rise Patience, MD  Outpatient Primary MD for the patient is [name], DO  Outpatient specialists:   LOS - 1  days   Medical records reviewed and are as summarized below:    Chief Complaint  Patient presents with   Abdominal Pain       Brief summary   68 year old female with history of recurrent pancreatitis, anemia, history of B cell lymphoma NHL in remission, diabetes mellitus type 2, IDDM, neuropathy, GERD, obesity, OSA on CPAP, B12 deficiency, thrombocytopenia, hyperlipidemia presented with abdominal pain and distention.  Patient recently had undergone ERCP and pancreatic duct stenting performed on 6/30 at [facility].  Subsequently she went home and then woke up in the morning with severe abdominal pain epigastric and left lower quadrant. CT of the abdomen pelvis showed acute edematous pancreatitis without necrosis, recently placed pancreatic stent within expected position.  Generalized dilatation of the biliary tree and mild uncomplicated sigmoid diverticulitis.   Assessment & Plan    Principal Problem:    Acute on chronic, recurrent pancreatitis, possibly idiopathic pancreatitis -Status post cholecystectomy 1997.  Underwent ERCP on 6/30 at [facility], found to have mild irritation of the main pancreatic duct consistent with type I pancreatic sphincter of 4-day and pancreatic sphincterotomy was performed, pancreatic stent was placed. -Lipase 1498 at the time of admission with transaminitis -CT abdomen pelvis confirmed pancreatitis with no necrosis or abscess, mild diverticulitis. -Spiked low-grade fever overnight, continue IV Zosyn, follow blood cultures.  COVID-19 test negative -Repeat  KUB in 1 to 2 weeks, if stent in place, may need endoscopy to remove pancreatic duct stent.  Active Problems:   Hypertension -BP currently stable  Acute mild diverticulitis -Incidentally seen on CT scan, continue IV Zosyn -Advance diet to clear liquids this morning  Left lower extremity swelling -Doppler ultrasound of the lower extremity negative for DVT    Diabetes mellitus due to underlying condition with diabetic nephropathy, with long-term current use of insulin (HCC) -Continue sliding scale insulin, hemoglobin A1c 5.8    Hyponatremia -Sodium 132 over the time of admission, resolved with IV fluid hydration     Obesity 39.8, counseled on diet and weight control  Code Status: full code  DVT Prophylaxis:  Lovenox Family Communication: Discussed in detail with the patient, all imaging results, lab results explained to the patient    Disposition Plan: Not medically ready lipase 1498, still having abdominal pain  Time Spent in minutes 35 minutes  Procedures:  CT abdomen  Consultants:   Gastroenterology  Antimicrobials:   Anti-infectives (From admission, onward)   Start     Dose/Rate Route Frequency Ordered Stop   06/21/19 1400  piperacillin-tazobactam (ZOSYN) IVPB 3.375 g     3.375 g 12.5 mL/hr over 240 Minutes Intravenous Every 8 hours 06/21/19 1108     06/21/19 0630  piperacillin-tazobactam (  ZOSYN) IVPB 3.375 g     3.375 g 100 mL/hr over 30 Minutes Intravenous  Once 06/21/19 0616 06/21/19 0655         Medications  Scheduled Meds:  aspirin EC  81 mg Oral QHS   cholecalciferol  5,000 Units Oral Daily   enoxaparin (LOVENOX) injection  50 mg Subcutaneous Q24H   famotidine  20 mg Oral BID   folic acid  0.5 mg Oral Daily   gabapentin  1,200 mg Oral TID   insulin aspart  0-9 Units Subcutaneous Q4H   insulin glargine  45 Units Subcutaneous Q2200   multivitamin with minerals  1 tablet Oral Daily   niacin  1,000 mg Oral QHS   Continuous  Infusions:  sodium chloride 125 mL/hr at 06/22/19 0956   piperacillin-tazobactam (ZOSYN)  IV 3.375 g (06/22/19 1329)   PRN Meds:.acetaminophen **OR** acetaminophen, albuterol, fluticasone, HYDROcodone-acetaminophen, morphine injection, ondansetron **OR** ondansetron (ZOFRAN) IV      Subjective:   Jasmine Davila was seen and examined today.  Not slept well last night, feels tired.  Still spiking low-grade fever 100.8 F.  Currently abdominal pain controlled with oral pain medications, 4/10, epigastric and left lower quadrant.  No nausea or vomiting.  Patient denies dizziness, chest pain, shortness of breath, new weakness, numbess, tingling.   Objective:   Vitals:   06/22/19 0409 06/22/19 0419 06/22/19 1156 06/22/19 1300  BP: 136/87  (!) 135/58   Pulse: 67  73   Resp: 16  16   Temp: 97.9 F (36.6 C)  (!) 100.8 F (38.2 C) 98.9 F (37.2 C)  TempSrc:   Oral Oral  SpO2: 100%  91%   Weight:  113.8 kg    Height:        Intake/Output Summary (Last 24 hours) at 06/22/2019 1335 Last data filed at 06/22/2019 0600 Gross per 24 hour  Intake 3614.61 ml  Output --  Net 3614.61 ml     Wt Readings from Last 3 Encounters:  06/22/19 113.8 kg  04/25/19 124.7 kg  04/11/19 106.1 kg     Exam  General: Alert and oriented x 3, NAD  Eyes: PERRLA, EOMI, Anicteric Sclera,  HEENT:    Cardiovascular: S1 S2 auscultated, Regular rate and rhythm.  Respiratory: Clear to auscultation bilaterally, no wheezing, rales or rhonchi  Gastrointestinal: Soft, mild TTP in epigastric and LLQ region, + bowel sounds  Ext: no pedal edema bilaterally  Neuro: No new deficits  Musculoskeletal: No digital cyanosis, clubbing  Skin: No rashes  Psych: Normal affect and demeanor, alert and oriented x3    Data Reviewed:  I have personally reviewed following labs and imaging studies  Micro Results Recent Results (from the past 240 hour(s))  SARS Coronavirus 2 (Hosp order,Performed in [facility]  via [facility])     Status: None   Collection Time: 06/21/19  6:15 AM   Specimen: Dry Nasal Swab ([facility] Now)  Result Value Ref Range Status   SARS Coronavirus 2 ([facility] Now) NEGATIVE NEGATIVE Final    Comment: (NOTE) Interpretive Result Comment(s): COVID 19 Positive SARS CoV 2 target nucleic acids are DETECTED. The SARS CoV 2 RNA is generally detectable in upper and lower respiratory specimens during the acute phase of infection.  Positive results are indicative of active infection with SARS CoV 2.  Clinical correlation with patient history and other diagnostic information is necessary to determine patient infection status.  Positive results do not rule out bacterial infection or coinfection with other  viruses. The expected result is Negative. COVID 19 Negative SARS CoV 2 target nucleic acids are NOT DETECTED. The SARS CoV 2 RNA is generally detectable in upper and lower respiratory specimens during the acute phase of infection.  Negative results do not preclude SARS CoV 2 infection, do not rule out coinfections with other pathogens, and should not be used as the sole basis for treatment or other patient management decisions.  Negative results must be combined with clinical  observations, patient history, and epidemiological information. The expected result is Negative. Invalid Presence or absence of SARS CoV 2 nucleic acids cannot be determined. Repeat testing was performed on the submitted specimen and repeated Invalid results were obtained.  If clinically indicated, additional testing on a new specimen with an alternate test methodology [phone]) is advised.  The SARS CoV 2 RNA is generally detectable in upper and lower respiratory specimens during the acute phase of infection. The expected result is Negative. Fact Sheet for Patients:  GolfingFamily.no Fact Sheet for Healthcare Providers: https://www.hernandez-brewer.com/ This test is  not yet approved or cleared by the [place] FDA and has been authorized for detection and/or diagnosis of SARS CoV 2 by FDA under an Emergency Use Authorization (EUA).  This EUA will remain in effect (meaning this test can be used) for the duration of the COVID19 d eclaration under Section 564(b)(1) of the Act, 21 U.S.C. section [phone] 3(b)(1), unless the authorization is terminated or revoked sooner. Performed at [facility], [address] [place], [place] [address]     Radiology Reports Ct Abdomen Pelvis W Contrast  Result Date: 06/21/2019 CLINICAL DATA:  Abdominal pain. History of ERCP and pancreatic stent placement EXAM: CT ABDOMEN AND PELVIS WITH CONTRAST TECHNIQUE: Multidetector CT imaging of the abdomen and pelvis was performed using the standard protocol following bolus administration of intravenous contrast. CONTRAST:  128mL OMNIPAQUE IOHEXOL 300 MG/ML  SOLN COMPARISON:  10/18/2018 FINDINGS: Lower chest:  Mild atelectasis. Hepatobiliary: No focal liver abnormality.Cholecystectomy with dilated biliary tree. There was ERCP with sphincterotomy performed 06/20/2019, correlating with mild pneumobilia. No obstructive process is seen. Pancreas: Generalized peripancreatic edema and pancreas expansion. There is a stent within the main pancreatic duct in expected position, with retention catheter at the level of the duodenal lumen. No necrosis or organized collection. Spleen: Unremarkable Adrenals/Urinary Tract: Negative adrenals. No hydronephrosis or stone. Unremarkable bladder. Stomach/Bowel: Colonic diverticulosis with mild fat inflammation around a thickened sigmoid diverticulum. No perforation. No appendicitis. Appendicolith is present. Mid duodenal diverticulum. Vascular/Lymphatic: No acute vascular abnormality. No mass or adenopathy. Atherosclerotic calcification of the aorta and iliacs. Reproductive:No pathologic findings. Other: No ascites or pneumoperitoneum.  Musculoskeletal: No acute abnormalities. Thoracolumbar degenerative disease greatest at L5-S1. IMPRESSION: 1. Acute edematous pancreatitis without necrosis or organized collection. 2. Recently placed pancreatic stent is in expected position. 3. Generalized dilatation of the biliary tree which was recently evaluated by ERCP. 4. Mild uncomplicated sigmoid diverticulitis incidentally noted. Electronically Signed   By: Monte Fantasia M.D.   On: 06/21/2019 06:04   Dg Bone Density  Result Date: 06/15/2019 EXAM: DUAL X-RAY ABSORPTIOMETRY (DXA) FOR BONE MINERAL DENSITY IMPRESSION: Referring Physician:  [name] Your patient completed a BMD test using Lunar IDXA DXA system ( analysis version: 16 ) manufactured by [employer]. Technologist: AW PATIENT: Name: Jasmine, Davila Patient ID: [id number] Birth Date: 02/17/1951 Height:     66.3 in. Sex: Female Measured: 06/14/2019 Weight:     235.8 lbs. Indications: Caucasian, Estrogen Deficient, Gabapentin, Pepcid, Postmenopausal Fractures:  None Treatments: Calcium (E943.0), Vitamin D (E933.5) ASSESSMENT: The BMD measured at Femur Neck Right is 1.040 g/cm2 with a T-score of 0.0. This patient is considered NORMAL according to [facility]) criteria. The scan quality is good. L-3  was excluded due to degenerative changes. Site Region Measured Date Measured Age [age]A T-score BMD Significant CHANGE DualFemur Neck Right 06[age]14/[age]    68.4         0.0     1.040 g/cm2 AP Spine  L1-L4 (L3) 5/14/2019    67.4         1.1     1.320 g/cm2 DualFemur Total Mean 5/14/2019    68.4         [age]     1.171 g/cm2 World Health Organization [facility]) criteria for post-menopausal, Caucasian Women: Normal       T-score at or above -1 SD Osteopenia   T-score between -1 and -2.5 SD Osteoporosis T-score at or below -2.5 SD RECOMMENDATION: 1. All patients should optimize calcium and vitamin D intake. 2. Consider FDA approved medical therapies in postmenopausal women and men aged 63  years and older, based on the following: a. A hip or vertebral (clinical or morphometric) fracture b. T- score < or = -2.5 at the femoral neck or spine after appropriate evaluation to exclude secondary causes c. Low bone mass (T-score between -1.0 and -2.5 at the femoral neck or spine) and a 10 year probability of a hip fracture > or = 3% or a 10 year probability of a major osteoporosis-related fracture > or = 20% based on the [place]-adapted WHO algorithm d. Clinician judgment and/or patient preferences may indicate treatment for people with 10-year fracture probabilities above or below these levels FOLLOW-UP: Patients with diagnosis of osteoporosis or at high risk for fracture should have regular bone mineral density tests. For patients eligible for Medicare, routine testing is allowed once every 2 years. The testing frequency can be increased to one year for patients who have rapidly progressing disease, those who are receiving or discontinuing medical therapy to restore bone mass, or have additional risk factors. I have reviewed this report and agree with the above findings. Mark A. Thornton Papas, M.D. [facility] Radiology Electronically Signed   By: Lavonia Dana M.D.   On: 06/15/2019 09:12   Vas [place] Lower Extremity Venous (dvt)  Result Date: 06/21/2019  Lower Venous Study Indications: Edema.  Risk Factors: Cancer B-cell lymphoma non Hodgkin"s type in emission recurrent pancretitis,Anemia,DM2, HLD,HTN. Limitations: Body habitus. Performing Technologist: Toma Copier RVS  Examination Guidelines: A complete evaluation includes B-mode imaging, spectral Doppler, color Doppler, and power Doppler as needed of all accessible portions of each vessel. Bilateral testing is considered an integral part of a complete examination. Limited examinations for reoccurring indications may be performed as noted.  +-----+---------------+---------+-----------+----------+-------+   RIGHT Compressibility Phasicity Spontaneity Properties Summary  +-----+---------------+---------+-----------+----------+-------+  CFV   Full            Yes       Yes                             +-----+---------------+---------+-----------+----------+-------+  SFJ   Full                                                      +-----+---------------+---------+-----------+----------+-------+   +---------+---------------+---------+-----------+----------+-------------------+  LEFT      Compressibility Phasicity Spontaneity Properties Summary              +---------+---------------+---------+-----------+----------+-------------------+  CFV       Full            Yes       Yes                                         +---------+---------------+---------+-----------+----------+-------------------+  SFJ       Full                                                                  +---------+---------------+---------+-----------+----------+-------------------+  FV Prox   Full            Yes       Yes                                         +---------+---------------+---------+-----------+----------+-------------------+  FV Mid    Full                                                                  +---------+---------------+---------+-----------+----------+-------------------+  FV Distal Full            Yes       Yes                                         +---------+---------------+---------+-----------+----------+-------------------+  PFV       Full            Yes       Yes                                         +---------+---------------+---------+-----------+----------+-------------------+  POP       Full            Yes       Yes                                         +---------+---------------+---------+-----------+----------+-------------------+  PTV       Full                                                                  +---------+---------------+---------+-----------+----------+-------------------+  PERO  Difficult to image                                                               due to body habitus  +---------+---------------+---------+-----------+----------+-------------------+     Summary: Right: There is no evidence of a common femoral vein obstruction. Left: There is no evidence of deep vein thrombosis in the lower extremity. However, portions of this examination were limited- see technologist comments above. No cystic structure found in the popliteal fossa.  *See table(s) above for measurements and observations.    Preliminary     Lab Data:  CBC: Recent Labs  Lab 06/21/19 0421 06/21/19 1207 06/22/19 0530  WBC 9.6 4.8 6.0  NEUTROABS 8.2*  --   --   HGB 12.7 11.1* 11.2*  HCT 38.0 33.3* 36.7  MCV 87.4 89.8 94.1  PLT 136* 117* 341*   Basic Metabolic Panel: Recent Labs  Lab 06/21/19 0421 06/21/19 1207 06/22/19 0530  NA 132*  --  139  K 3.9  --  3.7  CL 96*  --  108  CO2 26  --  22  GLUCOSE 182*  --  115*  BUN 12  --  7*  CREATININE 0.71 0.57 0.59  CALCIUM 9.0  --  8.1*  MG  --   --  1.8  PHOS  --   --  2.1*   GFR: Estimated Creatinine Clearance: 87.7 mL/min (by C-G formula based on SCr of 0.59 mg/dL). Liver Function Tests: Recent Labs  Lab 06/21/19 0421 06/22/19 0530  AST 210* 128*  ALT 104* 186*  ALKPHOS 154* 186*  BILITOT 2.3* 0.8  PROT 7.1 6.0*  ALBUMIN 3.8 3.2*   Recent Labs  Lab 06/21/19 0421 06/22/19 0530  LIPASE 1,498* 1,101*   No results for input(s): AMMONIA in the last 168 hours. Coagulation Profile: No results for input(s): INR, PROTIME in the last 168 hours. Cardiac Enzymes: No results for input(s): CKTOTAL, CKMB, CKMBINDEX, TROPONINI in the last 168 hours. BNP (last 3 results) No results for input(s): PROBNP in the last 8760 hours. HbA1C: Recent Labs    06/22/19 0530  HGBA1C 5.8*   CBG: Recent Labs  Lab 06/22/19 0045 06/22/19 0406 06/22/19 0446 06/22/19 0735 06/22/19 1143  GLUCAP 88  68* 105* 93 100*   Lipid Profile: Recent Labs    06/22/19 0530  CHOL 113  HDL 38*  LDLCALC 60  TRIG 75  CHOLHDL 3.0   Thyroid Function Tests: Recent Labs    06/22/19 0530  TSH 1.152   Anemia Panel: No results for input(s): VITAMINB12, FOLATE, FERRITIN, TIBC, IRON, RETICCTPCT in the last 72 hours. Urine analysis:    Component Value Date/Time   COLORURINE YELLOW 10/19/2018 0605   APPEARANCEUR CLEAR 10/19/2018 0605   LABSPEC 1.033 (H) 10/19/2018 0605   PHURINE 6.0 10/19/2018 0605   GLUCOSEU NEGATIVE 10/19/2018 0605   HGBUR SMALL (A) 10/19/2018 0605   BILIRUBINUR NEGATIVE 10/19/2018 0605   BILIRUBINUR negative 06/08/2018 [place] 10/19/2018 0605   PROTEINUR NEGATIVE 10/19/2018 0605   UROBILINOGEN 0.2 06/08/2018 1116   NITRITE NEGATIVE 10/19/2018 0605   LEUKOCYTESUR NEGATIVE 10/19/2018 9622     Willem Klingensmith M.D. [facility] 06/22/2019, 1:35 PM  Pager: [phone] Between 7am to 7pm - call Pager - [phone]  After  7pm go to [email or web address] - password [email or web address]  Call night coverage person covering after 7pm

## 2019-06-22 NOTE — Progress Notes (Signed)
[facility] Gastroenterology Progress Note  Jasmine Davila 68 y.o. 06-Sep-1951  CC: Post ERCP pancreatitis, diverticulitis   Subjective: -Spiked low-grade fever today at 100.8.  Abdominal pain has improved significantly.  Denies nausea vomiting.  No bowel movement since admission.  ROS : Negative for acute chest pain and shortness of breath.   Objective: Vital signs in last 24 hours: Vitals:   06/22/19 0409 06/22/19 1156  BP: 136/87 (!) 135/58  Pulse: 67 73  Resp: 16 16  Temp: 97.9 F (36.6 C) (!) 100.8 F (38.2 C)  SpO2: 100% 91%    Physical Exam:  General:  Alert, cooperative, no distress, appears stated age  Head:  Normocephalic, without obvious abnormality, atraumatic  Eyes:  , EOM's intact,   Lungs:   Clear to auscultation bilaterally, respirations unlabored  Heart:  Regular rate and rhythm, S1, S2 normal  Abdomen:   Soft, non-tender, nondistended, bowel sounds present, no peritoneal signs  Extremities:  edema noted       Lab Results: Recent Labs    06/21/19 0421 06/21/19 1207 06/22/19 0530  NA 132*  --  139  K 3.9  --  3.7  CL 96*  --  108  CO2 26  --  22  GLUCOSE 182*  --  115*  BUN 12  --  7*  CREATININE 0.71 0.57 0.59  CALCIUM 9.0  --  8.1*  MG  --   --  1.8  PHOS  --   --  2.1*   Recent Labs    06/21/19 0421 06/22/19 0530  AST 210* 128*  ALT 104* 186*  ALKPHOS 154* 186*  BILITOT 2.3* 0.8  PROT 7.1 6.0*  ALBUMIN 3.8 3.2*   Recent Labs    06/21/19 0421 06/21/19 1207 06/22/19 0530  WBC 9.6 4.8 6.0  NEUTROABS 8.2*  --   --   HGB 12.7 11.1* 11.2*  HCT 38.0 33.3* 36.7  MCV 87.4 89.8 94.1  PLT 136* 117* 115*   No results for input(s): LABPROT, INR in the last 72 hours.    Assessment/Plan: - Post ERCP pancreatitis. -History of chronic pancreatitis.  Status post ERCP with pancreatic sphincterotomy and pancreatic stent placement on 06/30 at [facility]. -Incidental finding of uncomplicated sigmoid diverticulitis. -Abnormal LFTs.     Improving  Recommendations ------------------------- -Spike low-grade fever but her abdominal pain has improved significantly.  Lipase and BUN trending down.  LFTs improving. -Once diet to full liquid -Continue antibiotics for diverticulitis. -Repeat KUB in 1 to 2 weeks.  If stent still in place, may need EGD to remove pancreatic duct stent. -GI will follow   Otis Brace MD, [place] 06/22/2019, 12:59 PM  Contact #  [phone]

## 2019-06-22 NOTE — Progress Notes (Signed)
PT Cancellation Note  Patient Details Name: Jasmine Davila MRN: [id number] DOB: Feb 18, 1951   Cancelled Treatment:    Reason Eval/Treat Not Completed: PT screened, no needs identified, will sign off   [facility] 06/22/2019, 3:20 PM

## 2019-06-23 LAB — LIPASE, BLOOD: Lipase: 353 U/L — ABNORMAL HIGH (ref 11–51)

## 2019-06-23 LAB — COMPREHENSIVE METABOLIC PANEL
ALT: 113 U/L — ABNORMAL HIGH (ref 0–44)
AST: 58 U/L — ABNORMAL HIGH (ref 15–41)
Albumin: 3.2 g/dL — ABNORMAL LOW (ref 3.5–5.0)
Alkaline Phosphatase: 149 U/L — ABNORMAL HIGH (ref 38–126)
Anion gap: 8 (ref 5–15)
BUN: 5 mg/dL — ABNORMAL LOW (ref 8–23)
CO2: 23 mmol/L (ref 22–32)
Calcium: 8.4 mg/dL — ABNORMAL LOW (ref 8.9–10.3)
Chloride: 107 mmol/L (ref 98–111)
Creatinine, Ser: 0.58 mg/dL (ref 0.44–1.00)
GFR calc Af Amer: >60 mL/min (ref >60)
GFR calc non Af Amer: >60 mL/min (ref >60)
Glucose, Bld: 118 mg/dL — ABNORMAL HIGH (ref 70–99)
Potassium: 3.5 mmol/L (ref 3.5–5.1)
Sodium: 138 mmol/L (ref 135–145)
Total Bilirubin: 0.6 mg/dL (ref 0.3–1.2)
Total Protein: 5.9 g/dL — ABNORMAL LOW (ref 6.5–8.1)

## 2019-06-23 LAB — GLUCOSE, CAPILLARY
Glucose-Capillary: 109 mg/dL — ABNORMAL HIGH (ref 70–99)
Glucose-Capillary: 114 mg/dL — ABNORMAL HIGH (ref 70–99)
Glucose-Capillary: 114 mg/dL — ABNORMAL HIGH (ref 70–99)

## 2019-06-23 MED ORDER — ONDANSETRON 4 MG PO TBDP: 4.0000 mg | ORAL_TABLET | Freq: Three times a day (TID) | ORAL | 0 refills | Status: DC | PRN

## 2019-06-23 MED ORDER — AMOXICILLIN-POT CLAVULANATE 875-125 MG PO TABS: 1.0000 | ORAL_TABLET | Freq: Two times a day (BID) | ORAL | 0 refills | Status: DC

## 2019-06-23 MED ORDER — LOSARTAN POTASSIUM 25 MG PO TABS
25.0000 mg | ORAL_TABLET | Freq: Every day | ORAL | Status: DC
  Administered 2019-06-23: 25 mg via ORAL
  Filled 2019-06-23: qty 1

## 2019-06-23 NOTE — Progress Notes (Signed)
Pharmacy Antibiotic Note  Jasmine Davila is a 68 y.o. female who presented to ED on  06/21/2019 with  abdominal pain following ERCP and stenting performed at [facility] for recurrent pancreatitis.  CT abdomen showed acute edematous pancreatitis with incidental finding of mild uncomplicated sigmoid diverticulitis.  GI has been consulted for diverticulitis. Renal function remains stable and cultures has been neg so far. She has become AF and WBC wnl.   Plan: Zosyn 3.375 grams IV q8h (4-hour infusion) Rx will sign off  Height: 5\' 7"  (170.2 cm) Weight: 251 lb 6.4 oz (114 kg) IBW/kg (Calculated) : 61.6  Temp (24hrs), Avg:99.4 F (37.4 C), Min:98.4 F (36.9 C), Max:100.8 F (38.2 C)  Recent Labs  Lab 06/21/19 0421 06/21/19 1207 06/22/19 0530 06/23/19 0450  WBC 9.6 4.8 6.0  --   CREATININE 0.71 0.57 0.59 0.58    Estimated Creatinine Clearance: 87.8 mL/min (by C-G formula based on SCr of 0.58 mg/dL).    Allergies  Allergen Reactions  . Dilaudid [Hydromorphone Hcl] Other (See Comments)    anxious  . Doxycycline Other (See Comments)    Pancreatic issues  . Metronidazole Other (See Comments)    Cream  Burned face  . Pseudoephedrine Hcl Other (See Comments)    Pt unsure    Antimicrobials this admission: 7/1 Zosyn >>    Microbiology results: 7/1 SARSCoV2: neg 7/2 blood>>  Onnie Boer, PharmD, BCIDP, AAHIVP, CPP Infectious Disease Pharmacist 06/23/2019 10:00 AM

## 2019-06-23 NOTE — Progress Notes (Signed)
[facility] Gastroenterology Progress Note  Jasmine Davila 68 y.o. 1951-03-08  CC: Post ERCP pancreatitis, diverticulitis   Subjective: -Feeling better.  Abdominal pain has improved significantly.  Tolerating soft diet.  Sitting comfortably in the recliner.  Afebrile this morning  ROS : Negative for acute chest pain and shortness of breath.   Objective: Vital signs in last 24 hours: Vitals:   06/22/19 2011 06/23/19 0357  BP: 137/72 (!) 141/49  Pulse: 67 68  Resp: 18 16  Temp: 99.3 F (37.4 C) 98.4 F (36.9 C)  SpO2: 96% 93%    Physical Exam:  General:  Alert, cooperative, no distress, appears stated age  Head:  Normocephalic, without obvious abnormality, atraumatic  Eyes:  , EOM's intact,   Lungs:   Clear to auscultation bilaterally, respirations unlabored  Heart:  Regular rate and rhythm, S1, S2 normal  Abdomen:   Soft, non-tender, nondistended, bowel sounds present, no peritoneal signs  Extremities:  edema noted       Lab Results: Recent Labs    06/22/19 0530 06/23/19 0450  NA 139 138  K 3.7 3.5  CL 108 107  CO2 22 23  GLUCOSE 115* 118*  BUN 7* 5*  CREATININE 0.59 0.58  CALCIUM 8.1* 8.4*  MG 1.8  --   PHOS 2.1*  --    Recent Labs    06/22/19 0530 06/23/19 0450  AST 128* 58*  ALT 186* 113*  ALKPHOS 186* 149*  BILITOT 0.8 0.6  PROT 6.0* 5.9*  ALBUMIN 3.2* 3.2*   Recent Labs    06/21/19 0421 06/21/19 1207 06/22/19 0530  WBC 9.6 4.8 6.0  NEUTROABS 8.2*  --   --   HGB 12.7 11.1* 11.2*  HCT 38.0 33.3* 36.7  MCV 87.4 89.8 94.1  PLT 136* 117* 115*   No results for input(s): LABPROT, INR in the last 72 hours.    Assessment/Plan: - Post ERCP pancreatitis. -History of chronic pancreatitis.  Status post ERCP with pancreatic sphincterotomy and pancreatic stent placement on 06/30 at [facility]. -Incidental finding of uncomplicated sigmoid diverticulitis. -Abnormal LFTs.    Improving - family  history of colon  cancer.  Recommendations ------------------------- -Feeling better.  LFTs improving. -Tolerating soft diet. -Okay to discharge home on Augmentin for 10 days for diverticulitis.  She is allergic to metronidazole. -Repeat KUB in 1  week.  If stent still in place, may need EGD to remove pancreatic duct stent.  She was advised to call us back in 1 week to arrange for outpatient abdominal x-ray. -GI will sign off.  Follow-up in GI clinic in 2 to 4 weeks.  She is also due for outpatient colonoscopy.   Otis Brace MD, [place] 06/23/2019, 10:40 AM  Contact #  [phone]

## 2019-06-23 NOTE — Progress Notes (Signed)
RN went to check on pt and pt expressed to the RN how her ankles were swollen and very sore. Upon assessment, pt ankles were swollen with +1 edema. Lungs were clear upon auscultation. No other areas appeared swollen.This RN turned fluids off and notified on call [facility] NP. Awaiting any orders. This RN will continue to monitor the pt closely.

## 2019-06-23 NOTE — Discharge Summary (Addendum)
Physician Discharge Summary   Patient ID: Jasmine Davila MRN: [id number] DOB/AGE: 02/09/1951 68 y.o.  Admit date: 06/21/2019 Discharge date: 06/23/2019  Primary Care Physician:  [name], DO   Recommendations for Outpatient Follow-up:  1. Follow up with PCP and gastroenterology in 1-2 weeks 2. Per gastroenterology, repeat KUB in 1 to 2 weeks, if stent in place, may need endoscopy to remove pancreatic duct stent.  Home Health: None Equipment/Devices:   Discharge Condition: stable  CODE STATUS: FULL  Diet recommendation: Soft diet   Discharge Diagnoses:    . Acute pancreatitis post ERCP . Hyperlipidemia associated with type 2 diabetes mellitus ([place]) . Hypertension associated with diabetes ([place]) . 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 Dilaudid [Hydromorphone Hcl] Other (See Comments)    anxious  . Doxycycline Other (See Comments)    Pancreatic issues  . Metronidazole Other (See Comments)    Cream  Burned face  . Pseudoephedrine Hcl Other (See Comments)    Pt unsure     DISCHARGE MEDICATIONS: Allergies as of 06/23/2019      Reactions   Dilaudid [hydromorphone Hcl] Other (See Comments)   anxious   Doxycycline Other (See Comments)   Pancreatic issues   Metronidazole Other (See Comments)   Cream  Burned face   Pseudoephedrine Hcl Other (See Comments)   Pt unsure      Medication List    TAKE these medications   amoxicillin-clavulanate 875-125 MG tablet Commonly known as: Augmentin Take 1 tablet by mouth 2 (two) times daily for 10 days.   aspirin EC 81 MG tablet Take 81 mg by mouth at bedtime.   cyanocobalamin 1000 MCG/ML injection Commonly known as: (VITAMIN B-12) Inject 0.5 mLs (500 mcg total) into the muscle every 30 (thirty) days.   famotidine 20 MG tablet Commonly known as: PEPCID TAKE 1  TABLET BY MOUTH 2 TIMES DAILY.   fluticasone 50 MCG/ACT nasal spray Commonly known as: FLONASE PLACE 1 SPRAY INTO BOTH NOSTRILS DAILY AS NEEDED FOR ALLERGIES OR RHINITIS.   folic acid 154 MCG tablet Commonly known as: FOLVITE Take 400 mcg by mouth daily.   gabapentin 300 MG capsule Commonly known as: NEURONTIN TAKE 4 CAPSULES BY MOUTH 3 TIMES DAILY. What changed: See the new instructions.   HumaLOG KwikPen 100 UNIT/ML KwikPen Generic drug: insulin lispro INJECT 20 UNITS INTO THE SKIN DAILY WITH LARGEST MEAL What changed: See the new instructions.   HYDROcodone-acetaminophen 5-325 MG tablet Commonly known as: NORCO/VICODIN Take 1 tablet by mouth every 6 (six) hours as needed for moderate pain.   Lantus SoloStar 100 UNIT/ML Solostar Pen Generic drug: Insulin Glargine INJECT 60 UNITS INTO THE SKIN EVERY MORNING. What changed: See the new instructions.   losartan 25 MG tablet Commonly known as: COZAAR TAKE 1 TABLET BY MOUTH DAILY.   multivitamin tablet Take 1 tablet by mouth daily.   niacin 1000 MG CR tablet Commonly known as: NIASPAN Take 1 tablet (1,000 mg total) by mouth at bedtime. An hour after 81 mg ASA   ondansetron 4 MG disintegrating tablet Commonly known as: Zofran ODT Take 1 tablet (4 mg total) by mouth every 8 (eight) hours as needed for nausea or vomiting.   ONE TOUCH ULTRA TEST test strip Generic drug: glucose blood USE 1 STRIP TO CHECK GLUCOSE THREE TIMES DAILY AS NEEDED   Vitamin D-3 125  MCG (5000 UT) Tabs Take 1 tablet by mouth daily.        Brief H and P: For complete details please refer to admission H and P, but in brief 68 year old female with history of recurrent pancreatitis, anemia, history of B cell lymphoma NHL in remission, diabetes mellitus type 2, IDDM, neuropathy, GERD, obesity, OSA on CPAP, B12 deficiency, thrombocytopenia, hyperlipidemia presented with abdominal pain and distention.  Patient recently had undergone ERCP and pancreatic  duct stenting performed on 6/30 at [facility].  Subsequently she went home and then woke up in the morning with severe abdominal pain epigastric and left lower quadrant. CT of the abdomen pelvis showed acute edematous pancreatitis without necrosis, recently placed pancreatic stent within expected position.  Generalized dilatation of the biliary tree and mild uncomplicated sigmoid diverticulitis.   Hospital Course:   Acute on chronic, recurrent pancreatitis, possibly idiopathic pancreatitis -Status post cholecystectomy 1997.  Underwent ERCP on 6/30 at [facility], found to have mild irritation of the main pancreatic duct consistent with type I pancreatic sphincter of 4-day and pancreatic sphincterotomy was performed, pancreatic stent was placed. -Lipase 1498 at the time of admission with transaminitis to 353 at the time of discharge -CT abdomen pelvis confirmed pancreatitis with no necrosis or abscess, mild diverticulitis. -Patient had spiked a low-grade fever and on admission, was placed on IV Zosyn to also cover for mild acute diverticulitis. -GI was consulted, patient was placed on initially n.p.o. status and IV fluids, pain control. -She is now tolerating solid diet, wants to go home. -Per gastroenterology, repeat KUB in 1 to 2 weeks, if stent in place, may need endoscopy to remove pancreatic duct stent.  Patient will follow with [facility]    Hypertension -BP currently stable, continue losartan  Acute mild diverticulitis -Incidentally seen on CT scan, patient was initially placed on IV Zosyn -Transition to oral Augmentin at the time of discharge for 10days  Left lower extremity swelling -Likely due to aggressive IV fluid hydration, improving.  Fluids were discontinued. -Doppler ultrasound of the lower extremity negative for DVT    Diabetes mellitus due to underlying condition with diabetic nephropathy, with long-term current use of insulin (HCC) -Continue  sliding scale insulin, hemoglobin A1c 5.8    Hyponatremia -Sodium 132 over the time of admission, resolved with IV fluid hydration -Sodium 138 at the time of discharge.     Obesity 39.8, counseled on diet and weight control  Day of Discharge S: No complaints, no fevers or chills, wants to go home.  States it is her husband's birthday today.  Diet advanced to solids.  BP (!) 141/49 (BP Location: Right Arm)   Pulse 68   Temp 98.4 F (36.9 C) (Oral)   Resp 16   Ht 5\' 7"  (1.702 m)   Wt 114 kg   LMP 12/21/1998 (Approximate)   SpO2 93%   BMI 39.37 kg/m   Physical Exam: General: Alert and awake oriented x3 not in any acute distress. HEENT: anicteric sclera, pupils reactive to light and accommodation CVS: S1-S2 clear no murmur rubs or gallops Chest: clear to auscultation bilaterally, no wheezing rales or rhonchi Abdomen: soft nontender, nondistended, normal bowel sounds Extremities: no cyanosis, clubbing or edema noted bilaterally Neuro: Cranial nerves II-XII intact, no focal neurological deficits   The results of significant diagnostics from this hospitalization (including imaging, microbiology, ancillary and laboratory) are listed below for reference.      Procedures/Studies:  Ct Abdomen Pelvis W Contrast  Result  Date: 06/21/2019 CLINICAL DATA:  Abdominal pain. History of ERCP and pancreatic stent placement EXAM: CT ABDOMEN AND PELVIS WITH CONTRAST TECHNIQUE: Multidetector CT imaging of the abdomen and pelvis was performed using the standard protocol following bolus administration of intravenous contrast. CONTRAST:  166mL OMNIPAQUE IOHEXOL 300 MG/ML  SOLN COMPARISON:  10/18/2018 FINDINGS: Lower chest:  Mild atelectasis. Hepatobiliary: No focal liver abnormality.Cholecystectomy with dilated biliary tree. There was ERCP with sphincterotomy performed 06/20/2019, correlating with mild pneumobilia. No obstructive process is seen. Pancreas: Generalized peripancreatic edema and  pancreas expansion. There is a stent within the main pancreatic duct in expected position, with retention catheter at the level of the duodenal lumen. No necrosis or organized collection. Spleen: Unremarkable Adrenals/Urinary Tract: Negative adrenals. No hydronephrosis or stone. Unremarkable bladder. Stomach/Bowel: Colonic diverticulosis with mild fat inflammation around a thickened sigmoid diverticulum. No perforation. No appendicitis. Appendicolith is present. Mid duodenal diverticulum. Vascular/Lymphatic: No acute vascular abnormality. No mass or adenopathy. Atherosclerotic calcification of the aorta and iliacs. Reproductive:No pathologic findings. Other: No ascites or pneumoperitoneum. Musculoskeletal: No acute abnormalities. Thoracolumbar degenerative disease greatest at L5-S1. IMPRESSION: 1. Acute edematous pancreatitis without necrosis or organized collection. 2. Recently placed pancreatic stent is in expected position. 3. Generalized dilatation of the biliary tree which was recently evaluated by ERCP. 4. Mild uncomplicated sigmoid diverticulitis incidentally noted. Electronically Signed   By: Monte Fantasia M.D.   On: 06/21/2019 06:04   Dg Bone Density  Result Date: 06/15/2019 EXAM: DUAL X-RAY ABSORPTIOMETRY (DXA) FOR BONE MINERAL DENSITY IMPRESSION: Referring Physician:  [name] Your patient completed a BMD test using Lunar IDXA DXA system ( analysis version: 16 ) manufactured by [employer]. Technologist: AW PATIENT: Name: Jennette, Leask Patient ID: [id number] Birth Date: 17-Jun-1951 Height:     66.3 in. Sex: Female Measured: 06/14/2019 Weight:     235.8 lbs. Indications: Caucasian, Estrogen Deficient, Gabapentin, Pepcid, Postmenopausal Fractures: None Treatments: Calcium (E943.0), Vitamin D (E933.5) ASSESSMENT: The BMD measured at Femur Neck Right is 1.040 g/cm2 with a T-score of 0.0. This patient is considered NORMAL according to [facility]) criteria. The scan  quality is good. L-3  was excluded due to degenerative changes. Site Region Measured Date Measured Age [age]A T-score BMD Significant CHANGE DualFemur Neck Right 06[age]14/2019    68.4         0.0     1.040 g/cm2 AP Spine  L1-L4 (L3) 5/14/2019    68.4         1.2     1.320 g/cm2 DualFemur Total Mean 5/14/2019    68.4         [age]     1.171 g/cm2 World Health Organization [facility]) criteria for post-menopausal, Caucasian Women: Normal       T-score at or above -1 SD Osteopenia   T-score between -1 and -2.5 SD Osteoporosis T-score at or below -2.5 SD RECOMMENDATION: 1. All patients should optimize calcium and vitamin D intake. 2. Consider FDA approved medical therapies in postmenopausal women and men aged 44 years and older, based on the following: a. A hip or vertebral (clinical or morphometric) fracture b. T- score < or = -2.5 at the femoral neck or spine after appropriate evaluation to exclude secondary causes c. Low bone mass (T-score between -1.0 and -2.5 at the femoral neck or spine) and a 10 year probability of a hip fracture > or = 3% or a 10 year probability of a major osteoporosis-related fracture > or = 20% based on the [place]-adapted [facility]  algorithm d. Clinician judgment and/or patient preferences may indicate treatment for people with 10-year fracture probabilities above or below these levels FOLLOW-UP: Patients with diagnosis of osteoporosis or at high risk for fracture should have regular bone mineral density tests. For patients eligible for Medicare, routine testing is allowed once every 2 years. The testing frequency can be increased to one year for patients who have rapidly progressing disease, those who are receiving or discontinuing medical therapy to restore bone mass, or have additional risk factors. I have reviewed this report and agree with the above findings. Mark A. Thornton Papas, M.D. [facility] Electronically Signed   By: Lavonia Dana M.D.   On: 06/15/2019 09:12   Vas [place] Lower Extremity Venous  (dvt)  Result Date: 06/22/2019  Lower Venous Study Indications: Edema.  Risk Factors: Cancer B-cell lymphoma non Hodgkin"s type in emission recurrent pancretitis,Anemia,DM2, HLD,HTN. Limitations: Body habitus. Performing Technologist: Toma Copier RVS  Examination Guidelines: A complete evaluation includes B-mode imaging, spectral Doppler, color Doppler, and power Doppler as needed of all accessible portions of each vessel. Bilateral testing is considered an integral part of a complete examination. Limited examinations for reoccurring indications may be performed as noted.  +-----+---------------+---------+-----------+----------+-------+ RIGHTCompressibilityPhasicitySpontaneityPropertiesSummary +-----+---------------+---------+-----------+----------+-------+ CFV  Full           Yes      Yes                          +-----+---------------+---------+-----------+----------+-------+ SFJ  Full                                                 +-----+---------------+---------+-----------+----------+-------+   +---------+---------------+---------+-----------+----------+-------------------+ LEFT     CompressibilityPhasicitySpontaneityPropertiesSummary             +---------+---------------+---------+-----------+----------+-------------------+ CFV      Full           Yes      Yes                                      +---------+---------------+---------+-----------+----------+-------------------+ SFJ      Full                                                             +---------+---------------+---------+-----------+----------+-------------------+ FV Prox  Full           Yes      Yes                                      +---------+---------------+---------+-----------+----------+-------------------+ FV Mid   Full                                                             +---------+---------------+---------+-----------+----------+-------------------+ FV  DistalFull           Yes  Yes                                      +---------+---------------+---------+-----------+----------+-------------------+ PFV      Full           Yes      Yes                                      +---------+---------------+---------+-----------+----------+-------------------+ POP      Full           Yes      Yes                                      +---------+---------------+---------+-----------+----------+-------------------+ PTV      Full                                                             +---------+---------------+---------+-----------+----------+-------------------+ PERO                                                  Difficult to image                                                        due to body habitus +---------+---------------+---------+-----------+----------+-------------------+     Summary: Right: There is no evidence of a common femoral vein obstruction. Left: There is no evidence of deep vein thrombosis in the lower extremity. However, portions of this examination were limited- see technologist comments above. No cystic structure found in the popliteal fossa.  *See table(s) above for measurements and observations. Electronically signed by Monica Martinez MD on 06/22/2019 at 2:17:38 PM.    Final       LAB RESULTS: Basic Metabolic Panel: Recent Labs  Lab 06/22/19 0530 06/23/19 0450  NA 139 138  K 3.7 3.5  CL 108 107  CO2 22 23  GLUCOSE 115* 118*  BUN 7* 5*  CREATININE 0.59 0.58  CALCIUM 8.1* 8.4*  MG 1.8  --   PHOS 2.1*  --    Liver Function Tests: Recent Labs  Lab 06/22/19 0530 06/23/19 0450  AST 128* 58*  ALT 186* 113*  ALKPHOS 186* 149*  BILITOT 0.8 0.6  PROT 6.0* 5.9*  ALBUMIN 3.2* 3.2*   Recent Labs  Lab 06/22/19 0530 06/23/19 0450  LIPASE 1,101* 353*   No results for input(s): AMMONIA in the last 168 hours. CBC: Recent Labs  Lab 06/21/19 0421 06/21/19 1207  06/22/19 0530  WBC 9.6 4.8 6.0  NEUTROABS 8.2*  --   --   HGB 12.7 11.1* 11.2*  HCT 38.0 33.3* 36.7  MCV 87.4 89.8 94.1  PLT 136* 117* 115*   Cardiac Enzymes: No results for input(s): CKTOTAL, CKMB, CKMBINDEX, TROPONINI in  the last 168 hours. BNP: Invalid input(s): POCBNP CBG: Recent Labs  Lab 06/23/19 0353 06/23/19 0754  GLUCAP 114* 109*      Disposition and Follow-up:    DISPOSITION: Home   DISCHARGE FOLLOW-UP Follow-up Information    Brahmbhatt, Parag, MD. Schedule an appointment as soon as possible for a visit in 2 week(s).   Specialty: Gastroenterology Contact information: [address] [place] [address] [phone]        [name], DO. Schedule an appointment as soon as possible for a visit in 2 week(s).   Specialty: Family Medicine Contact information: [place] [address] [phone]            Time coordinating discharge:  35 minutes  Signed:   Estill Cotta M.D. [facility] 06/23/2019, 11:21 AM

## 2019-06-23 NOTE — Care Management Important Message (Signed)
Important Message  Patient Details  Name: Jasmine Davila MRN: [id number] Date of Birth: [birth year]   Medicare Important Message Given:  Yes. CMA printed out IM for the CSW or Case Manager Nurse to give to the patient.      Aarion Kittrell 06/23/2019, 8:15 AM

## 2019-06-26 ENCOUNTER — Other Ambulatory Visit: Payer: Self-pay

## 2019-06-26 NOTE — Patient Outreach (Signed)
[facility]) Care Management  06/26/2019  Margeaux Swantek [birth year] [id number]    EMMI-General Discharge RED ON EMMI ALERT Day # 1 Date: 06/25/2019 Red Alert Reason: " Scheduled follow up? No"   Outreach attempt # 1 to patient. Spoke with patient who is pleased to report that she is "getting better everyday." She denies any acute issues or concerns at this time. Reviewed and addressed red alert with patient. She was discharged home on Friday evening and it was a holiday weekend. Patient voices she was unable to call MD offices to make appts. She plans to do so this morning and has d/c paperwork and knows which MD to contact and follow up with. She denies any issues with transportation. RN CM confirmed with patient that she has all her meds in the home and no issues or concerns regarding them. She denies any further RN CM needs or concerns at this time. Advised patient that they would get one more automated EMMI-GENERAL post discharge calls to assess how they are doing following recent hospitalization and will receive a call from a nurse if any of their responses were abnormal. Patient voiced understanding and was appreciative of f/u call.     Plan: RN CM will close case as no further interventions needed at this time.   Enzo Montgomery, RN,BSN,CCM [place] Management Telephonic Care Management Coordinator Direct Phone: [phone] Toll Free: [phone] Fax: [phone]

## 2019-06-27 LAB — CULTURE, BLOOD (ROUTINE X 2)
Culture: NO GROWTH
Culture: NO GROWTH
Special Requests: ADEQUATE
Special Requests: ADEQUATE

## 2019-07-03 ENCOUNTER — Ambulatory Visit (INDEPENDENT_AMBULATORY_CARE_PROVIDER_SITE_OTHER): Payer: Medicare Other | Admitting: Family Medicine

## 2019-07-03 ENCOUNTER — Encounter: Payer: Self-pay | Admitting: Family Medicine

## 2019-07-03 ENCOUNTER — Other Ambulatory Visit: Payer: Self-pay

## 2019-07-03 VITALS — BP 123/73 | HR 66 | Temp 98.7°F | Ht 67.0 in | Wt 232.2 lb

## 2019-07-03 DIAGNOSIS — E0821 Diabetes mellitus due to underlying condition with diabetic nephropathy: Secondary | ICD-10-CM

## 2019-07-03 DIAGNOSIS — Z794 Long term (current) use of insulin: Secondary | ICD-10-CM

## 2019-07-03 DIAGNOSIS — K861 Other chronic pancreatitis: Secondary | ICD-10-CM | POA: Diagnosis not present

## 2019-07-03 DIAGNOSIS — Z09 Encounter for follow-up examination after completed treatment for conditions other than malignant neoplasm: Secondary | ICD-10-CM

## 2019-07-03 NOTE — Patient Instructions (Signed)
Acute Pancreatitis    The pancreas is a gland that is located behind the stomach on the left side of the abdomen. It produces enzymes that help to digest food. The pancreas also releases the hormones glucagon and insulin, which help to regulate blood sugar. Acute pancreatitis happens when inflammation of the pancreas suddenly occurs and the pancreas becomes irritated and swollen. Most acute attacks last a few days and cause serious problems. Some people become dehydrated and develop low blood pressure. In severe cases, bleeding in the abdomen can lead to shock and can be life-threatening. The lungs, heart, and kidneys may fail. What are the causes? This condition may be caused by:  Alcohol abuse.  Drug abuse.  Gallstones or other conditions that can block the tube that drains the pancreas (pancreatic duct).  A tumor in the pancreas. Other causes include:  Certain medicines.  Exposure to certain chemicals.  Diabetes.  An infection in the pancreas.  Damage caused by an accident (trauma).  The poison (venom) from a scorpion bite.  Abdominal surgery.  Autoimmune pancreatitis. This is when the body's disease-fighting (immune) system attacks the pancreas.  Genes that are passed from parent to child (inherited). In some cases, the cause of this condition is not known. What are the signs or symptoms? Symptoms of this condition include:  Pain in the upper abdomen that may radiate to the back. Pain may be severe.  Tenderness and swelling of the abdomen.  Nausea and vomiting.  Fever. How is this diagnosed? This condition may be diagnosed based on:  A physical exam.  Blood tests.  Imaging tests, such as X-rays, CT or MRI scans, or an ultrasound of the abdomen. How is this treated? Treatment for this condition usually requires a stay in the hospital. Treatment for this condition may include:  Pain medicine.  Fluid replacement through an IV.  Placing a tube in the  stomach to remove stomach contents and to control vomiting (NG tube, or nasogastric tube).  Not eating for 3-4 days. This gives the pancreas a rest, because enzymes are not being produced that can cause further damage.  Antibiotic medicines, if your condition is caused by an infection.  Treating any underlying conditions that may be the cause.  Steroid medicines, if your condition is caused by your immune system attacking your body's own tissues (autoimmune disease).  Surgery on the pancreas or gallbladder. Follow these instructions at home: Eating and drinking   Follow instructions from your health care provider about diet. This may involve avoiding alcohol and decreasing the amount of fat in your diet.  Eat smaller, more frequent meals. This reduces the amount of digestive fluids that the pancreas produces.  Drink enough fluid to keep your urine pale yellow.  Do not drink alcohol if it caused your condition. General instructions  Take over-the-counter and prescription medicines only as told by your health care provider.  Do not drive or use heavy machinery while taking prescription pain medicine.  Ask your health care provider if the medicine prescribed to you can cause constipation. You may need to take steps to prevent or treat constipation, such as: ? Take an over-the-counter or prescription medicine for constipation. ? Eat foods that are high in fiber such as whole grains and beans. ? Limit foods that are high in fat and processed sugars, such as fried or sweet foods.  Do not use any products that contain nicotine or tobacco, such as cigarettes, e-cigarettes, and chewing tobacco. If you need help quitting,  ask your health care provider.  Get plenty of rest.  If directed, check your blood sugar at home as told by your health care provider.  Keep all follow-up visits as told by your health care provider. This is important. Contact a health care provider if you:  Do not  recover as quickly as expected.  Develop new or worsening symptoms.  Have persistent pain, weakness, or nausea.  Recover and then have another episode of pain.  Have a fever. Get help right away if:  You cannot eat or keep fluids down.  Your pain becomes severe.  Your skin or the white part of your eyes turns yellow (jaundice).  You have sudden swelling in your abdomen.  You vomit.  You feel dizzy or you faint.  Your blood sugar is high (over 300 mg/dL). Summary  Acute pancreatitis happens when inflammation of the pancreas suddenly occurs and the pancreas becomes irritated and swollen.  This condition is typically caused by alcohol abuse, drug abuse, or gallstones.  Treatment for this condition usually requires a stay in the hospital. This information is not intended to replace advice given to you by your health care provider. Make sure you discuss any questions you have with your health care provider. Document Released: 12/07/2005 Document Revised: 09/26/2018 Document Reviewed: 06/13/2018 Elsevier Patient Education  2020 [employer].

## 2019-07-03 NOTE — Progress Notes (Addendum)
Hospital follow-up  Impression and Recommendations:    1. Hospital discharge follow-up   2. Acute on Chronic pancreatitis, unspecified pancreatitis type ([place])   3. Diabetes mellitus due to underlying condition with diabetic nephropathy, with long-term current use of insulin [facility])     Hospital discharge follow-up - Plan: pt stable, no longer with symptoms. -Eating and drinking within normal limits. -Completely understands foods to avoid etc. -Has scheduled KUB this Wednesday in 2 days and follow-up with GI right afterward.  Per patient it will likely be a telehealth visit.  Acute on Chronic pancreatitis, unspecified pancreatitis type ([place]) - Plan: Patient will follow-up with her pancreatitis specialist at [place] Dr. Tillie Rung since this episode likely came on post ERCP with stent placement -Reviewed lipase and other lab abnormalities were correcting and moving in the right direction.  Will be seeing GI very near future-end of week-and they will obtain labs if needed.  Diabetes mellitus due to underlying condition with diabetic nephropathy, with long-term current use of insulin ([place])   - Plan: A1c well controlled when last checked.   Best A1c she has had in long time.  She will continue to check fasting blood sugar and 2-hour postprandial and follow-up with sooner than planned if problems Lab Results  Component Value Date   HGBA1C 5.8 (H) 06/22/2019   HGBA1C 6.9 (H) 02/07/2019   HGBA1C 6.4 (A) 09/19/2018  -Patient needs urine microalbumin to creatinine ratio however she is unable to pee today.  Will obtain next time.  Expresses verbal understanding and consents to current therapy and treatment regimen.  No barriers to understanding were identified.  Red flag symptoms and signs discussed in detail.  Patient expressed understanding regarding what to do in case of emergency\urgent symptoms  Please see AVS handed out to patient at the end of our visit for further patient  instructions/ counseling done pertaining to today's office visit.   Return for 3-4 mo- routine care- DM, BP, etc.     Note:  This note was prepared with assistance of Dragon voice recognition software. Occasional wrong-word or sound-a-like substitutions may have occurred due to the inherent limitations of voice recognition software.  [name], DO 07/03/2019 5:04 PM     --------------------------------------------------------------------------------------------------------------------------------------------------------------------------------------------------------------------------------------------    Subjective:     HPI: Jasmine Davila is a 68 y.o. female who presents to [place] [facility] today for issues as discussed below.  ERCP done by [facility] 6/30- which caused her pancreatitis  flair this past time--> sent to [facility] July 1st--> intense abd pains.  She goes this Wed to [facility] to see if stent still in there- if it is, will need ENdoscopy to remove.   - F/up appt with GI after x-ray resulted.     Patient is eating and drinking fine.  Since this is 1 of several bouts of pancreatitis she has had she is well versed in what foods to stay clear of etc. she denies abdominal pain today.  She denies nausea etc.  -I reviewed [facility] notes from 06/20/2019-ERCP with placement of stent as well as [place] ED notes from 7/ 1 admit date and discharge 7/3.  All labs/tests were also reviewed    Wt Readings from Last 3 Encounters:  07/03/19 232 lb 3.2 oz (105.3 kg)  06/23/19 251 lb 6.4 oz (114 kg)  04/25/19 275 lb (124.7 kg)   BP Readings from Last 3 Encounters:  07/03/19 123/73  06/23/19 (!) 141/49  04/25/19 (!) 140/58   Pulse Readings from Last 3 Encounters:  07/03/19 66  06/23/19 68  04/25/19 (!) 58   BMI Readings from Last 3 Encounters:  07/03/19 36.37 kg/m  06/23/19 39.37 kg/m  04/25/19 43.07 kg/m     Patient Care Team     Relationship Specialty Notifications Start End  [name], DO PCP - General Family Medicine  11/24/16   [name], MD Consulting Physician Oncology  11/24/16   Clarene Essex, MD Consulting Physician Gastroenterology  04/20/18   Alda Berthold, DO Consulting Physician Neurology  04/20/18   Edrick Kins, DPM Consulting Physician Podiatry  04/20/18   Care, [place] [facility]  Family Medicine  03/22/19   Arta Silence, MD Consulting Physician Gastroenterology  04/11/19   Holly Bodily, MD Referring Physician Gastroenterology  04/11/19    Comment: Specializing in pancreatitis- [facility]     Patient Active Problem List   Diagnosis Date Noted   Diabetes mellitus due to underlying condition with diabetic nephropathy, with long-term current use of insulin ([place]) 11/25/2016    Priority: High   Hypertension associated with diabetes ([place]) 11/24/2016    Priority: High   H/o Cancer:   B- cell lymphoma (spleen and lung) - non-Hodgkin's 11/24/2016    Priority: High   Diabetic neuropathy - exaccerbated by chemo 11/24/2016    Priority: High   Hyperlipidemia associated with type 2 diabetes mellitus ([place]) 11/24/2016    Priority: High   Endometrial disorder- trace endometrial canal fluid seen on CT scan recently and in 2016.  No GYN care many yrs, to GYN for TVUS and further w/up prn 04/20/2018    Priority: Medium   Morbid obesity ([place]) 11/25/2016    Priority: Medium   Smoking hx 11/24/2016    Priority: Medium   Chronic pancreatitis ([place]) 11/24/2016    Priority: Medium   Vitamin B6 deficiency (non anemic) 11/24/2016    Priority: Medium   GERD (gastroesophageal reflux disease) 11/24/2016    Priority: Low   B12 deficiency 11/24/2016    Priority: Low   Acute pancreatitis 06/21/2019   Pancreatitis 06/21/2019   Acute diverticulitis 06/21/2019   Abnormal LFTs 06/21/2019   Hyperbilirubinemia 06/21/2019   Left leg swelling 06/21/2019   Hyponatremia 06/21/2019    Hypochloremia 06/21/2019   Obesity 06/21/2019   Vitamin D insufficiency 04/11/2019   Lumbar spondylosis 02/28/2019   Chronic back pain 02/07/2019   Inflamed acrochordon 02/07/2019   Acrochordon- over R eye interferring with vision 02/07/2019   Acute otitis media 11/23/2018   Acute maxillary sinusitis 11/23/2018   History of malignant lymphoma 06/03/2018   Neuropathy 06/03/2018   Diabetic peripheral neuropathy associated with type 2 diabetes mellitus ([place]) 04/20/2018   Acute on chronic pancreatitis ([place]) 04/13/2018   Onychomycosis 03/16/2018   Neuropathic pain syndrome (non-herpetic) 03/16/2018   Sleep apnea with CPAP 12/29/2016   History of decreased platelet count 12/29/2016    Past Medical history, Surgical history, Family history, Social history, Allergies and Medications have been entered into the medical record, reviewed and changed as needed.    Current Meds  Medication Sig   aspirin EC 81 MG tablet Take 81 mg by mouth at bedtime.    Cholecalciferol (VITAMIN D-3) 5000 units TABS Take 1 tablet by mouth daily.   cyanocobalamin (,VITAMIN B-12,) 1000 MCG/ML injection Inject 0.5 mLs (500 mcg total) into the muscle every 30 (thirty) days.   famotidine (PEPCID) 20 MG tablet TAKE 1 TABLET BY MOUTH 2 TIMES DAILY.  fluticasone (FLONASE) 50 MCG/ACT nasal spray PLACE 1 SPRAY INTO BOTH NOSTRILS DAILY AS NEEDED FOR ALLERGIES OR RHINITIS.   folic acid (FOLVITE) 098 MCG tablet Take 400 mcg by mouth daily.   gabapentin (NEURONTIN) 300 MG capsule TAKE 4 CAPSULES BY MOUTH 3 TIMES DAILY. (Patient taking differently: Take 1,200 mg by mouth 3 (three) times daily. )   HUMALOG KWIKPEN 100 UNIT/ML KwikPen INJECT 20 UNITS INTO THE SKIN DAILY WITH LARGEST MEAL (Patient taking differently: Inject 20 Units into the skin every morning. )   HYDROcodone-acetaminophen (NORCO/VICODIN) 5-325 MG tablet Take 1 tablet by mouth every 6 (six) hours as needed for moderate pain.   LANTUS  SOLOSTAR 100 UNIT/ML Solostar Pen INJECT 60 UNITS INTO THE SKIN EVERY MORNING. (Patient taking differently: Inject 60 Units into the skin daily. )   losartan (COZAAR) 25 MG tablet TAKE 1 TABLET BY MOUTH DAILY.   Multiple Vitamin (MULTIVITAMIN) tablet Take 1 tablet by mouth daily.   niacin (NIASPAN) 1000 MG CR tablet Take 1 tablet (1,000 mg total) by mouth at bedtime. An hour after 81 mg ASA   ondansetron (ZOFRAN ODT) 4 MG disintegrating tablet Take 1 tablet (4 mg total) by mouth every 8 (eight) hours as needed for nausea or vomiting.   ONE TOUCH ULTRA TEST test strip USE 1 STRIP TO CHECK GLUCOSE THREE TIMES DAILY AS NEEDED    Allergies:  Allergies  Allergen Reactions   Dilaudid [Hydromorphone Hcl] Other (See Comments)    anxious   Doxycycline Other (See Comments)    Pancreatic issues   Metronidazole Other (See Comments)    Cream  Burned face   Pseudoephedrine Hcl Other (See Comments)    Pt unsure     Review of Systems:  A fourteen system review of systems was performed and found to be positive as per HPI.   Objective:   Blood pressure 123/73, pulse 66, temperature 98.7 F (37.1 C), height 5\' 7"  (1.702 m), weight 232 lb 3.2 oz (105.3 kg), last menstrual period 12/21/1998, SpO2 98 %. Body mass index is 36.37 kg/m. General:  Well Developed, well nourished, appropriate for stated age.  Neuro:  Alert and oriented,  extra-ocular muscles intact  HEENT:  Normocephalic, atraumatic, neck supple, no carotid bruits appreciated  Skin:  no gross rash, warm, pink. Cardiac:  RRR, S1 S2 Respiratory:  ECTA B/L and A/P, Not using accessory muscles, speaking in full sentences- unlabored. Abdominal exam: Bowel sounds x4 present, no guarding rigidity rebound, soft and slightly distended. Vascular:  Ext warm, no cyanosis apprec.; cap RF less 2 sec. Psych:  No HI/SI, judgement and insight good, Euthymic mood. Full Affect.

## 2019-07-04 IMAGING — MR MR LUMBAR SPINE WO/W CM
4 of 7 series · 17 of 48 positions shown · IV contrast (Multihance 20ml)
Comparison: None similar

CLINICAL DATA: Lumbosacral radiculopathy. Weakness and numbness in
the hips to the legs.

EXAM:
MRI LUMBAR SPINE WITHOUT AND WITH CONTRAST
TECHNIQUE: Multiplanar and multiecho pulse sequences of the lumbar spine were
obtained without and with intravenous contrast.
Creatinine was obtained on site at [HOSPITAL] at [HOSPITAL].
Results: Creatinine 0.6 mg/dL.
CONTRAST:  20mL MULTIHANCE GADOBENATE DIMEGLUMINE 529 MG/ML IV SOLN

[Series 6: T1 · sagittal · 4.0mm · 0.73mm/px · 3 of 13 slices shown (1 of 2)]
[im 1/13]
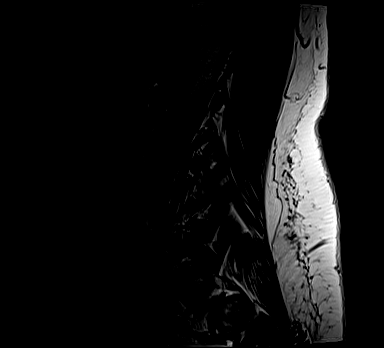
[im 7/13]
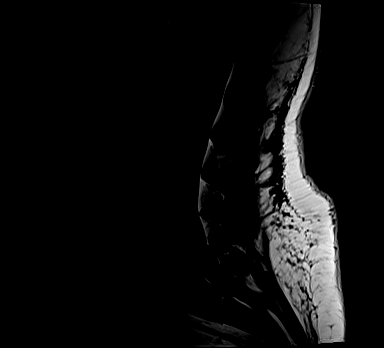
[im 13/13]
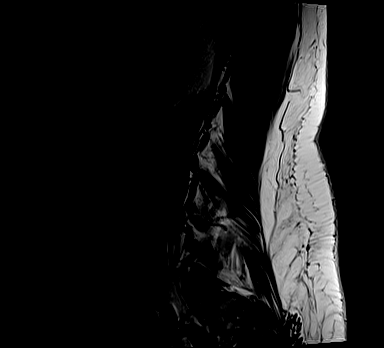

[Series 10: T1 · axial · 4.0mm · 0.30mm/px · z∈[-25,+168]mm · 3 of 39 slices shown (2 of 2)]
[im 4/39]
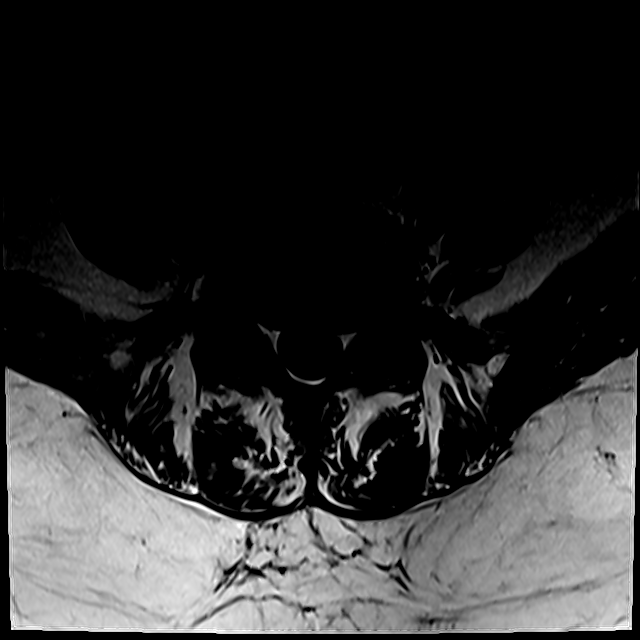
[im 20/39]
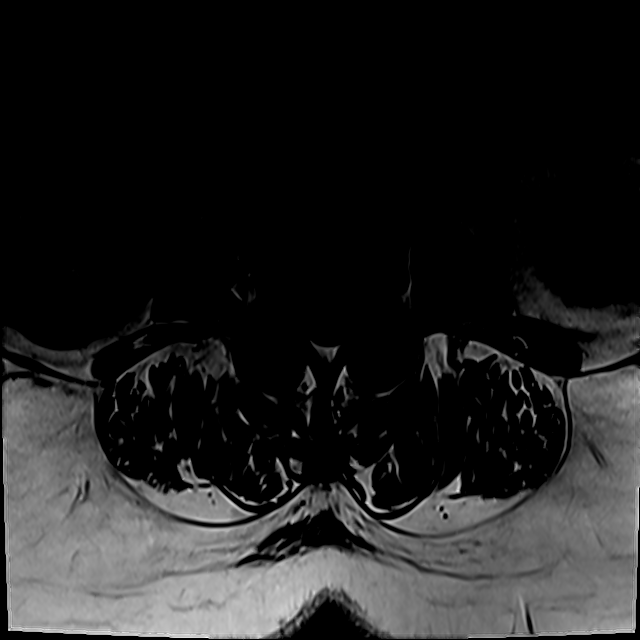
[im 35/39]
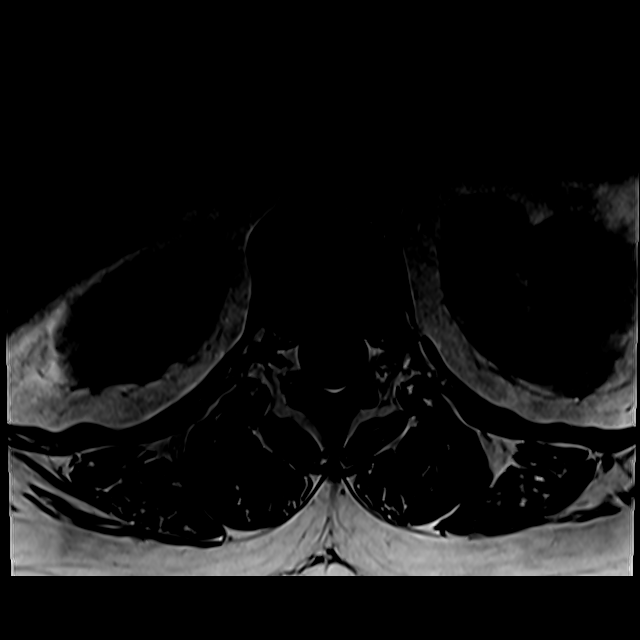

[Series 13: T2 · axial · 4.0mm · 0.30mm/px · z∈[-39,+168]mm · 8 of 39 slices shown (1 of 2)]
[im 1/39]
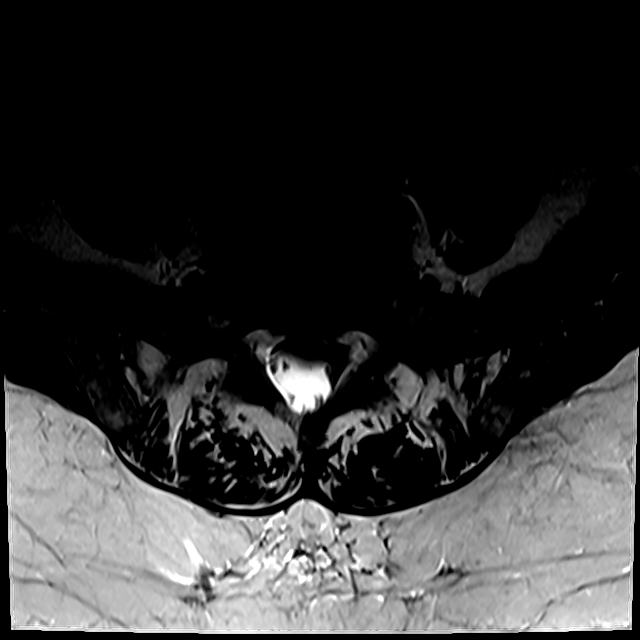
[im 4/39]
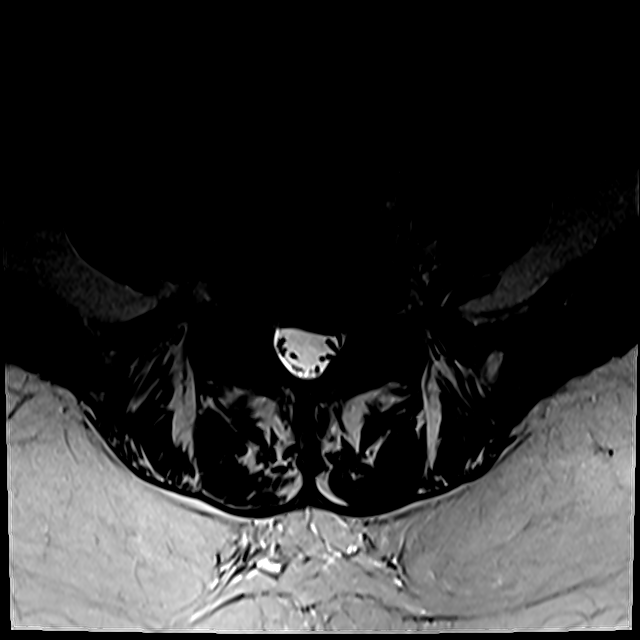
[im 8/39]
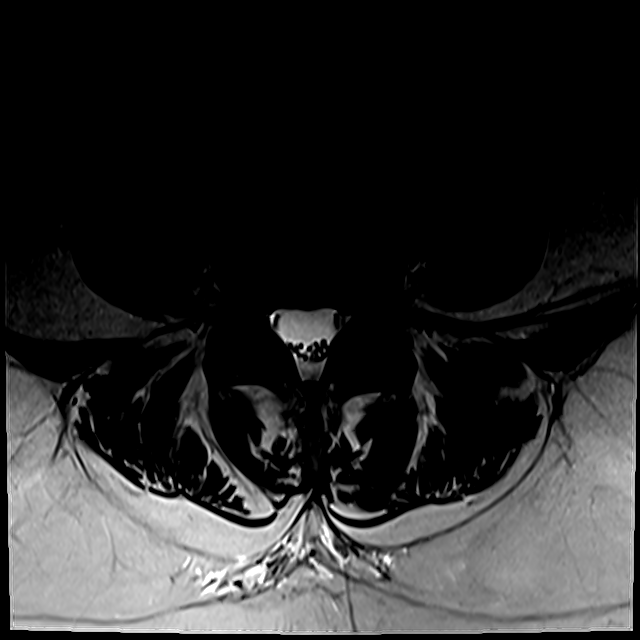
[im 12/39]
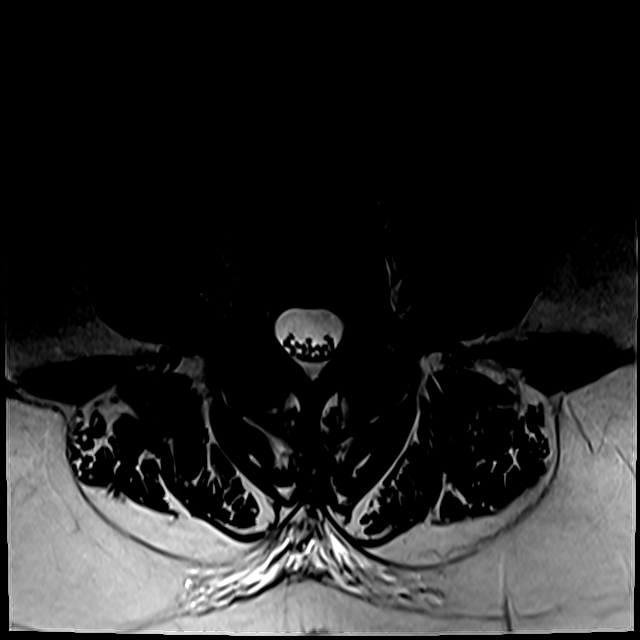
[im 16/39]
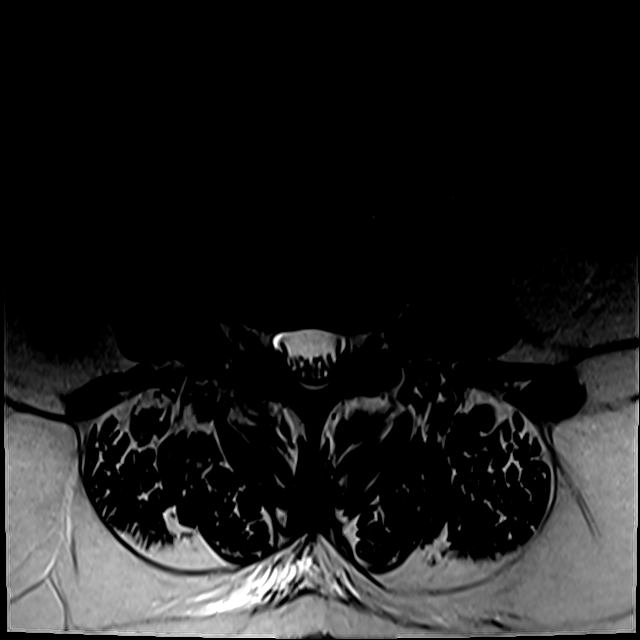
[im 20/39]
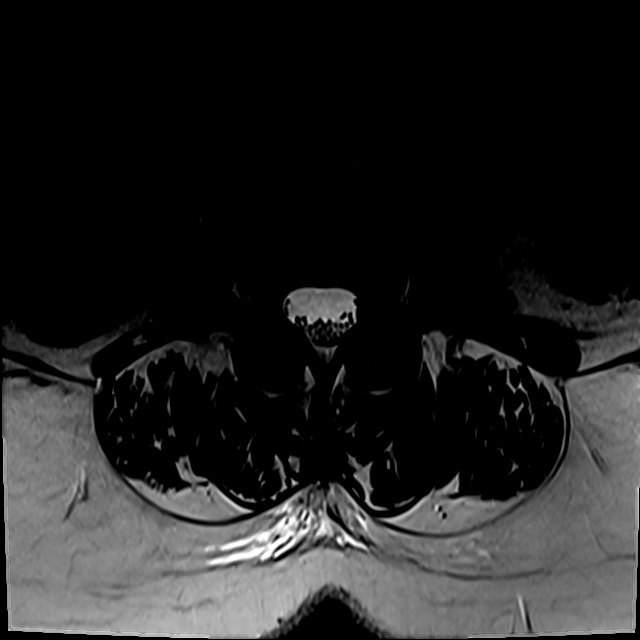
[im 23/39]
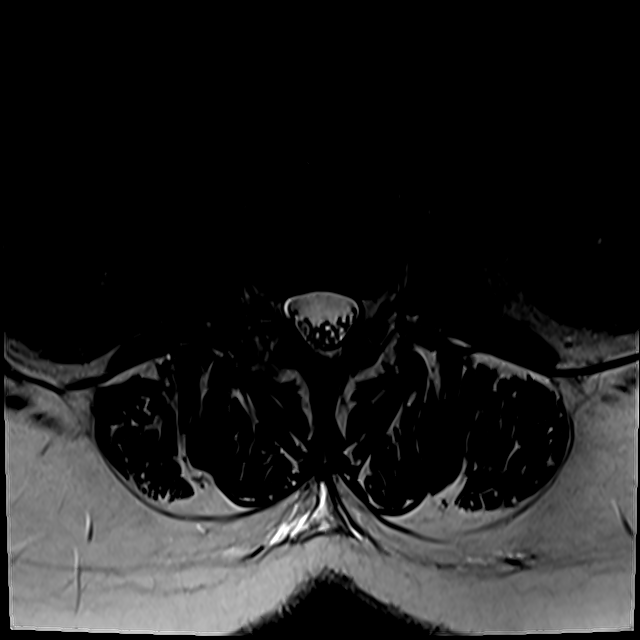
[im 35/39]
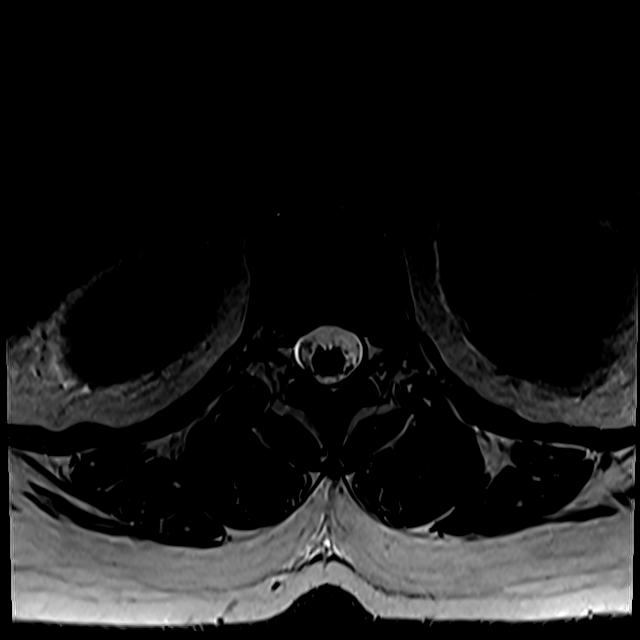

[Series 14: T2 · sagittal · 4.0mm · 0.73mm/px · 3 of 13 slices shown (2 of 2)]
[im 1/13]
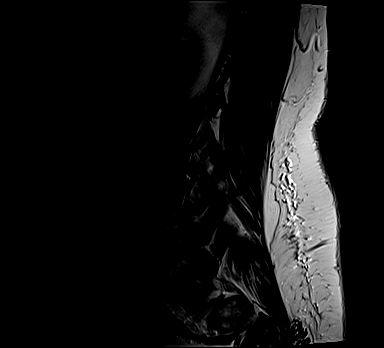
[im 9/13]
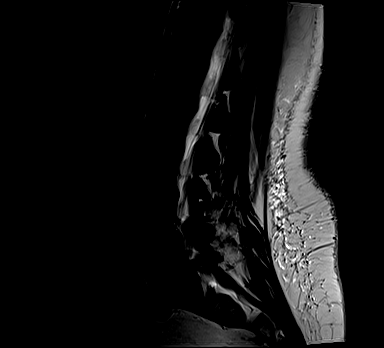
[im 13/13]
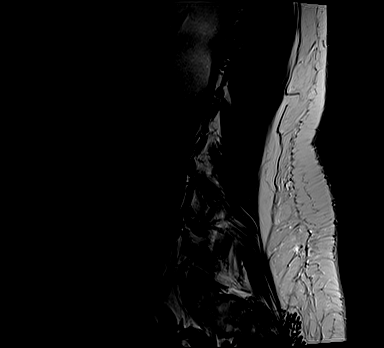

[17 of 48 positions shown; findings below may reference images not displayed]

FINDINGS: Segmentation:  5 lumbar type vertebral bodies

Alignment: Borderline retrolisthesis at L5-S1 and anterolisthesis at
L3-4

Vertebrae:  No fracture, evidence of discitis, or bone lesion.

Conus medullaris and cauda equina: Conus extends to the L1-2 level.
Conus and cauda equina appear normal.

Paraspinal and other soft tissues: Negative

Disc levels:

T12- L1: Mild spondylosis.  No impingement

L1-L2: Minor annulus bulging.  No impingement

L2-L3: Mild spondylosis.  No impingement

L3-L4: Mild facet spurring.  Minor annulus bulging.  No impingement

L4-L5: Disc narrowing and mild bulging. Negative facets. No
impingement

L5-S1:Disc narrowing with left paracentral to foraminal protrusion
superimposed on disc bulging. Height loss and disc causes left L5
foraminal impingement. Moderate right foraminal narrowing. Patent
canal
IMPRESSION: L5-S1 degenerative left foraminal impingement and moderate right
foraminal narrowing. The canal is diffusely patent.

## 2019-07-05 ENCOUNTER — Ambulatory Visit
Admission: RE | Admit: 2019-07-05 | Discharge: 2019-07-05 | Disposition: A | Discharge status: Home / Self Care | Payer: Medicare Other | Source: Ambulatory Visit | Attending: Gastroenterology | Admitting: Gastroenterology

## 2019-07-05 ENCOUNTER — Other Ambulatory Visit: Payer: Self-pay | Admitting: Gastroenterology

## 2019-07-05 DIAGNOSIS — Z9689 Presence of other specified functional implants: Secondary | ICD-10-CM

## 2019-07-08 ENCOUNTER — Other Ambulatory Visit: Payer: Self-pay | Admitting: Family Medicine

## 2019-08-01 DIAGNOSIS — C8339 Diffuse large B-cell lymphoma, extranodal and solid organ sites: Secondary | ICD-10-CM | POA: Diagnosis not present

## 2019-08-01 DIAGNOSIS — I517 Cardiomegaly: Secondary | ICD-10-CM | POA: Diagnosis not present

## 2019-08-01 DIAGNOSIS — K859 Acute pancreatitis without necrosis or infection, unspecified: Secondary | ICD-10-CM | POA: Diagnosis not present

## 2019-08-01 DIAGNOSIS — C8519 Unspecified B-cell lymphoma, extranodal and solid organ sites: Secondary | ICD-10-CM | POA: Diagnosis not present

## 2019-08-03 DIAGNOSIS — Z8579 Personal history of other malignant neoplasms of lymphoid, hematopoietic and related tissues: Secondary | ICD-10-CM | POA: Diagnosis not present

## 2019-08-03 DIAGNOSIS — C8339 Diffuse large B-cell lymphoma, extranodal and solid organ sites: Secondary | ICD-10-CM | POA: Diagnosis not present

## 2019-08-07 ENCOUNTER — Other Ambulatory Visit: Payer: Self-pay | Admitting: Family Medicine

## 2019-08-07 DIAGNOSIS — Z800 Family history of malignant neoplasm of digestive organs: Secondary | ICD-10-CM | POA: Diagnosis not present

## 2019-08-07 DIAGNOSIS — K859 Acute pancreatitis without necrosis or infection, unspecified: Secondary | ICD-10-CM | POA: Diagnosis not present

## 2019-09-21 ENCOUNTER — Other Ambulatory Visit: Payer: Self-pay

## 2019-09-21 ENCOUNTER — Ambulatory Visit (INDEPENDENT_AMBULATORY_CARE_PROVIDER_SITE_OTHER): Payer: Medicare Other

## 2019-09-21 DIAGNOSIS — Z23 Encounter for immunization: Secondary | ICD-10-CM

## 2019-09-21 NOTE — Progress Notes (Signed)
Pt here for influenza vaccine.  Screening questionnaire reviewed, VIS provided to patient, and any/all patient questions answered.  T. Nelson, CMA  

## 2019-09-27 ENCOUNTER — Other Ambulatory Visit: Payer: Self-pay | Admitting: Family Medicine

## 2019-10-12 DIAGNOSIS — K859 Acute pancreatitis without necrosis or infection, unspecified: Secondary | ICD-10-CM | POA: Diagnosis not present

## 2019-10-20 ENCOUNTER — Other Ambulatory Visit: Payer: Self-pay | Admitting: Family Medicine

## 2019-10-31 DIAGNOSIS — Z1159 Encounter for screening for other viral diseases: Secondary | ICD-10-CM | POA: Diagnosis not present

## 2019-11-02 ENCOUNTER — Ambulatory Visit: Payer: Medicare Other | Admitting: Family Medicine

## 2019-11-03 DIAGNOSIS — K5730 Diverticulosis of large intestine without perforation or abscess without bleeding: Secondary | ICD-10-CM | POA: Diagnosis not present

## 2019-11-03 DIAGNOSIS — Z800 Family history of malignant neoplasm of digestive organs: Secondary | ICD-10-CM | POA: Diagnosis not present

## 2019-11-03 LAB — HM COLONOSCOPY

## 2019-11-20 ENCOUNTER — Ambulatory Visit (INDEPENDENT_AMBULATORY_CARE_PROVIDER_SITE_OTHER): Payer: Medicare Other | Admitting: Family Medicine

## 2019-11-20 ENCOUNTER — Encounter: Payer: Self-pay | Admitting: Family Medicine

## 2019-11-20 ENCOUNTER — Other Ambulatory Visit: Payer: Self-pay

## 2019-11-20 ENCOUNTER — Ambulatory Visit: Payer: Medicare Other | Admitting: Family Medicine

## 2019-11-20 VITALS — BP 128/70 | Ht 67.0 in | Wt 232.0 lb

## 2019-11-20 DIAGNOSIS — E0821 Diabetes mellitus due to underlying condition with diabetic nephropathy: Secondary | ICD-10-CM

## 2019-11-20 DIAGNOSIS — I152 Hypertension secondary to endocrine disorders: Secondary | ICD-10-CM

## 2019-11-20 DIAGNOSIS — C801 Malignant (primary) neoplasm, unspecified: Secondary | ICD-10-CM

## 2019-11-20 DIAGNOSIS — E1159 Type 2 diabetes mellitus with other circulatory complications: Secondary | ICD-10-CM | POA: Diagnosis not present

## 2019-11-20 DIAGNOSIS — K861 Other chronic pancreatitis: Secondary | ICD-10-CM

## 2019-11-20 DIAGNOSIS — E1142 Type 2 diabetes mellitus with diabetic polyneuropathy: Secondary | ICD-10-CM

## 2019-11-20 DIAGNOSIS — Z794 Long term (current) use of insulin: Secondary | ICD-10-CM | POA: Diagnosis not present

## 2019-11-20 DIAGNOSIS — E1169 Type 2 diabetes mellitus with other specified complication: Secondary | ICD-10-CM | POA: Diagnosis not present

## 2019-11-20 DIAGNOSIS — E538 Deficiency of other specified B group vitamins: Secondary | ICD-10-CM | POA: Diagnosis not present

## 2019-11-20 DIAGNOSIS — I10 Essential (primary) hypertension: Secondary | ICD-10-CM

## 2019-11-20 DIAGNOSIS — E531 Pyridoxine deficiency: Secondary | ICD-10-CM | POA: Diagnosis not present

## 2019-11-20 DIAGNOSIS — E785 Hyperlipidemia, unspecified: Secondary | ICD-10-CM | POA: Diagnosis not present

## 2019-11-20 MED ORDER — FREESTYLE LIBRE READER DEVI: 1.0000 | Freq: Every day | 0 refills | Status: DC

## 2019-11-20 MED ORDER — HUMALOG 100 UNIT/ML ~~LOC~~ SOCT: SUBCUTANEOUS | 0 refills | Status: DC

## 2019-11-20 MED ORDER — LANTUS SOLOSTAR 100 UNIT/ML ~~LOC~~ SOPN: 70.0000 [IU] | PEN_INJECTOR | Freq: Every day | SUBCUTANEOUS | 0 refills | Status: DC

## 2019-11-20 MED ORDER — FREESTYLE LIBRE 2 SENSOR SYSTM MISC: 1.0000 | Freq: Every day | 0 refills | Status: DC

## 2019-11-20 MED ORDER — GABAPENTIN 600 MG PO TABS: ORAL_TABLET | ORAL | 3 refills | Status: DC

## 2019-11-20 NOTE — Progress Notes (Signed)
Telehealth office visit note for Jasmine Davila, D.O- at Primary Care at [facility]   I connected with current patient today and verified that I am speaking with the correct person using two identifiers.   . Location of the patient: Home . Location of the provider: Office Only the patient (+/- their family members at pt's discretion) and myself were participating in the encounter - This visit type was conducted due to national recommendations for restrictions regarding the COVID-19 Pandemic (e.g. social distancing) in an effort to limit this patient's exposure and mitigate transmission in our community.  This format is felt to be most appropriate for this patient at this time.   - The patient did not have access to video technology or had technical difficulties with video requiring transitioning to audio format only. - No physical exam could be performed with this format, beyond that communicated to [place] by the patient/ family members as noted.   - Additionally my office staff/ schedulers discussed with the patient that there may be a monetary charge related to this service, depending on their medical insurance.   The patient expressed understanding, and agreed to proceed.       History of Present Illness: I, Jasmine Davila, am serving as scribe for Dr. Mellody Davila.  [name]  Last seen after acute pancreatitis episode in July 2020. Denies GI upset or other symptoms of pancreatitis since last visit / acute episode.  - Diabetic Polyneuropathy Wishes to adjust her dosage of gabapentin to two 600 mg tablets 3 times daily, instead of four 300 mg tablets 3 times daily.  HPI:   Diabetes Mellitus:  Home glucose readings:  Notes "working better with me doing sliding scale."  Notes she's going through her quick pen insulin quicker during the day.  Wanting to use [employer] system to try if insurance will cover it.   - Patient reports good compliance with  therapy plan: medication and/or lifestyle modification  - Her denies acute concerns or problems related to treatment plan  - She denies new concerns.  Denies polyuria/polydipsia, hypo/ hyperglycemia symptoms.  Denies new onset of: chest pain, exercise intolerance, shortness of breath, dizziness, visual changes, headache, lower extremity swelling or claudication.   Last A1C in the office was:  Lab Results  Component Value Date   HGBA1C 5.8 (H) 06/22/2019   HGBA1C 6.9 (H) 02/07/2019   HGBA1C 6.4 (A) 09/19/2018   Lab Results  Component Value Date   MICROALBUR 10 03/04/2017   LDLCALC 60 06/22/2019   CREATININE 0.58 06/23/2019   BP Readings from Last 3 Encounters:  11/20/19 128/70  07/03/19 123/73  06/23/19 (!) 141/49   Wt Readings from Last 3 Encounters:  11/20/19 232 lb (105.2 kg)  07/03/19 232 lb 3.2 oz (105.3 kg)  06/23/19 251 lb 6.4 oz (114 kg)   HPI:  Hypertension:  -  Her blood pressure at home has been controlled.  - Patient reports good compliance with medication and/or lifestyle modification  - Her denies acute concerns or problems related to treatment plan  - She denies new onset of: chest pain, exercise intolerance, shortness of breath, dizziness, visual changes, headache, lower extremity swelling or claudication.   Last 3 blood pressure readings in our office are as follows: BP Readings from Last 3 Encounters:  11/20/19 128/70  07/03/19 123/73  06/23/19 (!) 141/49   Filed Weights   11/20/19 1328  Weight: 232 lb (105.2 kg)  GAD 7 : Generalized Anxiety Score 04/25/2019  Nervous, Anxious, on Edge 0  Control/stop worrying 0  Worry too much - different things 0  Trouble relaxing 0  Restless 0  Easily annoyed or irritable 0  Afraid - awful might happen 0  Total GAD 7 Score 0  Anxiety Difficulty Not difficult at all    Depression screen [facility] 2/9 11/20/2019 04/25/2019 04/11/2019 02/07/2019 11/23/2018  Decreased Interest 0 0 0 0 0  Down, Depressed,  Hopeless 0 0 0 0 0  PHQ - 2 Score 0 0 0 0 0  Altered sleeping 0 0 0 0 0  Tired, decreased energy 0 0 0 0 0  Change in appetite 0 0 0 0 0  Feeling bad or failure about yourself  0 0 0 0 0  Trouble concentrating 0 0 0 0 0  Moving slowly or fidgety/restless 0 0 0 0 0  Suicidal thoughts 0 0 0 0 0  PHQ-9 Score 0 0 0 0 0  Difficult doing work/chores - Somewhat difficult Not difficult at all Not difficult at all -  Some recent data might be hidden      Impression and Recommendations:    1. Hypertension associated with diabetes ([place])   2. Hyperlipidemia associated with type 2 diabetes mellitus ([place])   3. Diabetes mellitus due to underlying condition with diabetic nephropathy, with long-term current use of insulin ([place])   4. Other chronic pancreatitis ([place])   5. Diabetic polyneuropathy associated with type 2 diabetes mellitus ([place])   6. Vitamin B6 deficiency (non anemic)   7. B12 deficiency   8. H/o Cancer:   B- cell lymphoma (spleen and lung) - non-Hodgkin's      - Last seen after hospitalization for pancreatitis. - Need for re-check lab work near future.  B6 Deficiency, B12 Deficiency - Due for re-check near future. - Continue management as etsablished. - Will continue to monitor.  Diabetes Mellitus - Last A1c done 06/22/2019, 5.8, at goal.  - Per patient, going through her quick pen insulin very fast. - Discussed modifying insulin delivery system with patient today. - Reviewed long-acting basal insulin vs sliding scale short-acting insulin.  - Changes made to prescription today.  See med list. - Patient agrees to adjust insulin dose to 70. See med list.  - Patient will call with confusion regarding insulin/dosage.  - With sliding scale, told patient to modify based on blood sugar prior to eating. - Education provided to patient today regarding prudent insulin dosage. - Discussed referral to diabetic nutritionist today.  See orders. - Note sent to nutritionist today.  -  Discussed freestyle libre system with patient today. - Discussed patient's need to check blood sugar several times daily.  - Counseled patient on pathophysiology of disease and discussed various treatment options, which always includes dietary and lifestyle modification as first line.    - Importance of low carb, heart-healthy diet discussed with patient in addition to regular aerobic exercise of 6min 5d/week or more.   - Check FBS and 2 hours after the biggest meal of your day.  Keep log and bring in next OV for my review.     - Also told patient if you ever feel poorly, please check your blood pressure and blood sugar, as one or the other could be the cause of your symptoms.  - Pt reminded about need for yearly eye and foot exams.  Told patient to make appt.for diabetic eye exam, CMAs here will do foot exams  -  Handouts provided at patient's desire and or told to go online at the American Diabetes Association website for further information  - Need for A1c re-check since 6 months since last check. - Will continue to monitor.  Diabetic Polyneuropathy - Patient wishes to change dosage of gabapentin to 600 mg. - Patient will take two tablets, three times per day.  See med list.  - Stable on current treatment plan. - Will continue to monitor.  Hyperlipidemia associated with DM - Last FLP 06/22/2019 - LDL = 60 - HDL = 38 - Triglycerides = 75   - Cholesterol levels at goal last check. - Continue treatment plan as established.  - Dietary changes such as low saturated & trans fat diets for hyperlipidemia and low carb diets for hypertriglyceridemia discussed with patient.    - Encouraged patient to follow AHA guidelines for regular exercise and also engage in weight loss if BMI above 25.   - Educational handouts provided at patient's desire and/ or told to look online at the [place] website for further information.  - We will continue to monitor  Hypertension  associated with DM - BP stable at this time. - Patient will continue current treatment regimen.  - Counseled patient on pathophysiology of disease and discussed various treatment options, which always includes dietary and lifestyle modification as first line.   - Lifestyle changes such as dash and heart healthy diets and engaging in a regular exercise program discussed extensively with patient.   - Ambulatory blood pressure monitoring encouraged at least 3 times weekly.  Keep log and bring in every office visit.  Reminded patient that if they ever feel poorly in any way, to check their blood pressure and pulse.  - Handouts provided at patient's desire and/or told to go online at the [place] website for further information  - We will continue to monitor  Acute Pancreatitis in July 2020 - Chronic Pancreatitis - Last serum creatinine 0.58 in July of 2020.  - Last AST = 58, down from 210 two days prior (July 2020) - Last ALT = 113, down from 186 two days prior (July 2020)   Recommendations - Need for CMP, CBC, A1c, B6, B12 near future. - OV in 3 months to discuss sliding scale/Lantus   - As part of my medical decision making, I reviewed the following data within the [place] History obtained from pt /family, CMA notes reviewed and incorporated if applicable, Labs reviewed, Radiograph/ tests reviewed if applicable and OV notes from prior OV's with me, as well as other specialists she/he has seen since seeing me last, were all reviewed and used in my medical decision making process today.    - Additionally, discussion had with patient regarding our treatment plan, and their biases/concerns about that plan were used in my medical decision making today.    - The patient agreed with the plan and demonstrated an understanding of the instructions.   No barriers to understanding were identified.    - Red flag symptoms and signs discussed in detail.  Patient  expressed understanding regarding what to do in case of emergency\ urgent symptoms.   - The patient was advised to call back or seek an in-person evaluation if the symptoms worsen or if the condition fails to improve as anticipated.   Return for near future for blood work as advised CMP, CBC, A1c, B6, B12; 3 months for sliding scale/Lantus.    Orders Placed This Encounter  Procedures  .  Comprehensive metabolic panel  . CBC with Diff/Plt  . Hemoglobin A1c  . Vit B12  . Vitamin B6  . Ambulatory referral to Nutrition and Diabetic Education    Meds ordered this encounter  Medications  . Insulin Glargine (LANTUS SOLOSTAR) 100 UNIT/ML Solostar Pen    Sig: Inject 70 Units into the skin daily.    Dispense:  60 mL    Refill:  0  . insulin lispro (HUMALOG) 100 UNIT/ML cartridge    Sig: 20 units TID with meals    Dispense:  45 mL    Refill:  0  . gabapentin (NEURONTIN) 600 MG tablet    Sig: Two tabs PO TID    Dispense:  540 tablet    Refill:  3  . Continuous Blood Gluc Sensor (FREESTYLE LIBRE 2 SENSOR SYSTM) MISC    Sig: 1 each by Does not apply route 6 (six) times daily.    Dispense:  6 each    Refill:  0  . Continuous Blood Gluc Receiver (FREESTYLE LIBRE READER) DEVI    Sig: 1 each by Does not apply route 6 (six) times daily. Check blood sugar prior to eating 2 hrs post prandial TID    Dispense:  1 each    Refill:  0    Medications Discontinued During This Encounter  Medication Reason  . gabapentin (NEURONTIN) 300 MG capsule   . LANTUS SOLOSTAR 100 UNIT/ML Solostar Pen Reorder     I provided 32+ minutes of non face-to-face time during this encounter.  Additional time was spent with charting and coordination of care after the actual visit commenced.   Note:  This note was prepared with assistance of Dragon voice recognition software. Occasional wrong-word or sound-a-like substitutions may have occurred due to the inherent limitations of voice recognition software.  This  document serves as a record of services personally performed by Jasmine Dance, DO. It was created on her behalf by Jasmine Davila, a trained medical scribe. The creation of this record is based on the scribe's personal observations and the provider's statements to them.   This case required medical decision making of at least moderate complexity. The above documentation has been reviewed to be accurate and was completed by Marjory Sneddon, D.O.       Patient Care Team    Relationship Specialty Notifications Start End  Jasmine Dance, DO PCP - General Family Medicine  11/24/16   [name], MD Consulting Physician Oncology  11/24/16   Clarene Essex, MD Consulting Physician Gastroenterology  04/20/18   Alda Berthold, DO Consulting Physician Neurology  04/20/18   Edrick Kins, DPM Consulting Physician Podiatry  04/20/18   [facility], [place] [facility]  Family Medicine  03/22/19   Arta Silence, MD Consulting Physician Gastroenterology  04/11/19   Holly Bodily, MD Referring Physician Gastroenterology  04/11/19    Comment: Specializing in pancreatitis- [facility]     -Vitals obtained; medications/ allergies reconciled;  personal medical, social, Sx etc.histories were updated by CMA, reviewed by me and are reflected in chart   Patient Active Problem List   Diagnosis Date Noted  . Diabetes mellitus due to underlying condition with diabetic nephropathy, with long-term current use of insulin ([place]) 11/25/2016    Priority: High  . Hypertension associated with diabetes ([place]) 11/24/2016    Priority: High  . H/o Cancer:   B- cell lymphoma (spleen and lung) - non-Hodgkin's 11/24/2016    Priority: High  . Diabetic neuropathy - exaccerbated  by chemo 11/24/2016    Priority: High  . Hyperlipidemia associated with type 2 diabetes mellitus ([place]) 11/24/2016    Priority: High  . Endometrial disorder- trace endometrial canal fluid seen on CT scan recently and in 2016.  No GYN care  many yrs, to GYN for TVUS and further w/up prn 04/20/2018    Priority: Medium  . Morbid obesity ([place]) 11/25/2016    Priority: Medium  . Smoking hx 11/24/2016    Priority: Medium  . Chronic pancreatitis ([place]) 11/24/2016    Priority: Medium  . Vitamin B6 deficiency (non anemic) 11/24/2016    Priority: Medium  . GERD (gastroesophageal reflux disease) 11/24/2016    Priority: Low  . B12 deficiency 11/24/2016    Priority: Low  . Acute pancreatitis 06/21/2019  . Pancreatitis 06/21/2019  . Acute diverticulitis 06/21/2019  . Abnormal LFTs 06/21/2019  . Hyperbilirubinemia 06/21/2019  . Left leg swelling 06/21/2019  . Hyponatremia 06/21/2019  . Hypochloremia 06/21/2019  . Obesity 06/21/2019  . Vitamin D insufficiency 04/11/2019  . Lumbar spondylosis 02/28/2019  . Chronic back pain 02/07/2019  . Inflamed acrochordon 02/07/2019  . Acrochordon- over R eye interferring with vision 02/07/2019  . Acute otitis media 11/23/2018  . Acute maxillary sinusitis 11/23/2018  . History of malignant lymphoma 06/03/2018  . Neuropathy 06/03/2018  . Diabetic peripheral neuropathy associated with type 2 diabetes mellitus ([place]) 04/20/2018  . Acute on chronic pancreatitis ([place]) 04/13/2018  . Onychomycosis 03/16/2018  . Neuropathic pain syndrome (non-herpetic) 03/16/2018  . Sleep apnea with CPAP 12/29/2016  . History of decreased platelet count 12/29/2016     Current Meds  Medication Sig  . aspirin EC 81 MG tablet Take 81 mg by mouth at bedtime.   . Cholecalciferol (VITAMIN D-3) 5000 units TABS Take 1 tablet by mouth daily.  . cyanocobalamin (,VITAMIN B-12,) 1000 MCG/ML injection Inject 0.5 mLs (500 mcg total) into the muscle every 30 (thirty) days.  . famotidine (PEPCID) 20 MG tablet TAKE 1 TABLET BY MOUTH 2 TIMES DAILY.  . fluticasone (FLONASE) 50 MCG/ACT nasal spray PLACE 1 SPRAY INTO BOTH NOSTRILS DAILY AS NEEDED FOR ALLERGIES OR RHINITIS.  . folic acid (FOLVITE) A999333 MCG tablet Take 400 mcg by mouth  daily.  Marland Kitchen HUMALOG KWIKPEN 100 UNIT/ML KwikPen INJECT 20 UNITS INTO THE SKIN EVERY MORNING. NEEDS A FOLLOW UP BEFORE MORE REFILLS.  Marland Kitchen HYDROcodone-acetaminophen (NORCO/VICODIN) 5-325 MG tablet Take 1 tablet by mouth every 6 (six) hours as needed for moderate pain.  . Insulin Glargine (LANTUS SOLOSTAR) 100 UNIT/ML Solostar Pen Inject 70 Units into the skin daily.  Marland Kitchen losartan (COZAAR) 25 MG tablet TAKE 1 TABLET BY MOUTH DAILY.  . Multiple Vitamin (MULTIVITAMIN) tablet Take 1 tablet by mouth daily.  . niacin (NIASPAN) 1000 MG CR tablet Take 1 tablet (1,000 mg total) by mouth at bedtime. An hour after 81 mg ASA  . ondansetron (ZOFRAN ODT) 4 MG disintegrating tablet Take 1 tablet (4 mg total) by mouth every 8 (eight) hours as needed for nausea or vomiting.  . ONE TOUCH ULTRA TEST test strip USE 1 STRIP TO CHECK GLUCOSE THREE TIMES DAILY AS NEEDED  . [DISCONTINUED] gabapentin (NEURONTIN) 300 MG capsule TAKE 4 CAPSULES BY MOUTH 3 TIMES DAILY. (Patient taking differently: Take 1,200 mg by mouth 3 (three) times daily. )  . [DISCONTINUED] LANTUS SOLOSTAR 100 UNIT/ML Solostar Pen INJECT 60 UNITS INTO THE SKIN DAILY.     Allergies:  Allergies  Allergen Reactions  . Dilaudid [Hydromorphone Hcl] Other (See Comments)  anxious  . Doxycycline Other (See Comments)    Pancreatic issues  . Metronidazole Other (See Comments)    Cream  Burned face  . Pseudoephedrine Hcl Other (See Comments)    Pt unsure     ROS:  See above HPI for pertinent positives and negatives   Objective:   Blood pressure 128/70, height 5\' 7"  (1.702 m), weight 232 lb (105.2 kg), last menstrual period 12/21/1998.  (if some vitals are omitted, this means that patient was UNABLE to obtain them even though they were asked to get them prior to OV today.  They were asked to call us at their earliest convenience with these once obtained. )  General: A & O * 3; sounds in no acute distress; in usual state of health.  Skin: Pt confirms  warm and dry extremities and pink fingertips HEENT: Pt confirms lips non-cyanotic Chest: Patient confirms normal chest excursion and movement Respiratory: speaking in full sentences, no conversational dyspnea; patient confirms no use of accessory muscles Psych: insight appears good, mood- appears full

## 2019-11-22 ENCOUNTER — Other Ambulatory Visit: Payer: Self-pay | Admitting: Family Medicine

## 2019-11-22 MED ORDER — FREESTYLE LIBRE READER DEVI: 1.0000 | Freq: Every day | 0 refills | Status: DC

## 2019-11-22 MED ORDER — FREESTYLE LIBRE 2 SENSOR SYSTM MISC: 1.0000 | Freq: Every day | 0 refills | Status: DC

## 2019-12-22 DIAGNOSIS — C50919 Malignant neoplasm of unspecified site of unspecified female breast: Secondary | ICD-10-CM

## 2019-12-22 HISTORY — PX: BREAST LUMPECTOMY: SHX2

## 2019-12-22 HISTORY — DX: Malignant neoplasm of unspecified site of unspecified female breast: C50.919

## 2020-01-01 ENCOUNTER — Ambulatory Visit: Payer: Medicare Other | Admitting: Dietician

## 2020-01-04 ENCOUNTER — Other Ambulatory Visit: Payer: Self-pay | Admitting: Family Medicine

## 2020-02-02 DIAGNOSIS — Z23 Encounter for immunization: Secondary | ICD-10-CM | POA: Diagnosis not present

## 2020-02-13 ENCOUNTER — Encounter: Payer: Self-pay | Admitting: Family Medicine

## 2020-02-20 ENCOUNTER — Other Ambulatory Visit: Payer: Self-pay | Admitting: Family Medicine

## 2020-02-20 DIAGNOSIS — E0821 Diabetes mellitus due to underlying condition with diabetic nephropathy: Secondary | ICD-10-CM

## 2020-02-20 DIAGNOSIS — Z794 Long term (current) use of insulin: Secondary | ICD-10-CM

## 2020-03-01 DIAGNOSIS — Z23 Encounter for immunization: Secondary | ICD-10-CM | POA: Diagnosis not present

## 2020-03-08 ENCOUNTER — Other Ambulatory Visit: Payer: Self-pay

## 2020-03-08 ENCOUNTER — Other Ambulatory Visit: Payer: Medicare Other

## 2020-03-08 DIAGNOSIS — I10 Essential (primary) hypertension: Secondary | ICD-10-CM | POA: Diagnosis not present

## 2020-03-08 DIAGNOSIS — E1169 Type 2 diabetes mellitus with other specified complication: Secondary | ICD-10-CM

## 2020-03-08 DIAGNOSIS — E0821 Diabetes mellitus due to underlying condition with diabetic nephropathy: Secondary | ICD-10-CM

## 2020-03-08 DIAGNOSIS — E538 Deficiency of other specified B group vitamins: Secondary | ICD-10-CM | POA: Diagnosis not present

## 2020-03-08 DIAGNOSIS — I152 Hypertension secondary to endocrine disorders: Secondary | ICD-10-CM

## 2020-03-08 DIAGNOSIS — Z794 Long term (current) use of insulin: Secondary | ICD-10-CM

## 2020-03-08 DIAGNOSIS — E531 Pyridoxine deficiency: Secondary | ICD-10-CM

## 2020-03-08 DIAGNOSIS — E1159 Type 2 diabetes mellitus with other circulatory complications: Secondary | ICD-10-CM

## 2020-03-08 DIAGNOSIS — E785 Hyperlipidemia, unspecified: Secondary | ICD-10-CM | POA: Diagnosis not present

## 2020-03-08 DIAGNOSIS — C801 Malignant (primary) neoplasm, unspecified: Secondary | ICD-10-CM

## 2020-03-09 LAB — HEMOGLOBIN A1C
Est. average glucose Bld gHb Est-mCnc: 126 mg/dL
Hgb A1c MFr Bld: 6 % — ABNORMAL HIGH (ref 4.8–5.6)

## 2020-03-14 ENCOUNTER — Ambulatory Visit (INDEPENDENT_AMBULATORY_CARE_PROVIDER_SITE_OTHER): Payer: Medicare Other | Admitting: Family Medicine

## 2020-03-14 ENCOUNTER — Other Ambulatory Visit: Payer: Self-pay

## 2020-03-14 ENCOUNTER — Encounter: Payer: Self-pay | Admitting: Family Medicine

## 2020-03-14 VITALS — BP 130/79 | HR 77 | Temp 98.0°F | Resp 12 | Ht 67.0 in | Wt 243.5 lb

## 2020-03-14 DIAGNOSIS — Z794 Long term (current) use of insulin: Secondary | ICD-10-CM | POA: Diagnosis not present

## 2020-03-14 DIAGNOSIS — E1142 Type 2 diabetes mellitus with diabetic polyneuropathy: Secondary | ICD-10-CM | POA: Diagnosis not present

## 2020-03-14 DIAGNOSIS — I10 Essential (primary) hypertension: Secondary | ICD-10-CM

## 2020-03-14 DIAGNOSIS — E785 Hyperlipidemia, unspecified: Secondary | ICD-10-CM | POA: Diagnosis not present

## 2020-03-14 DIAGNOSIS — E1159 Type 2 diabetes mellitus with other circulatory complications: Secondary | ICD-10-CM

## 2020-03-14 DIAGNOSIS — E1169 Type 2 diabetes mellitus with other specified complication: Secondary | ICD-10-CM

## 2020-03-14 DIAGNOSIS — K861 Other chronic pancreatitis: Secondary | ICD-10-CM | POA: Diagnosis not present

## 2020-03-14 DIAGNOSIS — E0821 Diabetes mellitus due to underlying condition with diabetic nephropathy: Secondary | ICD-10-CM

## 2020-03-14 DIAGNOSIS — I152 Hypertension secondary to endocrine disorders: Secondary | ICD-10-CM

## 2020-03-14 MED ORDER — INSULIN LISPRO (1 UNIT DIAL) 100 UNIT/ML (KWIKPEN): PEN_INJECTOR | SUBCUTANEOUS | 2 refills | Status: DC

## 2020-03-14 MED ORDER — LANTUS SOLOSTAR 100 UNIT/ML ~~LOC~~ SOPN: 70.0000 [IU] | PEN_INJECTOR | Freq: Every day | SUBCUTANEOUS | 2 refills | Status: DC

## 2020-03-14 NOTE — Progress Notes (Signed)
Impression and Recommendations:    1. Hyperlipidemia associated with type 2 diabetes mellitus ([place])   2. Diabetes mellitus due to underlying condition with diabetic nephropathy, with long-term current use of insulin ([place])   3. Hypertension associated with diabetes ([place])   4. Other chronic pancreatitis ([place])   5. Diabetic polyneuropathy associated with type 2 diabetes mellitus ([place])     - Reviewed recent lab work (03/08/2020) in depth with patient today.  All lab work within normal limits unless otherwise noted.  Extensive education provided and all questions answered.  LLQ Pain - Questionable Diverticulitis - Reviewed patient's concerns during appointment today.  - Advised patient to call when she is experiencing a flare, and receive a CT scan to assess for diverticulitis.  - Handout provided to patient today.  - Will continue to monitor.   B12 deficiency - B12 measured at 720 last check, stable from 712 two years prior.  - Per patient, has not received her B12 shots for two months.  - Discontinue B12 shots; was formerly managed once per month. - Encouraged patient to take B12 supplements orally.  - Re-check in 3-4 months. - Will continue to monitor.   Diabetes Mellitus w/ diabetic nephropathy w/ long-term current use of insulin - Managed on insulin (Lantus and Humalog). - A1c 6.0 six days ago, up from 5.8 prior, stable, at goal.  - Re-check A1c every 4-6 months. - Pt will continue current treatment regimen.  See med list.   - Counseled patient on pathophysiology of disease and discussed various treatment options, which always includes dietary and lifestyle modification as first line.    - Importance of low carb, heart-healthy diet discussed with patient in addition to regular aerobic exercise of 52mn 5d/week or more.   - Check FBS and 2 hours after the biggest meal of your day.  Keep log and bring in next OV for my review.   - Also told patient if you ever  feel poorly, please check your blood pressure and blood sugar, as one or the other could be the cause of your symptoms.  - Pt reminded about need for yearly eye and foot exams.  Told patient to make appt.for diabetic eye exam, CMAs here will do foot exams  - We will continue to monitor.   Hypertension associated with DM - Blood pressure currently is stable, at goal. - Patient will continue current treatment regimen.  See med list.  - Counseled patient on pathophysiology of disease and discussed various treatment options, which always includes dietary and lifestyle modification as first line.   - Lifestyle changes such as dash and heart healthy diets and engaging in a regular exercise program discussed extensively with patient.   - Ambulatory blood pressure monitoring encouraged at least 3 times weekly.  Keep log and bring in every office visit.  Reminded patient that if they ever feel poorly in any way, to check their blood pressure and pulse.  - We will continue to monitor   Hyperlipidemia associated with Type 2 DM - Statin Declined - Cholesterol levels at goal last check.  - Pt will continue current treatment regimen.  See med list.  - Prudent dietary changes such as low saturated & trans fat diets for hyperlipidemia and low carb diets for hypertriglyceridemia discussed with patient.  - Encouraged patient to follow AHA guidelines for regular exercise and also engage in weight loss if BMI above 25.   - We will continue to monitor  and re-check as discussed.   Diabetic Polyneuropathy associated with Type 2 DM - Patient continues two 600 tablets of gabapentin TID. - Per patient, symptoms do continue despite current management.  - Continue treatment plan as established.  See med list.  - Discussed options of adjusting treatment plan, such as transitioning prescription to Lyrica instead of gabapentin/neurontin, or the option of incorporating a nerve stimulator.  - Encouraged  patient to follow up with Dr. Wess Botts of Pain Medicine and ask regarding her options.  - Will continue to monitor alongside specialist as discussed.   Foot and Ankle Concerns - Reviewed patient's concerns during appointment today.  - Encouraged patient to wear shoes with adequate arch support.  - If patient is feeling unsteady on her feet, encouraged her to utilize a cane or another walking assistive device.  - Discussed option of referral to specialist for further assessment of foot and ankle concerns if desired in future.  - Will continue to monitor.   BMI Counseling - Body mass index is 38.14 kg/m Explained to patient what BMI refers to, and what it means medically.   Told patient to think about it as a "medical risk stratification measurement" and how increasing BMI is associated with increasing risk/ or worsening state of various diseases such as hypertension, hyperlipidemia, diabetes, premature OA, depression etc.  American Heart Association guidelines for healthy diet, basically Mediterranean diet, and exercise guidelines of 30 minutes 5 days per week or more discussed in detail.  - If desired, patient knows she may request a referral to specialist or [facility] for assistance with weight loss.  - Health counseling performed.  All questions answered.   Health Counseling & Preventative Maintenance - Advised patient to continue working toward exercising to improve overall mental, physical, and emotional health.    - Encouraged patient to engage in daily physical activity as tolerated, especially a formal exercise routine.  Recommended that the patient eventually strive for at least 150 minutes of moderate cardiovascular activity per week according to guidelines established by the [facility].   - Healthy dietary habits encouraged, including low-carb, and high amounts of lean protein in diet.   - Patient should also consume adequate amounts of  water.   Recommendations - Continue to follow-up every 4 months.   Meds ordered this encounter  Medications   insulin glargine (LANTUS SOLOSTAR) 100 UNIT/ML Solostar Pen    Sig: Inject 70 Units into the skin daily.    Dispense:  30 mL    Refill:  2   insulin lispro (HUMALOG KWIKPEN) 100 UNIT/ML KwikPen    Sig: INJECT 20 UNITS 3 TIMES A DAY WITH MEALS    Dispense:  15 mL    Refill:  2    Medications Discontinued During This Encounter  Medication Reason   HUMALOG KWIKPEN 100 UNIT/ML KwikPen Error   Continuous Blood Gluc Receiver (FREESTYLE LIBRE READER) DEVI Error   Continuous Blood Gluc Sensor (FREESTYLE LIBRE 2 SENSOR SYSTM) MISC Error   insulin lispro (HUMALOG KWIKPEN) 100 UNIT/ML KwikPen Reorder   LANTUS SOLOSTAR 100 UNIT/ML Solostar Pen Reorder      Please see AVS handed out to patient at the end of our visit for further patient instructions/ counseling done pertaining to today's office visit.   Return for f/up for chronic care, DM, HTN, HLD every 3-4 months, repeat B12 level after d/c.     Note:  This note was prepared with assistance of Dragon voice recognition software. Occasional wrong-word or sound-a-like  substitutions may have occurred due to the inherent limitations of voice recognition software.   The [place] was signed into law in 2016 which includes the topic of electronic health records.  This provides immediate access to information in MyChart.  This includes consultation notes, operative notes, office notes, lab results and pathology reports.  If you have any questions about what you read please let us know at your next visit or call us at the office.  We are right here with you.   This case required medical decision making of at least moderate complexity.  This document serves as a record of services personally performed by [name], DO. It was created on her behalf by Toni Amend, a trained medical scribe. The creation  of this record is based on the scribe's personal observations and the provider's statements to them.   The above documentation from Toni Amend, medical scribe, has been reviewed by Marjory Sneddon, D.O.   --------------------------------------------------------------------------------------------------------------------------------------------------------------------------------------------------------------------------------------------    Subjective:     Jasmine Davila, am serving as scribe for Dr.Opalski.   HPI: Jasmine Davila is a 69 y.o. female who presents to [place] [facility] today for issues as discussed below.  Today feels she's "doing pretty good."  Notes her husband was in the hospital over her birthday, and indicates that the experience they went through while recovering from his hospitalization was very stressful.  - B12, managed on injections She hasn't had a shot in two months because her husband has been sick, and he typically helps administer these shots.  - Diabetic Neuropathy Feels the 600 mg tablet dose of Neurontin is "easier to fill and everything else."    She takes two 600 mg tablets TID.    She did follow up with Dr. Wess Botts of Pain Medicine in January of 2020.  She was provided hydrocodone for her pain, but notes it sometimes upsets her stomach, and "doesn't do enough good to bother with it."  - Foot and Ankle Concerns Says "my feet are horrible; it feels like my ankles are swollen, and when I walk, even when I get out of bed, it's difficult because of the ankles, there's so much pain or whatever in there that it is difficult ... sometimes I find myself catching myself before I do fall."  She believes her concerns are related more to nerve than bone.  Notes in the past, she followed up with podiatry and he couldn't do much to help her, "and I know people took pictures of my ankle, but there was nothing  [found]."  Says "most times, it feels like I'm walking on marbles," and the ankles are the main thing that bother her.  - LLQ Abdominal Pains Has had pains in her left lower abdomen, on and off for the last three months.  She isn't sure what makes the pain come on; states "all of a sudden it's just there."  She had a colonoscopy in November, and was told that she has diverticula.  Says "I knew it wasn't the pancreatitis."  She hasn't noticed any fevers associated with the pain.  HPI:   Diabetes Mellitus:  Home glucose readings:  Says "for me, low is 85."  She can tell she's having a low because she feels bad.  Says sometimes she even experiences problems with her eyes, "stuff like that, but then I go and run and get my stock of chocolate."  Notes no trouble with the insulin  management.   - Patient reports good compliance with therapy plan: medication and/or lifestyle modification  - Her denies acute concerns or problems related to treatment plan  - She denies new concerns.  Denies polyuria/polydipsia, hypo/ hyperglycemia symptoms.  Denies new onset of: chest pain, exercise intolerance, shortness of breath, dizziness, visual changes, headache, lower extremity swelling or claudication.   Last A1C in the office was:  Lab Results  Component Value Date   HGBA1C 6.0 (H) 03/08/2020   HGBA1C 5.8 (H) 06/22/2019   HGBA1C 6.9 (H) 02/07/2019   Lab Results  Component Value Date   MICROALBUR 10 03/04/2017   LDLCALC 60 06/22/2019   CREATININE 0.77 03/08/2020   BP Readings from Last 3 Encounters:  03/14/20 130/79  11/20/19 128/70  07/03/19 123/73   Wt Readings from Last 3 Encounters:  03/14/20 243 lb 8 oz (110.5 kg)  11/20/19 232 lb (105.2 kg)  07/03/19 232 lb 3.2 oz (105.3 kg)   HPI:  Hypertension:  -  Her blood pressure at home has been running: 120's, 130's.  "They've been good, that hasn't been a problem."  - Patient reports good compliance with medication and/or lifestyle  modification  - Her denies acute concerns or problems related to treatment plan  - She denies new onset of: chest pain, exercise intolerance, shortness of breath, dizziness, visual changes, headache, lower extremity swelling or claudication.   Last 3 blood pressure readings in our office are as follows: BP Readings from Last 3 Encounters:  03/14/20 130/79  11/20/19 128/70  07/03/19 123/73   Filed Weights   03/14/20 1407  Weight: 243 lb 8 oz (110.5 kg)    HPI:  Hyperlipidemia:  69 y.o. female here for cholesterol follow-up.   - Patient reports good compliance with treatment plan of:  medication and/ or lifestyle management.    - Patient denies any acute concerns or problems with management plan   - She denies new onset of: myalgias, arthralgias, increased fatigue more than normal, chest pains, exercise intolerance, shortness of breath, dizziness, visual changes, headache, lower extremity swelling or claudication.   Most recent cholesterol panel was:  Lab Results  Component Value Date   CHOL 113 06/22/2019   HDL 38 (L) 06/22/2019   LDLCALC 60 06/22/2019   TRIG 75 06/22/2019   CHOLHDL 3.0 06/22/2019   Hepatic Function Latest Ref Rng & Units 03/08/2020 06/23/2019 06/22/2019  Total Protein 6.0 - 8.5 g/dL 7.3 5.9(L) 6.0(L)  Albumin 3.8 - 4.8 g/dL 4.5 3.2(L) 3.2(L)  AST 0 - 40 IU/L 25 58(H) 128(H)  ALT 0 - 32 IU/L 23 113(H) 186(H)  Alk Phosphatase 39 - 117 IU/L 93 149(H) 186(H)  Total Bilirubin 0.0 - 1.2 mg/dL 0.5 0.6 0.8  Bilirubin, Direct 0.1 - 0.5 mg/dL - - -      Wt Readings from Last 3 Encounters:  03/14/20 243 lb 8 oz (110.5 kg)  11/20/19 232 lb (105.2 kg)  07/03/19 232 lb 3.2 oz (105.3 kg)   BP Readings from Last 3 Encounters:  03/14/20 130/79  11/20/19 128/70  07/03/19 123/73   Pulse Readings from Last 3 Encounters:  03/14/20 77  07/03/19 66  06/23/19 68   BMI Readings from Last 3 Encounters:  03/14/20 38.14 kg/m  11/20/19 36.34 kg/m  07/03/19 36.37  kg/m     Patient Care Team    Relationship Specialty Notifications Start End  [name], DO PCP - General Family Medicine  11/24/16   [name], MD Consulting Physician Oncology  11/24/16   Clarene Essex, MD Consulting Physician Gastroenterology  04/20/18   Alda Berthold, DO Consulting Physician Neurology  04/20/18   Edrick Kins, DPM Consulting Physician Podiatry  04/20/18   [facility], [place] [facility]  Family Medicine  03/22/19   Arta Silence, MD Consulting Physician Gastroenterology  04/11/19   Holly Bodily, MD Referring Physician Gastroenterology  04/11/19    Comment: Specializing in pancreatitis- [facility]     Patient Active Problem List   Diagnosis Date Noted   Diabetes mellitus due to underlying condition with diabetic nephropathy, with long-term current use of insulin ([place]) 11/25/2016   Hypertension associated with diabetes ([place]) 11/24/2016   H/o Cancer:   B- cell lymphoma (spleen and lung) - non-Hodgkin's 11/24/2016   Diabetic neuropathy - exaccerbated by chemo 11/24/2016   Hyperlipidemia associated with type 2 diabetes mellitus ([place]) 11/24/2016   Endometrial disorder- trace endometrial canal fluid seen on CT scan recently and in 2016.  No GYN care many yrs, to GYN for TVUS and further w/up prn 04/20/2018   Morbid obesity ([place]) 11/25/2016   Smoking hx 11/24/2016   Chronic pancreatitis ([place]) 11/24/2016   Vitamin B6 deficiency (non anemic) 11/24/2016   GERD (gastroesophageal reflux disease) 11/24/2016   B12 deficiency 11/24/2016   Acute pancreatitis 06/21/2019   Pancreatitis 06/21/2019   Acute diverticulitis 06/21/2019   Abnormal LFTs 06/21/2019   Hyperbilirubinemia 06/21/2019   Left leg swelling 06/21/2019   Hyponatremia 06/21/2019   Hypochloremia 06/21/2019   Obesity 06/21/2019   Vitamin D insufficiency 04/11/2019   Lumbar spondylosis 02/28/2019   Chronic back pain 02/07/2019   Inflamed acrochordon 02/07/2019    Acrochordon- over R eye interferring with vision 02/07/2019   Acute otitis media 11/23/2018   Acute maxillary sinusitis 11/23/2018   History of malignant lymphoma 06/03/2018   Neuropathy 06/03/2018   Diabetic peripheral neuropathy associated with type 2 diabetes mellitus ([place]) 04/20/2018   Acute on chronic pancreatitis ([place]) 04/13/2018   Onychomycosis 03/16/2018   Neuropathic pain syndrome (non-herpetic) 03/16/2018   Sleep apnea with CPAP 12/29/2016   History of decreased platelet count 12/29/2016    Past Medical history, Surgical history, Family history, Social history, Allergies and Medications have been entered into the medical record, reviewed and changed as needed.    Current Meds  Medication Sig   aspirin EC 81 MG tablet Take 81 mg by mouth at bedtime.    Cholecalciferol (VITAMIN D-3) 5000 units TABS Take 1 tablet by mouth daily.   cyanocobalamin (,VITAMIN B-12,) 1000 MCG/ML injection Inject 0.5 mLs (500 mcg total) into the muscle every 30 (thirty) days.   famotidine (PEPCID) 20 MG tablet TAKE 1 TABLET BY MOUTH 2 TIMES DAILY.   fluticasone (FLONASE) 50 MCG/ACT nasal spray PLACE 1 SPRAY INTO BOTH NOSTRILS DAILY AS NEEDED FOR ALLERGIES OR RHINITIS.   folic acid (FOLVITE) 395 MCG tablet Take 400 mcg by mouth daily.   gabapentin (NEURONTIN) 600 MG tablet Two tabs PO TID   HYDROcodone-acetaminophen (NORCO/VICODIN) 5-325 MG tablet Take 1 tablet by mouth every 6 (six) hours as needed for moderate pain.   insulin glargine (LANTUS SOLOSTAR) 100 UNIT/ML Solostar Pen Inject 70 Units into the skin daily.   insulin lispro (HUMALOG KWIKPEN) 100 UNIT/ML KwikPen INJECT 20 UNITS 3 TIMES A DAY WITH MEALS   losartan (COZAAR) 25 MG tablet TAKE 1 TABLET BY MOUTH DAILY.   Multiple Vitamin (MULTIVITAMIN) tablet Take 1 tablet by mouth daily.   niacin (NIASPAN) 1000 MG CR tablet Take 1 tablet (1,000  mg total) by mouth at bedtime. An hour after 81 mg ASA   ondansetron (ZOFRAN ODT)  4 MG disintegrating tablet Take 1 tablet (4 mg total) by mouth every 8 (eight) hours as needed for nausea or vomiting.   ONE TOUCH ULTRA TEST test strip USE 1 STRIP TO CHECK GLUCOSE THREE TIMES DAILY AS NEEDED   [DISCONTINUED] insulin lispro (HUMALOG KWIKPEN) 100 UNIT/ML KwikPen INJECT 20 UNITS 3 TIMES A DAY WITH MEALS   [DISCONTINUED] LANTUS SOLOSTAR 100 UNIT/ML Solostar Pen INJECT 70 UNITS INTO THE SKIN DAILY.    Allergies:  Allergies  Allergen Reactions   Dilaudid [Hydromorphone Hcl] Other (See Comments)    anxious   Doxycycline Other (See Comments)    Pancreatic issues   Metronidazole Other (See Comments)    Cream  Burned face   Pseudoephedrine Hcl Other (See Comments)    Pt unsure     Review of Systems:  A fourteen system review of systems was performed and found to be positive as per HPI.   Objective:   Blood pressure 130/79, pulse 77, temperature 98 F (36.7 C), temperature source Oral, resp. rate 12, height 5' 7" (1.702 m), weight 243 lb 8 oz (110.5 kg), last menstrual period 12/21/1998, SpO2 98 %. Body mass index is 38.14 kg/m. General:  Well Developed, well nourished, appropriate for stated age.  Neuro:  Alert and oriented,  extra-ocular muscles intact  HEENT:  Normocephalic, atraumatic, neck supple, no carotid bruits appreciated  Skin:  no gross rash, warm, pink. Cardiac:  RRR, S1 S2 Respiratory:  ECTA B/L and A/P, Not using accessory muscles, speaking in full sentences- unlabored. Vascular:  Ext warm, no cyanosis apprec.; cap RF less 2 sec. Psych:  No HI/SI, judgement and insight good, Euthymic mood. Full Affect. Abdomen: obese abdomen with no acute findings; no G/R/R, soft, not bloated, subjectively tender to deep palpation in LLQ

## 2020-03-14 NOTE — Patient Instructions (Addendum)
For foot support, look into [employer] sandals and [employer] sandals.     Diverticulitis  Diverticulitis is infection or inflammation of small pouches (diverticula) in the colon that form due to a condition called diverticulosis. Diverticula can trap stool (feces) and bacteria, causing infection and inflammation. Diverticulitis may cause severe stomach pain and diarrhea. It may lead to tissue damage in the colon that causes bleeding. The diverticula may also burst (rupture) and cause infected stool to enter other areas of the abdomen. Complications of diverticulitis can include:  Bleeding.  Severe infection.  Severe pain.  Rupture (perforation) of the colon.  Blockage (obstruction) of the colon. What are the causes? This condition is caused by stool becoming trapped in the diverticula, which allows bacteria to grow in the diverticula. This leads to inflammation and infection. What increases the risk? You are more likely to develop this condition if:  You have diverticulosis. The risk for diverticulosis increases if: ? You are overweight or obese. ? You use tobacco products. ? You do not get enough exercise.  You eat a diet that does not include enough fiber. High-fiber foods include fruits, vegetables, beans, nuts, and whole grains. What are the signs or symptoms? Symptoms of this condition may include:  Pain and tenderness in the abdomen. The pain is normally located on the left side of the abdomen, but it may occur in other areas.  Fever and chills.  Bloating.  Cramping.  Nausea.  Vomiting.  Changes in bowel routines.  Blood in your stool. How is this diagnosed? This condition is diagnosed based on:  Your medical history.  A physical exam.  Tests to make sure there is nothing else causing your condition. These tests may include: ? Blood tests. ? Urine tests. ? Imaging tests of the abdomen, including X-rays, ultrasounds, MRIs, or CT scans. How is this  treated? Most cases of this condition are mild and can be treated at home. Treatment may include:  Taking over-the-counter pain medicines.  Following a clear liquid diet.  Taking antibiotic medicines by mouth.  Rest. More severe cases may need to be treated at a hospital. Treatment may include:  Not eating or drinking.  Taking prescription pain medicine.  Receiving antibiotic medicines through an IV tube.  Receiving fluids and nutrition through an IV tube.  Surgery. When your condition is under control, your health care provider may recommend that you have a colonoscopy. This is an exam to look at the entire large intestine. During the exam, a lubricated, bendable tube is inserted into the anus and then passed into the rectum, colon, and other parts of the large intestine. A colonoscopy can show how severe your diverticula are and whether something else may be causing your symptoms. Follow these instructions at home: Medicines  Take over-the-counter and prescription medicines only as told by your health care provider. These include fiber supplements, probiotics, and stool softeners.  If you were prescribed an antibiotic medicine, take it as told by your health care provider. Do not stop taking the antibiotic even if you start to feel better.  Do not drive or use heavy machinery while taking prescription pain medicine.    General instructions   Follow a full liquid diet or another diet as directed by your health care provider. After your symptoms improve, your health care provider may tell you to change your diet. He or she may recommend that you eat a diet that contains at least 25 g (25 grams) of fiber daily. Fiber makes  it easier to pass stool. Healthy sources of fiber include: ? Berries. One cup contains 4-8 grams of fiber. ? Beans or lentils. One half cup contains 5-8 grams of fiber. ? Green vegetables. One cup contains 4 grams of fiber.  Exercise for at least 30 minutes,  3 times each week. You should exercise hard enough to raise your heart rate and break a sweat.  Keep all follow-up visits as told by your health care provider. This is important. You may need a colonoscopy.   Contact a health care provider if:  Your pain does not improve.  You have a hard time drinking or eating food.  Your bowel movements do not return to normal.   Get help right away if:  Your pain gets worse.  Your symptoms do not get better with treatment.  Your symptoms suddenly get worse.  You have a fever.  You vomit more than one time.  You have stools that are bloody, black, or tarry.   Summary  Diverticulitis is infection or inflammation of small pouches (diverticula) in the colon that form due to a condition called diverticulosis. Diverticula can trap stool (feces) and bacteria, causing infection and inflammation.  You are at higher risk for this condition if you have diverticulosis and you eat a diet that does not include enough fiber.  Most cases of this condition are mild and can be treated at home. More severe cases may need to be treated at a hospital.  When your condition is under control, your health care provider may recommend that you have an exam called a colonoscopy. This exam can show how severe your diverticula are and whether something else may be causing your symptoms.   This information is not intended to replace advice given to you by your health care provider. Make sure you discuss any questions you have with your health care provider. Document Revised: 11/19/2017 Document Reviewed: 01/09/2017 Elsevier Patient Education  2020 [employer].

## 2020-03-15 LAB — COMPREHENSIVE METABOLIC PANEL
ALT: 23 IU/L (ref 0–32)
AST: 25 IU/L (ref 0–40)
Albumin/Globulin Ratio: 1.6 (ref 1.2–2.2)
Albumin: 4.5 g/dL (ref 3.8–4.8)
Alkaline Phosphatase: 93 IU/L (ref 39–117)
BUN/Creatinine Ratio: 12 (ref 12–28)
BUN: 9 mg/dL (ref 8–27)
Bilirubin Total: 0.5 mg/dL (ref 0.0–1.2)
CO2: 24 mmol/L (ref 20–29)
Calcium: 10 mg/dL (ref 8.7–10.3)
Chloride: 101 mmol/L (ref 96–106)
Creatinine, Ser: 0.77 mg/dL (ref 0.57–1.00)
GFR calc Af Amer: 91 mL/min/{1.73_m2} (ref >59)
GFR calc non Af Amer: 79 mL/min/{1.73_m2} (ref >59)
Globulin, Total: 2.8 g/dL (ref 1.5–4.5)
Glucose: 117 mg/dL — ABNORMAL HIGH (ref 65–99)
Potassium: 4.5 mmol/L (ref 3.5–5.2)
Sodium: 142 mmol/L (ref 134–144)
Total Protein: 7.3 g/dL (ref 6.0–8.5)

## 2020-03-15 LAB — CBC WITH DIFFERENTIAL/PLATELET
Basophils Absolute: 0 10*3/uL (ref 0.0–0.2)
Basos: 1 %
EOS (ABSOLUTE): 0.1 10*3/uL (ref 0.0–0.4)
Eos: 2 %
Hematocrit: 40.9 % (ref 34.0–46.6)
Hemoglobin: 13.8 g/dL (ref 11.1–15.9)
Immature Grans (Abs): 0 10*3/uL (ref 0.0–0.1)
Immature Granulocytes: 0 %
Lymphocytes Absolute: 1.9 10*3/uL (ref 0.7–3.1)
Lymphs: 34 %
MCH: 29.5 pg (ref 26.6–33.0)
MCHC: 33.7 g/dL (ref 31.5–35.7)
MCV: 87 fL (ref 79–97)
Monocytes Absolute: 0.3 10*3/uL (ref 0.1–0.9)
Monocytes: 6 %
Neutrophils Absolute: 3.2 10*3/uL (ref 1.4–7.0)
Neutrophils: 57 %
Platelets: 164 10*3/uL (ref 150–450)
RBC: 4.68 x10E6/uL (ref 3.77–5.28)
RDW: 12.9 % (ref 11.7–15.4)
WBC: 5.7 10*3/uL (ref 3.4–10.8)

## 2020-03-15 LAB — VITAMIN B12: Vitamin B-12: 720 pg/mL (ref 232–1245)

## 2020-03-15 LAB — VITAMIN B6: Vitamin B6: 29.7 ug/L (ref 2.0–32.8)

## 2020-03-19 ENCOUNTER — Encounter: Payer: Self-pay | Admitting: Family Medicine

## 2020-03-27 DIAGNOSIS — H52203 Unspecified astigmatism, bilateral: Secondary | ICD-10-CM | POA: Diagnosis not present

## 2020-03-27 DIAGNOSIS — H5203 Hypermetropia, bilateral: Secondary | ICD-10-CM | POA: Diagnosis not present

## 2020-03-27 DIAGNOSIS — E119 Type 2 diabetes mellitus without complications: Secondary | ICD-10-CM | POA: Diagnosis not present

## 2020-03-27 LAB — HM DIABETES EYE EXAM

## 2020-04-03 ENCOUNTER — Other Ambulatory Visit: Payer: Self-pay | Admitting: Family Medicine

## 2020-04-10 DIAGNOSIS — Z1231 Encounter for screening mammogram for malignant neoplasm of breast: Secondary | ICD-10-CM | POA: Diagnosis not present

## 2020-04-10 LAB — HM MAMMOGRAPHY

## 2020-04-15 ENCOUNTER — Other Ambulatory Visit: Payer: Self-pay | Admitting: Family Medicine

## 2020-04-15 DIAGNOSIS — R928 Other abnormal and inconclusive findings on diagnostic imaging of breast: Secondary | ICD-10-CM

## 2020-04-15 NOTE — Progress Notes (Signed)
Diagnostic mammogram ordered per written order on mammogram from 04/10/20. See abstractions. AS, CMA

## 2020-04-19 ENCOUNTER — Telehealth: Payer: Self-pay | Admitting: Family Medicine

## 2020-04-19 NOTE — Telephone Encounter (Signed)
Patient left msg while office clsd for lunch request call from med asst in regards to any further instructions frm Dr.O .  --Forwarding message to med asst to call [phone].  --glh

## 2020-04-22 IMAGING — CR ABDOMEN - 1 VIEW
2 series · 2 of 2 positions shown · non-contrast
Comparison: 06/21/2019

CLINICAL DATA: Assessed pancreatic duct stent placement

EXAM:
ABDOMEN - 1 VIEW

[t abdomen supine (1 of 2)]
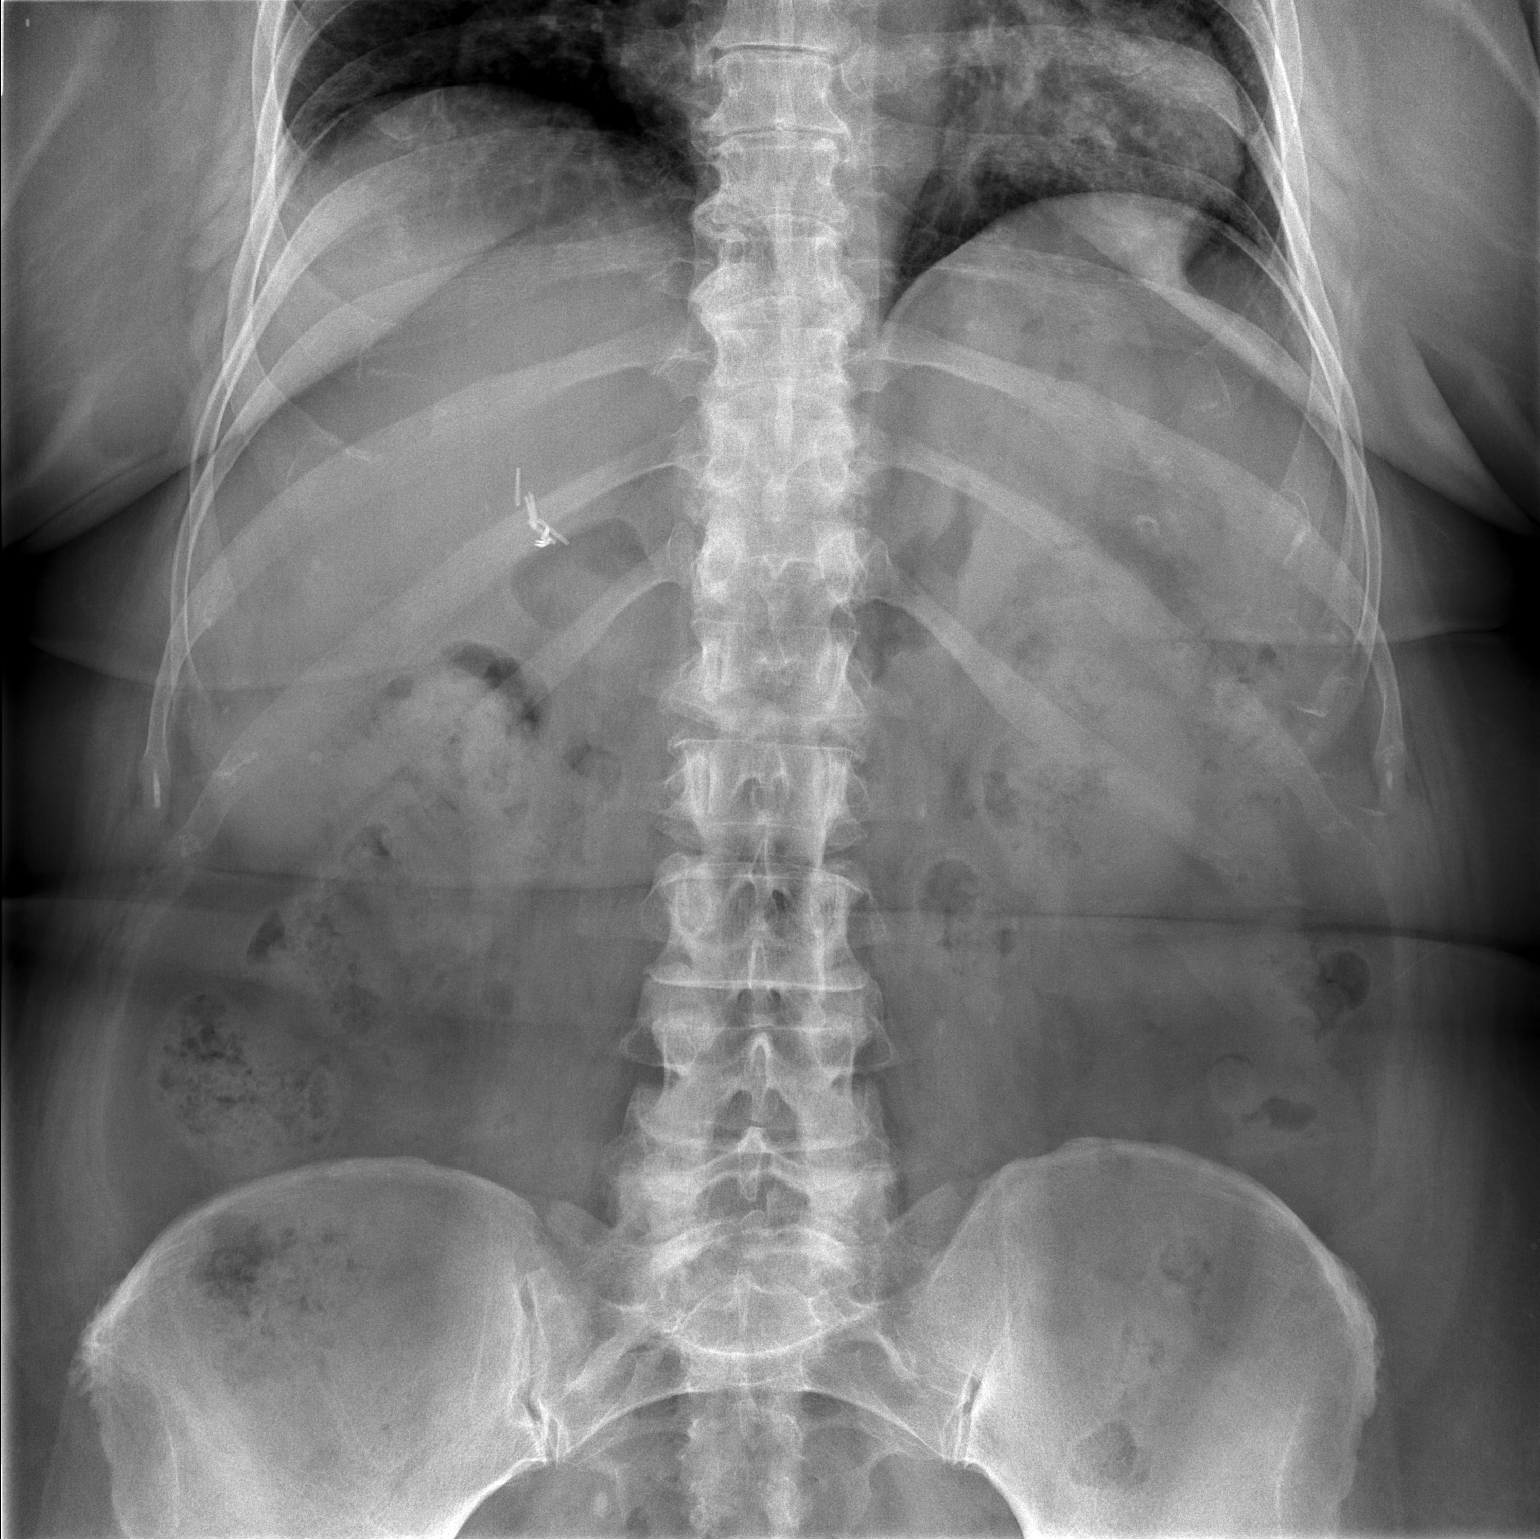

[t abdomen supine (2 of 2)]
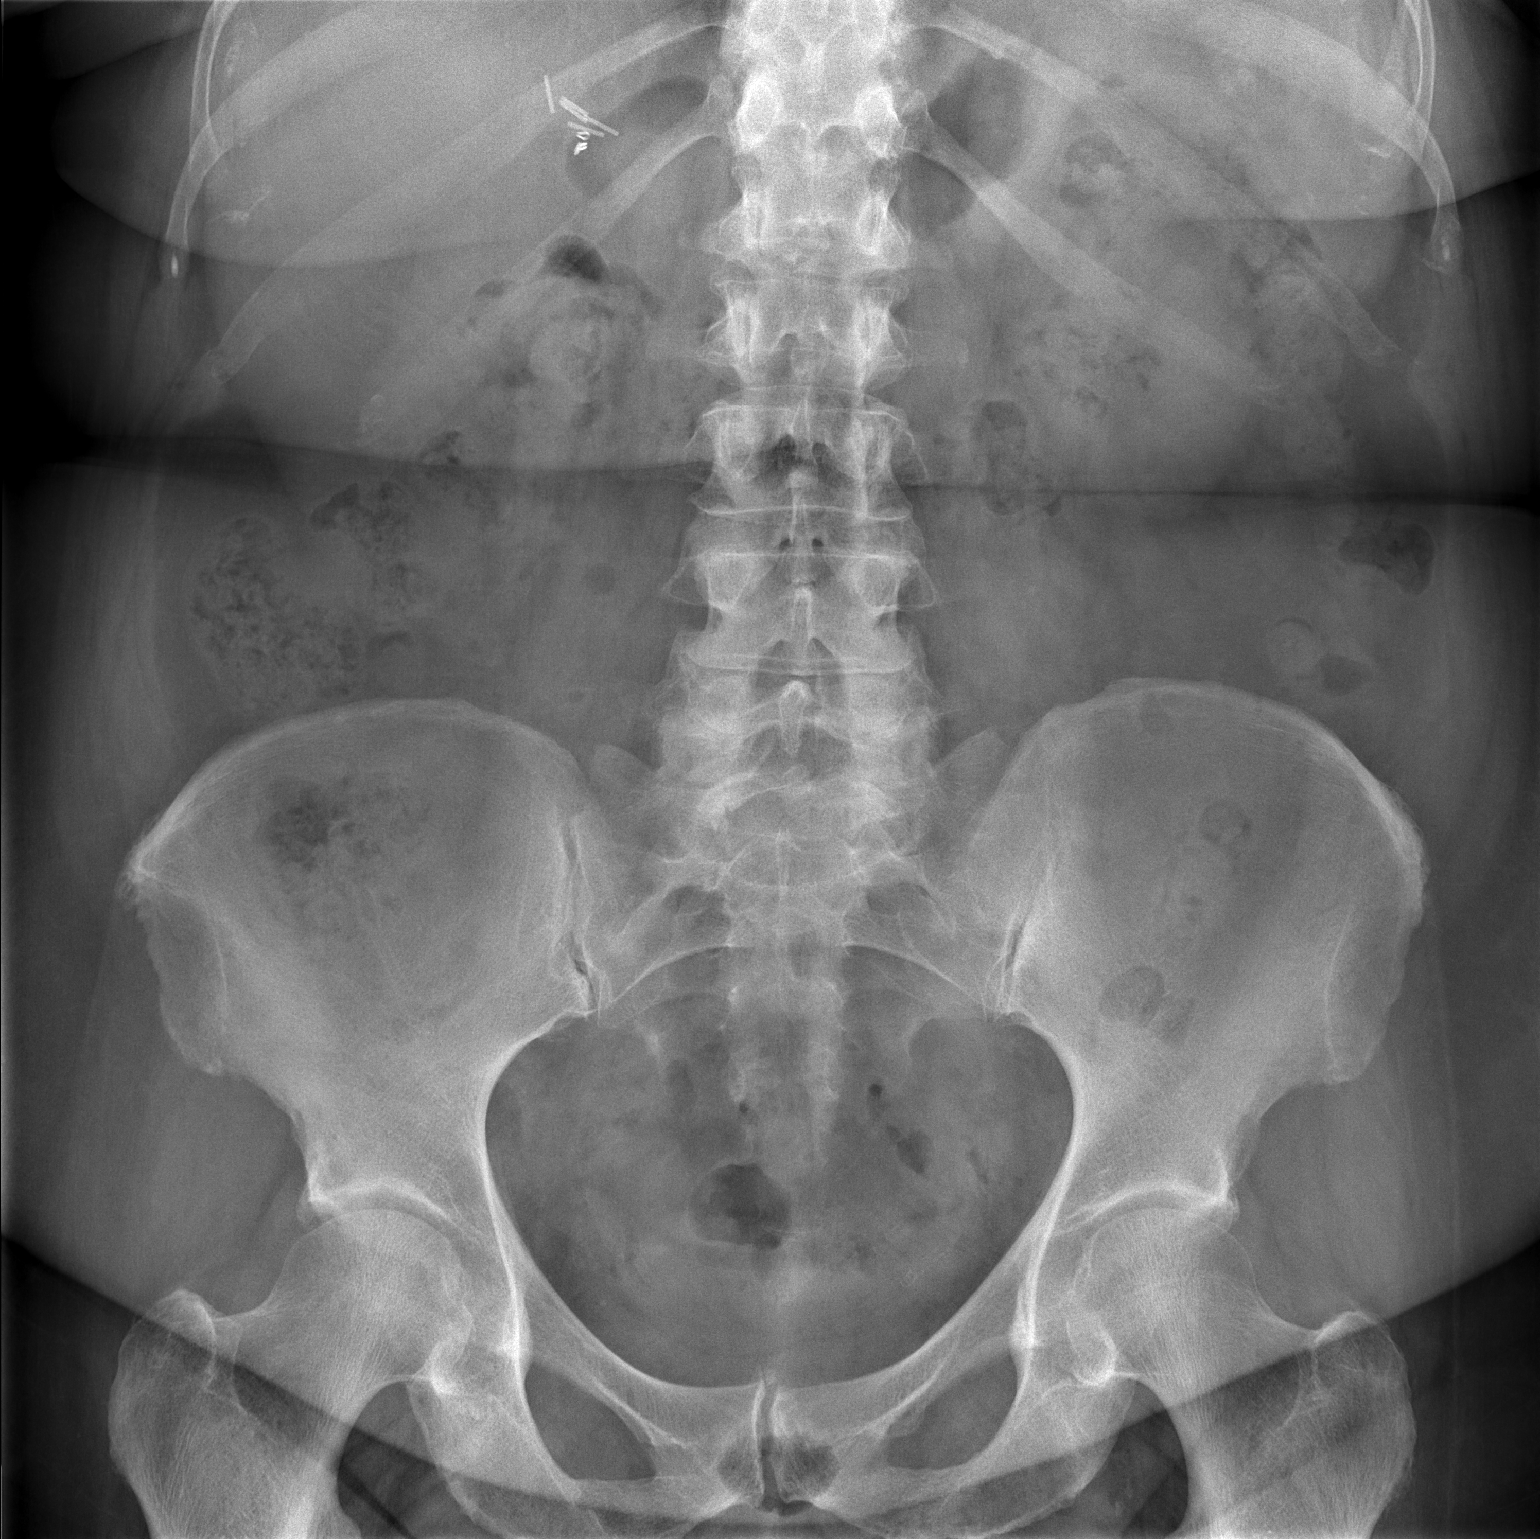

[2 of 2 positions shown; findings below may reference images not displayed]

FINDINGS: Changes consistent with prior cholecystectomy are noted. The
previously seen pancreatic duct stent is no longer identified and
has likely passed. No bony abnormality is seen. No soft tissue
abnormality is noted.
IMPRESSION: Previously seen pancreatic duct stent is not visualized on today's
exam.

## 2020-04-22 NOTE — Telephone Encounter (Signed)
Patient calling to see whats going on with referral to [facility] for Diagnostic mammogram.   Referral ordered 04/15/2020. Can you please look at this for me and advise?   Thank you  Skyleen Bentley, Patrecia Pace, [place]  Certified Medical Assistant    Progress Notes      Signed  Encounter Date:  04/15/2020          Signed         Show:Clear all [x] Manual[] Template[] Copied  Added by: [x] Nickie Deren, Patrecia Pace, CMA  [] Hover for details Diagnostic mammogram ordered per written order on mammogram from 04/10/20. See abstractions. AS, CMA

## 2020-05-01 ENCOUNTER — Ambulatory Visit: Payer: Medicare Other

## 2020-05-01 ENCOUNTER — Encounter: Payer: Self-pay | Admitting: Family Medicine

## 2020-05-01 ENCOUNTER — Other Ambulatory Visit: Payer: Self-pay

## 2020-05-01 ENCOUNTER — Telehealth: Payer: Self-pay | Admitting: Physician Assistant

## 2020-05-01 DIAGNOSIS — R928 Other abnormal and inconclusive findings on diagnostic imaging of breast: Secondary | ICD-10-CM

## 2020-05-01 DIAGNOSIS — R9431 Abnormal electrocardiogram [ECG] [EKG]: Secondary | ICD-10-CM

## 2020-05-01 DIAGNOSIS — R921 Mammographic calcification found on diagnostic imaging of breast: Secondary | ICD-10-CM | POA: Diagnosis not present

## 2020-05-01 NOTE — Telephone Encounter (Signed)
Courtney with [facility] calling requesting order for [facility] Biopsy for R breast to be faxed to [phone] Attn: Mammo Dept.    Per Lorrene Reid, PA ok for order.   Order being placed and faxed to [facility] as requested. AS, CMA

## 2020-05-01 NOTE — Addendum Note (Signed)
Addended by: Mickel Crow on: 05/01/2020 11:15 AM   Modules accepted: Orders

## 2020-05-06 ENCOUNTER — Encounter: Payer: Self-pay | Admitting: Family Medicine

## 2020-05-06 DIAGNOSIS — R921 Mammographic calcification found on diagnostic imaging of breast: Secondary | ICD-10-CM | POA: Diagnosis not present

## 2020-05-06 DIAGNOSIS — D0511 Intraductal carcinoma in situ of right breast: Secondary | ICD-10-CM | POA: Diagnosis not present

## 2020-05-08 DIAGNOSIS — D0511 Intraductal carcinoma in situ of right breast: Secondary | ICD-10-CM | POA: Diagnosis not present

## 2020-05-14 ENCOUNTER — Telehealth: Payer: Self-pay | Admitting: Physician Assistant

## 2020-05-14 DIAGNOSIS — Z794 Long term (current) use of insulin: Secondary | ICD-10-CM

## 2020-05-14 NOTE — Telephone Encounter (Signed)
Patient called states her Ins Co will " NOT " pay for the CGM glucometer but she found one something like it that they will cover, its called "The Dexcom G6 CGM System.Manage your diabetes without fingersticks.*.  --Please fax order to [employer](pt's ins co's 3rd party verification dept  [phone] to supply glucometer & supplies for testing.  pls contact pt if there are any questions or concerns ([phone].  --glh

## 2020-05-15 MED ORDER — DEXCOM G6 RECEIVER DEVI: 0 refills | Status: DC

## 2020-05-15 MED ORDER — DEXCOM G6 SENSOR MISC: 1 refills | Status: DC

## 2020-05-15 NOTE — Telephone Encounter (Signed)
RXs for supplies faxed to [facility].  T.Marisue Canion, CMA

## 2020-05-21 DIAGNOSIS — Z1152 Encounter for screening for COVID-19: Secondary | ICD-10-CM | POA: Diagnosis not present

## 2020-05-21 DIAGNOSIS — Z1159 Encounter for screening for other viral diseases: Secondary | ICD-10-CM | POA: Diagnosis not present

## 2020-05-23 ENCOUNTER — Other Ambulatory Visit: Payer: Self-pay | Admitting: Family Medicine

## 2020-05-27 DIAGNOSIS — G4733 Obstructive sleep apnea (adult) (pediatric): Secondary | ICD-10-CM | POA: Diagnosis not present

## 2020-05-27 DIAGNOSIS — F17210 Nicotine dependence, cigarettes, uncomplicated: Secondary | ICD-10-CM | POA: Diagnosis not present

## 2020-05-27 DIAGNOSIS — D0511 Intraductal carcinoma in situ of right breast: Secondary | ICD-10-CM | POA: Diagnosis not present

## 2020-05-27 DIAGNOSIS — E669 Obesity, unspecified: Secondary | ICD-10-CM | POA: Diagnosis not present

## 2020-05-27 DIAGNOSIS — R928 Other abnormal and inconclusive findings on diagnostic imaging of breast: Secondary | ICD-10-CM | POA: Diagnosis not present

## 2020-05-27 DIAGNOSIS — Z9989 Dependence on other enabling machines and devices: Secondary | ICD-10-CM | POA: Diagnosis not present

## 2020-05-27 DIAGNOSIS — I10 Essential (primary) hypertension: Secondary | ICD-10-CM | POA: Diagnosis not present

## 2020-05-27 DIAGNOSIS — M1990 Unspecified osteoarthritis, unspecified site: Secondary | ICD-10-CM | POA: Diagnosis not present

## 2020-05-27 DIAGNOSIS — E119 Type 2 diabetes mellitus without complications: Secondary | ICD-10-CM | POA: Diagnosis not present

## 2020-05-27 DIAGNOSIS — J302 Other seasonal allergic rhinitis: Secondary | ICD-10-CM | POA: Diagnosis not present

## 2020-05-27 LAB — HM MAMMOGRAPHY

## 2020-05-30 ENCOUNTER — Encounter: Payer: Self-pay | Admitting: Physician Assistant

## 2020-05-31 ENCOUNTER — Telehealth: Payer: Self-pay

## 2020-05-31 MED ORDER — DEXCOM G6 TRANSMITTER MISC: 0 refills | Status: DC

## 2020-05-31 NOTE — Telephone Encounter (Signed)
Received faxed request from [facility] requesting RX for Dexcom G6 receiver.  RX sent to pharmacy.  Charyl Bigger, CMA

## 2020-06-08 ENCOUNTER — Other Ambulatory Visit: Payer: Self-pay | Admitting: Family Medicine

## 2020-06-08 DIAGNOSIS — Z794 Long term (current) use of insulin: Secondary | ICD-10-CM

## 2020-06-10 ENCOUNTER — Other Ambulatory Visit: Payer: Self-pay | Admitting: Physician Assistant

## 2020-06-10 DIAGNOSIS — Z794 Long term (current) use of insulin: Secondary | ICD-10-CM

## 2020-06-14 ENCOUNTER — Encounter: Payer: Self-pay | Admitting: Physician Assistant

## 2020-06-14 ENCOUNTER — Ambulatory Visit (INDEPENDENT_AMBULATORY_CARE_PROVIDER_SITE_OTHER): Payer: Medicare Other | Admitting: Physician Assistant

## 2020-06-14 ENCOUNTER — Other Ambulatory Visit: Payer: Self-pay

## 2020-06-14 VITALS — BP 128/71 | HR 64 | Temp 98.1°F | Ht 67.0 in | Wt 246.8 lb

## 2020-06-14 DIAGNOSIS — I152 Hypertension secondary to endocrine disorders: Secondary | ICD-10-CM

## 2020-06-14 DIAGNOSIS — E1169 Type 2 diabetes mellitus with other specified complication: Secondary | ICD-10-CM | POA: Diagnosis not present

## 2020-06-14 DIAGNOSIS — K219 Gastro-esophageal reflux disease without esophagitis: Secondary | ICD-10-CM | POA: Diagnosis not present

## 2020-06-14 DIAGNOSIS — D0511 Intraductal carcinoma in situ of right breast: Secondary | ICD-10-CM

## 2020-06-14 DIAGNOSIS — Z794 Long term (current) use of insulin: Secondary | ICD-10-CM

## 2020-06-14 DIAGNOSIS — I10 Essential (primary) hypertension: Secondary | ICD-10-CM

## 2020-06-14 DIAGNOSIS — E1159 Type 2 diabetes mellitus with other circulatory complications: Secondary | ICD-10-CM | POA: Diagnosis not present

## 2020-06-14 DIAGNOSIS — E0821 Diabetes mellitus due to underlying condition with diabetic nephropathy: Secondary | ICD-10-CM | POA: Diagnosis not present

## 2020-06-14 DIAGNOSIS — E785 Hyperlipidemia, unspecified: Secondary | ICD-10-CM

## 2020-06-14 LAB — POCT GLYCOSYLATED HEMOGLOBIN (HGB A1C): Hemoglobin A1C: 5.9 % — AB (ref 4.0–5.6)

## 2020-06-14 MED ORDER — INSULIN LISPRO (1 UNIT DIAL) 100 UNIT/ML (KWIKPEN): PEN_INJECTOR | SUBCUTANEOUS | 2 refills | Status: DC

## 2020-06-14 MED ORDER — DEXCOM G6 TRANSMITTER MISC: 0 refills | Status: DC

## 2020-06-14 MED ORDER — DEXCOM G6 RECEIVER DEVI: 0 refills | Status: DC

## 2020-06-14 MED ORDER — FAMOTIDINE 20 MG PO TABS: 20.0000 mg | ORAL_TABLET | Freq: Two times a day (BID) | ORAL | 1 refills | Status: DC

## 2020-06-14 MED ORDER — DEXCOM G6 SENSOR MISC: 1 refills | Status: DC

## 2020-06-14 NOTE — Progress Notes (Signed)
Established Patient Office Visit  Subjective:  Patient ID: Jasmine Davila, female    DOB: Jan 02, 1951  Age: 69 y.o. MRN: [id number]  CC:  Chief Complaint  Patient presents with  . Diabetes  . Hyperlipidemia  . Hypertension    HPI Jasmine Davila presents for follow up on diabetes mellitus, hyperlipidemia, and hypertension.  Patient was recently diagnosed with ductal carcinoma in situ of the right breast and is followed by [facility] surgery- Dr. Orrin Brigham, and [place].  Patient decided to do breast conserving therapy and is waiting to find out if she will need postop radiation therapy.  Diabetes: Pt denies increased urination or thirst. Pt reports medication compliance. Reports few hypoglycemic events. Denies any syncope, falls or injury. Checking glucose at home and have been staying stable. She follows a low carbohydrate and glucose diet.  She tries to stay busy with doing yard work and activities around the house.  HLD: Pt taking medication as directed without issues. Denies side effects including myalgias and RUQ pain.  Reports she follows a diet that is low in cholesterol and saturated fats.  HTN: Pt denies chest pain, palpitations, or dizziness.  She does report some lower extremity swelling, left ankle > right ankle. Taking medication as directed without side effects. Pt follows a low salt diet.  Hepatitis C screening: Patient reports she had hepatitis C screening done in [place] about 3 to 4 years ago. Reviewed her chart and unable to locate, so she will try to request record.  Past Medical History:  Diagnosis Date  . Abnormal EKG    hx of right bundle branch block  . Acute pancreatitis    HX OF  . Anemia yrs ago  . Cancer ([place]) dx 2016   B cell lymphoma- Non Hodgkins in remission  . Diabetes mellitus ([place])    TYPE 2  . Diabetic neuropathy ([place])   . Diabetic neuropathy ([place])   . Endometrial disorder   . GERD  (gastroesophageal reflux disease)   . Headache    tension or sinus  . Hypertension   . Malignant lymphoma ([place])   . Morbid obesity ([place])   . Peripheral neuropathy    BOTH FEET, SLIGHT NEUROPATHY IN HANDS  . Sleep apnea    USES CPAP at times  . Vitamin B 12 deficiency     Past Surgical History:  Procedure Laterality Date  . CHOLECYSTECTOMY  03/06/1996  . colomscopy    . DILATATION & CURETTAGE/HYSTEROSCOPY WITH MYOSURE N/A 07/04/2018   Procedure: DILATATION & CURETTAGE/HYSTEROSCOPY WITH MYOSURE;  Surgeon: Salvadore Dom, MD;  Location: [facility];  Service: Gynecology;  Laterality: N/A;  . MEDIPORT INSERTION, DOUBLE    . MEDIPORT REMOVAL      Family History  Problem Relation Age of Onset  . Cancer Mother 85       colon  . Hypertension Mother   . Osteoporosis Mother   . Heart attack Father   . Heart disease Sister   . Alcohol abuse Maternal Grandfather     Social History   Socioeconomic History  . Marital status: Married    Spouse name: Not on file  . Number of children: 4  . Years of education: 41  . Highest education level: Not on file  Occupational History  . Occupation: retired from [employer]  Tobacco Use  . Smoking status: Former Smoker    Packs/day: 0.50    Years: 4.00    Pack  years: 2.00    Types: Cigarettes    Quit date: 12/21/1968    Years since quitting: 51.5  . Smokeless tobacco: Never Used  . Tobacco comment: as teenager  Vaping Use  . Vaping Use: Never used  Substance and Sexual Activity  . Alcohol use: No  . Drug use: No  . Sexual activity: Not Currently    Birth control/protection: None  Other Topics Concern  . Not on file  Social History Narrative   Lives with husband in a one story home.  Has 3 living children.  One child died in a MVA.  Retired from [employer].     Education: high school.   Social Determinants of Health   Financial Resource Strain:   . Difficulty of Paying Living Expenses:   Food  Insecurity:   . Worried About Charity fundraiser in the Last Year:   . Arboriculturist in the Last Year:   Transportation Needs:   . Film/video editor (Medical):   Marland Kitchen Lack of Transportation (Non-Medical):   Physical Activity:   . Days of Exercise per Week:   . Minutes of Exercise per Session:   Stress:   . Feeling of Stress :   Social Connections:   . Frequency of Communication with Friends and Family:   . Frequency of Social Gatherings with Friends and Family:   . Attends Religious Services:   . Active Member of Clubs or Organizations:   . Attends Archivist Meetings:   Marland Kitchen Marital Status:   Intimate Partner Violence:   . Fear of Current or Ex-Partner:   . Emotionally Abused:   Marland Kitchen Physically Abused:   . Sexually Abused:     Outpatient Medications Prior to Visit  Medication Sig Dispense Refill  . aspirin EC 81 MG tablet Take 81 mg by mouth at bedtime.     . Cholecalciferol (VITAMIN D-3) 5000 units TABS Take 1 tablet by mouth daily.    . fluticasone (FLONASE) 50 MCG/ACT nasal spray PLACE 1 SPRAY INTO BOTH NOSTRILS DAILY AS NEEDED FOR ALLERGIES OR RHINITIS. 16 g 5  . folic acid (FOLVITE) 616 MCG tablet Take 400 mcg by mouth daily.    . insulin glargine (LANTUS SOLOSTAR) 100 UNIT/ML Solostar Pen Inject 70 Units into the skin daily. 30 mL 2  . losartan (COZAAR) 25 MG tablet TAKE 1 TABLET BY MOUTH DAILY. 90 tablet 0  . Multiple Vitamin (MULTIVITAMIN) tablet Take 1 tablet by mouth daily.    . niacin (NIASPAN) 1000 MG CR tablet Take 1 tablet (1,000 mg total) by mouth at bedtime. An hour after 81 mg ASA 90 tablet 3  . Continuous Blood Gluc Receiver (DEXCOM G6 RECEIVER) DEVI Use to check blood sugars every morning fasting and 2 hours after largest meal 1 each 0  . Continuous Blood Gluc Sensor (DEXCOM G6 SENSOR) MISC Apply new sensor every 10 days.  Use to check blood sugars every morning fasting and 2 hours after largest meal 9 each 1  . Continuous Blood Gluc Transmit (DEXCOM  G6 TRANSMITTER) MISC Use to check blood sugars every morning fasting and 2 hours after largest meal 1 each 0  . famotidine (PEPCID) 20 MG tablet TAKE 1 TABLET BY MOUTH 2 TIMES DAILY. 180 tablet 1  . insulin lispro (HUMALOG KWIKPEN) 100 UNIT/ML KwikPen INJECT 20 UNITS 3 TIMES A DAY WITH MEALS 15 mL 2  . gabapentin (NEURONTIN) 600 MG tablet Two tabs PO TID 540 tablet 3  .  cyanocobalamin (,VITAMIN B-12,) 1000 MCG/ML injection Inject 0.5 mLs (500 mcg total) into the muscle every 30 (thirty) days. 10 mL 1  . HYDROcodone-acetaminophen (NORCO/VICODIN) 5-325 MG tablet Take 1 tablet by mouth every 6 (six) hours as needed for moderate pain.    Marland Kitchen ondansetron (ZOFRAN ODT) 4 MG disintegrating tablet Take 1 tablet (4 mg total) by mouth every 8 (eight) hours as needed for nausea or vomiting. 20 tablet 0  . ONE TOUCH ULTRA TEST test strip USE 1 STRIP TO CHECK GLUCOSE THREE TIMES DAILY AS NEEDED  5   No facility-administered medications prior to visit.    Allergies  Allergen Reactions  . Dilaudid [Hydromorphone Hcl] Other (See Comments)    anxious  . Doxycycline Other (See Comments)    Pancreatic issues  . Metronidazole Other (See Comments)    Cream  Burned face  . Pseudoephedrine Hcl Other (See Comments)    Pt unsure    ROS Review of Systems  A fourteen system review of systems was performed and found to be positive as per HPI.  Objective:    Physical Exam General: Well nourished, in no apparent distress. Eyes: PERRLA, EOMs, conjunctiva clr Resp: Respiratory effort- normal, ECTA B/L w/o W/R/R  Cardio: RRR w/o MRGs. Abdomen: no gross distention. Lymphatics:  less 2 sec cap RF, minimal LE edema M-sk: Good ROM, normal gait.  Skin: Warm, dry  Neuro: Alert, Oriented, no focal deficits  Psych: Normal affect, Insight and Judgment appropriate.   BP 128/71   Pulse 64   Temp 98.1 F (36.7 C) (Oral)   Ht 5\' 7"  (1.702 m)   Wt 246 lb 12.8 oz (111.9 kg)   LMP 12/21/1998 (Approximate)   SpO2 100%  Comment: on RA  BMI 38.65 kg/m  Wt Readings from Last 3 Encounters:  06/14/20 246 lb 12.8 oz (111.9 kg)  03/14/20 243 lb 8 oz (110.5 kg)  11/20/19 232 lb (105.2 kg)     Health Maintenance Due  Topic Date Due  . Hepatitis C Screening  Never done  . COVID-19 Vaccine (1) Never done  . TETANUS/TDAP  Never done  . PNA vac Low Risk Adult (2 of 2 - PPSV23) 09/20/2016    There are no preventive care reminders to display for this patient.  Lab Results  Component Value Date   TSH 1.152 06/22/2019   Lab Results  Component Value Date   WBC 5.7 03/08/2020   HGB 13.8 03/08/2020   HCT 40.9 03/08/2020   MCV 87 03/08/2020   PLT 164 03/08/2020   Lab Results  Component Value Date   NA 142 03/08/2020   K 4.5 03/08/2020   CO2 24 03/08/2020   GLUCOSE 117 (H) 03/08/2020   BUN 9 03/08/2020   CREATININE 0.77 03/08/2020   BILITOT 0.5 03/08/2020   ALKPHOS 93 03/08/2020   AST 25 03/08/2020   ALT 23 03/08/2020   PROT 7.3 03/08/2020   ALBUMIN 4.5 03/08/2020   CALCIUM 10.0 03/08/2020   ANIONGAP 8 06/23/2019   Lab Results  Component Value Date   CHOL 113 06/22/2019   Lab Results  Component Value Date   HDL 38 (L) 06/22/2019   Lab Results  Component Value Date   LDLCALC 60 06/22/2019   Lab Results  Component Value Date   TRIG 75 06/22/2019   Lab Results  Component Value Date   CHOLHDL 3.0 06/22/2019   Lab Results  Component Value Date   HGBA1C 5.9 (A) 06/14/2020      Assessment &  Plan:   Problem List Items Addressed This Visit      Cardiovascular and Mediastinum   Hypertension associated with diabetes ([place]) (Chronic)   Relevant Medications   insulin lispro (HUMALOG KWIKPEN) 100 UNIT/ML KwikPen     Digestive   GERD (gastroesophageal reflux disease) (Chronic)   Relevant Medications   famotidine (PEPCID) 20 MG tablet     Endocrine   Hyperlipidemia associated with type 2 diabetes mellitus ([facility]) - Primary (Chronic)   Relevant Medications   insulin lispro (HUMALOG  KWIKPEN) 100 UNIT/ML KwikPen   Diabetes mellitus due to underlying condition with diabetic nephropathy, with long-term current use of insulin ([facility]) (Chronic)   Relevant Medications   insulin lispro (HUMALOG KWIKPEN) 100 UNIT/ML KwikPen   Other Relevant Orders   POCT glycosylated hemoglobin (Hb A1C) (Completed)     Other   Ductal carcinoma in situ (DCIS) of right breast     Diabetes mellitus due to underlying condition with diabetic nephropathy with long-term current use of insulin: -A1c today is 5.9, at goal -Discussed with patient if A1c continues to be at goal or decreases even further will make medication adjustments to prevent frequent hypoglycemia events.  Patient verbalized understanding. -Continue Lantus and Humalog. -Continue ambulatory glucose monitoring with Dexcom sensor, notify the clinic if FBS consistently <80 or > 160. -Continue low carbohydrate and glucose diet. -Continue to stay as active as possible. -Patient unable to provide urine sample for UA microalbumin. Will try to collect at next office visit.  Hypertension associated with diabetes: -BP today is 128/71 HR 64, stable and at goal -Continue losartan 25 mg. -Continue Dash diet. -Stay well hydrated, at least 64 fl oz -Most recent CMP WNL  Hyperlipidemia associated with type 2 diabetes mellitus: -Last lipid panel wnl's with exception of borderline low HDL, LDL 60 -Continue niacin 1000 mg. -Continue heart healthy diet. -Will continue to monitor.  GERD: -Stable -Continue famotidine 20 mg twice daily. Provided refills -Recommend to reduce provocative food triggers.  Ductal carcinoma in situ of right breast: -Followed by [place] [facility] and [place] her oncologist does routine labs, so at her next follow-up visit will determine which labs will be needed to avoid repeating the same lab.   Meds ordered this encounter  Medications  . DISCONTD: Continuous Blood Gluc  Receiver ([place]) DEVI    Sig: Use to check blood sugars every morning fasting and 2 hours after largest meal    Dispense:  1 each    Refill:  0  . DISCONTD: Continuous Blood Gluc Transmit (DEXCOM G6 TRANSMITTER) MISC    Sig: Use to check blood sugars every morning fasting and 2 hours after largest meal    Dispense:  1 each    Refill:  0  . DISCONTD: Continuous Blood Gluc Sensor (DEXCOM G6 SENSOR) MISC    Sig: Apply new sensor every 10 days.  Use to check blood sugars every morning fasting and 2 hours after largest meal    Dispense:  9 each    Refill:  1  . famotidine (PEPCID) 20 MG tablet    Sig: Take 1 tablet (20 mg total) by mouth 2 (two) times daily.    Dispense:  180 tablet    Refill:  1  . insulin lispro (HUMALOG KWIKPEN) 100 UNIT/ML KwikPen    Sig: INJECT 20 UNITS 3 TIMES A DAY WITH MEALS    Dispense:  15 mL    Refill:  2    Follow-up:  Return for DM, HTN, HLD, Vit b12 def and FBW (lipid panel, a1c, vit b12/b6).    Lorrene Reid, PA-C

## 2020-06-14 NOTE — Patient Instructions (Signed)

## 2020-06-17 ENCOUNTER — Other Ambulatory Visit: Payer: Self-pay | Admitting: Physician Assistant

## 2020-06-17 DIAGNOSIS — D0511 Intraductal carcinoma in situ of right breast: Secondary | ICD-10-CM | POA: Diagnosis not present

## 2020-06-17 DIAGNOSIS — C8339 Diffuse large B-cell lymphoma, extranodal and solid organ sites: Secondary | ICD-10-CM | POA: Diagnosis not present

## 2020-06-18 ENCOUNTER — Telehealth: Payer: Self-pay | Admitting: Physician Assistant

## 2020-06-18 DIAGNOSIS — Z794 Long term (current) use of insulin: Secondary | ICD-10-CM

## 2020-06-18 MED ORDER — DEXCOM G6 TRANSMITTER MISC: 0 refills | Status: DC

## 2020-06-18 MED ORDER — DEXCOM G6 RECEIVER DEVI: 0 refills | Status: DC

## 2020-06-18 MED ORDER — DEXCOM G6 SENSOR MISC: 1 refills | Status: DC

## 2020-06-18 NOTE — Telephone Encounter (Signed)
RXs for testing supplies printed and faxed to [facility].  T. Tonishia Steffy, CMA

## 2020-06-18 NOTE — Telephone Encounter (Signed)
Patient called states Rxs were rejected by pharmacy for refill due to Missing NPI infor & Dx code says they will fill Rxs if resubmitted w/ corrected information they can be reached @ [phone].  --forwarding request to med asst to resubmitt  Outpatient Medication Detail   Disp Refills Start End   Continuous Blood Gluc Sensor (DEXCOM G6 SENSOR) MISC 9 each 1 06/14/2020    Sig: Apply new sensor every 10 days. Use to check blood sugars every morning fasting and 2 hours after largest meal   Class: Print    Send updated info to :   [facility] - [place], [place] Phone:  [phone]  Fax:  [phone]     If any questions pls contact pt @ [phone]  --glh

## 2020-07-01 ENCOUNTER — Other Ambulatory Visit: Payer: Self-pay | Admitting: Family Medicine

## 2020-07-09 DIAGNOSIS — D0511 Intraductal carcinoma in situ of right breast: Secondary | ICD-10-CM | POA: Diagnosis not present

## 2020-07-11 ENCOUNTER — Other Ambulatory Visit: Payer: Self-pay | Admitting: Physician Assistant

## 2020-07-11 MED ORDER — LOSARTAN POTASSIUM 25 MG PO TABS: 25.0000 mg | ORAL_TABLET | Freq: Every day | ORAL | 0 refills | Status: DC

## 2020-07-31 DIAGNOSIS — K5730 Diverticulosis of large intestine without perforation or abscess without bleeding: Secondary | ICD-10-CM | POA: Diagnosis not present

## 2020-07-31 DIAGNOSIS — K838 Other specified diseases of biliary tract: Secondary | ICD-10-CM | POA: Diagnosis not present

## 2020-07-31 DIAGNOSIS — I700 Atherosclerosis of aorta: Secondary | ICD-10-CM | POA: Diagnosis not present

## 2020-07-31 DIAGNOSIS — C8339 Diffuse large B-cell lymphoma, extranodal and solid organ sites: Secondary | ICD-10-CM | POA: Diagnosis not present

## 2020-07-31 DIAGNOSIS — D7389 Other diseases of spleen: Secondary | ICD-10-CM | POA: Diagnosis not present

## 2020-07-31 DIAGNOSIS — J984 Other disorders of lung: Secondary | ICD-10-CM | POA: Diagnosis not present

## 2020-07-31 DIAGNOSIS — M47817 Spondylosis without myelopathy or radiculopathy, lumbosacral region: Secondary | ICD-10-CM | POA: Diagnosis not present

## 2020-08-02 DIAGNOSIS — D0511 Intraductal carcinoma in situ of right breast: Secondary | ICD-10-CM | POA: Diagnosis not present

## 2020-08-02 DIAGNOSIS — Z8579 Personal history of other malignant neoplasms of lymphoid, hematopoietic and related tissues: Secondary | ICD-10-CM | POA: Diagnosis not present

## 2020-08-02 DIAGNOSIS — C8339 Diffuse large B-cell lymphoma, extranodal and solid organ sites: Secondary | ICD-10-CM | POA: Diagnosis not present

## 2020-08-27 ENCOUNTER — Other Ambulatory Visit: Payer: Self-pay | Admitting: Family Medicine

## 2020-08-27 DIAGNOSIS — Z794 Long term (current) use of insulin: Secondary | ICD-10-CM

## 2020-08-28 ENCOUNTER — Other Ambulatory Visit: Payer: Self-pay | Admitting: Physician Assistant

## 2020-08-28 DIAGNOSIS — Z794 Long term (current) use of insulin: Secondary | ICD-10-CM

## 2020-09-03 ENCOUNTER — Encounter: Payer: Self-pay | Admitting: Physician Assistant

## 2020-09-04 ENCOUNTER — Other Ambulatory Visit: Payer: Self-pay | Admitting: Physician Assistant

## 2020-09-04 DIAGNOSIS — E0821 Diabetes mellitus due to underlying condition with diabetic nephropathy: Secondary | ICD-10-CM

## 2020-09-04 DIAGNOSIS — Z794 Long term (current) use of insulin: Secondary | ICD-10-CM

## 2020-09-04 MED ORDER — LANTUS SOLOSTAR 100 UNIT/ML ~~LOC~~ SOPN: 70.0000 [IU] | PEN_INJECTOR | Freq: Every day | SUBCUTANEOUS | 2 refills | Status: DC

## 2020-09-06 ENCOUNTER — Other Ambulatory Visit: Payer: Self-pay | Admitting: Physician Assistant

## 2020-09-06 DIAGNOSIS — E1169 Type 2 diabetes mellitus with other specified complication: Secondary | ICD-10-CM

## 2020-09-06 DIAGNOSIS — E538 Deficiency of other specified B group vitamins: Secondary | ICD-10-CM

## 2020-09-06 DIAGNOSIS — E531 Pyridoxine deficiency: Secondary | ICD-10-CM

## 2020-09-06 DIAGNOSIS — I152 Hypertension secondary to endocrine disorders: Secondary | ICD-10-CM

## 2020-09-06 DIAGNOSIS — E1159 Type 2 diabetes mellitus with other circulatory complications: Secondary | ICD-10-CM

## 2020-09-09 ENCOUNTER — Other Ambulatory Visit: Payer: Self-pay

## 2020-09-09 ENCOUNTER — Other Ambulatory Visit: Payer: Medicare Other

## 2020-09-09 DIAGNOSIS — E1169 Type 2 diabetes mellitus with other specified complication: Secondary | ICD-10-CM | POA: Diagnosis not present

## 2020-09-09 DIAGNOSIS — E1159 Type 2 diabetes mellitus with other circulatory complications: Secondary | ICD-10-CM | POA: Diagnosis not present

## 2020-09-09 DIAGNOSIS — E531 Pyridoxine deficiency: Secondary | ICD-10-CM | POA: Diagnosis not present

## 2020-09-09 DIAGNOSIS — I10 Essential (primary) hypertension: Secondary | ICD-10-CM | POA: Diagnosis not present

## 2020-09-09 DIAGNOSIS — E538 Deficiency of other specified B group vitamins: Secondary | ICD-10-CM

## 2020-09-09 DIAGNOSIS — E785 Hyperlipidemia, unspecified: Secondary | ICD-10-CM

## 2020-09-09 DIAGNOSIS — I152 Hypertension secondary to endocrine disorders: Secondary | ICD-10-CM

## 2020-09-13 ENCOUNTER — Ambulatory Visit (INDEPENDENT_AMBULATORY_CARE_PROVIDER_SITE_OTHER): Payer: Medicare Other | Admitting: Physician Assistant

## 2020-09-13 ENCOUNTER — Other Ambulatory Visit: Payer: Self-pay

## 2020-09-13 ENCOUNTER — Encounter: Payer: Self-pay | Admitting: Physician Assistant

## 2020-09-13 VITALS — BP 122/72 | HR 71 | Ht 67.0 in | Wt 247.0 lb

## 2020-09-13 DIAGNOSIS — D0511 Intraductal carcinoma in situ of right breast: Secondary | ICD-10-CM

## 2020-09-13 DIAGNOSIS — E531 Pyridoxine deficiency: Secondary | ICD-10-CM

## 2020-09-13 DIAGNOSIS — E0821 Diabetes mellitus due to underlying condition with diabetic nephropathy: Secondary | ICD-10-CM | POA: Diagnosis not present

## 2020-09-13 DIAGNOSIS — E538 Deficiency of other specified B group vitamins: Secondary | ICD-10-CM | POA: Diagnosis not present

## 2020-09-13 DIAGNOSIS — M25572 Pain in left ankle and joints of left foot: Secondary | ICD-10-CM

## 2020-09-13 DIAGNOSIS — E1169 Type 2 diabetes mellitus with other specified complication: Secondary | ICD-10-CM | POA: Diagnosis not present

## 2020-09-13 DIAGNOSIS — E785 Hyperlipidemia, unspecified: Secondary | ICD-10-CM

## 2020-09-13 DIAGNOSIS — C801 Malignant (primary) neoplasm, unspecified: Secondary | ICD-10-CM

## 2020-09-13 DIAGNOSIS — Z794 Long term (current) use of insulin: Secondary | ICD-10-CM

## 2020-09-13 DIAGNOSIS — E1159 Type 2 diabetes mellitus with other circulatory complications: Secondary | ICD-10-CM

## 2020-09-13 DIAGNOSIS — I10 Essential (primary) hypertension: Secondary | ICD-10-CM | POA: Diagnosis not present

## 2020-09-13 DIAGNOSIS — I152 Hypertension secondary to endocrine disorders: Secondary | ICD-10-CM

## 2020-09-13 LAB — VITAMIN B12: Vitamin B-12: 677 pg/mL (ref 232–1245)

## 2020-09-13 LAB — LIPID PANEL
Chol/HDL Ratio: 2.8 ratio (ref 0.0–4.4)
Cholesterol, Total: 152 mg/dL (ref 100–199)
HDL: 54 mg/dL (ref >39)
LDL Chol Calc (NIH): 82 mg/dL (ref 0–99)
Triglycerides: 85 mg/dL (ref 0–149)
VLDL Cholesterol Cal: 16 mg/dL (ref 5–40)

## 2020-09-13 LAB — VITAMIN B6: Vitamin B6: 36.7 ug/L — ABNORMAL HIGH (ref 2.0–32.8)

## 2020-09-13 LAB — HEMOGLOBIN A1C
Est. average glucose Bld gHb Est-mCnc: 123 mg/dL
Hgb A1c MFr Bld: 5.9 % — ABNORMAL HIGH (ref 4.8–5.6)

## 2020-09-13 MED ORDER — BD PEN NEEDLE MINI U/F 31G X 5 MM MISC: 0 refills | Status: DC

## 2020-09-13 NOTE — Patient Instructions (Signed)
Ankle Pain The ankle joint holds your body weight and allows you to move around. Ankle pain can occur on either side or the back of one ankle or both ankles. Ankle pain may be sharp and burning or dull and aching. There may be tenderness, stiffness, redness, or warmth around the ankle. Many things can cause ankle pain, including an injury to the area and overuse of the ankle. Follow these instructions at home: Activity  Rest your ankle as told by your health care provider. Avoid any activities that cause ankle pain.  Do not use the injured limb to support your body weight until your health care provider says that you can. Use crutches as told by your health care provider.  Do exercises as told by your health care provider.  Ask your health care provider when it is safe to drive if you have a brace on your ankle. If you have a brace:  Wear the brace as told by your health care provider. Remove it only as told by your health care provider.  Loosen the brace if your toes tingle, become numb, or turn cold and blue.  Keep the brace clean.  If the brace is not waterproof: ? Do not let it get wet. ? Cover it with a watertight covering when you take a bath or shower. If you were given an elastic bandage:   Remove it when you take a bath or a shower.  Try not to move your ankle very much, but wiggle your toes from time to time. This helps to prevent swelling.  Adjust the bandage to make it more comfortable if it feels too tight.  Loosen the bandage if you have numbness or tingling in your foot or if your foot turns cold and blue. Managing pain, stiffness, and swelling   If directed, put ice on the painful area. ? If you have a removable brace or elastic bandage, remove it as told by your health care provider. ? Put ice in a plastic bag. ? Place a towel between your skin and the bag. ? Leave the ice on for 20 minutes, 2-3 times a day.  Move your toes often to avoid stiffness and to  lessen swelling.  Raise (elevate) your ankle above the level of your heart while you are sitting or lying down. General instructions  Record information about your pain. Writing down the following may be helpful for you and your health care provider: ? How often you have ankle pain. ? Where the pain is located. ? What the pain feels like.  If treatment involves wearing a prescribed shoe or insole, make sure you wear it correctly and for as long as told by your health care provider.  Take over-the-counter and prescription medicines only as told by your health care provider.  Keep all follow-up visits as told by your health care provider. This is important. Contact a health care provider if:  Your pain gets worse.  Your pain is not relieved with medicines.  You have a fever or chills.  You are having more trouble with walking.  You have new symptoms. Get help right away if:  Your foot, leg, toes, or ankle: ? Tingles or becomes numb. ? Becomes swollen. ? Turns pale or blue. Summary  Ankle pain can occur on either side or the back of one ankle or both ankles.  Ankle pain may be sharp and burning or dull and aching.  Rest your ankle as told by your health care provider.   If told, apply ice to the area.  Take over-the-counter and prescription medicines only as told by your health care provider. This information is not intended to replace advice given to you by your health care provider. Make sure you discuss any questions you have with your health care provider. Document Revised: 03/28/2019 Document Reviewed: 06/15/2018 Elsevier Patient Education  2020 Elsevier Inc.  

## 2020-09-14 LAB — URIC ACID: Uric Acid: 6.5 mg/dL (ref 3.0–7.2)

## 2020-09-21 NOTE — Progress Notes (Signed)
Established Patient Office Visit  Subjective:  Patient ID: Jasmine Davila, female    DOB: [birth year]  Age: 69 y.o. MRN: [id number]  CC:  Chief Complaint  Patient presents with  . Diabetes  . Hypertension  . Hyperlipidemia    HPI Jasmine Davila presents for follow up on diabetes, hypertension, and hyperlipidemia.  Diabetes: Pt denies increased urination or thirst. Pt reports medication compliance. She likes the dexcom sensor/device. FBS average 90s-120s, reports when her sugar is low she starts having vision changes.   HTN: Pt denies new onset chest pain, palpitations, or dizziness. Reports left ankle swelling and pain. Denies redness, fever, or chills. States in the past she was worked up for RA which was negative.Taking antihypertensive medication as directed without side effects. She tries to stay as active as possible.   HLD: Pt taking medication as directed without issues. Denies side effects.    Past Medical History:  Diagnosis Date  . Abnormal EKG    hx of right bundle branch block  . Acute pancreatitis    HX OF  . Anemia yrs ago  . Cancer ([place]) dx 2016   B cell lymphoma- Non Hodgkins in remission  . Diabetes mellitus ([place])    TYPE 2  . Diabetic neuropathy ([place])   . Diabetic neuropathy ([place])   . Endometrial disorder   . GERD (gastroesophageal reflux disease)   . Headache    tension or sinus  . Hypertension   . Malignant lymphoma ([place])   . Morbid obesity ([place])   . Peripheral neuropathy    BOTH FEET, SLIGHT NEUROPATHY IN HANDS  . Sleep apnea    USES CPAP at times  . Vitamin B 12 deficiency     Past Surgical History:  Procedure Laterality Date  . CHOLECYSTECTOMY  03/06/1996  . colomscopy    . DILATATION & CURETTAGE/HYSTEROSCOPY WITH MYOSURE N/A 07/04/2018   Procedure: DILATATION & CURETTAGE/HYSTEROSCOPY WITH MYOSURE;  Surgeon: Salvadore Dom, MD;  Location: [facility];  Service: Gynecology;  Laterality: N/A;  .  MEDIPORT INSERTION, DOUBLE    . MEDIPORT REMOVAL      Family History  Problem Relation Age of Onset  . Cancer Mother 40       colon  . Hypertension Mother   . Osteoporosis Mother   . Heart attack Father   . Heart disease Sister   . Alcohol abuse Maternal Grandfather     Social History   Socioeconomic History  . Marital status: Married    Spouse name: Not on file  . Number of children: 4  . Years of education: 31  . Highest education level: Not on file  Occupational History  . Occupation: retired from [employer]  Tobacco Use  . Smoking status: Former Smoker    Packs/day: 0.50    Years: 4.00    Pack years: 2.00    Types: Cigarettes    Quit date: 12/21/1968    Years since quitting: 51.7  . Smokeless tobacco: Never Used  . Tobacco comment: as teenager  Vaping Use  . Vaping Use: Never used  Substance and Sexual Activity  . Alcohol use: No  . Drug use: No  . Sexual activity: Not Currently    Birth control/protection: None  Other Topics Concern  . Not on file  Social History Narrative   Lives with husband in a one story home.  Has 3 living children.  One child died in a MVA.  Retired from [employer].  Education: high school.   Social Determinants of Health   Financial Resource Strain:   . Difficulty of Paying Living Expenses: Not on file  Food Insecurity:   . Worried About Charity fundraiser in the Last Year: Not on file  . Ran Out of Food in the Last Year: Not on file  Transportation Needs:   . Lack of Transportation (Medical): Not on file  . Lack of Transportation (Non-Medical): Not on file  Physical Activity:   . Days of Exercise per Week: Not on file  . Minutes of Exercise per Session: Not on file  Stress:   . Feeling of Stress : Not on file  Social Connections:   . Frequency of Communication with Friends and Family: Not on file  . Frequency of Social Gatherings with Friends and Family: Not on file  . Attends Religious Services: Not on file   . Active Member of Clubs or Organizations: Not on file  . Attends Archivist Meetings: Not on file  . Marital Status: Not on file  Intimate Partner Violence:   . Fear of Current or Ex-Partner: Not on file  . Emotionally Abused: Not on file  . Physically Abused: Not on file  . Sexually Abused: Not on file    Outpatient Medications Prior to Visit  Medication Sig Dispense Refill  . aspirin EC 81 MG tablet Take 81 mg by mouth at bedtime.     . Cholecalciferol (VITAMIN D-3) 5000 units TABS Take 1 tablet by mouth daily.    . Continuous Blood Gluc Receiver (DEXCOM G6 RECEIVER) DEVI Use to check blood sugars every morning fasting and 2 hours after largest meal 1 each 0  . Continuous Blood Gluc Sensor (DEXCOM G6 SENSOR) MISC Apply new sensor every 10 days.  Use to check blood sugars every morning fasting and 2 hours after largest meal 9 each 1  . Continuous Blood Gluc Transmit (DEXCOM G6 TRANSMITTER) MISC Use to check blood sugars every morning fasting and 2 hours after largest meal 1 each 0  . famotidine (PEPCID) 20 MG tablet Take 1 tablet (20 mg total) by mouth 2 (two) times daily. 180 tablet 1  . fluticasone (FLONASE) 50 MCG/ACT nasal spray PLACE 1 SPRAY INTO BOTH NOSTRILS DAILY AS NEEDED FOR ALLERGIES OR RHINITIS. 16 g 5  . folic acid (FOLVITE) 765 MCG tablet Take 400 mcg by mouth daily.    . insulin glargine (LANTUS SOLOSTAR) 100 UNIT/ML Solostar Pen Inject 70 Units into the skin daily. 30 mL 2  . insulin lispro (HUMALOG KWIKPEN) 100 UNIT/ML KwikPen INJECT 20 UNITS 3 TIMES A DAY WITH MEALS 15 mL 2  . losartan (COZAAR) 25 MG tablet Take 1 tablet (25 mg total) by mouth daily. 90 tablet 0  . Multiple Vitamin (MULTIVITAMIN) tablet Take 1 tablet by mouth daily.    . niacin (NIASPAN) 1000 MG CR tablet Take 1 tablet (1,000 mg total) by mouth at bedtime. An hour after 81 mg ASA 90 tablet 3  . gabapentin (NEURONTIN) 600 MG tablet Two tabs PO TID 540 tablet 3   No facility-administered  medications prior to visit.    Allergies  Allergen Reactions  . Dilaudid [Hydromorphone Hcl] Other (See Comments)    anxious  . Doxycycline Other (See Comments)    Pancreatic issues  . Metronidazole Other (See Comments)    Cream  Burned face  . Pseudoephedrine Hcl Other (See Comments)    Pt unsure    ROS Review of Systems A  fourteen system review of systems was performed and found to be positive as per HPI.   Objective:    Physical Exam General: Well nourished, in no apparent distress. Eyes: PERRLA, EOMs, conjunctiva clr Resp: Respiratory effort- normal, ECTA B/L w/o W/R/R  Cardio: RRR S1 S2 Abdomen: no gross distention. Lymphatics:  less 2 sec cap RF M-sk: TTP and swelling of medial malleolus of left ankle without erythema or warmth present Skin: Superficial spider veins of lower extremity noted Neuro: Alert, Oriented Psych: Normal affect, Insight and Judgment appropriate.   BP 122/72   Pulse 71   Ht 5\' 7"  (1.702 m)   Wt 247 lb (112 kg)   LMP 12/21/1998 (Approximate)   SpO2 96%   BMI 38.69 kg/m  Wt Readings from Last 3 Encounters:  09/13/20 247 lb (112 kg)  06/14/20 246 lb 12.8 oz (111.9 kg)  03/14/20 243 lb 8 oz (110.5 kg)     Health Maintenance Due  Topic Date Due  . Hepatitis C Screening  Never done  . COVID-19 Vaccine (1) Never done  . TETANUS/TDAP  Never done  . PNA vac Low Risk Adult (2 of 2 - PPSV23) 09/20/2016  . FOOT EXAM  07/02/2020  . INFLUENZA VACCINE  07/21/2020    There are no preventive care reminders to display for this patient.  Lab Results  Component Value Date   TSH 1.152 06/22/2019   Lab Results  Component Value Date   WBC 5.7 03/08/2020   HGB 13.8 03/08/2020   HCT 40.9 03/08/2020   MCV 87 03/08/2020   PLT 164 03/08/2020   Lab Results  Component Value Date   NA 142 03/08/2020   K 4.5 03/08/2020   CO2 24 03/08/2020   GLUCOSE 117 (H) 03/08/2020   BUN 9 03/08/2020   CREATININE 0.77 03/08/2020   BILITOT 0.5 03/08/2020    ALKPHOS 93 03/08/2020   AST 25 03/08/2020   ALT 23 03/08/2020   PROT 7.3 03/08/2020   ALBUMIN 4.5 03/08/2020   CALCIUM 10.0 03/08/2020   ANIONGAP 8 06/23/2019   Lab Results  Component Value Date   CHOL 152 09/09/2020   Lab Results  Component Value Date   HDL 54 09/09/2020   Lab Results  Component Value Date   LDLCALC 82 09/09/2020   Lab Results  Component Value Date   TRIG 85 09/09/2020   Lab Results  Component Value Date   CHOLHDL 2.8 09/09/2020   Lab Results  Component Value Date   HGBA1C 5.9 (H) 09/09/2020      Assessment & Plan:   Problem List Items Addressed This Visit      Cardiovascular and Mediastinum   Hypertension associated with diabetes ([place]) (Chronic)     Endocrine   Hyperlipidemia associated with type 2 diabetes mellitus ([facility]) (Chronic)   Diabetes mellitus due to underlying condition with diabetic nephropathy, with long-term current use of insulin ([facility]) - Primary (Chronic)   Relevant Medications   Insulin Pen Needle (B-D UF III MINI PEN NEEDLES) 31G X 5 MM MISC     Other   H/o Cancer:   B- cell lymphoma (spleen and lung) - non-Hodgkin's (Chronic)   B12 deficiency (Chronic)   Vitamin B6 deficiency (non anemic) (Chronic)   Morbid obesity ([facility]) (Chronic)   Ductal carcinoma in situ (DCIS) of right breast    Other Visit Diagnoses    Left ankle pain, unspecified chronicity       Relevant Orders   Uric acid (Completed)  Diabetes Mellitus: - Most recent A1c stable - Continue current medication regimen. - Follow a low glucose and carbohydrate diet and continue to stay as active as possible. - Will continue to monitor.  HTN associated with diabetes: - BP is at goal. - Continue current medication regimen. - Follow DASH diet. - Stay well hydrated. - Will continue to monitor.  Hyperlipidemia associated with type 2 diabetes mellitus: -Most recent lipid panel wnl, LDL 82 (goal is <70 mg/dL) -Continue current medication regimen. -Follow a  heart health diet. -Will continue to monitor.  Vitamin B12 deficiency, Vitamin B6 deficiency: -Most recent Vit B12 wnl and Vit B6 mildly elevated -Recommend to reduce dietary Vit B6 such as meat, fish, bananas, nuts etc. -Will continue to monitor.  Morbid Obesity: -Associated with diabetes mellitus, hypertension, and hyperlipidemia. -Recommend dietary and lifestyle modifications. -Encourage to continue to stay as active as possible.  DCIS of right breast, H/o of cancer: B-cell lymphoma: -Followed by [facility])  Left ankle pain/swelling: -Localized pain and swelling of medial malleolus so will place lab order for uric acid to evaluate for possible gout. -Multiple spider veins noted so possibly contributing to symptoms. -Recommend elevation, compression socks, and low sodium diet.    Meds ordered this encounter  Medications  . Insulin Pen Needle (B-D UF III MINI PEN NEEDLES) 31G X 5 MM MISC    Sig: Use as directed.    Dispense:  100 each    Refill:  0    Follow-up: Return in 4 months (on 01/13/2021) for MCW and FBW.   Note:  This note was prepared with assistance of Dragon voice recognition software. Occasional wrong-word or sound-a-like substitutions may have occurred due to the inherent limitations of voice recognition software.   Lorrene Reid, PA-C

## 2020-10-14 ENCOUNTER — Telehealth: Payer: Self-pay | Admitting: Physician Assistant

## 2020-10-14 NOTE — Telephone Encounter (Signed)
Patient would like to see if she can get a note excusing her from [place] duty. It is on November 28, 2020. Please advise

## 2020-10-14 NOTE — Telephone Encounter (Signed)
Please advise if it is okay to write an excuse.  Jasmine Davila, CMA

## 2020-10-14 NOTE — Telephone Encounter (Signed)
Ok to excuse from [employer].  Thank you, Herb Grays

## 2020-10-15 ENCOUNTER — Other Ambulatory Visit: Payer: Self-pay | Admitting: Physician Assistant

## 2020-10-15 NOTE — Telephone Encounter (Signed)
Note written.  Pt advised to pick up at front desk.  Charyl Bigger, CMA

## 2020-10-17 ENCOUNTER — Telehealth: Payer: Self-pay | Admitting: Physician Assistant

## 2020-10-17 NOTE — Telephone Encounter (Signed)
Patient needs a more detailed note for jury duty. Perhaps list her diabetes or that she cares for her husband.

## 2020-10-17 NOTE — Telephone Encounter (Signed)
Left message for patient advising that patient needs to sign release form for [place] to be able to include medical diagnosis in her note for jury duty. AS, CMA

## 2020-10-17 NOTE — Telephone Encounter (Signed)
Patient states she will try the note she has and let us know if it doesn't work. AS, CMA

## 2020-10-24 ENCOUNTER — Other Ambulatory Visit: Payer: Self-pay

## 2020-10-24 ENCOUNTER — Ambulatory Visit (INDEPENDENT_AMBULATORY_CARE_PROVIDER_SITE_OTHER): Payer: Medicare Other

## 2020-10-24 DIAGNOSIS — Z23 Encounter for immunization: Secondary | ICD-10-CM | POA: Diagnosis not present

## 2020-10-24 NOTE — Progress Notes (Signed)
Pt here for influenza vaccine.  Screening questionnaire reviewed, VIS provided to patient, and any/all patient questions answered.  T. Elaysha Bevard, CMA  

## 2020-11-12 ENCOUNTER — Other Ambulatory Visit: Payer: Self-pay | Admitting: Physician Assistant

## 2020-11-12 DIAGNOSIS — Z794 Long term (current) use of insulin: Secondary | ICD-10-CM

## 2020-11-12 DIAGNOSIS — E0821 Diabetes mellitus due to underlying condition with diabetic nephropathy: Secondary | ICD-10-CM

## 2020-11-21 ENCOUNTER — Other Ambulatory Visit: Payer: Self-pay | Admitting: Family Medicine

## 2020-11-21 DIAGNOSIS — E1142 Type 2 diabetes mellitus with diabetic polyneuropathy: Secondary | ICD-10-CM

## 2020-11-27 ENCOUNTER — Other Ambulatory Visit: Payer: Self-pay | Admitting: Physician Assistant

## 2020-11-27 DIAGNOSIS — E0821 Diabetes mellitus due to underlying condition with diabetic nephropathy: Secondary | ICD-10-CM

## 2020-11-27 DIAGNOSIS — Z794 Long term (current) use of insulin: Secondary | ICD-10-CM

## 2020-11-27 MED ORDER — DEXCOM G6 SENSOR MISC: 1 refills | Status: DC

## 2020-11-27 MED ORDER — DEXCOM G6 RECEIVER DEVI: 0 refills | Status: DC

## 2020-11-27 MED ORDER — DEXCOM G6 TRANSMITTER MISC: 0 refills | Status: DC

## 2020-12-04 ENCOUNTER — Telehealth: Payer: Self-pay | Admitting: Physician Assistant

## 2020-12-04 DIAGNOSIS — E1142 Type 2 diabetes mellitus with diabetic polyneuropathy: Secondary | ICD-10-CM

## 2020-12-04 NOTE — Telephone Encounter (Signed)
Patient needs a refill on gabapentin and uses [facility]. Thanks

## 2020-12-05 MED ORDER — GABAPENTIN 600 MG PO TABS: ORAL_TABLET | ORAL | 0 refills | Status: DC

## 2020-12-05 NOTE — Telephone Encounter (Signed)
Refill sent to pharmacy. AS, CMA 

## 2020-12-05 NOTE — Addendum Note (Signed)
Addended by: Mickel Crow on: 12/05/2020 08:03 AM   Modules accepted: Orders

## 2020-12-17 ENCOUNTER — Other Ambulatory Visit: Payer: Self-pay | Admitting: Physician Assistant

## 2020-12-17 DIAGNOSIS — K219 Gastro-esophageal reflux disease without esophagitis: Secondary | ICD-10-CM

## 2020-12-29 ENCOUNTER — Other Ambulatory Visit: Payer: Self-pay | Admitting: Physician Assistant

## 2020-12-29 DIAGNOSIS — E538 Deficiency of other specified B group vitamins: Secondary | ICD-10-CM

## 2020-12-29 DIAGNOSIS — E531 Pyridoxine deficiency: Secondary | ICD-10-CM

## 2020-12-29 DIAGNOSIS — E0821 Diabetes mellitus due to underlying condition with diabetic nephropathy: Secondary | ICD-10-CM

## 2020-12-29 DIAGNOSIS — E785 Hyperlipidemia, unspecified: Secondary | ICD-10-CM

## 2020-12-29 DIAGNOSIS — E1169 Type 2 diabetes mellitus with other specified complication: Secondary | ICD-10-CM

## 2020-12-29 DIAGNOSIS — E1142 Type 2 diabetes mellitus with diabetic polyneuropathy: Secondary | ICD-10-CM

## 2020-12-29 DIAGNOSIS — E559 Vitamin D deficiency, unspecified: Secondary | ICD-10-CM

## 2020-12-29 DIAGNOSIS — Z794 Long term (current) use of insulin: Secondary | ICD-10-CM

## 2020-12-29 DIAGNOSIS — E1159 Type 2 diabetes mellitus with other circulatory complications: Secondary | ICD-10-CM

## 2020-12-29 DIAGNOSIS — Z0000 Encounter for general adult medical examination without abnormal findings: Secondary | ICD-10-CM

## 2020-12-29 DIAGNOSIS — I152 Hypertension secondary to endocrine disorders: Secondary | ICD-10-CM

## 2021-01-01 ENCOUNTER — Other Ambulatory Visit: Payer: Medicare Other

## 2021-01-01 ENCOUNTER — Other Ambulatory Visit: Payer: Self-pay

## 2021-01-01 DIAGNOSIS — E1169 Type 2 diabetes mellitus with other specified complication: Secondary | ICD-10-CM | POA: Diagnosis not present

## 2021-01-01 DIAGNOSIS — E1159 Type 2 diabetes mellitus with other circulatory complications: Secondary | ICD-10-CM | POA: Diagnosis not present

## 2021-01-01 DIAGNOSIS — I152 Hypertension secondary to endocrine disorders: Secondary | ICD-10-CM

## 2021-01-01 DIAGNOSIS — E538 Deficiency of other specified B group vitamins: Secondary | ICD-10-CM | POA: Diagnosis not present

## 2021-01-01 DIAGNOSIS — E531 Pyridoxine deficiency: Secondary | ICD-10-CM | POA: Diagnosis not present

## 2021-01-01 DIAGNOSIS — Z0000 Encounter for general adult medical examination without abnormal findings: Secondary | ICD-10-CM

## 2021-01-01 DIAGNOSIS — Z794 Long term (current) use of insulin: Secondary | ICD-10-CM | POA: Diagnosis not present

## 2021-01-01 DIAGNOSIS — E785 Hyperlipidemia, unspecified: Secondary | ICD-10-CM | POA: Diagnosis not present

## 2021-01-01 DIAGNOSIS — E559 Vitamin D deficiency, unspecified: Secondary | ICD-10-CM | POA: Diagnosis not present

## 2021-01-01 DIAGNOSIS — E0821 Diabetes mellitus due to underlying condition with diabetic nephropathy: Secondary | ICD-10-CM

## 2021-01-01 DIAGNOSIS — E1142 Type 2 diabetes mellitus with diabetic polyneuropathy: Secondary | ICD-10-CM | POA: Diagnosis not present

## 2021-01-14 ENCOUNTER — Other Ambulatory Visit: Payer: Self-pay | Admitting: Physician Assistant

## 2021-01-14 DIAGNOSIS — Z794 Long term (current) use of insulin: Secondary | ICD-10-CM

## 2021-01-14 DIAGNOSIS — E0821 Diabetes mellitus due to underlying condition with diabetic nephropathy: Secondary | ICD-10-CM

## 2021-01-15 ENCOUNTER — Encounter: Payer: Self-pay | Admitting: Physician Assistant

## 2021-01-15 ENCOUNTER — Ambulatory Visit (INDEPENDENT_AMBULATORY_CARE_PROVIDER_SITE_OTHER): Payer: Medicare Other | Admitting: Physician Assistant

## 2021-01-15 ENCOUNTER — Other Ambulatory Visit: Payer: Self-pay

## 2021-01-15 VITALS — BP 128/78 | Temp 98.0°F | Ht 67.0 in | Wt 242.0 lb

## 2021-01-15 DIAGNOSIS — Z0000 Encounter for general adult medical examination without abnormal findings: Secondary | ICD-10-CM

## 2021-01-15 NOTE — Progress Notes (Signed)
Virtual Visit via Telephone Note:  I connected with Jasmine Davila by telephone and verified that I am speaking with the correct person using two identifiers.    I discussed the limitations, risks, security and privacy concerns for performing an evaluation and management service by telephone and the availability of in person appointments. The staff discussed with patient that there may be a patient responsible charge related to this service. The patient expressed understanding and agreed to proceed.   Location of Patient- Home Location of Provider- Office   Subjective:   Jasmine Davila is a 70 y.o. female who presents for Medicare Annual (Subsequent) preventive examination.  Review of Systems    General:   No F/C, wt loss Pulm:   No DIB, SOB, pleuritic chest pain Card:  No CP, palpitations Abd:  No n/v/d or pain Ext:  No inc edema from baseline    Objective:    Today's Vitals   01/15/21 0945  BP: 128/78  Temp: 98 F (36.7 C)  Weight: 242 lb (109.8 kg)  Height: 5\' 7"  (1.702 m)   Body mass index is 37.9 kg/m.  Advanced Directives 06/21/2019 06/21/2019 10/19/2018 10/18/2018 06/30/2018 04/13/2018 04/13/2018  Does Patient Have a Medical Advance Directive? No No No No Yes No No  Type of Advance Directive - - - - Press photographer;Living will - -  Copy of [place] in Chart? - - - - No - copy requested - -  Would patient like information on creating a medical advance directive? No - Patient declined - No - Patient declined - - No - Patient declined No - Patient declined    Current Medications (verified) Outpatient Encounter Medications as of 01/15/2021  Medication Sig  . aspirin EC 81 MG tablet Take 81 mg by mouth at bedtime.   . Cholecalciferol (VITAMIN D-3) 5000 units TABS Take 1 tablet by mouth daily.  . Continuous Blood Gluc Receiver (DEXCOM G6 RECEIVER) DEVI Use to check blood sugars every morning fasting and 2 hours after largest meal   . Continuous Blood Gluc Sensor (DEXCOM G6 SENSOR) MISC Apply new sensor every 10 days.  Use to check blood sugars every morning fasting and 2 hours after largest meal  . Continuous Blood Gluc Transmit (DEXCOM G6 TRANSMITTER) MISC Use to check blood sugars every morning fasting and 2 hours after largest meal  . famotidine (PEPCID) 20 MG tablet TAKE 1 TABLET BY MOUTH 2 TIMES DAILY.  . fluticasone (FLONASE) 50 MCG/ACT nasal spray PLACE 1 SPRAY INTO BOTH NOSTRILS DAILY AS NEEDED FOR ALLERGIES OR RHINITIS.  . folic acid (FOLVITE) A999333 MCG tablet Take 400 mcg by mouth daily.  Marland Kitchen gabapentin (NEURONTIN) 600 MG tablet Two tabs PO TID  . insulin lispro (HUMALOG KWIKPEN) 100 UNIT/ML KwikPen INJECT 20 UNITS 3 TIMES A DAY WITH MEALS  . Insulin Pen Needle (B-D UF III MINI PEN NEEDLES) 31G X 5 MM MISC Use as directed.  Marland Kitchen LANTUS SOLOSTAR 100 UNIT/ML Solostar Pen INJECT 70 UNITS INTO THE SKIN DAILY.  Marland Kitchen losartan (COZAAR) 25 MG tablet TAKE 1 TABLET BY MOUTH DAILY.  . Multiple Vitamin (MULTIVITAMIN) tablet Take 1 tablet by mouth daily.  . niacin (NIASPAN) 1000 MG CR tablet Take 1 tablet (1,000 mg total) by mouth at bedtime. An hour after 81 mg ASA   No facility-administered encounter medications on file as of 01/15/2021.    Allergies (verified) Dilaudid [hydromorphone hcl], Doxycycline, Metronidazole, and Pseudoephedrine hcl   History: Past Medical  History:  Diagnosis Date  . Abnormal EKG    hx of right bundle branch block  . Acute pancreatitis    HX OF  . Anemia yrs ago  . Cancer ([place]) dx 2016   B cell lymphoma- Non Hodgkins in remission  . Diabetes mellitus ([place])    TYPE 2  . Diabetic neuropathy ([place])   . Diabetic neuropathy ([place])   . Endometrial disorder   . GERD (gastroesophageal reflux disease)   . Headache    tension or sinus  . Hypertension   . Malignant lymphoma ([place])   . Morbid obesity ([place])   . Peripheral neuropathy    BOTH FEET, SLIGHT NEUROPATHY IN HANDS  . Sleep apnea    USES CPAP  at times  . Vitamin B 12 deficiency    Past Surgical History:  Procedure Laterality Date  . CHOLECYSTECTOMY  03/06/1996  . colomscopy    . DILATATION & CURETTAGE/HYSTEROSCOPY WITH MYOSURE N/A 07/04/2018   Procedure: DILATATION & CURETTAGE/HYSTEROSCOPY WITH MYOSURE;  Surgeon: Salvadore Dom, MD;  Location: [facility];  Service: Gynecology;  Laterality: N/A;  . MEDIPORT INSERTION, DOUBLE    . MEDIPORT REMOVAL     Family History  Problem Relation Age of Onset  . Cancer Mother 27       colon  . Hypertension Mother   . Osteoporosis Mother   . Heart attack Father   . Heart disease Sister   . Alcohol abuse Maternal Grandfather    Social History   Socioeconomic History  . Marital status: Married    Spouse name: Not on file  . Number of children: 4  . Years of education: 42  . Highest education level: Not on file  Occupational History  . Occupation: retired from Press [employer]  Tobacco Use  . Smoking status: Former Smoker    Packs/day: 0.50    Years: 4.00    Pack years: 2.00    Types: Cigarettes    Quit date: 12/21/1968    Years since quitting: 52.1  . Smokeless tobacco: Never Used  . Tobacco comment: as teenager  Vaping Use  . Vaping Use: Never used  Substance and Sexual Activity  . Alcohol use: No  . Drug use: No  . Sexual activity: Not Currently    Birth control/protection: None  Other Topics Concern  . Not on file  Social History Narrative   Lives with husband in a one story home.  Has 3 living children.  One child died in a MVA.  Retired from [employer].     Education: high school.   Social Determinants of Health   Financial Resource Strain: Not on file  Food Insecurity: Not on file  Transportation Needs: Not on file  Physical Activity: Not on file  Stress: Not on file  Social Connections: Not on file    Tobacco Counseling Counseling given: Not Answered Comment: as teenager    Diabetic?Yes         Activities of Daily  Living In your present state of health, do you have any difficulty performing the following activities: 01/15/2021 09/13/2020  Hearing? N N  Vision? N N  Difficulty concentrating or making decisions? N N  Walking or climbing stairs? N N  Dressing or bathing? N N  Doing errands, shopping? N N  Some recent data might be hidden    Patient Care Team: Lorrene Reid, PA-C as PCP - General [name], MD as Consulting Physician (Oncology) Clarene Essex, MD as  Consulting Physician (Gastroenterology) Alda Berthold, DO as Consulting Physician (Neurology) Edrick Kins, DPM as Consulting Physician (Podiatry) Care, [place] [facility] (Family Medicine) Arta Silence, MD as Consulting Physician (Gastroenterology) Mont Dutton, Murray Hodgkins, MD as Referring Physician (Gastroenterology)  Indicate any recent Medical Services you may have received from other than [facility] providers in the past year (date may be approximate).     Assessment:   This is a routine wellness examination for [place].  Hearing/Vision screen No exam data present  Dietary issues and exercise activities discussed: -Follow a diet low in saturated and trans fats, resume low carbohydrate and glucose diet. Continue to stay as active as possible.  Goals   None    Depression Screen PHQ 2/9 Scores 01/15/2021 09/13/2020 06/14/2020 03/14/2020 11/20/2019 04/25/2019 04/11/2019  PHQ - 2 Score 0 0 0 0 0 0 0  PHQ- 9 Score 1 0 1 0 0 0 0    Fall Risk Fall Risk  01/15/2021 09/13/2020 04/25/2019 12/05/2018 07/27/2018  Falls in the past year? 0 0 0 0 No  Number falls in past yr: - - - 0 -  Injury with Fall? - - - 0 -  Follow up Falls evaluation completed Falls evaluation completed Falls evaluation completed Falls evaluation completed -    FALL RISK PREVENTION PERTAINING TO THE HOME:  Any stairs in or around the home? Yes  If so, are there any without handrails? Yes  Home free of loose throw rugs in walkways, pet beds,  electrical cords, etc? Yes  Adequate lighting in your home to reduce risk of falls? Yes   ASSISTIVE DEVICES UTILIZED TO PREVENT FALLS:  Life alert? No  Use of a cane, walker or w/c? No  Grab bars in the bathroom? Yes  Shower chair or bench in shower? Yes  Elevated toilet seat or a handicapped toilet? Yes   TIMED UP AND GO:  Was the test performed? No .  Length of time to ambulate 10 feet: 0 sec.   Telehealth   Cognitive Function: wnl   6CIT Screen 01/15/2021 04/25/2019  What Year? 0 points 0 points  What month? 0 points 0 points  What time? 0 points 0 points  Count back from 20 0 points 0 points  Months in reverse 0 points 0 points  Repeat phrase 0 points 0 points  Total Score 0 0    Immunizations Immunization History  Administered Date(s) Administered  . Fluad Quad(high Dose 65+) 09/21/2019, 10/24/2020  . Influenza, High Dose Seasonal PF 11/04/2018  . Influenza,inj,Quad PF,6+ Mos 11/24/2016  . Influenza-Unspecified 11/24/2016, 08/31/2017  . Moderna Sars-Covid-2 Vaccination 02/03/2020, 03/01/2020  . Pneumococcal Conjugate-13 09/21/2015    TDAP status: Due, Education has been provided regarding the importance of this vaccine. Advised may receive this vaccine at local pharmacy or Health Dept. Aware to provide a copy of the vaccination record if obtained from local pharmacy or Health Dept. Verbalized acceptance and understanding.  Flu Vaccine status: Completed at today's visit  Pneumococcal vaccine status: Due, Education has been provided regarding the importance of this vaccine. Advised may receive this vaccine at local pharmacy or Health Dept. Aware to provide a copy of the vaccination record if obtained from local pharmacy or Health Dept. Verbalized acceptance and understanding.  Covid-19 vaccine status: Completed vaccines  Qualifies for Shingles Vaccine? Yes   Zostavax completed No   Shingrix Completed?: No.    Education has been provided regarding the importance of  this vaccine. Patient has been advised to call  insurance company to determine out of pocket expense if they have not yet received this vaccine. Advised may also receive vaccine at local pharmacy or Health Dept. Verbalized acceptance and understanding.  Screening Tests Health Maintenance  Topic Date Due  . Hepatitis C Screening  Never done  . TETANUS/TDAP  Never done  . PNA vac Low Risk Adult (2 of 2 - PPSV23) 09/20/2016  . COVID-19 Vaccine (3 - Moderna risk 4-dose series) 03/29/2020  . FOOT EXAM  07/02/2020  . OPHTHALMOLOGY EXAM  03/27/2021  . HEMOGLOBIN A1C  07/01/2021  . MAMMOGRAM  05/27/2022  . COLONOSCOPY (Pts 45-31yrs Insurance coverage will need to be confirmed)  11/02/2029  . INFLUENZA VACCINE  Completed  . DEXA SCAN  Completed    Health Maintenance  Health Maintenance Due  Topic Date Due  . Hepatitis C Screening  Never done  . TETANUS/TDAP  Never done  . PNA vac Low Risk Adult (2 of 2 - PPSV23) 09/20/2016  . COVID-19 Vaccine (3 - Moderna risk 4-dose series) 03/29/2020  . FOOT EXAM  07/02/2020    Colorectal cancer screening: Type of screening: Colonoscopy. Completed 11/03/2019. Repeat every 10 years  Mammogram status: Completed 03/2020. Repeat every year  Bone Density status: Completed 06/15/19. Results reflect: Bone density results: NORMAL. Repeat every 3 years.  Lung Cancer Screening: (Low Dose CT Chest recommended if Age 47-80 years, 30 pack-year currently smoking OR have quit w/in 15years.) does not qualify.   Lung Cancer Screening Referral:   Additional Screening:  Hepatitis C Screening: does qualify; Completed patient declines   Vision Screening: Recommended annual ophthalmology exams for early detection of glaucoma and other disorders of the eye. Is the patient up to date with their annual eye exam?  Yes  Who is the provider or what is the name of the office in which the patient attends annual eye exams? [facility] If pt is not established with  a provider, would they like to be referred to a provider to establish care? No .   Dental Screening: Recommended annual dental exams for proper oral hygiene  Community Resource Referral / Chronic Care Management: CRR required this visit?  No   CCM required this visit?  No      Plan:  -Discussed most recent labs which are essentially within normal limits or stable from prior with exception of mildly elevated a1c but continues to be at goal. -Continue current medication regimen. -Follow up in 4 months for DM, HLD, HTN  I have personally reviewed and noted the following in the patient's chart:   . Medical and social history . Use of alcohol, tobacco or illicit drugs  . Current medications and supplements . Functional ability and status . Nutritional status . Physical activity . Advanced directives . List of other physicians . Hospitalizations, surgeries, and ER visits in previous 12 months . Vitals . Screenings to include cognitive, depression, and falls . Referrals and appointments  In addition, I have reviewed and discussed with patient certain preventive protocols, quality metrics, and best practice recommendations. A written personalized care plan for preventive services as well as general preventive health recommendations were provided to patient.

## 2021-01-15 NOTE — Patient Instructions (Signed)
Preventive Care 71 Years and Older, Female Preventive care refers to lifestyle choices and visits with your health care provider that can promote health and wellness. This includes:  A yearly physical exam. This is also called an annual wellness visit.  Regular dental and eye exams.  Immunizations.  Screening for certain conditions.  Healthy lifestyle choices, such as: ? Eating a healthy diet. ? Getting regular exercise. ? Not using drugs or products that contain nicotine and tobacco. ? Limiting alcohol use. What can I expect for my preventive care visit? Physical exam Your health care provider will check your:  Height and weight. These may be used to calculate your BMI (body mass index). BMI is a measurement that tells if you are at a healthy weight.  Heart rate and blood pressure.  Body temperature.  Skin for abnormal spots. Counseling Your health care provider may ask you questions about your:  Past medical problems.  Family's medical history.  Alcohol, tobacco, and drug use.  Emotional well-being.  Home life and relationship well-being.  Sexual activity.  Diet, exercise, and sleep habits.  History of falls.  Memory and ability to understand (cognition).  Work and work Statistician.  Pregnancy and menstrual history.  Access to firearms. What immunizations do I need? Vaccines are usually given at various ages, according to a schedule. Your health care provider will recommend vaccines for you based on your age, medical history, and lifestyle or other factors, such as travel or where you work.   What tests do I need? Blood tests  Lipid and cholesterol levels. These may be checked every 5 years, or more often depending on your overall health.  Hepatitis C test.  Hepatitis B test. Screening  Lung cancer screening. You may have this screening every year starting at age 71 if you have a 30-pack-year history of smoking and currently smoke or have quit within  the past 15 years.  Colorectal cancer screening. ? All adults should have this screening starting at age 71 and continuing until age 58. ? Your health care provider may recommend screening at age 71 if you are at increased risk. ? You will have tests every 1-10 years, depending on your results and the type of screening test.  Diabetes screening. ? This is done by checking your blood sugar (glucose) after you have not eaten for a while (fasting). ? You may have this done every 1-3 years.  Mammogram. ? This may be done every 1-2 years. ? Talk with your health care provider about how often you should have regular mammograms.  Abdominal aortic aneurysm (AAA) screening. You may need this if you are a current or former smoker.  BRCA-related cancer screening. This may be done if you have a family history of breast, ovarian, tubal, or peritoneal cancers. Other tests  STD (sexually transmitted disease) testing, if you are at risk.  Bone density scan. This is done to screen for osteoporosis. You may have this done starting at age 71. Talk with your health care provider about your test results, treatment options, and if necessary, the need for more tests. Follow these instructions at home: Eating and drinking  Eat a diet that includes fresh fruits and vegetables, whole grains, lean protein, and low-fat dairy products. Limit your intake of foods with high amounts of sugar, saturated fats, and salt.  Take vitamin and mineral supplements as recommended by your health care provider.  Do not drink alcohol if your health care provider tells you not to drink.  If you drink alcohol: ? Limit how much you have to 0-1 drink a day. ? Be aware of how much alcohol is in your drink. In the [place] one drink equals one 12 oz bottle of beer (355 mL), one 5 oz glass of wine (148 mL), or one 1 oz glass of hard liquor (44 mL).   Lifestyle  Take daily care of your teeth and gums. Brush your teeth every morning  and night with fluoride toothpaste. Floss one time each day.  Stay active. Exercise for at least 30 minutes 5 or more days each week.  Do not use any products that contain nicotine or tobacco, such as cigarettes, e-cigarettes, and chewing tobacco. If you need help quitting, ask your health care provider.  Do not use drugs.  If you are sexually active, practice safe sex. Use a condom or other form of protection in order to prevent STIs (sexually transmitted infections).  Talk with your health care provider about taking a low-dose aspirin or statin.  Find healthy ways to cope with stress, such as: ? Meditation, yoga, or listening to music. ? Journaling. ? Talking to a trusted person. ? Spending time with friends and family. Safety  Always wear your seat belt while driving or riding in a vehicle.  Do not drive: ? If you have been drinking alcohol. Do not ride with someone who has been drinking. ? When you are tired or distracted. ? While texting.  Wear a helmet and other protective equipment during sports activities.  If you have firearms in your house, make sure you follow all gun safety procedures. What's next?  Visit your health care provider once a year for an annual wellness visit.  Ask your health care provider how often you should have your eyes and teeth checked.  Stay up to date on all vaccines. This information is not intended to replace advice given to you by your health care provider. Make sure you discuss any questions you have with your health care provider. Document Revised: 11/27/2020 Document Reviewed: 12/01/2018 Elsevier Patient Education  2021 [employer]  

## 2021-01-20 LAB — LIPID PANEL
Chol/HDL Ratio: 2.8 ratio (ref 0.0–4.4)
Cholesterol, Total: 159 mg/dL (ref 100–199)
HDL: 57 mg/dL (ref >39)
LDL Chol Calc (NIH): 86 mg/dL (ref 0–99)
Triglycerides: 88 mg/dL (ref 0–149)
VLDL Cholesterol Cal: 16 mg/dL (ref 5–40)

## 2021-01-20 LAB — COMPREHENSIVE METABOLIC PANEL
ALT: 22 IU/L (ref 0–32)
AST: 25 IU/L (ref 0–40)
Albumin/Globulin Ratio: 1.3 (ref 1.2–2.2)
Albumin: 4.1 g/dL (ref 3.8–4.8)
Alkaline Phosphatase: 86 IU/L (ref 44–121)
BUN/Creatinine Ratio: 11 — ABNORMAL LOW (ref 12–28)
BUN: 8 mg/dL (ref 8–27)
Bilirubin Total: 0.5 mg/dL (ref 0.0–1.2)
CO2: 26 mmol/L (ref 20–29)
Calcium: 9.6 mg/dL (ref 8.7–10.3)
Chloride: 99 mmol/L (ref 96–106)
Creatinine, Ser: 0.7 mg/dL (ref 0.57–1.00)
GFR calc Af Amer: 102 mL/min/{1.73_m2} (ref >59)
GFR calc non Af Amer: 88 mL/min/{1.73_m2} (ref >59)
Globulin, Total: 3.1 g/dL (ref 1.5–4.5)
Glucose: 143 mg/dL — ABNORMAL HIGH (ref 65–99)
Potassium: 4.2 mmol/L (ref 3.5–5.2)
Sodium: 138 mmol/L (ref 134–144)
Total Protein: 7.2 g/dL (ref 6.0–8.5)

## 2021-01-20 LAB — CBC
Hematocrit: 38.6 % (ref 34.0–46.6)
Hemoglobin: 13.1 g/dL (ref 11.1–15.9)
MCH: 29.2 pg (ref 26.6–33.0)
MCHC: 33.9 g/dL (ref 31.5–35.7)
MCV: 86 fL (ref 79–97)
Platelets: 154 10*3/uL (ref 150–450)
RBC: 4.49 x10E6/uL (ref 3.77–5.28)
RDW: 12.4 % (ref 11.7–15.4)
WBC: 4.4 10*3/uL (ref 3.4–10.8)

## 2021-01-20 LAB — VITAMIN B6: Vitamin B6: 22.2 ug/L (ref 2.0–32.8)

## 2021-01-20 LAB — HEMOGLOBIN A1C
Est. average glucose Bld gHb Est-mCnc: 134 mg/dL
Hgb A1c MFr Bld: 6.3 % — ABNORMAL HIGH (ref 4.8–5.6)

## 2021-01-20 LAB — VITAMIN B12: Vitamin B-12: 660 pg/mL (ref 232–1245)

## 2021-01-20 LAB — TSH: TSH: 1.51 u[IU]/mL (ref 0.450–4.500)

## 2021-01-20 LAB — VITAMIN D 25 HYDROXY (VIT D DEFICIENCY, FRACTURES): Vit D, 25-Hydroxy: 49.7 ng/mL (ref 30.0–100.0)

## 2021-01-31 ENCOUNTER — Inpatient Hospital Stay (INDEPENDENT_AMBULATORY_CARE_PROVIDER_SITE_OTHER): Payer: Medicare Other | Admitting: Hematology and Oncology

## 2021-01-31 ENCOUNTER — Telehealth: Payer: Self-pay | Admitting: Hematology and Oncology

## 2021-01-31 ENCOUNTER — Encounter: Payer: Self-pay | Admitting: Hematology and Oncology

## 2021-01-31 ENCOUNTER — Other Ambulatory Visit: Payer: Self-pay

## 2021-01-31 ENCOUNTER — Inpatient Hospital Stay: Payer: Medicare Other | Attending: Hematology and Oncology

## 2021-01-31 VITALS — BP 159/74 | HR 69 | Temp 98.1°F | Resp 18 | Ht 67.0 in | Wt 249.6 lb

## 2021-01-31 DIAGNOSIS — D649 Anemia, unspecified: Secondary | ICD-10-CM | POA: Diagnosis not present

## 2021-01-31 DIAGNOSIS — C801 Malignant (primary) neoplasm, unspecified: Secondary | ICD-10-CM | POA: Diagnosis not present

## 2021-01-31 DIAGNOSIS — D0511 Intraductal carcinoma in situ of right breast: Secondary | ICD-10-CM | POA: Diagnosis not present

## 2021-01-31 DIAGNOSIS — C8339 Diffuse large B-cell lymphoma, extranodal and solid organ sites: Secondary | ICD-10-CM

## 2021-01-31 LAB — BASIC METABOLIC PANEL
BUN: 11 (ref 4–21)
CO2: 31 — AB (ref 13–22)
Chloride: 101 (ref 99–108)
Creatinine: 0.7 (ref 0.5–1.1)
Glucose: 121
Potassium: 4 (ref 3.4–5.3)
Sodium: 138 (ref 137–147)

## 2021-01-31 LAB — HEPATIC FUNCTION PANEL
ALT: 24 (ref 7–35)
AST: 33 (ref 13–35)
Alkaline Phosphatase: 88 (ref 25–125)
Bilirubin, Total: 0.7

## 2021-01-31 LAB — CBC AND DIFFERENTIAL
HCT: 38 (ref 36–46)
Hemoglobin: 13.1 (ref 12.0–16.0)
Neutrophils Absolute: 2.74
Platelets: 143 — AB (ref 150–399)
WBC: 4.9

## 2021-01-31 LAB — CBC
Absolute Lymphocytes: 1.67 (ref 0.65–4.75)
MCV: 87 (ref 81–99)
RBC: 4.37 (ref 3.87–5.11)

## 2021-01-31 LAB — COMPREHENSIVE METABOLIC PANEL
Albumin: 4.2 (ref 3.5–5.0)
Calcium: 9.3 (ref 8.7–10.7)

## 2021-01-31 NOTE — Progress Notes (Signed)
[facility]  [address] [place],  [place]  [address] [phone]  Clinic Day:  01/31/2021  Referring physician: Lorrene Reid, PA-C   CHIEF COMPLAINT:  CC:  Hormone receptor positive ductal carcinoma in situ of the right breast, as well as history of stage IV diffuse large B-cell lymphoma  Current Treatment:   Observation   HISTORY OF PRESENT ILLNESS:  Jasmine Davila is a 70 y.o. 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 Annual screening mammogram revealed an area of calcifications with in the right breast working further evaluation.  Diagnostic unilateral right mammogram in May confirmed an area of pleomorphic calcifications spanning 1.1 cm in the lower inner quadrant of the right breast.  Stereotactic biopsy revealed intermediate grade, ductal carcinoma in situ with necrosis.  Fibrocystic changes including florid and micropapillary usual ductal hyperplasia and micropapillary apocrine metaplasia were also seen.  No invasive carcinoma was identified.  Estrogen receptor was positive at 90% and progesterone receptor was positive at 95%.  She underwent lumpectomy in June.  Surgical pathology from this procedure confirmed ductal carcinoma in situ, intermediate grade.  The residual disease was only 1 mm remaining after the biopsy.  No invasive carcinoma identified and margins were free of neoplasm. We recommended chemoprevention with raloxifene, however, she declined.  She also has a history of stage IV diffuse large B-cell lymphoma diagnosed in May 2016, treated with 6 cycles of R-CHOP with a complete response.  She   has remained without evidence of recurrence. She is up to date on her colonoscopy, with the last one being in November 2020.  This is to be repeated in 5 years due to positive family history.  She had a bone density scan in June 2020, which was normal.  She has had  pancreatitis on and off since 2008.    INTERVAL HISTORY:  Jasmine Davila is here today for repeat clinical assessment.  She denies any changes in her breasts.  She denies any palpable adenopathy.  She denies fatigue, appetite changes or unintentional weight loss. She denies fevers, chills or night sweat. She  denies pain. Her appetite is good. Her weight has increased 6 pounds over last 6 months.  REVIEW OF SYSTEMS:  Review of Systems  Constitutional: Negative for appetite change, chills, fatigue, fever and unexpected weight change.  HENT:   Negative for lump/mass, mouth sores and sore throat.   Respiratory: Negative for cough and shortness of breath.   Cardiovascular: Negative for chest pain and leg swelling.  Gastrointestinal: Negative for abdominal pain, constipation, diarrhea, nausea and vomiting.  Genitourinary: Negative for difficulty urinating, dysuria, frequency and hematuria.   Musculoskeletal: Negative for arthralgias, back pain and myalgias.  Skin: Negative for rash.  Neurological: Negative for dizziness and headaches.  Psychiatric/Behavioral: Negative for depression and sleep disturbance. The patient is not nervous/anxious.      VITALS:  Blood pressure (!) 159/74, pulse 69, temperature 98.1 F (36.7 C), temperature source Oral, resp. rate 18, height 5\' 7"  (1.702 m), weight 249 lb 9.6 oz (113.2 kg), last menstrual period 12/21/1998, SpO2 99 %.  Wt Readings from Last 3 Encounters:  01/31/21 249 lb 9.6 oz (113.2 kg)  01/15/21 242 lb (109.8 kg)  09/13/20 247 lb (112 kg)    Body mass index is 39.09 kg/m.  Performance status (ECOG): 0 - Asymptomatic  PHYSICAL EXAM:  Physical Exam Vitals and nursing note reviewed.  Constitutional:  General: She is not in acute distress.    Appearance: Normal appearance.  HENT:     Mouth/Throat:     Mouth: Mucous membranes are moist.     Pharynx: Oropharynx is clear. No oropharyngeal exudate or posterior oropharyngeal erythema.  Eyes:      General: No scleral icterus.    Extraocular Movements: Extraocular movements intact.     Conjunctiva/sclera: Conjunctivae normal.     Pupils: Pupils are equal, round, and reactive to light.  Cardiovascular:     Rate and Rhythm: Normal rate and regular rhythm.     Heart sounds: Normal heart sounds. No murmur heard. No friction rub. No gallop.   Pulmonary:     Effort: Pulmonary effort is normal. No respiratory distress.     Breath sounds: Normal breath sounds. No stridor. No wheezing, rhonchi or rales.  Chest:  Breasts:     Right: Normal. No swelling, bleeding, inverted nipple, mass, nipple discharge, skin change, tenderness, axillary adenopathy or supraclavicular adenopathy.     Left: Normal. No swelling, bleeding, inverted nipple, mass, nipple discharge, skin change, tenderness, axillary adenopathy or supraclavicular adenopathy.    Abdominal:     General: There is no distension.     Palpations: Abdomen is soft. There is no hepatomegaly, splenomegaly or mass.     Tenderness: There is no abdominal tenderness. There is no guarding.  Musculoskeletal:     Cervical back: Normal range of motion and neck supple. No tenderness.     Right lower leg: No edema.     Left lower leg: No edema.  Lymphadenopathy:     Cervical: No cervical adenopathy.     Upper Body:     Right upper body: No supraclavicular or axillary adenopathy.     Left upper body: No supraclavicular or axillary adenopathy.     Lower Body: No right inguinal adenopathy. No left inguinal adenopathy.  Skin:    General: Skin is warm and dry.     Coloration: Skin is not jaundiced.     Findings: No rash.  Neurological:     Mental Status: She is alert and oriented to person, place, and time.     Cranial Nerves: No cranial nerve deficit.  Psychiatric:        Mood and Affect: Mood normal.        Behavior: Behavior normal.        Thought Content: Thought content normal.    LABS:   CBC Latest Ref Rng & Units 01/31/2021 01/01/2021  03/08/2020  WBC - 4.9 4.4 5.7  Hemoglobin 12.0 - 16.0 13.1 13.1 13.8  Hematocrit 36 - 46 38 38.6 40.9  Platelets 150 - 399 143(A) 154 164   CMP Latest Ref Rng & Units 01/31/2021 01/01/2021 03/08/2020  Glucose 65 - 99 mg/dL - 143(H) 117(H)  BUN 4 - 21 11 8 9   Creatinine 0.5 - 1.1 0.7 0.70 0.77  Sodium 137 - 147 138 138 142  Potassium 3.4 - 5.3 4.0 4.2 4.5  Chloride 99 - 108 101 99 101  CO2 13 - 22 31(A) 26 24  Calcium 8.7 - 10.7 9.3 9.6 10.0  Total Protein 6.0 - 8.5 g/dL - 7.2 7.3  Total Bilirubin 0.0 - 1.2 mg/dL - 0.5 0.5  Alkaline Phos 25 - 125 88 86 93  AST 13 - 35 33 25 25  ALT 7 - 35 24 22 23      No results found for: CEA1 / No results found for: CEA1 No results found  for: PSA1 No results found for: CAN199 No results found for: OIN867  Lab Results  Component Value Date   ALBUMINELP 4.1 08/30/2018   A1GS 0.3 08/30/2018   A2GS 0.8 08/30/2018   BETS 0.5 08/30/2018   BETA2SER 0.4 08/30/2018   GAMS 1.5 08/30/2018   SPEI  08/30/2018     Comment:     Normal Serum Protein Electrophoresis Pattern. No abnormal protein bands (M-protein) detected.    No results found for: TIBC, FERRITIN, IRONPCTSAT No results found for: LDH  STUDIES:  No results found.    HISTORY:   Past Medical History:  Diagnosis Date  . Abnormal EKG    hx of right bundle branch block  . Acute pancreatitis    HX OF  . Anemia yrs ago  . Cancer ([place]) dx 2016   B cell lymphoma- Non Hodgkins in remission  . Diabetes mellitus ([place])    TYPE 2  . Diabetic neuropathy ([place])   . Diabetic neuropathy ([place])   . Endometrial disorder   . GERD (gastroesophageal reflux disease)   . Headache    tension or sinus  . Hypertension   . Malignant lymphoma ([place])   . Morbid obesity ([place])   . Peripheral neuropathy    BOTH FEET, SLIGHT NEUROPATHY IN HANDS  . Sleep apnea    USES CPAP at times  . Vitamin B 12 deficiency     Past Surgical History:  Procedure Laterality Date  . CHOLECYSTECTOMY  03/06/1996  .  colomscopy    . DILATATION & CURETTAGE/HYSTEROSCOPY WITH MYOSURE N/A 07/04/2018   Procedure: DILATATION & CURETTAGE/HYSTEROSCOPY WITH MYOSURE;  Surgeon: Salvadore Dom, MD;  [name];  Service: Gynecology;  Laterality: N/A;  . MEDIPORT INSERTION, DOUBLE    . MEDIPORT REMOVAL      Family History  Problem Relation Age of Onset  . Cancer Mother 45       colon  . Hypertension Mother   . Osteoporosis Mother   . Heart attack Father   . Heart disease Sister   . Alcohol abuse Maternal Grandfather     Social History:  reports that she quit smoking about 52 years ago. Her smoking use included cigarettes. She has a 2.00 pack-year smoking history. She has never used smokeless tobacco. She reports that she does not drink alcohol and does not use drugs.The patient is alone today.  Allergies:  Allergies  Allergen Reactions  . Dilaudid [Hydromorphone Hcl] Other (See Comments)    anxious  . Doxycycline Other (See Comments)    Pancreatic issues  . Metronidazole Other (See Comments)    Cream  Burned face  . Pseudoephedrine Hcl Other (See Comments)    Pt unsure    Current Medications: Current Outpatient Medications  Medication Sig Dispense Refill  . aspirin EC 81 MG tablet Take 81 mg by mouth at bedtime.     . Cholecalciferol (VITAMIN D-3) 5000 units TABS Take 1 tablet by mouth daily.    . Continuous Blood Gluc Receiver (DEXCOM G6 RECEIVER) DEVI Use to check blood sugars every morning fasting and 2 hours after largest meal 1 each 0  . Continuous Blood Gluc Sensor (DEXCOM G6 SENSOR) MISC Apply new sensor every 10 days.  Use to check blood sugars every morning fasting and 2 hours after largest meal 9 each 1  . Continuous Blood Gluc Transmit (DEXCOM G6 TRANSMITTER) MISC Use to check blood sugars every morning fasting and 2 hours after largest meal 1 each 0  .  famotidine (PEPCID) 20 MG tablet TAKE 1 TABLET BY MOUTH 2 TIMES DAILY. 180 tablet 1  . fluticasone  (FLONASE) 50 MCG/ACT nasal spray PLACE 1 SPRAY INTO BOTH NOSTRILS DAILY AS NEEDED FOR ALLERGIES OR RHINITIS. 16 g 5  . folic acid (FOLVITE) 099 MCG tablet Take 400 mcg by mouth daily.    Marland Kitchen gabapentin (NEURONTIN) 600 MG tablet Two tabs PO TID 540 tablet 0  . insulin lispro (HUMALOG KWIKPEN) 100 UNIT/ML KwikPen INJECT 20 UNITS 3 TIMES A DAY WITH MEALS 15 mL 2  . Insulin Pen Needle (B-D UF III MINI PEN NEEDLES) 31G X 5 MM MISC Use as directed. 100 each 0  . LANTUS SOLOSTAR 100 UNIT/ML Solostar Pen INJECT 70 UNITS INTO THE SKIN DAILY. 30 mL 2  . losartan (COZAAR) 25 MG tablet TAKE 1 TABLET BY MOUTH DAILY. 90 tablet 0  . Multiple Vitamin (MULTIVITAMIN) tablet Take 1 tablet by mouth daily.    . niacin (NIASPAN) 1000 MG CR tablet Take 1 tablet (1,000 mg total) by mouth at bedtime. An hour after 81 mg ASA 90 tablet 3   No current facility-administered medications for this visit.     ASSESSMENT & PLAN:   Assessment:   1.  Stage 0 ductal carcinoma in situ, treated with lumpectomy. We chemoprevention with raloxifene for a total of 5 years, but she declined.  She remains without evidence of disease.  She is scheduled for mammogram and to see Dr. Lilia Pro in April. 2.  History of stage IV diffuse large B-cell lymphoma diagnosed in May 2016, treated with 6 cycles of R-CHOP chemotherapy with a complete response.  She remains without evidence of disease.   3.  The mediastinal node vs thyroid nodule, which has been stable. The radiologist feels it is more likely a thyroid nodule.   4.  Intermittent episodes of pancreatitis since 2008.  Her last episode was back in 2019 and she was treated at [facility].    Plan:  We will plan to see her back in 6 months with a CBC, comprehensive metabolic panel and CT chest, abdomen and pelvis to reassess her disease baseline for her lymphoma. The patient understands the plans discussed today and is in agreement with them.  She knows to contact our office if she develops  concerns prior to her next appointment.     Marvia Pickles, PA-C

## 2021-01-31 NOTE — Telephone Encounter (Signed)
Per 2/11 los next appt sched and given to patient 

## 2021-02-13 ENCOUNTER — Other Ambulatory Visit: Payer: Self-pay | Admitting: Physician Assistant

## 2021-02-13 DIAGNOSIS — E0821 Diabetes mellitus due to underlying condition with diabetic nephropathy: Secondary | ICD-10-CM

## 2021-02-13 DIAGNOSIS — Z794 Long term (current) use of insulin: Secondary | ICD-10-CM

## 2021-02-14 ENCOUNTER — Other Ambulatory Visit: Payer: Self-pay | Admitting: Physician Assistant

## 2021-02-14 DIAGNOSIS — E1142 Type 2 diabetes mellitus with diabetic polyneuropathy: Secondary | ICD-10-CM

## 2021-02-16 MED ORDER — GABAPENTIN 600 MG PO TABS: ORAL_TABLET | ORAL | 0 refills | Status: DC

## 2021-02-18 ENCOUNTER — Encounter: Payer: Self-pay | Admitting: Physician Assistant

## 2021-02-18 NOTE — Telephone Encounter (Signed)
Patient scheduled for 815am 02/19/21. AS, CMA

## 2021-02-19 ENCOUNTER — Encounter: Payer: Self-pay | Admitting: Physician Assistant

## 2021-02-19 ENCOUNTER — Other Ambulatory Visit: Payer: Self-pay

## 2021-02-19 ENCOUNTER — Ambulatory Visit (INDEPENDENT_AMBULATORY_CARE_PROVIDER_SITE_OTHER): Payer: Medicare Other | Admitting: Physician Assistant

## 2021-02-19 VITALS — BP 115/70 | HR 77 | Temp 98.6°F | Ht 67.0 in | Wt 248.9 lb

## 2021-02-19 DIAGNOSIS — R059 Cough, unspecified: Secondary | ICD-10-CM | POA: Diagnosis not present

## 2021-02-19 DIAGNOSIS — Z1152 Encounter for screening for COVID-19: Secondary | ICD-10-CM | POA: Diagnosis not present

## 2021-02-19 DIAGNOSIS — J069 Acute upper respiratory infection, unspecified: Secondary | ICD-10-CM | POA: Diagnosis not present

## 2021-02-19 DIAGNOSIS — R062 Wheezing: Secondary | ICD-10-CM | POA: Diagnosis not present

## 2021-02-19 MED ORDER — BENZONATATE 100 MG PO CAPS: 100.0000 mg | ORAL_CAPSULE | Freq: Three times a day (TID) | ORAL | 0 refills | Status: DC | PRN

## 2021-02-19 MED ORDER — PREDNISONE 20 MG PO TABS: ORAL_TABLET | ORAL | 0 refills | Status: DC

## 2021-02-19 NOTE — Progress Notes (Signed)
Acute Office Visit  Subjective:    Patient ID: Jasmine Davila, female    DOB: 20-Apr-1951, 70 y.o.   MRN: [id number]  Chief Complaint  Patient presents with  . URI    HPI Patient is in today for c/o head congestion, runny nose, dry cough with chest congestion, fatigue and wheezing. Symptoms started Saturday (4 days ago). Has tried mucinex, sinus medication, and nasal spray with minimal relief. Did an at home Covid-19 test yesterday which resulted negative. Denies fever, shortness of breath, N/V, or ear pain. Has been vaccinated against Covid-19, no booster yet.  Past Medical History:  Diagnosis Date  . Abnormal EKG    hx of right bundle branch block  . Acute pancreatitis    HX OF  . Anemia yrs ago  . Cancer ([place]) dx 2016   B cell lymphoma- Non Hodgkins in remission  . Diabetes mellitus ([place])    TYPE 2  . Diabetic neuropathy ([place])   . Diabetic neuropathy ([place])   . Endometrial disorder   . GERD (gastroesophageal reflux disease)   . Headache    tension or sinus  . Hypertension   . Malignant lymphoma ([place])   . Morbid obesity ([place])   . Peripheral neuropathy    BOTH FEET, SLIGHT NEUROPATHY IN HANDS  . Sleep apnea    USES CPAP at times  . Vitamin B 12 deficiency     Past Surgical History:  Procedure Laterality Date  . CHOLECYSTECTOMY  03/06/1996  . colomscopy    . DILATATION & CURETTAGE/HYSTEROSCOPY WITH MYOSURE N/A 07/04/2018   Procedure: DILATATION & CURETTAGE/HYSTEROSCOPY WITH MYOSURE;  Surgeon: Salvadore Dom, MD;  [facility];  Service: Gynecology;  Laterality: N/A;  . MEDIPORT INSERTION, DOUBLE    . MEDIPORT REMOVAL      Family History  Problem Relation Age of Onset  . Cancer Mother 25       colon  . Hypertension Mother   . Osteoporosis Mother   . Heart attack Father   . Heart disease Sister   . Alcohol abuse Maternal Grandfather     Social History   Socioeconomic History  . Marital status: Married    Spouse  name: Not on file  . Number of children: 4  . Years of education: 64  . Highest education level: Not on file  Occupational History  . Occupation: retired from [employer]  Tobacco Use  . Smoking status: Former Smoker    Packs/day: 0.50    Years: 4.00    Pack years: 2.00    Types: Cigarettes    Quit date: 12/21/1968    Years since quitting: 52.2  . Smokeless tobacco: Never Used  . Tobacco comment: as teenager  Vaping Use  . Vaping Use: Never used  Substance and Sexual Activity  . Alcohol use: No  . Drug use: No  . Sexual activity: Not Currently    Birth control/protection: None  Other Topics Concern  . Not on file  Social History Narrative   Lives with husband in a one story home.  Has 3 living children.  One child died in a MVA.  Retired from [employer].     Education: high school.   Social Determinants of Health   Financial Resource Strain: Not on file  Food Insecurity: Not on file  Transportation Needs: Not on file  Physical Activity: Not on file  Stress: Not on file  Social Connections: Not on file  Intimate Partner Violence: Not  on file    Outpatient Medications Prior to Visit  Medication Sig Dispense Refill  . aspirin EC 81 MG tablet Take 81 mg by mouth at bedtime.     . Cholecalciferol (VITAMIN D-3) 5000 units TABS Take 1 tablet by mouth daily.    . Continuous Blood Gluc Receiver (DEXCOM G6 RECEIVER) DEVI Use to check blood sugars every morning fasting and 2 hours after largest meal 1 each 0  . Continuous Blood Gluc Sensor (DEXCOM G6 SENSOR) MISC Apply new sensor every 10 days.  Use to check blood sugars every morning fasting and 2 hours after largest meal 9 each 1  . Continuous Blood Gluc Transmit (DEXCOM G6 TRANSMITTER) MISC Use to check blood sugars every morning fasting and 2 hours after largest meal 1 each 0  . famotidine (PEPCID) 20 MG tablet TAKE 1 TABLET BY MOUTH 2 TIMES DAILY. 180 tablet 1  . fluticasone (FLONASE) 50 MCG/ACT nasal spray PLACE 1  SPRAY INTO BOTH NOSTRILS DAILY AS NEEDED FOR ALLERGIES OR RHINITIS. 16 g 5  . folic acid (FOLVITE) 562 MCG tablet Take 400 mcg by mouth daily.    Marland Kitchen gabapentin (NEURONTIN) 600 MG tablet Two tabs PO TID 540 tablet 0  . insulin lispro (HUMALOG KWIKPEN) 100 UNIT/ML KwikPen INJECT 20 UNITS 3 TIMES A DAY WITH MEALS 15 mL 2  . Insulin Pen Needle (B-D UF III MINI PEN NEEDLES) 31G X 5 MM MISC Use as directed. 100 each 0  . LANTUS SOLOSTAR 100 UNIT/ML Solostar Pen INJECT 70 UNITS INTO THE SKIN DAILY. 30 mL 2  . losartan (COZAAR) 25 MG tablet TAKE 1 TABLET BY MOUTH DAILY. 90 tablet 0  . Multiple Vitamin (MULTIVITAMIN) tablet Take 1 tablet by mouth daily.    . niacin (NIASPAN) 1000 MG CR tablet Take 1 tablet (1,000 mg total) by mouth at bedtime. An hour after 81 mg ASA 90 tablet 3   No facility-administered medications prior to visit.    Allergies  Allergen Reactions  . Dilaudid [Hydromorphone Hcl] Other (See Comments)    anxious  . Doxycycline Other (See Comments)    Pancreatic issues  . Metronidazole Other (See Comments)    Cream  Burned face  . Pseudoephedrine Hcl Other (See Comments)    Pt unsure    Review of Systems A fourteen system review of systems was performed and found to be positive as per HPI.  Objective:    Physical Exam General:  Well Developed, well nourished, appropriate for stated age.  Neuro:  Alert and oriented,  extra-ocular muscles intact  HEENT:  Normocephalic, atraumatic, normal TMs of both ears, TTP of frontal and maxillary sinus, mildly boggy turbinates, mildly erythematous posterior oropharynx w/o exudates, cervical adenopathy (mildly enlarged submandibular glands)  Skin:  no gross rash, warm, pink. Cardiac:  RRR, S1 S2 Respiratory:  ECTA B/L w/o wheezing, crackles or rales, Not using accessory muscles, speaking in full sentences- unlabored. Vascular:  Ext warm, no cyanosis apprec.; cap RF less 2 sec. Psych:  No HI/SI, judgement and insight good, Euthymic mood.  Full Affect.  BP (!) 147/72   Pulse 77   Temp 98.6 F (37 C)   Ht 5\' 7"  (1.702 m)   Wt 248 lb 14.4 oz (112.9 kg)   LMP 12/21/1998 (Approximate)   SpO2 97%   BMI 38.98 kg/m  Wt Readings from Last 3 Encounters:  02/19/21 248 lb 14.4 oz (112.9 kg)  01/31/21 249 lb 9.6 oz (113.2 kg)  01/15/21 242 lb (109.8 kg)  Health Maintenance Due  Topic Date Due  . Hepatitis C Screening  Never done  . TETANUS/TDAP  Never done  . PNA vac Low Risk Adult (2 of 2 - PPSV23) 09/20/2016  . COVID-19 Vaccine (3 - Moderna risk 4-dose series) 03/29/2020  . FOOT EXAM  07/02/2020    There are no preventive care reminders to display for this patient.   Lab Results  Component Value Date   TSH 1.510 01/01/2021   Lab Results  Component Value Date   WBC 4.9 01/31/2021   HGB 13.1 01/31/2021   HCT 38 01/31/2021   MCV 87 01/31/2021   PLT 143 (A) 01/31/2021   Lab Results  Component Value Date   NA 138 01/31/2021   K 4.0 01/31/2021   CO2 31 (A) 01/31/2021   GLUCOSE 143 (H) 01/01/2021   BUN 11 01/31/2021   CREATININE 0.7 01/31/2021   BILITOT 0.5 01/01/2021   ALKPHOS 88 01/31/2021   AST 33 01/31/2021   ALT 24 01/31/2021   PROT 7.2 01/01/2021   ALBUMIN 4.2 01/31/2021   CALCIUM 9.3 01/31/2021   ANIONGAP 8 06/23/2019   Lab Results  Component Value Date   CHOL 159 01/01/2021   Lab Results  Component Value Date   HDL 57 01/01/2021   Lab Results  Component Value Date   LDLCALC 86 01/01/2021   Lab Results  Component Value Date   TRIG 88 01/01/2021   Lab Results  Component Value Date   CHOLHDL 2.8 01/01/2021   Lab Results  Component Value Date   HGBA1C 6.3 (H) 01/01/2021       Assessment & Plan:   Problem List Items Addressed This Visit   None   Visit Diagnoses    Encounter for screening for COVID-19    -  Primary   Relevant Orders   Novel Coronavirus, NAA ([facility])   Cough       Relevant Medications   benzonatate (TESSALON PERLES) 100 MG capsule   Wheezing        Relevant Medications   predniSONE (DELTASONE) 20 MG tablet     Encounter for screening for Covid-19, Cough and Wheezing: -Discussed with patient retesting for Covid-19 infection due to possibility of false negative result. Patient is agreeable. Will send rx for tessalon perles to help with cough and given remote history of wheezing and [id number] risk complication score of 5 will start short course of corticosteroid therapy. If Covid-19 test results negative and symptoms have failed to improve or worsen then will consider starting antibiotic therapy to treat for sinusitis (on exam tenderness of frontal and maxillary sinus). Patient has tolerated Augmentin in the past. -Recommend to continue home supportive care. Follow latest CDC quarantine guidelines until Covid-19 results have returned. -Monitor for worsening symptoms including shortness of breath, fever, or altered mental status.   Meds ordered this encounter  Medications  . benzonatate (TESSALON PERLES) 100 MG capsule    Sig: Take 1 capsule (100 mg total) by mouth 3 (three) times daily as needed for cough.    Dispense:  30 capsule    Refill:  0    Order Specific Question:   Supervising Provider    Answer:   Beatrice Lecher D [[id number]]  . predniSONE (DELTASONE) 20 MG tablet    Sig: Take 2 tablets by mouth once daily x 2 days, then 1 tablet x 2 days, then 0.5 tablet x 2 days    Dispense:  7 tablet    Refill:  0  Order Specific Question:   Supervising Provider    Answer:   Hali Marry [[id number]]   Note:  This note was prepared with assistance of Dragon voice recognition software. Occasional wrong-word or sound-a-like substitutions may have occurred due to the inherent limitations of voice recognition software.   Lorrene Reid, PA-C

## 2021-02-20 ENCOUNTER — Telehealth: Payer: Self-pay | Admitting: Physician Assistant

## 2021-02-20 ENCOUNTER — Encounter: Payer: Self-pay | Admitting: Physician Assistant

## 2021-02-20 DIAGNOSIS — J0140 Acute pansinusitis, unspecified: Secondary | ICD-10-CM

## 2021-02-20 LAB — SARS-COV-2, NAA 2 DAY TAT

## 2021-02-20 LAB — NOVEL CORONAVIRUS, NAA: SARS-CoV-2, NAA: NOT DETECTED

## 2021-02-20 MED ORDER — AMOXICILLIN-POT CLAVULANATE 875-125 MG PO TABS: 1.0000 | ORAL_TABLET | Freq: Two times a day (BID) | ORAL | 0 refills | Status: DC

## 2021-02-20 NOTE — Telephone Encounter (Signed)
Patient called in stating her COVID test returned negative and was asking if the antibiotics could be sent in she was told about yesterday at her visit. Thanks

## 2021-02-20 NOTE — Addendum Note (Signed)
Addended by: Mickel Crow on: 02/20/2021 05:43 PM   Modules accepted: Orders

## 2021-02-24 ENCOUNTER — Encounter: Payer: Self-pay | Admitting: Physician Assistant

## 2021-03-18 DIAGNOSIS — E119 Type 2 diabetes mellitus without complications: Secondary | ICD-10-CM | POA: Diagnosis not present

## 2021-03-18 DIAGNOSIS — H25813 Combined forms of age-related cataract, bilateral: Secondary | ICD-10-CM | POA: Diagnosis not present

## 2021-04-21 ENCOUNTER — Other Ambulatory Visit: Payer: Self-pay | Admitting: Physician Assistant

## 2021-05-07 DIAGNOSIS — Z853 Personal history of malignant neoplasm of breast: Secondary | ICD-10-CM | POA: Diagnosis not present

## 2021-05-07 DIAGNOSIS — R922 Inconclusive mammogram: Secondary | ICD-10-CM | POA: Diagnosis not present

## 2021-05-07 DIAGNOSIS — D0511 Intraductal carcinoma in situ of right breast: Secondary | ICD-10-CM | POA: Diagnosis not present

## 2021-05-13 DIAGNOSIS — Z853 Personal history of malignant neoplasm of breast: Secondary | ICD-10-CM | POA: Diagnosis not present

## 2021-05-22 ENCOUNTER — Other Ambulatory Visit: Payer: Self-pay | Admitting: Physician Assistant

## 2021-05-22 DIAGNOSIS — Z794 Long term (current) use of insulin: Secondary | ICD-10-CM

## 2021-05-22 DIAGNOSIS — E1142 Type 2 diabetes mellitus with diabetic polyneuropathy: Secondary | ICD-10-CM

## 2021-06-04 ENCOUNTER — Other Ambulatory Visit: Payer: Self-pay | Admitting: Physician Assistant

## 2021-06-04 DIAGNOSIS — Z794 Long term (current) use of insulin: Secondary | ICD-10-CM

## 2021-06-04 NOTE — Telephone Encounter (Signed)
Please call patient to schedule a follow up appointment per last AVS.

## 2021-06-10 ENCOUNTER — Other Ambulatory Visit: Payer: Self-pay

## 2021-06-10 ENCOUNTER — Encounter: Payer: Self-pay | Admitting: Physician Assistant

## 2021-06-10 DIAGNOSIS — G4730 Sleep apnea, unspecified: Secondary | ICD-10-CM

## 2021-06-11 NOTE — Telephone Encounter (Signed)
Followed up with the patient to request new CPAP machine from [facility]. Spoke with staff from [employer] and they informed me that they need clinical notes, an order, and copy of the insurance card to issue a new machine.

## 2021-06-16 NOTE — Telephone Encounter (Signed)
Faxed forms to [employer]

## 2021-06-26 ENCOUNTER — Other Ambulatory Visit: Payer: Self-pay | Admitting: Physician Assistant

## 2021-06-26 DIAGNOSIS — K219 Gastro-esophageal reflux disease without esophagitis: Secondary | ICD-10-CM

## 2021-06-30 ENCOUNTER — Encounter: Payer: Self-pay | Admitting: Physician Assistant

## 2021-07-07 ENCOUNTER — Ambulatory Visit (INDEPENDENT_AMBULATORY_CARE_PROVIDER_SITE_OTHER): Payer: Medicare Other | Admitting: Physician Assistant

## 2021-07-07 ENCOUNTER — Encounter: Payer: Self-pay | Admitting: Physician Assistant

## 2021-07-07 ENCOUNTER — Other Ambulatory Visit: Payer: Self-pay

## 2021-07-07 VITALS — BP 121/67 | HR 71 | Temp 98.4°F | Ht 67.0 in | Wt 231.5 lb

## 2021-07-07 DIAGNOSIS — M79672 Pain in left foot: Secondary | ICD-10-CM

## 2021-07-07 DIAGNOSIS — E785 Hyperlipidemia, unspecified: Secondary | ICD-10-CM

## 2021-07-07 DIAGNOSIS — E1159 Type 2 diabetes mellitus with other circulatory complications: Secondary | ICD-10-CM | POA: Diagnosis not present

## 2021-07-07 DIAGNOSIS — I152 Hypertension secondary to endocrine disorders: Secondary | ICD-10-CM

## 2021-07-07 DIAGNOSIS — Z794 Long term (current) use of insulin: Secondary | ICD-10-CM

## 2021-07-07 DIAGNOSIS — E1169 Type 2 diabetes mellitus with other specified complication: Secondary | ICD-10-CM | POA: Diagnosis not present

## 2021-07-07 LAB — POCT GLYCOSYLATED HEMOGLOBIN (HGB A1C)
HbA1c POC (<> result, manual entry): 5.4 % (ref 4.0–5.6)
HbA1c, POC (controlled diabetic range): 5.4 % (ref 0.0–7.0)
Hemoglobin A1C: 5.4 % (ref 4.0–5.6)

## 2021-07-07 LAB — POCT UA - MICROALBUMIN
Albumin/Creatinine Ratio, Urine, POC: <30
Creatinine, POC: 300 mg/dL
Microalbumin Ur, POC: 30 mg/L

## 2021-07-07 NOTE — Progress Notes (Signed)
Established Patient Office Visit  Subjective:  Patient ID: Jasmine Davila, female    DOB: 25-Apr-1951  Age: 70 y.o. MRN: [id number]  CC:  Chief Complaint  Patient presents with   Follow-up   Diabetes   Hypertension   Hyperlipidemia    HPI Jasmine Davila presents for follow up on diabetes mellitus, hypertension and hyperlipidemia. Patient has c/o lateral foot pain. Reports has noticed a bump developing which recently been tender and exacerbated by wearing shoes, has avoided wearing closed toe shoes. Wearing crocs and sandals. Patient reports also finally received her new CPAP machine.  Diabetes: Pt denies increased urination or thirst. Pt reports medication compliance. States her dexcom sensor has gave her a few low blood sugar readings but feels like device is malfunctioning. Checked her sugar with glucometer when getting low sugar notice and it was wnl. States usually can tell if her sugar is low. Has worked on weight loss by changing her diet (reduced carbohydrates) and being more active.  HTN: Pt denies chest pain, palpitations, dizziness or orthopnea. Taking medication as directed without side effects. Pt follows a low salt diet.   HLD: Pt taking medication as directed without issues. Has changed her diet.    Past Medical History:  Diagnosis Date   Abnormal EKG    hx of right bundle branch block   Acute pancreatitis    HX OF   Anemia yrs ago   Cancer [facility]) dx 2016   B cell lymphoma- Non Hodgkins in remission   Diabetes mellitus ([place])    TYPE 2   Diabetic neuropathy (HCC)    Diabetic neuropathy (HCC)    Endometrial disorder    GERD (gastroesophageal reflux disease)    Headache    tension or sinus   Hypertension    Malignant lymphoma ([place])    Morbid obesity ([place])    Peripheral neuropathy    BOTH FEET, SLIGHT NEUROPATHY IN HANDS   Sleep apnea    USES CPAP at times   Vitamin B 12 deficiency     Past Surgical History:  Procedure Laterality Date    CHOLECYSTECTOMY  03/06/1996   colomscopy     DILATATION & CURETTAGE/HYSTEROSCOPY WITH MYOSURE N/A 07/04/2018   Procedure: DILATATION & CURETTAGE/HYSTEROSCOPY WITH MYOSURE;  Surgeon: Salvadore Dom, MD;  Location: [facility];  Service: Gynecology;  Laterality: N/A;   MEDIPORT INSERTION, DOUBLE     MEDIPORT REMOVAL      Family History  Problem Relation Age of Onset   Cancer Mother 51       colon   Hypertension Mother    Osteoporosis Mother    Heart attack Father    Heart disease Sister    Alcohol abuse Maternal Grandfather     Social History   Socioeconomic History   Marital status: Married    Spouse name: Not on file   Number of children: 4   Years of education: 12   Highest education level: Not on file  Occupational History   Occupation: retired from medical billing  Tobacco Use   Smoking status: Former    Packs/day: 0.50    Years: 4.00    Pack years: 2.00    Types: Cigarettes    Quit date: 12/21/1968    Years since quitting: 52.5   Smokeless tobacco: Never   Tobacco comments:    as teenager  Vaping Use   Vaping Use: Never used  Substance and Sexual Activity   Alcohol use: No  Drug use: No   Sexual activity: Not Currently    Birth control/protection: None  Other Topics Concern   Not on file  Social History Narrative   Lives with husband in a one story home.  Has 3 living children.  One child died in a MVA.  Retired from [employer].     Education: high school.   Social Determinants of Health   Financial Resource Strain: Not on file  Food Insecurity: Not on file  Transportation Needs: Not on file  Physical Activity: Not on file  Stress: Not on file  Social Connections: Not on file  Intimate Partner Violence: Not on file    Outpatient Medications Prior to Visit  Medication Sig Dispense Refill   aspirin EC 81 MG tablet Take 81 mg by mouth at bedtime.      Cholecalciferol (VITAMIN D-3) 5000 units TABS Take 1 tablet by mouth  daily.     Continuous Blood Gluc Receiver (DEXCOM G6 RECEIVER) DEVI Use to check blood sugars every morning fasting and 2 hours after largest meal 1 each 0   Continuous Blood Gluc Sensor (DEXCOM G6 SENSOR) MISC Apply new sensor every 10 days.  Use to check blood sugars every morning fasting and 2 hours after largest meal 9 each 1   Continuous Blood Gluc Transmit (DEXCOM G6 TRANSMITTER) MISC Use to check blood sugars every morning fasting and 2 hours after largest meal 1 each 0   famotidine (PEPCID) 20 MG tablet TAKE 1 TABLET BY MOUTH 2 TIMES DAILY. 180 tablet 1   fluticasone (FLONASE) 50 MCG/ACT nasal spray PLACE 1 SPRAY INTO BOTH NOSTRILS DAILY AS NEEDED FOR ALLERGIES OR RHINITIS. 16 g 5   folic acid (FOLVITE) 761 MCG tablet Take 400 mcg by mouth daily.     gabapentin (NEURONTIN) 600 MG tablet TAKE 2 TABLETS BY MOUTH 3 TIMES A DAY 540 tablet 0   insulin lispro (HUMALOG KWIKPEN) 100 UNIT/ML KwikPen INJECT 20 UNITS 3 TIMES A DAY WITH MEALS 15 mL 2   Insulin Pen Needle (B-D UF III MINI PEN NEEDLES) 31G X 5 MM MISC Use as directed. 100 each 0   LANTUS SOLOSTAR 100 UNIT/ML Solostar Pen INJECT 70 UNITS INTO THE SKIN DAILY. 30 mL 1   losartan (COZAAR) 25 MG tablet TAKE 1 TABLET BY MOUTH DAILY. 90 tablet 0   Multiple Vitamin (MULTIVITAMIN) tablet Take 1 tablet by mouth daily.     niacin (NIASPAN) 1000 MG CR tablet Take 1 tablet (1,000 mg total) by mouth at bedtime. An hour after 81 mg ASA 90 tablet 3   amoxicillin-clavulanate (AUGMENTIN) 875-125 MG tablet Take 1 tablet by mouth 2 (two) times daily. (Patient not taking: Reported on 07/07/2021) 14 tablet 0   benzonatate (TESSALON PERLES) 100 MG capsule Take 1 capsule (100 mg total) by mouth 3 (three) times daily as needed for cough. 30 capsule 0   predniSONE (DELTASONE) 20 MG tablet Take 2 tablets by mouth once daily x 2 days, then 1 tablet x 2 days, then 0.5 tablet x 2 days 7 tablet 0   No facility-administered medications prior to visit.    Allergies   Allergen Reactions   Dilaudid [Hydromorphone Hcl] Other (See Comments)    anxious   Doxycycline Other (See Comments)    Pancreatic issues   Metronidazole Other (See Comments)    Cream  Burned face   Pseudoephedrine Hcl Other (See Comments)    Pt unsure    ROS Review of Systems Review of Systems:  A fourteen system review of systems was performed and found to be positive as per HPI.   Objective:    Physical Exam General:  Well Developed, well nourished, appropriate for stated age.  Neuro:  Alert and oriented,  extra-ocular muscles intact  HEENT:  Normocephalic, atraumatic, neck supple Skin:  no gross rash, warm, pink. Cardiac:  RRR, S1 S2 Respiratory:  ECTA B/L and A/P, Not using accessory muscles, speaking in full sentences- unlabored. Vascular:  Ext warm, no cyanosis apprec.; cap RF less 2 sec.  MSK: Good ROM and strength of LE, bony enlargement at first MTP of left foot, good dorsal pedis pulse b/l, swelling at left ankle Psych:  No HI/SI, judgement and insight good, Euthymic mood. Full Affect.  BP 121/67   Pulse 71   Temp 98.4 F (36.9 C)   Ht 5\' 7"  (1.702 m)   Wt 231 lb 8 oz (105 kg)   LMP 12/21/1998 (Approximate)   SpO2 100%   BMI 36.26 kg/m  Wt Readings from Last 3 Encounters:  07/07/21 231 lb 8 oz (105 kg)  02/19/21 248 lb 14.4 oz (112.9 kg)  01/31/21 249 lb 9.6 oz (113.2 kg)     Health Maintenance Due  Topic Date Due   Hepatitis C Screening  Never done   TETANUS/TDAP  Never done   Zoster Vaccines- Shingrix (1 of 2) Never done   PNA vac Low Risk Adult (2 of 2 - PPSV23) 09/20/2016   COVID-19 Vaccine (3 - Moderna risk series) 03/29/2020   OPHTHALMOLOGY EXAM  03/27/2021    There are no preventive care reminders to display for this patient.  Lab Results  Component Value Date   TSH 1.510 01/01/2021   Lab Results  Component Value Date   WBC 4.9 01/31/2021   HGB 13.1 01/31/2021   HCT 38 01/31/2021   MCV 87 01/31/2021   PLT 143 (A) 01/31/2021    Lab Results  Component Value Date   NA 138 01/31/2021   K 4.0 01/31/2021   CO2 31 (A) 01/31/2021   GLUCOSE 143 (H) 01/01/2021   BUN 11 01/31/2021   CREATININE 0.7 01/31/2021   BILITOT 0.5 01/01/2021   ALKPHOS 88 01/31/2021   AST 33 01/31/2021   ALT 24 01/31/2021   PROT 7.2 01/01/2021   ALBUMIN 4.2 01/31/2021   CALCIUM 9.3 01/31/2021   ANIONGAP 8 06/23/2019   Lab Results  Component Value Date   CHOL 159 01/01/2021   Lab Results  Component Value Date   HDL 57 01/01/2021   Lab Results  Component Value Date   LDLCALC 86 01/01/2021   Lab Results  Component Value Date   TRIG 88 01/01/2021   Lab Results  Component Value Date   CHOLHDL 2.8 01/01/2021   Lab Results  Component Value Date   HGBA1C 5.4 07/07/2021   HGBA1C 5.4 07/07/2021   HGBA1C 5.4 07/07/2021      Assessment & Plan:   Problem List Items Addressed This Visit       Cardiovascular and Mediastinum   Hypertension associated with diabetes ([place]) (Chronic)     Endocrine   Hyperlipidemia associated with type 2 diabetes mellitus (HCC) (Chronic)   Diabetes mellitus due to underlying condition with diabetic nephropathy, with long-term current use of insulin (HCC) - Primary (Chronic)   Relevant Orders   POCT HgB A1C (Completed)   POCT UA - Microalbumin (Completed)   Other Visit Diagnoses     Left foot pain  Relevant Orders   Ambulatory referral to Podiatry      Diabetes mellitus due to underlying condition with diabetic nephropathy, with long-term current use of insulin: -A1c has improved from 6.3-5.4, well controlled.  Discussed with patient medication adjustments by reducing Lantus to 65 units to reduce risk of hypoglycemic events.  Patient verbalized understanding. Continue Humalog. -Will request records for most recent eye exam with Dr. Posey Pronto. -UA microalbumin collected today, normal. -Will continue to monitor.  Hypertension associated with diabetes: -Controlled. -Continue current  medication regimen. -Will continue to monitor.  Hyperlipidemia associated with type 2 diabetes mellitus: -Last lipid panel: total cholesterol 159, triglycerides 88, HDL 57, LDL 86 (goal <70). -Continue current medication regimen. Recommend to continue with weight loss efforts and follow a low fat diet. -If LDL continues to be above goal then will consider treatment adjustments.  Left foot pain: -Discussed with patient s/sxs are consistent with bunion. -Will place referral to podiatry for further evaluation, patient requesting Dr. Amalia Hailey with [facility].  No orders of the defined types were placed in this encounter.   Follow-up: Return in about 3 months (around 10/07/2021) for OSA, DM, HTN.    Lorrene Reid, PA-C

## 2021-07-07 NOTE — Patient Instructions (Signed)
Chiropractor   Heart-Healthy Eating Plan Heart-healthy meal planning includes: Eating less unhealthy fats. Eating more healthy fats. Making other changes in your diet. Talk with your doctor or a diet specialist (dietitian) to create an eating plan that is right for you. What is my plan? Your doctor may recommend an eating plan that includes: Total fat: ______% or less of total calories a day. Saturated fat: ______% or less of total calories a day. Cholesterol: less than _________mg a day. What are tips for following this plan? Cooking Avoid frying your food. Try to bake, boil, grill, or broil it instead. You can also reduce fat by: Removing the skin from poultry. Removing all visible fats from meats. Steaming vegetables in water or broth. Meal planning  At meals, divide your plate into four equal parts: Fill one-half of your plate with vegetables and green salads. Fill one-fourth of your plate with whole grains. Fill one-fourth of your plate with lean protein foods. Eat 4-5 servings of vegetables per day. A serving of vegetables is: 1 cup of raw or cooked vegetables. 2 cups of raw leafy greens. Eat 4-5 servings of fruit per day. A serving of fruit is: 1 medium whole fruit.  cup of dried fruit.  cup of fresh, frozen, or canned fruit.  cup of 100% fruit juice. Eat more foods that have soluble fiber. These are apples, broccoli, carrots, beans, peas, and barley. Try to get 20-30 g of fiber per day. Eat 4-5 servings of nuts, legumes, and seeds per week: 1 serving of dried beans or legumes equals  cup after being cooked. 1 serving of nuts is  cup. 1 serving of seeds equals 1 tablespoon.  General information Eat more home-cooked food. Eat less restaurant, buffet, and fast food. Limit or avoid alcohol. Limit foods that are high in starch and sugar. Avoid fried foods. Lose weight if you are overweight. Keep track of how much salt (sodium) you eat. This is  important if you have high blood pressure. Ask your doctor to tell you more about this. Try to add vegetarian meals each week. Fats Choose healthy fats. These include olive oil and canola oil, flaxseeds, walnuts, almonds, and seeds. Eat more omega-3 fats. These include salmon, mackerel, sardines, tuna, flaxseed oil, and ground flaxseeds. Try to eat fish at least 2 times each week. Check food labels. Avoid foods with trans fats or high amounts of saturated fat. Limit saturated fats. These are often found in animal products, such as meats, butter, and cream. These are also found in plant foods, such as palm oil, palm kernel oil, and coconut oil. Avoid foods with partially hydrogenated oils in them. These have trans fats. Examples are stick margarine, some tub margarines, cookies, crackers, and other baked goods. What foods can I eat? Fruits All fresh, canned (in natural juice), or frozen fruits. Vegetables Fresh or frozen vegetables (raw, steamed, roasted, or grilled). Green salads. Grains Most grains. Choose whole wheat and whole grains most of the time. Rice andpasta, including brown rice and pastas made with whole wheat. Meats and other proteins Lean, well-trimmed beef, veal, pork, and lamb. Chicken and [place] without skin. All fish and shellfish. Wild duck, rabbit, pheasant, and venison. Egg whites or low-cholesterol egg substitutes. Dried beans, peas, lentils, and tofu. Seedsand most nuts. Dairy Low-fat or nonfat cheeses, including ricotta and mozzarella. Skim or 1% milk that is liquid, powdered, or evaporated. Buttermilk that is made with low-fatmilk. Nonfat or low-fat yogurt. Fats and oils Non-hydrogenated (trans-free)  margarines. Vegetable oils, including soybean, sesame, sunflower, olive, peanut, safflower, corn, canola, and cottonseed. Salad dressings or mayonnaisemade with a vegetable oil. Beverages Mineral water. Coffee and tea. Diet carbonated beverages. Sweets and  desserts Sherbet, gelatin, and fruit ice. Small amounts of dark chocolate. Limit all sweets and desserts. Seasonings and condiments All seasonings and condiments. The items listed above may not be a complete list of foods and drinks you can eat. Contact a dietitian for more options. What foods should I avoid? Fruits Canned fruit in heavy syrup. Fruit in cream or butter sauce. Fried fruit. Limitcoconut. Vegetables Vegetables cooked in cheese, cream, or butter sauce. Fried vegetables. Grains Breads that are made with saturated or trans fats, oils, or whole milk. Croissants. Sweet rolls. Donuts. High-fat crackers,such as cheese crackers. Meats and other proteins Fatty meats, such as hot dogs, ribs, sausage, bacon, rib-eye roast or steak. High-fat deli meats, such as salami and bologna. Caviar. Domestic duck andgoose. Organ meats, such as liver. Dairy Cream, sour cream, cream cheese, and creamed cottage cheese. Whole-milk cheeses. Whole or 2% milk that is liquid, evaporated, or condensed. Whole buttermilk. Cream sauce or high-fat cheese sauce. Yogurt that is made fromwhole milk. Fats and oils Meat fat, or shortening. Cocoa butter, hydrogenated oils, palm oil, coconut oil, palm kernel oil. Solid fats and shortenings, including bacon fat, salt pork, lard, and butter. Nondairy cream substitutes. Salad dressings with cheeseor sour cream. Beverages Regular sodas and juice drinks with added sugar. Sweets and desserts Frosting. Pudding. Cookies. Cakes. Pies. Milk chocolate or white chocolate.Buttered syrups. Full-fat ice cream or ice cream drinks. The items listed above may not be a complete list of foods and drinks to avoid. Contact a dietitian for more information. Summary Heart-healthy meal planning includes eating less unhealthy fats, eating more healthy fats, and making other changes in your diet. Eat a balanced diet. This includes fruits and vegetables, low-fat or nonfat dairy, lean protein,  nuts and legumes, whole grains, and heart-healthy oils and fats. This information is not intended to replace advice given to you by your health care provider. Make sure you discuss any questions you have with your healthcare provider. Document Revised: 02/10/2018 Document Reviewed: 01/14/2018 Elsevier Patient Education  2022 [employer].

## 2021-07-16 ENCOUNTER — Other Ambulatory Visit: Payer: Self-pay | Admitting: Physician Assistant

## 2021-07-31 ENCOUNTER — Ambulatory Visit: Payer: Medicare Other | Admitting: Oncology

## 2021-08-05 DIAGNOSIS — D1800 Hemangioma unspecified site: Secondary | ICD-10-CM | POA: Diagnosis not present

## 2021-08-05 DIAGNOSIS — C50911 Malignant neoplasm of unspecified site of right female breast: Secondary | ICD-10-CM | POA: Diagnosis not present

## 2021-08-05 DIAGNOSIS — K838 Other specified diseases of biliary tract: Secondary | ICD-10-CM | POA: Diagnosis not present

## 2021-08-05 DIAGNOSIS — Z8572 Personal history of non-Hodgkin lymphomas: Secondary | ICD-10-CM | POA: Diagnosis not present

## 2021-08-05 DIAGNOSIS — D0511 Intraductal carcinoma in situ of right breast: Secondary | ICD-10-CM | POA: Diagnosis not present

## 2021-08-05 DIAGNOSIS — C8330 Diffuse large B-cell lymphoma, unspecified site: Secondary | ICD-10-CM | POA: Diagnosis not present

## 2021-08-05 LAB — BASIC METABOLIC PANEL
BUN: 10 (ref 4–21)
CO2: 30 — AB (ref 13–22)
Chloride: 100 (ref 99–108)
Creatinine: 0.7 (ref 0.5–1.1)
Glucose: 121
Potassium: 4.2 (ref 3.4–5.3)
Sodium: 138 (ref 137–147)

## 2021-08-05 LAB — CBC AND DIFFERENTIAL
HCT: 37 (ref 36–46)
Hemoglobin: 12.3 (ref 12.0–16.0)
Neutrophils Absolute: 2.58
Platelets: 145 — AB (ref 150–399)
WBC: 4.3

## 2021-08-05 LAB — HEPATIC FUNCTION PANEL
ALT: 29 (ref 7–35)
AST: 40 — AB (ref 13–35)
Alkaline Phosphatase: 83 (ref 25–125)
Bilirubin, Total: 0.7

## 2021-08-05 LAB — COMPREHENSIVE METABOLIC PANEL
Albumin: 4.2 (ref 3.5–5.0)
Calcium: 9.5 (ref 8.7–10.7)

## 2021-08-05 LAB — CBC: RBC: 4.14 (ref 3.87–5.11)

## 2021-08-06 ENCOUNTER — Ambulatory Visit (INDEPENDENT_AMBULATORY_CARE_PROVIDER_SITE_OTHER): Payer: Medicare Other | Admitting: Podiatry

## 2021-08-06 ENCOUNTER — Other Ambulatory Visit: Payer: Self-pay

## 2021-08-06 ENCOUNTER — Ambulatory Visit (INDEPENDENT_AMBULATORY_CARE_PROVIDER_SITE_OTHER): Payer: Medicare Other

## 2021-08-06 DIAGNOSIS — M79675 Pain in left toe(s): Secondary | ICD-10-CM | POA: Diagnosis not present

## 2021-08-06 DIAGNOSIS — E0843 Diabetes mellitus due to underlying condition with diabetic autonomic (poly)neuropathy: Secondary | ICD-10-CM

## 2021-08-06 DIAGNOSIS — M79672 Pain in left foot: Secondary | ICD-10-CM

## 2021-08-06 DIAGNOSIS — M79674 Pain in right toe(s): Secondary | ICD-10-CM

## 2021-08-06 DIAGNOSIS — B351 Tinea unguium: Secondary | ICD-10-CM | POA: Diagnosis not present

## 2021-08-06 DIAGNOSIS — L989 Disorder of the skin and subcutaneous tissue, unspecified: Secondary | ICD-10-CM | POA: Diagnosis not present

## 2021-08-07 ENCOUNTER — Encounter: Payer: Self-pay | Admitting: Hematology and Oncology

## 2021-08-08 ENCOUNTER — Encounter: Payer: Self-pay | Admitting: Hematology and Oncology

## 2021-08-08 ENCOUNTER — Inpatient Hospital Stay: Payer: Medicare Other | Attending: Oncology | Admitting: Hematology and Oncology

## 2021-08-08 ENCOUNTER — Other Ambulatory Visit: Payer: Self-pay

## 2021-08-08 ENCOUNTER — Telehealth: Payer: Self-pay | Admitting: Hematology and Oncology

## 2021-08-08 VITALS — BP 148/64 | HR 65 | Temp 97.9°F | Resp 18 | Ht 67.0 in | Wt 232.0 lb

## 2021-08-08 DIAGNOSIS — C801 Malignant (primary) neoplasm, unspecified: Secondary | ICD-10-CM

## 2021-08-08 DIAGNOSIS — D0511 Intraductal carcinoma in situ of right breast: Secondary | ICD-10-CM

## 2021-08-08 NOTE — Telephone Encounter (Signed)
Per 8/19 LOS, patient scheduled for Feb 2023 Appt's.  Gave patient Appt Summary 

## 2021-08-08 NOTE — Progress Notes (Signed)
[facility]  [address] [place],  [place]  [id number] [phone]  Clinic Day:  08/11/2021  Referring physician: Lorrene Reid, PA-C   CHIEF COMPLAINT:  CC:  A 70 year old female with history of Hormone receptor positive ductal carcinoma in situ of the right breast, as well as history of stage IV diffuse large B-cell lymphoma here for CT review and evaluation.   Current Treatment:   Observation   HISTORY OF PRESENT ILLNESS:  Jasmine Davila is a 70 y.o. 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 Annual screening mammogram revealed an area of calcifications with in the right breast working further evaluation.  Diagnostic unilateral right mammogram in May confirmed an area of pleomorphic calcifications spanning 1.1 cm in the lower inner quadrant of the right breast.  Stereotactic biopsy revealed intermediate grade, ductal carcinoma in situ with necrosis.  Fibrocystic changes including florid and micropapillary usual ductal hyperplasia and micropapillary apocrine metaplasia were also seen.  No invasive carcinoma was identified.  Estrogen receptor was positive at 90% and progesterone receptor was positive at 95%.  She underwent lumpectomy in June.  Surgical pathology from this procedure confirmed ductal carcinoma in situ, intermediate grade.  The residual disease was only 1 mm remaining after the biopsy.  No invasive carcinoma identified and margins were free of neoplasm. We recommended chemoprevention with raloxifene, however, she declined.  She also has a history of stage IV diffuse large B-cell lymphoma diagnosed in May 2016, treated with 6 cycles of R-CHOP with a complete response.  She   has remained without evidence of recurrence. She is up to date on her colonoscopy, with the last one being in November 2020.  This is to be repeated in 5 years due to positive family history.  She had a  bone density scan in June 2020, which was normal.  She has had pancreatitis on and off since 2008.    INTERVAL HISTORY:  [name] is here today for repeat clinical assessment. She had CT imaging of the chest, abdomen and pelvis which revealed no evidence of metastatic breast cancer or recurrence of lymphoma. She has been well since last visit. She denies changes to her breasts. She denies fever, chills, nausea or vomiting. She denies shortness of breath, chest pain or cough. She denies issue with bowel or bladder. CBC and CMP are unremarkable from this week.  REVIEW OF SYSTEMS:  Review of Systems  Constitutional:  Negative for appetite change, chills, fatigue, fever and unexpected weight change.  HENT:   Negative for lump/mass, mouth sores and sore throat.   Respiratory:  Negative for cough and shortness of breath.   Cardiovascular:  Negative for chest pain and leg swelling.  Gastrointestinal:  Negative for abdominal pain, constipation, diarrhea, nausea and vomiting.  Genitourinary:  Negative for difficulty urinating, dysuria, frequency and hematuria.   Musculoskeletal:  Negative for arthralgias, back pain and myalgias.  Skin:  Negative for rash.  Neurological:  Negative for dizziness and headaches.  Psychiatric/Behavioral:  Negative for depression and sleep disturbance. The patient is not nervous/anxious.     VITALS:  Blood pressure (!) 148/64, pulse 65, temperature 97.9 F (36.6 C), temperature source Oral, resp. rate 18, height '5\' 7"'$  (1.702 m), weight 232 lb (105.2 kg), last menstrual period 12/21/1998, SpO2 95 %.  Wt Readings from Last 3 Encounters:  08/08/21 232 lb (105.2 kg)  07/07/21 231 lb 8 oz (105 kg)  02/19/21  248 lb 14.4 oz (112.9 kg)    Body mass index is 36.34 kg/m.  Performance status (ECOG): 0 - Asymptomatic  PHYSICAL EXAM:  Physical Exam Vitals and nursing note reviewed.  Constitutional:      General: She is not in acute distress.    Appearance: Normal appearance.   HENT:     Mouth/Throat:     Mouth: Mucous membranes are moist.     Pharynx: Oropharynx is clear. No oropharyngeal exudate or posterior oropharyngeal erythema.  Eyes:     General: No scleral icterus.    Extraocular Movements: Extraocular movements intact.     Conjunctiva/sclera: Conjunctivae normal.     Pupils: Pupils are equal, round, and reactive to light.  Cardiovascular:     Rate and Rhythm: Normal rate and regular rhythm.     Heart sounds: Normal heart sounds. No murmur heard.   No friction rub. No gallop.  Pulmonary:     Effort: Pulmonary effort is normal. No respiratory distress.     Breath sounds: Normal breath sounds. No stridor. No wheezing, rhonchi or rales.  Chest:  Breasts:    Right: Normal. No swelling, bleeding, inverted nipple, mass, nipple discharge, skin change or tenderness.     Left: Normal. No swelling, bleeding, inverted nipple, mass, nipple discharge, skin change or tenderness.  Abdominal:     General: There is no distension.     Palpations: Abdomen is soft. There is no hepatomegaly, splenomegaly or mass.     Tenderness: There is no abdominal tenderness. There is no guarding.  Musculoskeletal:     Cervical back: Normal range of motion and neck supple. No tenderness.     Right lower leg: No edema.     Left lower leg: No edema.  Lymphadenopathy:     Cervical: No cervical adenopathy.     Upper Body:     Right upper body: No supraclavicular or axillary adenopathy.     Left upper body: No supraclavicular or axillary adenopathy.     Lower Body: No right inguinal adenopathy. No left inguinal adenopathy.  Skin:    General: Skin is warm and dry.     Coloration: Skin is not jaundiced.     Findings: No rash.  Neurological:     Mental Status: She is alert and oriented to person, place, and time.     Cranial Nerves: No cranial nerve deficit.  Psychiatric:        Mood and Affect: Mood normal.        Behavior: Behavior normal.        Thought Content: Thought  content normal.   LABS:   CBC Latest Ref Rng & Units 08/05/2021 01/31/2021 01/01/2021  WBC - 4.3 4.9 4.4  Hemoglobin 12.0 - 16.0 12.3 13.1 13.1  Hematocrit 36 - 46 37 38 38.6  Platelets 150 - 399 145(A) 143(A) 154   CMP Latest Ref Rng & Units 08/05/2021 01/31/2021 01/01/2021  Glucose 65 - 99 mg/dL - - 143(H)  BUN 4 - '21 10 11 8  '$ Creatinine 0.5 - 1.1 0.7 0.7 0.70  Sodium 137 - 147 138 138 138  Potassium 3.4 - 5.3 4.2 4.0 4.2  Chloride 99 - 108 100 101 99  CO2 13 - 22 30(A) 31(A) 26  Calcium 8.7 - 10.7 9.5 9.3 9.6  Total Protein 6.0 - 8.5 g/dL - - 7.2  Total Bilirubin 0.0 - 1.2 mg/dL - - 0.5  Alkaline Phos 25 - 125 83 88 86  AST 13 - 35  40(A) 33 25  ALT 7 - 35 '29 24 22     '$ No results found for: CEA1 / No results found for: CEA1 No results found for: PSA1 No results found for: CAN199 No results found for: [id number]  Lab Results  Component Value Date   ALBUMINELP 4.1 08/30/2018   A1GS 0.3 08/30/2018   A2GS 0.8 08/30/2018   BETS 0.5 08/30/2018   BETA2SER 0.4 08/30/2018   GAMS 1.5 08/30/2018   SPEI  08/30/2018     Comment:     Normal Serum Protein Electrophoresis Pattern. No abnormal protein bands (M-protein) detected.    No results found for: TIBC, FERRITIN, IRONPCTSAT No results found for: LDH  STUDIES:  Exam(s): [id number] CT/CT CHEST-ABD-PELV W/IV CM CLINICAL DATA:  RIGHT breast carcinoma. Additional history of large B-cell lymphoma.  EXAM: CT CHEST, ABDOMEN, AND PELVIS WITH CONTRAST  TECHNIQUE: Multidetector CT imaging of the chest, abdomen and pelvis was performed following the standard protocol during bolus administration of intravenous contrast.  CONTRAST:  100 mL Omnipaque  COMPARISON:  None.  FINDINGS: CT CHEST FINDINGS  Cardiovascular: No significant vascular findings. Normal heart size. No pericardial effusion.  Mediastinum/Nodes: No axillary or supraclavicular adenopathy. No mediastinal or hilar adenopathy. No pericardial fluid.  Esophagus normal.  Lungs/Pleura: No suspicious nodularity. Band of pleural-parenchymal thickening at the LEFT lung base (image 18/4)  Musculoskeletal: No aggressive osseous lesion.. Hemangioma within thoracic vertebral body (image 44/4).  CT ABDOMEN AND PELVIS FINDINGS  Hepatobiliary: Postcholecystectomy. There is intrahepatic biliary duct dilatation LEFT hepatic lobe. The common bile duct is dilated to 12 mm. These findings are unchanged from CT 07/31/2020.  No enhancing hepatic lesion.  Pancreas: Pancreas is normal. No ductal dilatation. No pancreatic inflammation.  Spleen: Normal spleen  Adrenals/urinary tract: Adrenal glands and kidneys are normal. The ureters and bladder normal.  Stomach/Bowel: Stomach, small bowel, appendix, and cecum are normal. Multiple diverticula of the descending colon and sigmoid colon without acute inflammation.  Vascular/Lymphatic: Abdominal aorta is normal caliber with atherosclerotic calcification. There is no retroperitoneal or periportal lymphadenopathy. No pelvic lymphadenopathy.  Reproductive: Post hysterectomy.  Adnexa unremarkable  Other: No peritoneal metastasis.  Musculoskeletal: No aggressive osseous lesion.  IMPRESSION: Chest Impression:  1. No evidence of breast cancer recurrence.  No metastasis. 2. No evidence of lymphoma recurrence.  Abdomen / Pelvis Impression:  1. No evidence of lymphoma recurrence in the abdomen pelvis. 2. No evidence of metastatic breast cancer. 3. Chronic extrahepatic and LEFT intrahepatic biliary duct dilatation. Postcholecystectomy.   Electronically Signed   By: Suzy Bouchard M.D.   On: 08/05/2021 14:42  Electronically Signed By: Rennis Golden MD  Electronically Signed Date/Time: 08/16/221445 Dictate Date/Time: 08/05/21 1429  Exam(s): [id number] MAM/MAM DIGITAL W/TOMO DIAG B CLINICAL DATA:  History of RIGHT breast cancer in 2021 status post lumpectomy.  EXAM: DIGITAL DIAGNOSTIC  BILATERAL MAMMOGRAM WITH TOMOSYNTHESIS AND CAD  TECHNIQUE: Bilateral digital diagnostic mammography and breast tomosynthesis was performed. The images were evaluated with computer-aided detection.  COMPARISON:  Previous exam(s).  ACR Breast Density Category b: There are scattered areas of fibroglandular density.  FINDINGS: There are expected postsurgical changes within the RIGHT breast. There are no new dominant masses, suspicious calcifications or secondary signs of malignancy within either breast.  IMPRESSION: No evidence of malignancy within either breast. Expected postsurgical changes within the RIGHT breast.  RECOMMENDATION: Bilateral diagnostic mammogram in 1 year.  I have discussed the findings and recommendations with the patient. If applicable, a reminder letter will be sent to the patient  regarding the next appointment.  BI-RADS CATEGORY  2: Benign.   Electronically Signed   By: Franki Cabot M.D.   On: 05/07/2021 10:57  Electronically Signed By: Franki Cabot MD  Electronically Signed Date/Time: 05/18/221100 Dictate Date/Time: 05/07/21 1046   HISTORY:   Past Medical History:  Diagnosis Date   Abnormal EKG    hx of right bundle branch block   Acute pancreatitis    HX OF   Anemia yrs ago   Cancer ([facility]) dx 2016   B cell lymphoma- Non Hodgkins in remission   Diabetes mellitus ([facility])    TYPE 2   Diabetic neuropathy ([facility])    Diabetic neuropathy ([facility])    Endometrial disorder    GERD (gastroesophageal reflux disease)    Headache    tension or sinus   Hypertension    Malignant lymphoma ([facility])    Morbid obesity ([facility])    Peripheral neuropathy    BOTH FEET, SLIGHT NEUROPATHY IN HANDS   Sleep apnea    USES CPAP at times   Vitamin B 12 deficiency     Past Surgical History:  Procedure Laterality Date   CHOLECYSTECTOMY  03/06/1996   colomscopy     DILATATION & CURETTAGE/HYSTEROSCOPY WITH MYOSURE N/A 07/04/2018   Procedure: DILATATION &  CURETTAGE/HYSTEROSCOPY WITH MYOSURE;  Surgeon: Salvadore Dom, MD;  Location: [facility];  Service: Gynecology;  Laterality: N/A;   MEDIPORT INSERTION, DOUBLE     MEDIPORT REMOVAL      Family History  Problem Relation Age of Onset   Cancer Mother 11       colon   Hypertension Mother    Osteoporosis Mother    Heart attack Father    Heart disease Sister    Alcohol abuse Maternal Grandfather     Social History:  reports that she quit smoking about 52 years ago. Her smoking use included cigarettes. She has a 2.00 pack-year smoking history. She has never used smokeless tobacco. She reports that she does not drink alcohol and does not use drugs.The patient is alone today.  Allergies:  Allergies  Allergen Reactions   Dilaudid [Hydromorphone Hcl] Other (See Comments)    anxious   Doxycycline Other (See Comments)    Pancreatic issues   Metronidazole Other (See Comments)    Cream  Burned face   Pseudoephedrine Hcl Other (See Comments)    Pt unsure    Current Medications: Current Outpatient Medications  Medication Sig Dispense Refill   aspirin EC 81 MG tablet Take 81 mg by mouth at bedtime.      Cholecalciferol (VITAMIN D-3) 5000 units TABS Take 1 tablet by mouth daily.     Continuous Blood Gluc Receiver (DEXCOM G6 RECEIVER) DEVI Use to check blood sugars every morning fasting and 2 hours after largest meal 1 each 0   Continuous Blood Gluc Sensor (DEXCOM G6 SENSOR) MISC Apply new sensor every 10 days.  Use to check blood sugars every morning fasting and 2 hours after largest meal 9 each 1   Continuous Blood Gluc Transmit (DEXCOM G6 TRANSMITTER) MISC Use to check blood sugars every morning fasting and 2 hours after largest meal 1 each 0   famotidine (PEPCID) 20 MG tablet TAKE 1 TABLET BY MOUTH 2 TIMES DAILY. 180 tablet 1   fluticasone (FLONASE) 50 MCG/ACT nasal spray PLACE 1 SPRAY INTO BOTH NOSTRILS DAILY AS NEEDED FOR ALLERGIES OR RHINITIS. 16 g 5   folic acid  (FOLVITE) A999333 MCG tablet Take 400 mcg by mouth  daily.     gabapentin (NEURONTIN) 600 MG tablet TAKE 2 TABLETS BY MOUTH 3 TIMES A DAY 540 tablet 0   insulin lispro (HUMALOG KWIKPEN) 100 UNIT/ML KwikPen INJECT 20 UNITS 3 TIMES A DAY WITH MEALS 15 mL 2   Insulin Pen Needle (B-D UF III MINI PEN NEEDLES) 31G X 5 MM MISC Use as directed. 100 each 0   LANTUS SOLOSTAR 100 UNIT/ML Solostar Pen INJECT 70 UNITS INTO THE SKIN DAILY. 30 mL 1   losartan (COZAAR) 25 MG tablet TAKE 1 TABLET BY MOUTH DAILY. 90 tablet 0   Multiple Vitamin (MULTIVITAMIN) tablet Take 1 tablet by mouth daily.     niacin (NIASPAN) 1000 MG CR tablet Take 1 tablet (1,000 mg total) by mouth at bedtime. An hour after 81 mg ASA 90 tablet 3   No current facility-administered medications for this visit.     ASSESSMENT & PLAN:   Assessment:   1.  Stage 0 ductal carcinoma in situ, treated with lumpectomy. We chemoprevention with raloxifene for a total of 5 years, but she declined.  She remains without evidence of disease. Most recent mammogram is benign.  2.  History of stage IV diffuse large B-cell lymphoma diagnosed in May 2016, treated with 6 cycles of R-CHOP chemotherapy with a complete response.  Most recent imaging reveals no evidence of lymphoma recurrence.  3.  The mediastinal node vs thyroid nodule, which has been stable. The radiologist feels it is more likely a thyroid nodule.     Plan: We will see her back in 6 months for repeat evaluation. She will continue to follow with her breast surgeon.   She verbalizes understanding of and agreement to the plans discussed today. She knows to call the office should any new questions or concerns arise.    Melodye Ped, NP

## 2021-08-21 NOTE — Progress Notes (Signed)
   SUBJECTIVE Patient with a history of diabetes mellitus presents to office today complaining of elongated, thickened nails that cause pain while ambulating in shoes.  Patient is unable to trim their own nails.  Patient is here for further evaluation and treatment.   Past Medical History:  Diagnosis Date   Abnormal EKG    hx of right bundle branch block   Acute pancreatitis    HX OF   Anemia yrs ago   Cancer [facility]) dx 2016   B cell lymphoma- Non Hodgkins in remission   Diabetes mellitus ([place])    TYPE 2   Diabetic neuropathy (HCC)    Diabetic neuropathy (HCC)    Endometrial disorder    GERD (gastroesophageal reflux disease)    Headache    tension or sinus   Hypertension    Malignant lymphoma ([facility])    Morbid obesity (HCC)    Peripheral neuropathy    BOTH FEET, SLIGHT NEUROPATHY IN HANDS   Sleep apnea    USES CPAP at times   Vitamin B 12 deficiency     OBJECTIVE General Patient is awake, alert, and oriented x 3 and in no acute distress. Derm Skin is dry and supple bilateral. Negative open lesions or macerations. Remaining integument unremarkable. Nails are tender, long, thickened and dystrophic with subungual debris, consistent with onychomycosis, 1-5 bilateral. No signs of infection noted.  Hyperkeratotic preulcerative callus lesions also noted to the bilateral feet Vasc  DP and PT pedal pulses palpable bilaterally. Temperature gradient within normal limits.  Neuro Epicritic and protective threshold sensation diminished bilaterally.  Musculoskeletal Exam No symptomatic pedal deformities noted bilateral. Muscular strength within normal limits.  ASSESSMENT 1. Diabetes Mellitus w/ peripheral neuropathy 2.  Pain due to onychomycosis of toenails bilateral 3.  Preulcerative callus lesions bilateral feet  PLAN OF CARE 1. Patient evaluated today. 2. Instructed to maintain good pedal hygiene and foot care. Stressed importance of controlling blood sugar.  3. Mechanical debridement  of nails 1-5 bilaterally performed using a nail nipper. Filed with dremel without incident.  4.  Excisional debridement of the hyperkeratotic preulcerative callus lesions was also performed using a tissue nipper without incident or bleeding  5.  Return to clinic in 3 mos.     Edrick Kins, DPM [facility]  Dr. Edrick Kins, DPM    [address], [place] [address]                Office [phone]  Fax [phone]

## 2021-08-26 ENCOUNTER — Other Ambulatory Visit: Payer: Self-pay | Admitting: Physician Assistant

## 2021-08-26 DIAGNOSIS — Z794 Long term (current) use of insulin: Secondary | ICD-10-CM

## 2021-08-26 DIAGNOSIS — E1142 Type 2 diabetes mellitus with diabetic polyneuropathy: Secondary | ICD-10-CM

## 2021-08-26 DIAGNOSIS — E0821 Diabetes mellitus due to underlying condition with diabetic nephropathy: Secondary | ICD-10-CM

## 2021-10-07 ENCOUNTER — Encounter: Payer: Self-pay | Admitting: Physician Assistant

## 2021-10-07 ENCOUNTER — Other Ambulatory Visit: Payer: Self-pay

## 2021-10-07 ENCOUNTER — Ambulatory Visit (INDEPENDENT_AMBULATORY_CARE_PROVIDER_SITE_OTHER): Payer: Medicare Other | Admitting: Physician Assistant

## 2021-10-07 VITALS — BP 131/63 | HR 64 | Temp 97.8°F | Ht 67.0 in | Wt 229.0 lb

## 2021-10-07 DIAGNOSIS — Z794 Long term (current) use of insulin: Secondary | ICD-10-CM

## 2021-10-07 DIAGNOSIS — E531 Pyridoxine deficiency: Secondary | ICD-10-CM

## 2021-10-07 DIAGNOSIS — E559 Vitamin D deficiency, unspecified: Secondary | ICD-10-CM | POA: Diagnosis not present

## 2021-10-07 DIAGNOSIS — I152 Hypertension secondary to endocrine disorders: Secondary | ICD-10-CM

## 2021-10-07 DIAGNOSIS — E0821 Diabetes mellitus due to underlying condition with diabetic nephropathy: Secondary | ICD-10-CM

## 2021-10-07 DIAGNOSIS — E1159 Type 2 diabetes mellitus with other circulatory complications: Secondary | ICD-10-CM | POA: Diagnosis not present

## 2021-10-07 DIAGNOSIS — Z23 Encounter for immunization: Secondary | ICD-10-CM | POA: Diagnosis not present

## 2021-10-07 DIAGNOSIS — M25561 Pain in right knee: Secondary | ICD-10-CM | POA: Diagnosis not present

## 2021-10-07 DIAGNOSIS — E1169 Type 2 diabetes mellitus with other specified complication: Secondary | ICD-10-CM | POA: Diagnosis not present

## 2021-10-07 DIAGNOSIS — E538 Deficiency of other specified B group vitamins: Secondary | ICD-10-CM | POA: Diagnosis not present

## 2021-10-07 DIAGNOSIS — E785 Hyperlipidemia, unspecified: Secondary | ICD-10-CM | POA: Diagnosis not present

## 2021-10-07 DIAGNOSIS — M25361 Other instability, right knee: Secondary | ICD-10-CM | POA: Diagnosis not present

## 2021-10-07 DIAGNOSIS — G4730 Sleep apnea, unspecified: Secondary | ICD-10-CM | POA: Diagnosis not present

## 2021-10-07 LAB — POCT GLYCOSYLATED HEMOGLOBIN (HGB A1C): Hemoglobin A1C: 5.4 % (ref 4.0–5.6)

## 2021-10-07 MED ORDER — BD PEN NEEDLE MINI U/F 31G X 5 MM MISC: 0 refills | Status: DC

## 2021-10-07 NOTE — Assessment & Plan Note (Signed)
-  Last lipid panel wnl's, LDL 86 (goal <70). -Patient reports compliance with niacin. -Will repeat lipid panel today. -Recommend to continue weight loss efforts with dietary changes, follow low fat diet. -Will continue to monitor.

## 2021-10-07 NOTE — Assessment & Plan Note (Signed)
-  Stable.  -Continue current medication regimen. -Will collect CMP for medication monitoring.

## 2021-10-07 NOTE — Assessment & Plan Note (Signed)
-  A1c 5.4, controlled and stable. -Will continue current medication regimen. -Recommend to monitor for frequent hypoglycemic events and would recommend medication adjustments by decreasing Lantus. -Will continue to monitor.

## 2021-10-07 NOTE — Assessment & Plan Note (Signed)
-  Continue CPAP machine.

## 2021-10-07 NOTE — Progress Notes (Signed)
Established Patient Office Visit  Subjective:  Patient ID: Jasmine Davila, female    DOB: 1951/06/14  Age: 70 y.o. MRN: [id number]  CC:  Chief Complaint  Patient presents with   Follow-up   Diabetes   Hypertension   Sleep Apnea    HPI [name] presents for follow up on diabetes mellitus, hypertension and hyperlipemia. Patient reports right knee giving out randomly x about 6 months. Does have intermittent stiffness and pain of both knees. Denies swelling or erythema. No recent fall or injury. Does reports a fall where she slipped and fell on her knee, states did not have any bruising or swelling from the fall.   Diabetes mellitus: Pt denies increased urination or thirst. Pt reports medication compliance. Few hypoglycemic events which have occurred when working outside and improve after eating. Using dexcom sensor.  HTN: Pt denies chest pain, palpitations, dizziness or leg swelling. Taking medication as directed without side effects. Has not checked BP at home recently. Pt follows a low salt diet.  OSA: Patient reports new CPAP machine is working better. Does wear machine every night for about 5 hours.      Past Medical History:  Diagnosis Date   Abnormal EKG    hx of right bundle branch block   Acute pancreatitis    HX OF   Anemia yrs ago   Cancer [facility]) dx 2016   B cell lymphoma- Non Hodgkins in remission   Diabetes mellitus ([place])    TYPE 2   Diabetic neuropathy (HCC)    Diabetic neuropathy (HCC)    Endometrial disorder    GERD (gastroesophageal reflux disease)    Headache    tension or sinus   Hypertension    Malignant lymphoma ([place])    Morbid obesity ([place])    Peripheral neuropathy    BOTH FEET, SLIGHT NEUROPATHY IN HANDS   Sleep apnea    USES CPAP at times   Vitamin B 12 deficiency     Past Surgical History:  Procedure Laterality Date   CHOLECYSTECTOMY  03/06/1996   colomscopy     DILATATION & CURETTAGE/HYSTEROSCOPY WITH MYOSURE N/A  07/04/2018   Procedure: DILATATION & CURETTAGE/HYSTEROSCOPY WITH MYOSURE;  Surgeon: Salvadore Dom, MD;  Location: [place];  Service: Gynecology;  Laterality: N/A;   MEDIPORT INSERTION, DOUBLE     MEDIPORT REMOVAL      Family History  Problem Relation Age of Onset   Cancer Mother 5       colon   Hypertension Mother    Osteoporosis Mother    Heart attack Father    Heart disease Sister    Alcohol abuse Maternal Grandfather     Social History   Socioeconomic History   Marital status: Married    Spouse name: Not on file   Number of children: 4   Years of education: 12   Highest education level: Not on file  Occupational History   Occupation: retired from medical billing  Tobacco Use   Smoking status: Former    Packs/day: 0.50    Years: 4.00    Pack years: 2.00    Types: Cigarettes    Quit date: 12/21/1968    Years since quitting: 52.8   Smokeless tobacco: Never   Tobacco comments:    as teenager  Vaping Use   Vaping Use: Never used  Substance and Sexual Activity   Alcohol use: No   Drug use: No   Sexual activity: Not Currently  Birth control/protection: None  Other Topics Concern   Not on file  Social History Narrative   Lives with husband in a one story home.  Has 3 living children.  One child died in a MVA.  Retired from [employer].     Education: high school.   Social Determinants of Health   Financial Resource Strain: Not on file  Food Insecurity: Not on file  Transportation Needs: Not on file  Physical Activity: Not on file  Stress: Not on file  Social Connections: Not on file  Intimate Partner Violence: Not on file    Outpatient Medications Prior to Visit  Medication Sig Dispense Refill   aspirin EC 81 MG tablet Take 81 mg by mouth at bedtime.      Cholecalciferol (VITAMIN D-3) 5000 units TABS Take 1 tablet by mouth daily.     Continuous Blood Gluc Receiver (DEXCOM G6 RECEIVER) DEVI Use to check blood sugars every  morning fasting and 2 hours after largest meal 1 each 0   Continuous Blood Gluc Sensor (DEXCOM G6 SENSOR) MISC Apply new sensor every 10 days.  Use to check blood sugars every morning fasting and 2 hours after largest meal 9 each 1   Continuous Blood Gluc Transmit (DEXCOM G6 TRANSMITTER) MISC Use to check blood sugars every morning fasting and 2 hours after largest meal 1 each 0   famotidine (PEPCID) 20 MG tablet TAKE 1 TABLET BY MOUTH 2 TIMES DAILY. 180 tablet 1   fluticasone (FLONASE) 50 MCG/ACT nasal spray PLACE 1 SPRAY INTO BOTH NOSTRILS DAILY AS NEEDED FOR ALLERGIES OR RHINITIS. 16 g 5   folic acid (FOLVITE) 892 MCG tablet Take 400 mcg by mouth daily.     gabapentin (NEURONTIN) 600 MG tablet TAKE 2 TABLETS BY MOUTH 3 TIMES A DAY 540 tablet 0   insulin lispro (HUMALOG KWIKPEN) 100 UNIT/ML KwikPen INJECT 20 UNITS 3 TIMES A DAY WITH MEALS 15 mL 2   LANTUS SOLOSTAR 100 UNIT/ML Solostar Pen INJECT 70 UNITS INTO THE SKIN DAILY. 30 mL 1   losartan (COZAAR) 25 MG tablet TAKE 1 TABLET BY MOUTH DAILY. 90 tablet 0   Multiple Vitamin (MULTIVITAMIN) tablet Take 1 tablet by mouth daily.     niacin (NIASPAN) 1000 MG CR tablet Take 1 tablet (1,000 mg total) by mouth at bedtime. An hour after 81 mg ASA 90 tablet 3   Insulin Pen Needle (B-D UF III MINI PEN NEEDLES) 31G X 5 MM MISC Use as directed. 100 each 0   No facility-administered medications prior to visit.    Allergies  Allergen Reactions   Dilaudid [Hydromorphone Hcl] Other (See Comments)    anxious   Doxycycline Other (See Comments)    Pancreatic issues   Metronidazole Other (See Comments)    Cream  Burned face   Pseudoephedrine Hcl Other (See Comments)    Pt unsure    ROS Review of Systems Review of Systems:  A fourteen system review of systems was performed and found to be positive as per HPI.   Objective:    Physical Exam General:  Well Developed, well nourished, appropriate for stated age.  Neuro:  Alert and oriented,   extra-ocular muscles intact  HEENT:  Normocephalic, atraumatic, neck supple Skin:  no gross rash, warm, pink. Cardiac:  RRR, S1 S2 Respiratory: CTA B/L, Not using accessory muscles, speaking in full sentences- unlabored. MSK: no joint line tenderness of right knee, no effusion or laxity noted, good ROM of both LE, negative Valgus  and Varus test Vascular:  Ext warm, no cyanosis apprec.; cap RF less 2 sec. Psych:  No HI/SI, judgement and insight good, Euthymic mood. Full Affect.  BP 131/63   Pulse 64   Temp 97.8 F (36.6 C)   Ht 5' 7"  (1.702 m)   Wt 229 lb (103.9 kg)   LMP 12/21/1998 (Approximate)   SpO2 98%   BMI 35.87 kg/m  Wt Readings from Last 3 Encounters:  10/07/21 229 lb (103.9 kg)  08/08/21 232 lb (105.2 kg)  07/07/21 231 lb 8 oz (105 kg)     Health Maintenance Due  Topic Date Due   Hepatitis C Screening  Never done   TETANUS/TDAP  Never done   Zoster Vaccines- Shingrix (1 of 2) Never done   COVID-19 Vaccine (3 - Moderna risk series) 03/29/2020   OPHTHALMOLOGY EXAM  03/27/2021    There are no preventive care reminders to display for this patient.  Lab Results  Component Value Date   TSH 1.510 01/01/2021   Lab Results  Component Value Date   WBC 4.3 08/05/2021   HGB 12.3 08/05/2021   HCT 37 08/05/2021   MCV 87 01/31/2021   PLT 145 (A) 08/05/2021   Lab Results  Component Value Date   NA 138 08/05/2021   K 4.2 08/05/2021   CO2 30 (A) 08/05/2021   GLUCOSE 143 (H) 01/01/2021   BUN 10 08/05/2021   CREATININE 0.7 08/05/2021   BILITOT 0.5 01/01/2021   ALKPHOS 83 08/05/2021   AST 40 (A) 08/05/2021   ALT 29 08/05/2021   PROT 7.2 01/01/2021   ALBUMIN 4.2 08/05/2021   CALCIUM 9.5 08/05/2021   ANIONGAP 8 06/23/2019   Lab Results  Component Value Date   CHOL 159 01/01/2021   Lab Results  Component Value Date   HDL 57 01/01/2021   Lab Results  Component Value Date   LDLCALC 86 01/01/2021   Lab Results  Component Value Date   TRIG 88 01/01/2021    Lab Results  Component Value Date   CHOLHDL 2.8 01/01/2021   Lab Results  Component Value Date   HGBA1C 5.4 10/07/2021      Assessment & Plan:   Problem List Items Addressed This Visit       Cardiovascular and Mediastinum   Hypertension associated with diabetes ([place]) (Chronic)    -Stable.  -Continue current medication regimen. -Will collect CMP for medication monitoring.      Relevant Orders   Comp Met (CMET)   CBC w/Diff     Respiratory   Sleep apnea with CPAP    -Continue CPAP machine.         Endocrine   Hyperlipidemia associated with type 2 diabetes mellitus (HCC) (Chronic)    -Last lipid panel wnl's, LDL 86 (goal <70). -Patient reports compliance with niacin. -Will repeat lipid panel today. -Recommend to continue weight loss efforts with dietary changes, follow low fat diet. -Will continue to monitor.      Relevant Orders   Lipid Profile   Diabetes mellitus due to underlying condition with diabetic nephropathy, with long-term current use of insulin (HCC) (Chronic)    -A1c 5.4, controlled and stable. -Will continue current medication regimen. -Recommend to monitor for frequent hypoglycemic events and would recommend medication adjustments by decreasing Lantus. -Will continue to monitor.      Relevant Medications   Insulin Pen Needle (B-D UF III MINI PEN NEEDLES) 31G X 5 MM MISC   Other Relevant Orders   POCT glycosylated  hemoglobin (Hb A1C) (Completed)     Other   B12 deficiency (Chronic)   Relevant Orders   B12   Vitamin B6 deficiency (non anemic) (Chronic)   Relevant Orders   Vitamin B6   Vitamin D insufficiency   Relevant Orders   Vitamin D (25 hydroxy)   Other Visit Diagnoses     Need for influenza vaccination    -  Primary   Relevant Orders   Flu Vaccine QUAD High Dose(Fluad) (Completed)   Knee instability, right       Relevant Orders   DG Knee 1-2 Views Right   Recurrent pain of right knee          Right knee instability,  recurrent pain of right knee: -Discussed with patient likely MSK etiology such as arthritis and given history of fall possibly ligamentous injury, no laxity or tenderness on exam so less suspicious. Will place order for knee x-ray for further evaluation. If symptoms fail to improve or worsen recommend referral to Orthopedics/Sports med.  Meds ordered this encounter  Medications   Insulin Pen Needle (B-D UF III MINI PEN NEEDLES) 31G X 5 MM MISC    Sig: Use as directed.    Dispense:  100 each    Refill:  0    Brand name not necessary.    Order Specific Question:   Supervising Provider    Answer:   Beatrice Lecher D [[id number]]    Follow-up: Return in about 3 months (around 01/15/2022) for [facility] .    Lorrene Reid, PA-C

## 2021-10-07 NOTE — Patient Instructions (Signed)
Osteoarthritis Osteoarthritis is a type of arthritis. It refers to joint pain or joint disease. Osteoarthritis affects tissue that covers the ends of bones in joints (cartilage). Cartilage acts as a cushion between the bones and helps them move smoothly. Osteoarthritis occurs when cartilage in the joints gets worn down. Osteoarthritis is sometimes called "wear and tear" arthritis. Osteoarthritis is the most common form of arthritis. It often occurs in older people. It is a condition that gets worse over time. The joints most often affected by this condition are in the fingers, toes, hips, knees, and spine, including the neck and lower back. What are the causes? This condition is caused by the wearing down of cartilage that covers the ends of bones. What increases the risk? The following factors may make you more likely to develop this condition: Being age 50 or older. Obesity. Overuse of joints. Past injury of a joint. Past surgery on a joint. Family history of osteoarthritis. What are the signs or symptoms? The main symptoms of this condition are pain, swelling, and stiffness in the joint. Other symptoms may include: An enlarged joint. More pain and further damage caused by small pieces of bone or cartilage that break off and float inside of the joint. Small deposits of bone (osteophytes) that grow on the edges of the joint. A grating or scraping feeling inside the joint when you move it. Popping or creaking sounds when you move. Difficulty walking or exercising. An inability to grip items, twist your hand(s), or control the movements of your hands and fingers. How is this diagnosed? This condition may be diagnosed based on: Your medical history. A physical exam. Your symptoms. X-rays of the affected joint(s). Blood tests to rule out other types of arthritis. How is this treated? There is no cure for this condition, but treatment can help control pain and improve joint function.  Treatment may include a combination of therapies, such as: Pain relief techniques, such as: Applying heat and cold to the joint. Massage. A form of talk therapy called cognitive behavioral therapy (CBT). This therapy helps you set goals and follow up on the changes that you make. Medicines for pain and inflammation. The medicines can be taken by mouth or applied to the skin. They include: NSAIDs, such as ibuprofen. Prescription medicines. Strong anti-inflammatory medicines (corticosteroids). Certain nutritional supplements. A prescribed exercise program. You may work with a physical therapist. Assistive devices, such as a brace, wrap, splint, specialized glove, or cane. A weight control plan. Surgery, such as: An osteotomy. This is done to reposition the bones and relieve pain or to remove loose pieces of bone and cartilage. Joint replacement surgery. You may need this surgery if you have advanced osteoarthritis. Follow these instructions at home: Activity Rest your affected joints as told by your health care provider. Exercise as told by your health care provider. He or she may recommend specific types of exercise, such as: Strengthening exercises. These are done to strengthen the muscles that support joints affected by arthritis. Aerobic activities. These are exercises, such as brisk walking or water aerobics, that increase your heart rate. Range-of-motion activities. These help your joints move more easily. Balance and agility exercises. Managing pain, stiffness, and swelling   If directed, apply heat to the affected area as often as told by your health care provider. Use the heat source that your health care provider recommends, such as a moist heat pack or a heating pad. If you have a removable assistive device, remove it as told by   your health care provider. Place a towel between your skin and the heat source. If your health care provider tells you to keep the assistive device on  while you apply heat, place a towel between the assistive device and the heat source. Leave the heat on for 20-30 minutes. Remove the heat if your skin turns bright red. This is especially important if you are unable to feel pain, heat, or cold. You may have a greater risk of getting burned. If directed, put ice on the affected area. To do this: If you have a removable assistive device, remove it as told by your health care provider. Put ice in a plastic bag. Place a towel between your skin and the bag. If your health care provider tells you to keep the assistive device on during icing, place a towel between the assistive device and the bag. Leave the ice on for 20 minutes, 2-3 times a day. Move your fingers or toes often to reduce stiffness and swelling. Raise (elevate) the injured area above the level of your heart while you are sitting or lying down. General instructions Take over-the-counter and prescription medicines only as told by your health care provider. Maintain a healthy weight. Follow instructions from your health care provider for weight control. Do not use any products that contain nicotine or tobacco, such as cigarettes, e-cigarettes, and chewing tobacco. If you need help quitting, ask your health care provider. Use assistive devices as told by your health care provider. Keep all follow-up visits as told by your health care provider. This is important. Where to find more information National Institute of Arthritis and Musculoskeletal and Skin Diseases: www.niams.nih.gov National Institute on Aging: www.nia.nih.gov American College of Rheumatology: www.rheumatology.org Contact a health care provider if: You have redness, swelling, or a feeling of warmth in a joint that gets worse. You have a fever along with joint or muscle aches. You develop a rash. You have trouble doing your normal activities. Get help right away if: You have pain that gets worse and is not relieved by  pain medicine. Summary Osteoarthritis is a type of arthritis that affects tissue covering the ends of bones in joints (cartilage). This condition is caused by the wearing down of cartilage that covers the ends of bones. The main symptom of this condition is pain, swelling, and stiffness in the joint. There is no cure for this condition, but treatment can help control pain and improve joint function. This information is not intended to replace advice given to you by your health care provider. Make sure you discuss any questions you have with your health care provider. Document Revised: 12/04/2019 Document Reviewed: 12/04/2019 Elsevier Patient Education  2022 Elsevier Inc.  

## 2021-10-08 ENCOUNTER — Other Ambulatory Visit: Payer: Self-pay | Admitting: Physician Assistant

## 2021-10-08 ENCOUNTER — Ambulatory Visit
Admission: RE | Admit: 2021-10-08 | Discharge: 2021-10-08 | Disposition: A | Discharge status: Home / Self Care | Payer: Medicare Other | Source: Ambulatory Visit | Attending: Physician Assistant | Admitting: Physician Assistant

## 2021-10-08 DIAGNOSIS — M25361 Other instability, right knee: Secondary | ICD-10-CM

## 2021-10-08 DIAGNOSIS — M25561 Pain in right knee: Secondary | ICD-10-CM | POA: Diagnosis not present

## 2021-10-10 ENCOUNTER — Other Ambulatory Visit: Payer: Self-pay

## 2021-10-10 DIAGNOSIS — M25361 Other instability, right knee: Secondary | ICD-10-CM

## 2021-10-10 LAB — COMPREHENSIVE METABOLIC PANEL
ALT: 23 IU/L (ref 0–32)
AST: 25 IU/L (ref 0–40)
Albumin/Globulin Ratio: 1.5 (ref 1.2–2.2)
Albumin: 4.8 g/dL (ref 3.8–4.8)
Alkaline Phosphatase: 95 IU/L (ref 44–121)
BUN/Creatinine Ratio: 18 (ref 12–28)
BUN: 14 mg/dL (ref 8–27)
Bilirubin Total: 0.6 mg/dL (ref 0.0–1.2)
CO2: 25 mmol/L (ref 20–29)
Calcium: 10.3 mg/dL (ref 8.7–10.3)
Chloride: 98 mmol/L (ref 96–106)
Creatinine, Ser: 0.78 mg/dL (ref 0.57–1.00)
Globulin, Total: 3.1 g/dL (ref 1.5–4.5)
Glucose: 117 mg/dL — ABNORMAL HIGH (ref 70–99)
Potassium: 4.6 mmol/L (ref 3.5–5.2)
Sodium: 139 mmol/L (ref 134–144)
Total Protein: 7.9 g/dL (ref 6.0–8.5)
eGFR: 82 mL/min/{1.73_m2} (ref >59)

## 2021-10-10 LAB — CBC WITH DIFFERENTIAL/PLATELET
Basophils Absolute: 0 10*3/uL (ref 0.0–0.2)
Basos: 1 %
EOS (ABSOLUTE): 0.1 10*3/uL (ref 0.0–0.4)
Eos: 2 %
Hematocrit: 41.7 % (ref 34.0–46.6)
Hemoglobin: 14.2 g/dL (ref 11.1–15.9)
Immature Grans (Abs): 0 10*3/uL (ref 0.0–0.1)
Immature Granulocytes: 0 %
Lymphocytes Absolute: 1.8 10*3/uL (ref 0.7–3.1)
Lymphs: 29 %
MCH: 28.9 pg (ref 26.6–33.0)
MCHC: 34.1 g/dL (ref 31.5–35.7)
MCV: 85 fL (ref 79–97)
Monocytes Absolute: 0.4 10*3/uL (ref 0.1–0.9)
Monocytes: 6 %
Neutrophils Absolute: 3.9 10*3/uL (ref 1.4–7.0)
Neutrophils: 62 %
Platelets: 181 10*3/uL (ref 150–450)
RBC: 4.92 x10E6/uL (ref 3.77–5.28)
RDW: 11.7 % (ref 11.7–15.4)
WBC: 6.1 10*3/uL (ref 3.4–10.8)

## 2021-10-10 LAB — LIPID PANEL
Chol/HDL Ratio: 2.7 ratio (ref 0.0–4.4)
Cholesterol, Total: 182 mg/dL (ref 100–199)
HDL: 67 mg/dL (ref >39)
LDL Chol Calc (NIH): 100 mg/dL — ABNORMAL HIGH (ref 0–99)
Triglycerides: 83 mg/dL (ref 0–149)
VLDL Cholesterol Cal: 15 mg/dL (ref 5–40)

## 2021-10-10 LAB — VITAMIN D 25 HYDROXY (VIT D DEFICIENCY, FRACTURES): Vit D, 25-Hydroxy: 70.7 ng/mL (ref 30.0–100.0)

## 2021-10-10 LAB — VITAMIN B6: Vitamin B6: 54.9 ug/L (ref 3.4–65.2)

## 2021-10-10 LAB — VITAMIN B12: Vitamin B-12: 675 pg/mL (ref 232–1245)

## 2021-10-17 ENCOUNTER — Encounter: Payer: Self-pay | Admitting: Orthopaedic Surgery

## 2021-10-17 ENCOUNTER — Ambulatory Visit (INDEPENDENT_AMBULATORY_CARE_PROVIDER_SITE_OTHER): Payer: Medicare Other | Admitting: Orthopaedic Surgery

## 2021-10-17 ENCOUNTER — Other Ambulatory Visit: Payer: Self-pay

## 2021-10-17 DIAGNOSIS — G8929 Other chronic pain: Secondary | ICD-10-CM | POA: Diagnosis not present

## 2021-10-17 DIAGNOSIS — M25561 Pain in right knee: Secondary | ICD-10-CM

## 2021-10-17 MED ORDER — BUPIVACAINE HCL 0.5 % IJ SOLN
2.0000 mL | INTRAMUSCULAR | Status: AC | PRN
  Administered 2021-10-17: 2 mL via INTRA_ARTICULAR

## 2021-10-17 MED ORDER — METHYLPREDNISOLONE ACETATE 40 MG/ML IJ SUSP
40.0000 mg | INTRAMUSCULAR | Status: AC | PRN
Start: 2021-10-17 — End: 2021-10-17
  Administered 2021-10-17: 40 mg via INTRA_ARTICULAR

## 2021-10-17 MED ORDER — LIDOCAINE HCL 1 % IJ SOLN
2.0000 mL | INTRAMUSCULAR | Status: AC | PRN
  Administered 2021-10-17: 2 mL

## 2021-10-17 NOTE — Progress Notes (Signed)
Office Visit Note   Patient: Jasmine Davila           Date of Birth: 1951-07-20           MRN: [id number] Visit Date: 10/17/2021              Requested by: Lorrene Reid, PA-C [place],  [place] [id number] PCP: Lorrene Reid, PA-C   Assessment & Plan: Visit Diagnoses:  1. Chronic pain of right knee     Plan: Impression is of chronic right knee pain due to patellofemoral chondromalacia versus degenerative medial meniscal tear.  Based on options we agreed to start with a cortisone injection today and we will see how she does over the next 6weeks with relative rest.  She has my card and she will let me know at that time if she is still symptomatic at which point we would get an MRI.  Questions encouraged and answered.  Follow-Up Instructions: No follow-ups on file.   Orders:  No orders of the defined types were placed in this encounter.  No orders of the defined types were placed in this encounter.     Procedures: Large Joint Inj: R knee on 10/17/2021 10:49 AM Indications: pain Details: 22 G needle  Arthrogram: No  Medications: 40 mg methylPREDNISolone acetate 40 MG/ML; 2 mL lidocaine 1 %; 2 mL bupivacaine 0.5 % Consent was given by the patient. Patient was prepped and draped in the usual sterile fashion.      Clinical Data: No additional findings.   Subjective: Chief Complaint  Patient presents with  . Right Knee - Pain    Jasmine Davila is a very pleasant 70 year old female here for evaluation of right knee pain with giving way for the last 6 months.  She had a fall in the garage about 2 months ago but this did not make her symptoms worse.  She endorses pain to the medial side of the knee.  Denies any swelling.  She recently had an x-ray at the PCPs office which was concerning for a loose body.   Review of Systems  Constitutional: Negative.   HENT: Negative.    Eyes: Negative.   Respiratory: Negative.    Cardiovascular:  Negative.   Endocrine: Negative.   Musculoskeletal: Negative.   Neurological: Negative.   Hematological: Negative.   Psychiatric/Behavioral: Negative.    All other systems reviewed and are negative.   Objective: Vital Signs: LMP 12/21/1998 (Approximate)   Physical Exam Vitals and nursing note reviewed.  Constitutional:      Appearance: She is well-developed.  Pulmonary:     Effort: Pulmonary effort is normal.  Skin:    General: Skin is warm.     Capillary Refill: Capillary refill takes less than 2 seconds.  Neurological:     Mental Status: She is alert and oriented to person, place, and time.  Psychiatric:        Behavior: Behavior normal.        Thought Content: Thought content normal.        Judgment: Judgment normal.    Ortho Exam  Right knee shows no joint effusion.  Mild crepitus through arc of motion.  Slight medial joint line tenderness.  Equivocal McMurray's sign.  Collaterals and cruciates are stable.  Specialty Comments:  No specialty comments available.  Imaging: No results found.   PMFS History: Patient Active Problem List   Diagnosis Date Noted  . Ductal carcinoma in situ (DCIS) of right breast 06/14/2020  .  Acute pancreatitis 06/21/2019  . Pancreatitis 06/21/2019  . Acute diverticulitis 06/21/2019  . Abnormal LFTs 06/21/2019  . Hyperbilirubinemia 06/21/2019  . Left leg swelling 06/21/2019  . Hyponatremia 06/21/2019  . Hypochloremia 06/21/2019  . Obesity 06/21/2019  . Vitamin D insufficiency 04/11/2019  . Lumbar spondylosis 02/28/2019  . Chronic back pain 02/07/2019  . Inflamed acrochordon 02/07/2019  . Acrochordon- over R eye interferring with vision 02/07/2019  . Acute otitis media 11/23/2018  . Acute maxillary sinusitis 11/23/2018  . History of malignant lymphoma 06/03/2018  . Neuropathy 06/03/2018  . Endometrial disorder- trace endometrial canal fluid seen on CT scan recently and in 2016.  No GYN care many yrs, to GYN for TVUS and further  w/up prn 04/20/2018  . Diabetic peripheral neuropathy associated with type 2 diabetes mellitus ([place]) 04/20/2018  . Acute on chronic pancreatitis ([place]) 04/13/2018  . Onychomycosis 03/16/2018  . Neuropathic pain syndrome (non-herpetic) 03/16/2018  . Sleep apnea with CPAP 12/29/2016  . History of decreased platelet count 12/29/2016  . Morbid obesity ([place]) 11/25/2016  . Diabetes mellitus due to underlying condition with diabetic nephropathy, with long-term current use of insulin ([place]) 11/25/2016  . Smoking hx 11/24/2016  . Hypertension associated with diabetes ([place]) 11/24/2016  . Chronic pancreatitis ([place]) 11/24/2016  . 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 Diabetic neuropathy - exaccerbated by chemo 11/24/2016  . GERD (gastroesophageal reflux disease) 11/24/2016  . Hyperlipidemia associated with type 2 diabetes mellitus ([place]) 11/24/2016  . B12 deficiency 11/24/2016  . Vitamin B6 deficiency (non anemic) 11/24/2016   Past Medical History:  Diagnosis Date  . Abnormal EKG    hx of right bundle branch block  . Acute pancreatitis    HX OF  . Anemia yrs ago  . Cancer ([place]) dx 2016   B cell lymphoma- Non Hodgkins in remission  . Diabetes mellitus ([place])    TYPE 2  . Diabetic neuropathy ([place])   . Diabetic neuropathy ([place])   . Endometrial disorder   . GERD (gastroesophageal reflux disease)   . Headache    tension or sinus  . Hypertension   . Malignant lymphoma ([place])   . Morbid obesity ([place])   . Peripheral neuropathy    BOTH FEET, SLIGHT NEUROPATHY IN HANDS  . Sleep apnea    USES CPAP at times  . Vitamin B 12 deficiency     Family History  Problem Relation Age of Onset  . Cancer Mother 17       colon  . Hypertension Mother   . Osteoporosis Mother   . Heart attack Father   . Heart disease Sister   . Alcohol abuse Maternal Grandfather     Past Surgical History:  Procedure Laterality Date  . CHOLECYSTECTOMY  03/06/1996  . colomscopy     . DILATATION & CURETTAGE/HYSTEROSCOPY WITH MYOSURE N/A 07/04/2018   Procedure: DILATATION & CURETTAGE/HYSTEROSCOPY WITH MYOSURE;  Surgeon: Salvadore Dom, MD;  Location: [facility];  Service: Gynecology;  Laterality: N/A;  . MEDIPORT INSERTION, DOUBLE    . MEDIPORT REMOVAL     Social History   Occupational History  . Occupation: retired from [employer]  Tobacco Use  . Smoking status: Former    Packs/day: 0.50    Years: 4.00    Pack years: 2.00    Types: Cigarettes    Quit date: 12/21/1968    Years since quitting: 52.8  . Smokeless tobacco: Never  . Tobacco comments:  as teenager  Vaping Use  . Vaping Use: Never used  Substance and Sexual Activity  . Alcohol use: No  . Drug use: No  . Sexual activity: Not Currently    Birth control/protection: None

## 2021-11-20 ENCOUNTER — Other Ambulatory Visit: Payer: Self-pay | Admitting: Physician Assistant

## 2021-11-20 DIAGNOSIS — E0821 Diabetes mellitus due to underlying condition with diabetic nephropathy: Secondary | ICD-10-CM

## 2021-11-20 DIAGNOSIS — Z794 Long term (current) use of insulin: Secondary | ICD-10-CM

## 2021-11-20 DIAGNOSIS — E1142 Type 2 diabetes mellitus with diabetic polyneuropathy: Secondary | ICD-10-CM

## 2021-11-29 ENCOUNTER — Encounter: Payer: Self-pay | Admitting: Orthopaedic Surgery

## 2021-12-01 ENCOUNTER — Other Ambulatory Visit: Payer: Self-pay

## 2021-12-01 DIAGNOSIS — G8929 Other chronic pain: Secondary | ICD-10-CM

## 2021-12-01 DIAGNOSIS — M25561 Pain in right knee: Secondary | ICD-10-CM

## 2021-12-01 NOTE — Telephone Encounter (Signed)
MRI order made.

## 2021-12-04 ENCOUNTER — Encounter: Payer: Self-pay | Admitting: Physician Assistant

## 2021-12-08 ENCOUNTER — Ambulatory Visit
Admission: RE | Admit: 2021-12-08 | Discharge: 2021-12-08 | Disposition: A | Discharge status: Home / Self Care | Payer: Medicare Other | Source: Ambulatory Visit | Attending: Orthopaedic Surgery | Admitting: Orthopaedic Surgery

## 2021-12-08 ENCOUNTER — Other Ambulatory Visit: Payer: Self-pay

## 2021-12-08 DIAGNOSIS — M25561 Pain in right knee: Secondary | ICD-10-CM | POA: Diagnosis not present

## 2021-12-09 NOTE — Progress Notes (Signed)
Needs appt

## 2021-12-26 ENCOUNTER — Ambulatory Visit: Payer: Medicare Other | Admitting: Orthopaedic Surgery

## 2021-12-26 ENCOUNTER — Telehealth: Payer: Self-pay

## 2021-12-26 ENCOUNTER — Other Ambulatory Visit: Payer: Self-pay

## 2021-12-26 ENCOUNTER — Encounter: Payer: Self-pay | Admitting: Orthopaedic Surgery

## 2021-12-26 DIAGNOSIS — M1711 Unilateral primary osteoarthritis, right knee: Secondary | ICD-10-CM | POA: Diagnosis not present

## 2021-12-26 NOTE — Progress Notes (Signed)
Office Visit Note   Patient: Jasmine Davila           Date of Birth: 1951-09-09           MRN: [id number] Visit Date: 12/26/2021              Requested by: Lorrene Reid, PA-C [place],  [place] [address] PCP: Lorrene Reid, PA-C   Assessment & Plan: Visit Diagnoses:  1. Primary osteoarthritis of right knee     Plan: Zilla returns today to discuss right knee MRI scan.  Prior cortisone injection provided minimal relief which was temporary.  Right knee exam is unchanged.  The MRI basically shows moderate to severe chondromalacia throughout the knee.  No meniscal pathology.  These findings were reviewed with Altha Harm in detail and again treatment options were reviewed.  She is not ready for knee replacement at this time but is interested in trying Visco injection.  We will get approval for that.  This patient is diagnosed with osteoarthritis of the knee(s).    Radiographs show evidence of joint space narrowing, osteophytes, subchondral sclerosis and/or subchondral cysts.  This patient has knee pain which interferes with functional and activities of daily living.    This patient has experienced inadequate response, adverse effects and/or intolerance with conservative treatments such as acetaminophen, NSAIDS, topical creams, physical therapy or regular exercise, knee bracing and/or weight loss.   This patient has experienced inadequate response or has a contraindication to intra articular steroid injections for at least 3 months.   This patient is not scheduled to have a total knee replacement within 6 months of starting treatment with viscosupplementation.  Follow-Up Instructions: No follow-ups on file.   Orders:  No orders of the defined types were placed in this encounter.  No orders of the defined types were placed in this encounter.     Procedures: No procedures performed   Clinical Data: No additional  findings.   Subjective: Chief Complaint  Patient presents with   Right Knee - Pain    HPI  Review of Systems   Objective: Vital Signs: LMP 12/21/1998 (Approximate)   Physical Exam  Ortho Exam  Specialty Comments:  No specialty comments available.  Imaging: No results found.   PMFS History: Patient Active Problem List   Diagnosis Date Noted   Ductal carcinoma in situ (DCIS) of right breast 06/14/2020   Acute pancreatitis 06/21/2019   Pancreatitis 06/21/2019   Acute diverticulitis 06/21/2019   Abnormal LFTs 06/21/2019   Hyperbilirubinemia 06/21/2019   Left leg swelling 06/21/2019   Hyponatremia 06/21/2019   Hypochloremia 06/21/2019   Obesity 06/21/2019   Vitamin D insufficiency 04/11/2019   Lumbar spondylosis 02/28/2019   Chronic back pain 02/07/2019   Inflamed acrochordon 02/07/2019   Acrochordon- over R eye interferring with vision 02/07/2019   Acute otitis media 11/23/2018   Acute maxillary sinusitis 11/23/2018   History of malignant lymphoma 06/03/2018   Neuropathy 06/03/2018   Endometrial disorder- trace endometrial canal fluid seen on CT scan recently and in 2016.  No GYN care many yrs, to GYN for TVUS and further w/up prn 04/20/2018   Diabetic peripheral neuropathy associated with type 2 diabetes mellitus ([place]) 04/20/2018   Acute on chronic pancreatitis ([place]) 04/13/2018   Onychomycosis 03/16/2018   Neuropathic pain syndrome (non-herpetic) 03/16/2018   Sleep apnea with CPAP 12/29/2016   History of decreased platelet count 12/29/2016   Morbid obesity ([place]) 11/25/2016   Diabetes mellitus due to underlying condition  with diabetic nephropathy, with long-term current use of insulin ([place]) 11/25/2016   Smoking hx 11/24/2016   Hypertension associated with diabetes ([place]) 11/24/2016   Chronic pancreatitis ([place]) 11/24/2016   H/o Cancer:   B- cell lymphoma (spleen and lung) - non-Hodgkin's 11/24/2016   Diabetic neuropathy - exaccerbated by chemo 11/24/2016    GERD (gastroesophageal reflux disease) 11/24/2016   Hyperlipidemia associated with type 2 diabetes mellitus ([place]) 11/24/2016   B12 deficiency 11/24/2016   Vitamin B6 deficiency (non anemic) 11/24/2016   Past Medical History:  Diagnosis Date   Abnormal EKG    hx of right bundle branch block   Acute pancreatitis    HX OF   Anemia yrs ago   Cancer ([facility]) dx 2016   B cell lymphoma- Non Hodgkins in remission   Diabetes mellitus (HCC)    TYPE 2   Diabetic neuropathy (HCC)    Diabetic neuropathy (HCC)    Endometrial disorder    GERD (gastroesophageal reflux disease)    Headache    tension or sinus   Hypertension    Malignant lymphoma (HCC)    Morbid obesity ([place])    Peripheral neuropathy    BOTH FEET, SLIGHT NEUROPATHY IN HANDS   Sleep apnea    USES CPAP at times   Vitamin B 12 deficiency     Family History  Problem Relation Age of Onset   Cancer Mother 64       colon   Hypertension Mother    Osteoporosis Mother    Heart attack Father    Heart disease Sister    Alcohol abuse Maternal Grandfather     Past Surgical History:  Procedure Laterality Date   CHOLECYSTECTOMY  03/06/1996   colomscopy     DILATATION & CURETTAGE/HYSTEROSCOPY WITH MYOSURE N/A 07/04/2018   Procedure: DILATATION & CURETTAGE/HYSTEROSCOPY WITH MYOSURE;  Surgeon: Salvadore Dom, MD;  Location: [facility];  Service: Gynecology;  Laterality: N/A;   MEDIPORT INSERTION, DOUBLE     MEDIPORT REMOVAL     Social History   Occupational History   Occupation: retired from medical billing  Tobacco Use   Smoking status: Former    Packs/day: 0.50    Years: 4.00    Pack years: 2.00    Types: Cigarettes    Quit date: 12/21/1968    Years since quitting: 53.0   Smokeless tobacco: Never   Tobacco comments:    as teenager  Vaping Use   Vaping Use: Never used  Substance and Sexual Activity   Alcohol use: No   Drug use: No   Sexual activity: Not Currently    Birth control/protection: None

## 2021-12-26 NOTE — Telephone Encounter (Signed)
Please submit for right knee gel inj 

## 2021-12-29 ENCOUNTER — Other Ambulatory Visit: Payer: Self-pay

## 2021-12-29 DIAGNOSIS — K219 Gastro-esophageal reflux disease without esophagitis: Secondary | ICD-10-CM

## 2021-12-30 NOTE — Telephone Encounter (Signed)
Noted  

## 2021-12-31 ENCOUNTER — Encounter: Payer: Self-pay | Admitting: Physician Assistant

## 2021-12-31 DIAGNOSIS — K219 Gastro-esophageal reflux disease without esophagitis: Secondary | ICD-10-CM

## 2021-12-31 MED ORDER — FAMOTIDINE 20 MG PO TABS: 20.0000 mg | ORAL_TABLET | Freq: Two times a day (BID) | ORAL | 0 refills | Status: DC

## 2021-12-31 MED ORDER — LOSARTAN POTASSIUM 25 MG PO TABS: 25.0000 mg | ORAL_TABLET | Freq: Every day | ORAL | 0 refills | Status: DC

## 2022-01-02 ENCOUNTER — Telehealth: Payer: Self-pay

## 2022-01-02 NOTE — Telephone Encounter (Signed)
VOB submitted for  Durolane, right knee. °BV pending. °

## 2022-01-06 ENCOUNTER — Other Ambulatory Visit: Payer: Self-pay | Admitting: Physician Assistant

## 2022-01-06 DIAGNOSIS — E1142 Type 2 diabetes mellitus with diabetic polyneuropathy: Secondary | ICD-10-CM

## 2022-01-06 MED ORDER — GABAPENTIN 600 MG PO TABS: 1200.0000 mg | ORAL_TABLET | Freq: Three times a day (TID) | ORAL | 0 refills | Status: DC

## 2022-01-07 ENCOUNTER — Ambulatory Visit (INDEPENDENT_AMBULATORY_CARE_PROVIDER_SITE_OTHER): Payer: Medicare Other | Admitting: Physician Assistant

## 2022-01-07 ENCOUNTER — Telehealth: Payer: Self-pay

## 2022-01-07 ENCOUNTER — Other Ambulatory Visit: Payer: Self-pay

## 2022-01-07 ENCOUNTER — Encounter: Payer: Self-pay | Admitting: Physician Assistant

## 2022-01-07 VITALS — BP 138/70 | HR 71 | Ht 67.0 in | Wt 230.0 lb

## 2022-01-07 DIAGNOSIS — Z0000 Encounter for general adult medical examination without abnormal findings: Secondary | ICD-10-CM

## 2022-01-07 NOTE — Telephone Encounter (Signed)
Approved for Monovisc, right knee. Buy & Bill Covered at 100% through [employer], [employer] No Co-pay No PA required  Appt. 01/13/2022 with Dr. Erlinda Hong.

## 2022-01-07 NOTE — Progress Notes (Signed)
Virtual Visit via Telephone Note:  I connected with Jasmine Davila by telephone and verified that I am speaking with the correct person using two identifiers.    I discussed the limitations, risks, security and privacy concerns for performing an evaluation and management service by telephone and the availability of in person appointments. The staff discussed with patient that there may be a patient responsible charge related to this service. The patient expressed understanding and agreed to proceed.   Location of Patient- Home Location of Provider- Office    Subjective:   Jasmine Davila is a 71 y.o. female who presents for Medicare Annual (Subsequent) preventive examination.  Review of Systems    General:   No F/C, wt loss Pulm:   No DIB, SOB, pleuritic chest pain Card:  No CP, palpitations Abd:  No n/v/d or pain Ext:  No inc edema from baseline    Objective:    Today's Vitals   01/07/22 1015  BP: 138/70  Pulse: 71  Weight: 230 lb (104.3 kg)  Height: 5\' 7"  (1.702 m)   Body mass index is 36.02 kg/m.  Advanced Directives 06/21/2019 06/21/2019 10/19/2018 10/18/2018 06/30/2018 04/13/2018 04/13/2018  Does Patient Have a Medical Advance Directive? No No No No Yes No No  Type of Advance Directive - - - - Press photographer;Living will - -  Copy of [place] in Chart? - - - - No - copy requested - -  Would patient like information on creating a medical advance directive? No - Patient declined - No - Patient declined - - No - Patient declined No - Patient declined    Current Medications (verified) Outpatient Encounter Medications as of 01/07/2022  Medication Sig   aspirin EC 81 MG tablet Take 81 mg by mouth at bedtime.    Cholecalciferol (VITAMIN D-3) 5000 units TABS Take 1 tablet by mouth daily.   Continuous Blood Gluc Receiver (DEXCOM G6 RECEIVER) DEVI Use to check blood sugars every morning fasting and 2 hours after largest meal   Continuous  Blood Gluc Sensor (DEXCOM G6 SENSOR) MISC Apply new sensor every 10 days.  Use to check blood sugars every morning fasting and 2 hours after largest meal   Continuous Blood Gluc Transmit (DEXCOM G6 TRANSMITTER) MISC Use to check blood sugars every morning fasting and 2 hours after largest meal   famotidine (PEPCID) 20 MG tablet Take 1 tablet (20 mg total) by mouth 2 (two) times daily.   fluticasone (FLONASE) 50 MCG/ACT nasal spray PLACE 1 SPRAY INTO BOTH NOSTRILS DAILY AS NEEDED FOR ALLERGIES OR RHINITIS.   folic acid (FOLVITE) 001 MCG tablet Take 400 mcg by mouth daily.   gabapentin (NEURONTIN) 600 MG tablet Take 2 tablets (1,200 mg total) by mouth 3 (three) times daily.   insulin lispro (HUMALOG KWIKPEN) 100 UNIT/ML KwikPen INJECT 20 UNITS 3 TIMES A DAY WITH MEALS   Insulin Pen Needle (B-D UF III MINI PEN NEEDLES) 31G X 5 MM MISC Use as directed.   LANTUS SOLOSTAR 100 UNIT/ML Solostar Pen INJECT 70 UNITS INTO THE SKIN DAILY.   losartan (COZAAR) 25 MG tablet Take 1 tablet (25 mg total) by mouth daily.   Multiple Vitamin (MULTIVITAMIN) tablet Take 1 tablet by mouth daily.   niacin (NIASPAN) 1000 MG CR tablet Take 1 tablet (1,000 mg total) by mouth at bedtime. An hour after 81 mg ASA   No facility-administered encounter medications on file as of 01/07/2022.    Allergies (verified) Dilaudid [hydromorphone  hcl], Doxycycline, Metronidazole, and Pseudoephedrine hcl   History: Past Medical History:  Diagnosis Date   Abnormal EKG    hx of right bundle branch block   Acute pancreatitis    HX OF   Anemia yrs ago   Cancer ([facility]) dx 2016   B cell lymphoma- Non Hodgkins in remission   Diabetes mellitus ([place])    TYPE 2   Diabetic neuropathy (HCC)    Diabetic neuropathy (HCC)    Endometrial disorder    GERD (gastroesophageal reflux disease)    Headache    tension or sinus   Hypertension    Malignant lymphoma ([facility])    Morbid obesity ([place])    Peripheral neuropathy    BOTH FEET, SLIGHT  NEUROPATHY IN HANDS   Sleep apnea    USES CPAP at times   Vitamin B 12 deficiency    Past Surgical History:  Procedure Laterality Date   CHOLECYSTECTOMY  03/06/1996   colomscopy     DILATATION & CURETTAGE/HYSTEROSCOPY WITH MYOSURE N/A 07/04/2018   Procedure: DILATATION & CURETTAGE/HYSTEROSCOPY WITH MYOSURE;  Surgeon: Salvadore Dom, MD;  Location: [facility];  Service: Gynecology;  Laterality: N/A;   MEDIPORT INSERTION, DOUBLE     MEDIPORT REMOVAL     Family History  Problem Relation Age of Onset   Cancer Mother 51       colon   Hypertension Mother    Osteoporosis Mother    Heart attack Father    Heart disease Sister    Alcohol abuse Maternal Grandfather    Social History   Socioeconomic History   Marital status: Married    Spouse name: Not on file   Number of children: 4   Years of education: 12   Highest education level: Not on file  Occupational History   Occupation: retired from medical billing  Tobacco Use   Smoking status: Former    Packs/day: 0.50    Years: 4.00    Pack years: 2.00    Types: Cigarettes    Quit date: 12/21/1968    Years since quitting: 53.0   Smokeless tobacco: Never   Tobacco comments:    as teenager  Vaping Use   Vaping Use: Never used  Substance and Sexual Activity   Alcohol use: No   Drug use: No   Sexual activity: Not Currently    Birth control/protection: None  Other Topics Concern   Not on file  Social History Narrative   Lives with husband in a one story home.  Has 3 living children.  One child died in a MVA.  Retired from [employer].     Education: high school.   Social Determinants of Health   Financial Resource Strain: Not on file  Food Insecurity: Not on file  Transportation Needs: Not on file  Physical Activity: Not on file  Stress: Not on file  Social Connections: Not on file    Tobacco Counseling Counseling given: Not Answered Tobacco comments: as teenager     Diabetic?yes       Activities of Daily Living In your present state of health, do you have any difficulty performing the following activities: 10/07/2021 07/07/2021  Hearing? N N  Vision? N N  Difficulty concentrating or making decisions? N N  Walking or climbing stairs? N N  Dressing or bathing? N N  Doing errands, shopping? N N  Some recent data might be hidden    Patient Care Team: Lorrene Reid, PA-C as PCP - Perlie Mayo,  Chinita Pester, MD as Consulting Physician (Oncology) Clarene Essex, MD as Consulting Physician (Gastroenterology) Alda Berthold, DO as Consulting Physician (Neurology) Edrick Kins, DPM as Consulting Physician (Podiatry) Care, Holstein Better (Family Medicine) Arta Silence, MD as Consulting Physician (Gastroenterology) Mont Dutton, Murray Hodgkins, MD as Referring Physician (Gastroenterology)  Indicate any recent Medical Services you may have received from other than [facility] providers in the past year (date may be approximate).     Assessment:   This is a routine wellness examination for [place].  Hearing/Vision screen No results found.  Dietary issues and exercise activities discussed: -Continue with heart healthy diet low in fat and carbohydrates. Fasting sugars remain stable (reports range from 90s-120s). Continue to stay as active as possible. Physical activity limited by right knee OA.   Goals Addressed   None   Depression Screen PHQ 2/9 Scores 10/07/2021 07/07/2021 02/19/2021 01/15/2021 09/13/2020 06/14/2020 03/14/2020  PHQ - 2 Score 0 0 0 0 0 0 0  PHQ- 9 Score 0 1 0 1 0 1 0    Fall Risk Fall Risk  10/07/2021 07/07/2021 02/19/2021 01/15/2021 09/13/2020  Falls in the past year? 1 0 0 0 0  Number falls in past yr: 0 0 - - -  Injury with Fall? 0 0 - - -  Risk for fall due to : History of fall(s) No Fall Risks - - -  Follow up Falls evaluation completed Falls evaluation completed Falls evaluation completed Falls evaluation completed Falls evaluation  completed    [place]:  Any stairs in or around the home? Yes  If so, are there any without handrails? Yes , two steps to garage  Home free of loose throw rugs in walkways, pet beds, electrical cords, etc? Yes  Adequate lighting in your home to reduce risk of falls? No   ASSISTIVE DEVICES UTILIZED TO PREVENT FALLS:  Life alert? No  Use of a cane, walker or w/c? No  Grab bars in the bathroom? Yes  Shower chair or bench in shower? Yes  Elevated toilet seat or a handicapped toilet? Yes   TIMED UP AND GO:  Was the test performed? No . Telehealth  Length of time to ambulate 10 feet: 0 sec.     Cognitive Function:   6CIT Screen 01/07/2022 01/15/2021 04/25/2019  What Year? 0 points 0 points 0 points  What month? 0 points 0 points 0 points  What time? 0 points 0 points 0 points  Count back from 20 0 points 0 points 0 points  Months in reverse 0 points 0 points 0 points  Repeat phrase 0 points 0 points 0 points  Total Score 0 0 0    Immunizations Immunization History  Administered Date(s) Administered   Fluad Quad(high Dose 65+) 09/21/2019, 10/24/2020, 10/07/2021   Influenza, High Dose Seasonal PF 11/04/2018   Influenza,inj,Quad PF,6+ Mos 11/24/2016   Influenza-Unspecified 11/24/2016, 08/31/2017   Moderna Sars-Covid-2 Vaccination 02/03/2020, 03/01/2020   Pneumococcal Conjugate-13 09/21/2015    TDAP status: Due, Education has been provided regarding the importance of this vaccine. Advised may receive this vaccine at local pharmacy or Health Dept. Aware to provide a copy of the vaccination record if obtained from local pharmacy or Health Dept. Verbalized acceptance and understanding.  Flu Vaccine status: Up to date  Pneumococcal vaccine status: Due, Education has been provided regarding the importance of this vaccine. Advised may receive this vaccine at local pharmacy or Health Dept. Aware to provide a copy of  the vaccination record if obtained  from local pharmacy or Health Dept. Verbalized acceptance and understanding.  Covid-19 vaccine status: Completed vaccines  Qualifies for Shingles Vaccine? Yes   Zostavax completed No   Shingrix Completed?: No.    Education has been provided regarding the importance of this vaccine. Patient has been advised to call insurance company to determine out of pocket expense if they have not yet received this vaccine. Advised may also receive vaccine at local pharmacy or Health Dept. Verbalized acceptance and understanding.  Screening Tests Health Maintenance  Topic Date Due   Hepatitis C Screening  Never done   TETANUS/TDAP  Never done   Zoster Vaccines- Shingrix (1 of 2) Never done   Pneumonia Vaccine 31+ Years old (2 - PPSV23 if available, else PCV20) 09/20/2016   COVID-19 Vaccine (3 - Moderna risk series) 03/29/2020   OPHTHALMOLOGY EXAM  03/27/2021   HEMOGLOBIN A1C  04/07/2022   MAMMOGRAM  05/27/2022   FOOT EXAM  07/07/2022   COLONOSCOPY (Pts 45-10yrs Insurance coverage will need to be confirmed)  11/02/2029   INFLUENZA VACCINE  Completed   DEXA SCAN  Completed   HPV VACCINES  Aged Out    Health Maintenance  Health Maintenance Due  Topic Date Due   Hepatitis C Screening  Never done   TETANUS/TDAP  Never done   Zoster Vaccines- Shingrix (1 of 2) Never done   Pneumonia Vaccine 61+ Years old (2 - PPSV23 if available, else PCV20) 09/20/2016   COVID-19 Vaccine (3 - Moderna risk series) 03/29/2020   OPHTHALMOLOGY EXAM  03/27/2021    Colorectal cancer screening: Type of screening: Colonoscopy. Completed 11/03/2019. Repeat every 5 years  Mammogram status: Completed 05/27/2020. Repeat every year  Bone Density status: Completed 06/15/2019. Results reflect: Bone density results: NORMAL. Repeat every 2 years.  Lung Cancer Screening: (Low Dose CT Chest recommended if Age 12-80 years, 30 pack-year currently smoking OR have quit w/in 15years.) does not qualify.   Lung Cancer Screening  Referral: n/a  Additional Screening:  Hepatitis C Screening: does qualify; Declined screening   Vision Screening: Recommended annual ophthalmology exams for early detection of glaucoma and other disorders of the eye. Is the patient up to date with their annual eye exam?  No, scheduled for March.  Who is the provider or what is the name of the office in which the patient attends annual eye exams? Dr. Posey Pronto If pt is not established with a provider, would they like to be referred to a provider to establish care? No .   Dental Screening: Recommended annual dental exams for proper oral hygiene  Community Resource Referral / Chronic Care Management: CRR required this visit?  No   CCM required this visit?  No      Plan:  -Continue to follow up with various specialists. -Continue current medication regimen. -Recommend repeating fasting labs at follow up visit. -Follow up in 4 months for DM, HTN, OSA  I have personally reviewed and noted the following in the patients chart:   Medical and social history Use of alcohol, tobacco or illicit drugs  Current medications and supplements including opioid prescriptions.  Functional ability and status Nutritional status Physical activity Advanced directives List of other physicians Hospitalizations, surgeries, and ER visits in previous 12 months Vitals Screenings to include cognitive, depression, and falls Referrals and appointments  In addition, I have reviewed and discussed with patient certain preventive protocols, quality metrics, and best practice recommendations. A written personalized care plan for preventive services as  well as general preventive health recommendations were provided to patient.    Lorrene Reid, PA-C   01/07/2022

## 2022-01-07 NOTE — Patient Instructions (Signed)
Preventive Care 65 Years and Older, Female °Preventive care refers to lifestyle choices and visits with your health care provider that can promote health and wellness. Preventive care visits are also called wellness exams. °What can I expect for my preventive care visit? °Counseling °Your health care provider may ask you questions about your: °Medical history, including: °Past medical problems. °Family medical history. °Pregnancy and menstrual history. °History of falls. °Current health, including: °Memory and ability to understand (cognition). °Emotional well-being. °Home life and relationship well-being. °Sexual activity and sexual health. °Lifestyle, including: °Alcohol, nicotine or tobacco, and drug use. °Access to firearms. °Diet, exercise, and sleep habits. °Work and work environment. °Sunscreen use. °Safety issues such as seatbelt and bike helmet use. °Physical exam °Your health care provider will check your: °Height and weight. These may be used to calculate your BMI (body mass index). BMI is a measurement that tells if you are at a healthy weight. °Waist circumference. This measures the distance around your waistline. This measurement also tells if you are at a healthy weight and may help predict your risk of certain diseases, such as type 2 diabetes and high blood pressure. °Heart rate and blood pressure. °Body temperature. °Skin for abnormal spots. °What immunizations do I need? °Vaccines are usually given at various ages, according to a schedule. Your health care provider will recommend vaccines for you based on your age, medical history, and lifestyle or other factors, such as travel or where you work. °What tests do I need? °Screening °Your health care provider may recommend screening tests for certain conditions. This may include: °Lipid and cholesterol levels. °Hepatitis C test. °Hepatitis B test. °HIV (human immunodeficiency virus) test. °STI (sexually transmitted infection) testing, if you are at  risk. °Lung cancer screening. °Colorectal cancer screening. °Diabetes screening. This is done by checking your blood sugar (glucose) after you have not eaten for a while (fasting). °Mammogram. Talk with your health care provider about how often you should have regular mammograms. °BRCA-related cancer screening. This may be done if you have a family history of breast, ovarian, tubal, or peritoneal cancers. °Bone density scan. This is done to screen for osteoporosis. °Talk with your health care provider about your test results, treatment options, and if necessary, the need for more tests. °Follow these instructions at home: °Eating and drinking ° °Eat a diet that includes fresh fruits and vegetables, whole grains, lean protein, and low-fat dairy products. Limit your intake of foods with high amounts of sugar, saturated fats, and salt. °Take vitamin and mineral supplements as recommended by your health care provider. °Do not drink alcohol if your health care provider tells you not to drink. °If you drink alcohol: °Limit how much you have to 0-1 drink a day. °Know how much alcohol is in your drink. In the [place] one drink equals one 12 oz bottle of beer (355 mL), one 5 oz glass of wine (148 mL), or one 1½ oz glass of hard liquor (44 mL). °Lifestyle °Brush your teeth every morning and night with fluoride toothpaste. Floss one time each day. °Exercise for at least 30 minutes 5 or more days each week. °Do not use any products that contain nicotine or tobacco. These products include cigarettes, chewing tobacco, and vaping devices, such as e-cigarettes. If you need help quitting, ask your health care provider. °Do not use drugs. °If you are sexually active, practice safe sex. Use a condom or other form of protection in order to prevent STIs. °Take aspirin only as told by your   health care provider. Make sure that you understand how much to take and what form to take. Work with your health care provider to find out whether it  is safe and beneficial for you to take aspirin daily. Ask your health care provider if you need to take a cholesterol-lowering medicine (statin). Find healthy ways to manage stress, such as: Meditation, yoga, or listening to music. Journaling. Talking to a trusted person. Spending time with friends and family. Minimize exposure to UV radiation to reduce your risk of skin cancer. Safety Always wear your seat belt while driving or riding in a vehicle. Do not drive: If you have been drinking alcohol. Do not ride with someone who has been drinking. When you are tired or distracted. While texting. If you have been using any mind-altering substances or drugs. Wear a helmet and other protective equipment during sports activities. If you have firearms in your house, make sure you follow all gun safety procedures. What's next? Visit your health care provider once a year for an annual wellness visit. Ask your health care provider how often you should have your eyes and teeth checked. Stay up to date on all vaccines. This information is not intended to replace advice given to you by your health care provider. Make sure you discuss any questions you have with your health care provider. Document Revised: 06/04/2021 Document Reviewed: 06/04/2021 Elsevier Patient Education  [place].

## 2022-01-13 ENCOUNTER — Ambulatory Visit (INDEPENDENT_AMBULATORY_CARE_PROVIDER_SITE_OTHER): Payer: Medicare Other | Admitting: Orthopaedic Surgery

## 2022-01-13 ENCOUNTER — Encounter: Payer: Self-pay | Admitting: Orthopaedic Surgery

## 2022-01-13 ENCOUNTER — Ambulatory Visit: Payer: Medicare Other | Admitting: Orthopaedic Surgery

## 2022-01-13 ENCOUNTER — Other Ambulatory Visit: Payer: Self-pay

## 2022-01-13 VITALS — Ht 67.0 in | Wt 230.0 lb

## 2022-01-13 DIAGNOSIS — M1711 Unilateral primary osteoarthritis, right knee: Secondary | ICD-10-CM

## 2022-01-13 NOTE — Progress Notes (Signed)
Office Visit Note   Patient: Jasmine Davila           Date of Birth: 1951/11/21           MRN: [id number] Visit Date: 01/13/2022              Requested by: Lorrene Reid, PA-C [address] [place],  [place] [address] PCP: Lorrene Reid, PA-C   Assessment & Plan: Visit Diagnoses:  1. Unilateral primary osteoarthritis, right knee     Plan: Impression is right knee osteoarthritis.  Today we proceeded with Durolane injection to the right knee.  She tolerated this well.  She will follow-up with [place] as needed.  Follow-Up Instructions: Return if symptoms worsen or fail to improve.   Orders:  No orders of the defined types were placed in this encounter.  No orders of the defined types were placed in this encounter.     Procedures: Large Joint Inj: R knee on 01/13/2022 8:59 AM Indications: pain Details: 22 G needle, anterolateral approach Medications: 2 mL lidocaine 1 %; 2 mL bupivacaine 0.25 %; 60 mg Sodium Hyaluronate 60 MG/3ML     Clinical Data: No additional findings.   Subjective: Chief Complaint  Patient presents with   Right Knee - Follow-up    Durolane Injection    HPI patient is a pleasant 71 year old female who comes in today with right knee osteoarthritis.  She is previously undergone cortisone injections without relief.  No previous viscosupplementation injection performed.     Objective: Vital Signs: Ht 5\' 7"  (1.702 m)    Wt 230 lb (104.3 kg)    LMP 12/21/1998 (Approximate)    BMI 36.02 kg/m     Ortho Exam stable right knee exam  Specialty Comments:  No specialty comments available.  Imaging: No new imaging   PMFS History: Patient Active Problem List   Diagnosis Date Noted   Ductal carcinoma in situ (DCIS) of right breast 06/14/2020   Acute pancreatitis 06/21/2019   Pancreatitis 06/21/2019   Acute diverticulitis 06/21/2019   Abnormal LFTs 06/21/2019   Hyperbilirubinemia 06/21/2019   Left leg swelling 06/21/2019    Hyponatremia 06/21/2019   Hypochloremia 06/21/2019   Obesity 06/21/2019   Vitamin D insufficiency 04/11/2019   Lumbar spondylosis 02/28/2019   Chronic back pain 02/07/2019   Inflamed acrochordon 02/07/2019   Acrochordon- over R eye interferring with vision 02/07/2019   Acute otitis media 11/23/2018   Acute maxillary sinusitis 11/23/2018   History of malignant lymphoma 06/03/2018   Neuropathy 06/03/2018   Endometrial disorder- trace endometrial canal fluid seen on CT scan recently and in 2016.  No GYN care many yrs, to GYN for TVUS and further w/up prn 04/20/2018   Diabetic peripheral neuropathy associated with type 2 diabetes mellitus ([facility] [facility]) 04/20/2018   Acute on chronic pancreatitis ([facility]) 04/13/2018   Onychomycosis 03/16/2018   Neuropathic pain syndrome (non-herpetic) 03/16/2018   Sleep apnea with CPAP 12/29/2016   History of decreased platelet count 12/29/2016   Morbid obesity ([facility]) 11/25/2016   Diabetes mellitus due to underlying condition with diabetic nephropathy, with long-term current use of insulin ([place] [facility]) 11/25/2016   Smoking hx 11/24/2016   Hypertension associated with diabetes ([place]) 11/24/2016   Chronic pancreatitis ([place] [facility] [facility]) 11/24/2016   H/o Cancer:   B- cell lymphoma (spleen and lung) - non-Hodgkin's 11/24/2016   Diabetic neuropathy - exaccerbated by chemo 11/24/2016   GERD (gastroesophageal reflux disease) 11/24/2016   Hyperlipidemia associated with type 2 diabetes mellitus ([facility]) 11/24/2016  B12 deficiency 11/24/2016   Vitamin B6 deficiency (non anemic) 11/24/2016   Past Medical History:  Diagnosis Date   Abnormal EKG    hx of right bundle branch block   Acute pancreatitis    HX OF   Anemia yrs ago   Cancer ([facility]) dx 2016   B cell lymphoma- Non Hodgkins in remission   Diabetes mellitus (HCC)    TYPE 2   Diabetic neuropathy (HCC)    Diabetic neuropathy (HCC)    Endometrial disorder    GERD (gastroesophageal reflux disease)    Headache    tension or sinus    Hypertension    Malignant lymphoma (HCC)    Morbid obesity (HCC)    Peripheral neuropathy    BOTH FEET, SLIGHT NEUROPATHY IN HANDS   Sleep apnea    USES CPAP at times   Vitamin B 12 deficiency     Family History  Problem Relation Age of Onset   Cancer Mother 64       colon   Hypertension Mother    Osteoporosis Mother    Heart attack Father    Heart disease Sister    Alcohol abuse Maternal Grandfather     Past Surgical History:  Procedure Laterality Date   CHOLECYSTECTOMY  03/06/1996   colomscopy     DILATATION & CURETTAGE/HYSTEROSCOPY WITH MYOSURE N/A 07/04/2018   Procedure: DILATATION & CURETTAGE/HYSTEROSCOPY WITH MYOSURE;  Surgeon: Salvadore Dom, MD;  Location: [facility];  Service: Gynecology;  Laterality: N/A;   MEDIPORT INSERTION, DOUBLE     MEDIPORT REMOVAL     Social History   Occupational History   Occupation: retired from medical billing  Tobacco Use   Smoking status: Former    Packs/day: 0.50    Years: 4.00    Pack years: 2.00    Types: Cigarettes    Quit date: 12/21/1968    Years since quitting: 53.0   Smokeless tobacco: Never   Tobacco comments:    as teenager  Vaping Use   Vaping Use: Never used  Substance and Sexual Activity   Alcohol use: No   Drug use: No   Sexual activity: Not Currently    Birth control/protection: None

## 2022-01-14 MED ORDER — LIDOCAINE HCL 1 % IJ SOLN
2.0000 mL | INTRAMUSCULAR | Status: AC | PRN
  Administered 2022-01-13: 09:00:00 2 mL

## 2022-01-14 MED ORDER — SODIUM HYALURONATE 60 MG/3ML IX PRSY
60.0000 mg | PREFILLED_SYRINGE | INTRA_ARTICULAR | Status: AC | PRN
  Administered 2022-01-13: 09:00:00 60 mg via INTRA_ARTICULAR

## 2022-01-14 MED ORDER — BUPIVACAINE HCL 0.25 % IJ SOLN
2.0000 mL | INTRAMUSCULAR | Status: AC | PRN
  Administered 2022-01-13: 09:00:00 2 mL via INTRA_ARTICULAR

## 2022-01-26 ENCOUNTER — Encounter: Payer: Self-pay | Admitting: Orthopaedic Surgery

## 2022-01-28 ENCOUNTER — Other Ambulatory Visit: Payer: Self-pay

## 2022-01-28 ENCOUNTER — Ambulatory Visit: Payer: Medicare Other | Admitting: Orthopaedic Surgery

## 2022-01-28 DIAGNOSIS — M1711 Unilateral primary osteoarthritis, right knee: Secondary | ICD-10-CM | POA: Diagnosis not present

## 2022-01-28 DIAGNOSIS — M1712 Unilateral primary osteoarthritis, left knee: Secondary | ICD-10-CM

## 2022-01-28 NOTE — Progress Notes (Signed)
Office Visit Note   Patient: Jasmine Davila           Date of Birth: 03/03/1951           MRN: [id number] Visit Date: 01/28/2022              Requested by: Lorrene Reid, PA-C [address] [place],  [place] [address] PCP: Lorrene Reid, PA-C   Assessment & Plan: Visit Diagnoses:  1. Primary osteoarthritis of right knee     Plan: Impression is right knee osteoarthritis.  The patient has tried oral anti-inflammatories, cortisone injection as well as viscosupplementation injection all with temporary relief of symptoms.  Her pain has started to affect her ADLs and is limiting her sleep at night.  She would like to proceed with right total knee replacement at this time.  Risk, benefits and poss complications reviewed.  Rehab recovery time discussed.  All questions were answered.  She is a type II diabetic will obtain an updated hemoglobin A1c today.  We will have Debbie call her to schedule the surgery.  Follow-Up Instructions: Return for post-op.   Orders:  Orders Placed This Encounter  Procedures   Hemoglobin A1C   Extra Specimen   No orders of the defined types were placed in this encounter.     Procedures: No procedures performed   Clinical Data: No additional findings.   Subjective: Chief Complaint  Patient presents with   Right Knee - Pain    HPI patient is a pleasant 71 year old female who comes in today with chronic right knee pain.  History of advanced tricompartmental osteoarthritis.  She has been seen by [place] multiple times where she has undergone cortisone as well as viscosupplementation injections.  Cortisone injection was performed October 2022 which provided 2 days relief.  Subsequent viscosupplementation injection performed on 01/13/2022 which provided 12 days relief.  She continues to have pain throughout the entire right knee worse with ambulation and activity.  She is also having pain at night.  This is really starting to affect her  ADLs.  She is at the point of requesting total knee arthroplasty.  Review of Systems as detailed in HPI.  All others reviewed and are negative.   Objective: Vital Signs: LMP 12/21/1998 (Approximate)   Physical Exam well-developed well-nourished female no acute distress.  Alert and oriented x3.  Ortho Exam right knee exam shows no effusion.  Range of motion 0 to 110 degrees.  Medial joint line tenderness.  She is neurovascular intact distally.  Specialty Comments:  No specialty comments available.  Imaging: No new imaging   PMFS History: Patient Active Problem List   Diagnosis Date Noted   Ductal carcinoma in situ (DCIS) of right breast 06/14/2020   Acute pancreatitis 06/21/2019   Pancreatitis 06/21/2019   Acute diverticulitis 06/21/2019   Abnormal LFTs 06/21/2019   Hyperbilirubinemia 06/21/2019   Left leg swelling 06/21/2019   Hyponatremia 06/21/2019   Hypochloremia 06/21/2019   Obesity 06/21/2019   Vitamin D insufficiency 04/11/2019   Lumbar spondylosis 02/28/2019   Chronic back pain 02/07/2019   Inflamed acrochordon 02/07/2019   Acrochordon- over R eye interferring with vision 02/07/2019   Acute otitis media 11/23/2018   Acute maxillary sinusitis 11/23/2018   History of malignant lymphoma 06/03/2018   Neuropathy 06/03/2018   Endometrial disorder- trace endometrial canal fluid seen on CT scan recently and in 2016.  No GYN care many yrs, to GYN for TVUS and further w/up prn 04/20/2018   Diabetic  peripheral neuropathy associated with type 2 diabetes mellitus ([place]) 04/20/2018   Acute on chronic pancreatitis ([place] [facility]) 04/13/2018   Onychomycosis 03/16/2018   Neuropathic pain syndrome (non-herpetic) 03/16/2018   Sleep apnea with CPAP 12/29/2016   History of decreased platelet count 12/29/2016   Morbid obesity ([facility]) 11/25/2016   Diabetes mellitus due to underlying condition with diabetic nephropathy, with long-term current use of insulin ([place]) 11/25/2016   Smoking hx  11/24/2016   Hypertension associated with diabetes ([place]) 11/24/2016   Chronic pancreatitis ([facility]) 11/24/2016   H/o Cancer:   B- cell lymphoma (spleen and lung) - non-Hodgkin's 11/24/2016   Diabetic neuropathy - exaccerbated by chemo 11/24/2016   GERD (gastroesophageal reflux disease) 11/24/2016   Hyperlipidemia associated with type 2 diabetes mellitus ([place]) 11/24/2016   B12 deficiency 11/24/2016   Vitamin B6 deficiency (non anemic) 11/24/2016   Past Medical History:  Diagnosis Date   Abnormal EKG    hx of right bundle branch block   Acute pancreatitis    HX OF   Anemia yrs ago   Cancer ([facility]) dx 2016   B cell lymphoma- Non Hodgkins in remission   Diabetes mellitus (HCC)    TYPE 2   Diabetic neuropathy (HCC)    Diabetic neuropathy (HCC)    Endometrial disorder    GERD (gastroesophageal reflux disease)    Headache    tension or sinus   Hypertension    Malignant lymphoma (HCC)    Morbid obesity ([place])    Peripheral neuropathy    BOTH FEET, SLIGHT NEUROPATHY IN HANDS   Sleep apnea    USES CPAP at times   Vitamin B 12 deficiency     Family History  Problem Relation Age of Onset   Cancer Mother 8       colon   Hypertension Mother    Osteoporosis Mother    Heart attack Father    Heart disease Sister    Alcohol abuse Maternal Grandfather     Past Surgical History:  Procedure Laterality Date   CHOLECYSTECTOMY  03/06/1996   colomscopy     DILATATION & CURETTAGE/HYSTEROSCOPY WITH MYOSURE N/A 07/04/2018   Procedure: DILATATION & CURETTAGE/HYSTEROSCOPY WITH MYOSURE;  Surgeon: Salvadore Dom, MD;  Location: [facility];  Service: Gynecology;  Laterality: N/A;   MEDIPORT INSERTION, DOUBLE     MEDIPORT REMOVAL     Social History   Occupational History   Occupation: retired from medical billing  Tobacco Use   Smoking status: Former    Packs/day: 0.50    Years: 4.00    Pack years: 2.00    Types: Cigarettes    Quit date: 12/21/1968    Years since  quitting: 53.1   Smokeless tobacco: Never   Tobacco comments:    as teenager  Vaping Use   Vaping Use: Never used  Substance and Sexual Activity   Alcohol use: No   Drug use: No   Sexual activity: Not Currently    Birth control/protection: None

## 2022-01-29 LAB — EXTRA SPECIMEN

## 2022-01-29 LAB — HEMOGLOBIN A1C
Hgb A1c MFr Bld: 5.9 % of total Hgb — ABNORMAL HIGH (ref <5.7)
Mean Plasma Glucose: 123 mg/dL
eAG (mmol/L): 6.8 mmol/L

## 2022-02-03 NOTE — Progress Notes (Signed)
Jasmine Davila  [address] [place],  [place]  [address] [phone]  Clinic Day:  02/09/2022  Referring physician: Lorrene Reid, PA-C  This document serves as a record of services personally performed by Hosie Poisson, MD. It was created on their behalf by Curry,Lauren E, a trained medical scribe. The creation of this record is based on the scribe's personal observations and the provider's statements to them.  CHIEF COMPLAINT:  CC:  Hormone receptor positive ductal carcinoma in situ of the right breast, as well as history of stage IV diffuse large B-cell lymphoma   Current Treatment:   Observation   HISTORY OF PRESENT ILLNESS:  Jasmine Davila is a 71 y.o. 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 Annual screening mammogram revealed an area of calcifications with in the right breast working further evaluation.  Diagnostic unilateral right mammogram in May confirmed an area of pleomorphic calcifications spanning 1.1 cm in the lower inner quadrant of the right breast.  Stereotactic biopsy revealed intermediate grade, ductal carcinoma in situ with necrosis.  Fibrocystic changes including florid and micropapillary usual ductal hyperplasia and micropapillary apocrine metaplasia were also seen.  No invasive carcinoma was identified.  Estrogen receptor was positive at 90% and progesterone receptor was positive at 95%.  She underwent lumpectomy in June.  Surgical pathology from this procedure confirmed ductal carcinoma in situ, intermediate grade.  The residual disease was only 1 mm remaining after the biopsy.  No invasive carcinoma identified and margins were free of neoplasm. We recommended chemoprevention with raloxifene, however, she declined.  She also has a history of stage IV diffuse large B-cell lymphoma diagnosed in May 2016, treated with 6 cycles of R-CHOP with a complete  response.  She   has remained without evidence of recurrence. She is up to date on her colonoscopy, with the last one being in November 2020.  This is to be repeated in 5 years due to positive family history.  She had a bone density scan in June 2020, which was normal.  She has had pancreatitis on and off since 2008.    Annual mammography from May 2022 was clear. CT imaging of the chest, abdomen and pelvis from August 2022 revealed no evidence of metastatic breast cancer or recurrence of lymphoma.   INTERVAL HISTORY:  Jasmine Davila is here for routine follow up and states that she has been fairly well. She is awaiting a knee replacement. Otherwise, she denies complaints. Blood counts and chemistries are unremarkable. Her  appetite is good, and she has gained 9 pounds since her last visit.  She denies fever, chills or other signs of infection.  She denies nausea, vomiting, bowel issues, or abdominal pain.  She denies sore throat, cough, dyspnea, or chest pain.  REVIEW OF SYSTEMS:  Review of Systems  Constitutional: Negative.  Negative for appetite change, chills, fatigue, fever and unexpected weight change.  HENT:  Negative.    Eyes: Negative.   Respiratory: Negative.  Negative for chest tightness, cough, hemoptysis, shortness of breath and wheezing.   Cardiovascular: Negative.  Negative for chest pain, leg swelling and palpitations.  Gastrointestinal: Negative.  Negative for abdominal distention, abdominal pain, blood in stool, constipation, diarrhea, nausea and vomiting.  Endocrine: Negative.   Genitourinary: Negative.  Negative for difficulty urinating, dysuria, frequency and hematuria.   Musculoskeletal:  Positive for arthralgias (of the knees) and gait problem (using a cane). Negative for back pain, flank  pain and myalgias.  Skin: Negative.   Neurological:  Positive for gait problem (using a cane). Negative for dizziness, extremity weakness, headaches, light-headedness, numbness, seizures and  speech difficulty.  Hematological: Negative.   Psychiatric/Behavioral: Negative.  Negative for depression and sleep disturbance. The patient is not nervous/anxious.     VITALS:  Blood pressure 138/67, pulse 73, temperature 98.2 F (36.8 C), temperature source Oral, resp. rate 18, height 5\' 7"  (1.702 m), weight 241 lb 3.2 oz (109.4 kg), last menstrual period 12/21/1998, SpO2 95 %.  Wt Readings from Last 3 Encounters:  02/09/22 241 lb 3.2 oz (109.4 kg)  01/13/22 230 lb (104.3 kg)  01/07/22 230 lb (104.3 kg)    Body mass index is 37.78 kg/m.  Performance status (ECOG): 0 - Asymptomatic  PHYSICAL EXAM:  Physical Exam Constitutional:      General: She is not in acute distress.    Appearance: Normal appearance. She is normal weight.  HENT:     Head: Normocephalic and atraumatic.  Eyes:     General: No scleral icterus.    Extraocular Movements: Extraocular movements intact.     Conjunctiva/sclera: Conjunctivae normal.     Pupils: Pupils are equal, round, and reactive to light.  Cardiovascular:     Rate and Rhythm: Normal rate and regular rhythm.     Pulses: Normal pulses.     Heart sounds: Normal heart sounds. No murmur heard.   No friction rub. No gallop.  Pulmonary:     Effort: Pulmonary effort is normal. No respiratory distress.     Breath sounds: Normal breath sounds.  Chest:     Comments: Faint scar in the lower inner quadrant of the right breast. Both breasts are without masses. Abdominal:     General: Bowel sounds are normal. There is no distension.     Palpations: Abdomen is soft. There is no hepatomegaly, splenomegaly or mass.     Tenderness: There is no abdominal tenderness.  Musculoskeletal:        General: Normal range of motion.     Cervical back: Normal range of motion and neck supple.     Right lower leg: No edema.     Left lower leg: No edema.  Lymphadenopathy:     Cervical: No cervical adenopathy.  Skin:    General: Skin is warm and dry.  Neurological:      General: No focal deficit present.     Mental Status: She is alert and oriented to person, place, and time. Mental status is at baseline.  Psychiatric:        Mood and Affect: Mood normal.        Behavior: Behavior normal.        Thought Content: Thought content normal.        Judgment: Judgment normal.   LABS:   CBC Latest Ref Rng & Units 02/09/2022 10/07/2021 08/05/2021  WBC - 5.4 6.1 4.3  Hemoglobin 12.0 - 16.0 13.3 14.2 12.3  Hematocrit 36 - 46 39 41.7 37  Platelets 150 - 399 167 181 145(A)   CMP Latest Ref Rng & Units 02/09/2022 10/07/2021 08/05/2021  Glucose 70 - 99 mg/dL - 117(H) -  BUN 4 - 21 12 14 10   Creatinine 0.5 - 1.1 0.6 0.78 0.7  Sodium 137 - 147 139 139 138  Potassium 3.4 - 5.3 3.7 4.6 4.2  Chloride 99 - 108 103 98 100  CO2 13 - 22 31(A) 25 30(A)  Calcium 8.7 - 10.7 9.3 10.3 9.5  Total Protein 6.0 - 8.5 g/dL - 7.9 -  Total Bilirubin 0.0 - 1.2 mg/dL - 0.6 -  Alkaline Phos 25 - 125 72 95 83  AST 13 - 35 39(A) 25 40(A)  ALT 7 - 35 25 23 29     Lab Results  Component Value Date   ALBUMINELP 4.1 08/30/2018   A1GS 0.3 08/30/2018   A2GS 0.8 08/30/2018   BETS 0.5 08/30/2018   BETA2SER 0.4 08/30/2018   GAMS 1.5 08/30/2018   SPEI  08/30/2018     Comment:     Normal Serum Protein Electrophoresis Pattern. No abnormal protein bands (M-protein) detected.    No results found for: TIBC, FERRITIN, IRONPCTSAT No results found for: LDH  STUDIES:    HISTORY:   Allergies:  Allergies  Allergen Reactions   Dilaudid [Hydromorphone Hcl] Other (See Comments)    anxious   Doxycycline Other (See Comments)    Pancreatic issues   Metronidazole Other (See Comments)    Cream  Burned face   Pseudoephedrine Hcl Other (See Comments)    Pt unsure    Current Medications: Current Outpatient Medications  Medication Sig Dispense Refill   aspirin EC 81 MG tablet Take 81 mg by mouth at bedtime.      Cholecalciferol (VITAMIN D-3) 5000 units TABS Take 1 tablet by mouth daily.      Continuous Blood Gluc Receiver (DEXCOM G6 RECEIVER) DEVI Use to check blood sugars every morning fasting and 2 hours after largest meal 1 each 0   Continuous Blood Gluc Sensor (DEXCOM G6 SENSOR) MISC Apply new sensor every 10 days.  Use to check blood sugars every morning fasting and 2 hours after largest meal 9 each 1   Continuous Blood Gluc Transmit (DEXCOM G6 TRANSMITTER) MISC Use to check blood sugars every morning fasting and 2 hours after largest meal 1 each 0   famotidine (PEPCID) 20 MG tablet Take 1 tablet (20 mg total) by mouth 2 (two) times daily. 180 tablet 0   fluticasone (FLONASE) 50 MCG/ACT nasal spray PLACE 1 SPRAY INTO BOTH NOSTRILS DAILY AS NEEDED FOR ALLERGIES OR RHINITIS. 16 g 5   folic acid (FOLVITE) 322 MCG tablet Take 400 mcg by mouth daily.     gabapentin (NEURONTIN) 600 MG tablet Take 2 tablets (1,200 mg total) by mouth 3 (three) times daily. 540 tablet 0   insulin lispro (HUMALOG KWIKPEN) 100 UNIT/ML KwikPen INJECT 20 UNITS 3 TIMES A DAY WITH MEALS 15 mL 2   Insulin Pen Needle (B-D UF III MINI PEN NEEDLES) 31G X 5 MM MISC Use as directed. 100 each 0   LANTUS SOLOSTAR 100 UNIT/ML Solostar Pen INJECT 70 UNITS INTO THE SKIN DAILY. 30 mL 1   losartan (COZAAR) 25 MG tablet Take 1 tablet (25 mg total) by mouth daily. 90 tablet 0   Multiple Vitamin (MULTIVITAMIN) tablet Take 1 tablet by mouth daily.     niacin (NIASPAN) 1000 MG CR tablet Take 1 tablet (1,000 mg total) by mouth at bedtime. An hour after 81 mg ASA 90 tablet 3   No current facility-administered medications for this visit.     ASSESSMENT & PLAN:   Assessment:   1.  Stage 0 ductal carcinoma in situ, treated with lumpectomy. We offered chemoprevention with raloxifene for a total of 5 years, but she declined.  She remains without evidence of disease, nearly 2 years postop.   2.  History of stage IV diffuse large B-cell lymphoma diagnosed in May 2016, treated with  6 cycles of R-CHOP chemotherapy with a complete  response.  Most recent imaging from August 2022 reveals no evidence of lymphoma recurrence. As she is over 5 years, we will not plan to repeat imaging unless otherwise indicated.   3.  Mediastinal node vs thyroid nodule, which has been stable. The radiologist feels it is more likely a thyroid nodule.     Plan:  She is scheduled for annual mammogram in May with Dr. Lilia Pro, and following that, she has requested that we take over her follow-up. We will see her back in 6 months with CBC, CMP and LDH for repeat evaluation. I don't feel we need annual CT scans any longer, since she is over 6 years out, unless we find an abnormality or symptom. She verbalizes understanding of and agreement to the plans discussed today. She knows to call the office should any new questions or concerns arise.    I provided 15 minutes of face-to-face time during this this encounter and > 50% was spent counseling as documented under my assessment and plan.    I, Rita Ohara, am acting as scribe for [name], MD  I have reviewed this report as typed by the medical scribe, and it is complete and accurate.

## 2022-02-07 ENCOUNTER — Other Ambulatory Visit: Payer: Self-pay | Admitting: Oncology

## 2022-02-07 DIAGNOSIS — C801 Malignant (primary) neoplasm, unspecified: Secondary | ICD-10-CM

## 2022-02-08 ENCOUNTER — Encounter: Payer: Self-pay | Admitting: Orthopaedic Surgery

## 2022-02-09 ENCOUNTER — Other Ambulatory Visit: Payer: Self-pay | Admitting: Oncology

## 2022-02-09 ENCOUNTER — Inpatient Hospital Stay: Payer: Medicare Other | Admitting: Oncology

## 2022-02-09 ENCOUNTER — Other Ambulatory Visit: Payer: Self-pay

## 2022-02-09 ENCOUNTER — Encounter: Payer: Self-pay | Admitting: Oncology

## 2022-02-09 ENCOUNTER — Inpatient Hospital Stay: Payer: Medicare Other | Attending: Oncology

## 2022-02-09 VITALS — BP 138/67 | HR 73 | Temp 98.2°F | Resp 18 | Ht 67.0 in | Wt 241.2 lb

## 2022-02-09 DIAGNOSIS — C801 Malignant (primary) neoplasm, unspecified: Secondary | ICD-10-CM

## 2022-02-09 DIAGNOSIS — D0511 Intraductal carcinoma in situ of right breast: Secondary | ICD-10-CM | POA: Diagnosis not present

## 2022-02-09 LAB — BASIC METABOLIC PANEL
BUN: 12 (ref 4–21)
CO2: 31 — AB (ref 13–22)
Chloride: 103 (ref 99–108)
Creatinine: 0.6 (ref 0.5–1.1)
Glucose: 78
Potassium: 3.7 (ref 3.4–5.3)
Sodium: 139 (ref 137–147)

## 2022-02-09 LAB — CBC AND DIFFERENTIAL
HCT: 39 (ref 36–46)
Hemoglobin: 13.3 (ref 12.0–16.0)
Neutrophils Absolute: 3.24
Platelets: 167 (ref 150–399)
WBC: 5.4

## 2022-02-09 LAB — HEPATIC FUNCTION PANEL
ALT: 25 (ref 7–35)
AST: 39 — AB (ref 13–35)
Alkaline Phosphatase: 72 (ref 25–125)
Bilirubin, Total: 0.7

## 2022-02-09 LAB — COMPREHENSIVE METABOLIC PANEL
Albumin: 4.2 (ref 3.5–5.0)
Calcium: 9.3 (ref 8.7–10.7)

## 2022-02-09 LAB — CBC: RBC: 4.53 (ref 3.87–5.11)

## 2022-02-10 ENCOUNTER — Encounter: Payer: Self-pay | Admitting: Oncology

## 2022-02-17 ENCOUNTER — Other Ambulatory Visit: Payer: Self-pay | Admitting: Physician Assistant

## 2022-02-19 ENCOUNTER — Encounter: Payer: Self-pay | Admitting: Oncology

## 2022-03-05 ENCOUNTER — Other Ambulatory Visit: Payer: Self-pay | Admitting: Physician Assistant

## 2022-03-14 ENCOUNTER — Other Ambulatory Visit: Payer: Self-pay | Admitting: Physician Assistant

## 2022-03-14 DIAGNOSIS — E1142 Type 2 diabetes mellitus with diabetic polyneuropathy: Secondary | ICD-10-CM

## 2022-03-20 ENCOUNTER — Ambulatory Visit (INDEPENDENT_AMBULATORY_CARE_PROVIDER_SITE_OTHER): Payer: Medicare Other | Admitting: Physician Assistant

## 2022-03-20 ENCOUNTER — Encounter: Payer: Self-pay | Admitting: Physician Assistant

## 2022-03-20 VITALS — BP 124/74 | HR 66 | Temp 97.8°F | Ht 67.0 in | Wt 246.0 lb

## 2022-03-20 DIAGNOSIS — D0511 Intraductal carcinoma in situ of right breast: Secondary | ICD-10-CM | POA: Diagnosis not present

## 2022-03-20 DIAGNOSIS — I152 Hypertension secondary to endocrine disorders: Secondary | ICD-10-CM

## 2022-03-20 DIAGNOSIS — E1169 Type 2 diabetes mellitus with other specified complication: Secondary | ICD-10-CM

## 2022-03-20 DIAGNOSIS — E1159 Type 2 diabetes mellitus with other circulatory complications: Secondary | ICD-10-CM

## 2022-03-20 DIAGNOSIS — Z794 Long term (current) use of insulin: Secondary | ICD-10-CM

## 2022-03-20 DIAGNOSIS — E0821 Diabetes mellitus due to underlying condition with diabetic nephropathy: Secondary | ICD-10-CM

## 2022-03-20 DIAGNOSIS — Z01818 Encounter for other preprocedural examination: Secondary | ICD-10-CM | POA: Diagnosis not present

## 2022-03-20 DIAGNOSIS — E785 Hyperlipidemia, unspecified: Secondary | ICD-10-CM

## 2022-03-20 DIAGNOSIS — M1711 Unilateral primary osteoarthritis, right knee: Secondary | ICD-10-CM

## 2022-03-20 MED ORDER — INSULIN LISPRO (1 UNIT DIAL) 100 UNIT/ML (KWIKPEN): 20.0000 [IU] | PEN_INJECTOR | Freq: Three times a day (TID) | SUBCUTANEOUS | 1 refills | Status: DC

## 2022-03-20 MED ORDER — LANTUS SOLOSTAR 100 UNIT/ML ~~LOC~~ SOPN: 70.0000 [IU] | PEN_INJECTOR | Freq: Every day | SUBCUTANEOUS | 1 refills | Status: DC

## 2022-03-20 NOTE — Assessment & Plan Note (Signed)
-  Recent A1c 5.9, stable. ?-Continue current medication regimen. See med list. ?-Will continue to monitor. ?

## 2022-03-20 NOTE — Progress Notes (Signed)
? ?Established Patient Office Visit ? ?Subjective:  ?Patient ID: Jasmine Davila, female    DOB: April 05, 1951  Age: 71 y.o. MRN: [id number] ? ?CC:  ?Chief Complaint  ?Patient presents with  ? Pre-op Exam  ? ? ?HPI ?Jasmine Davila presents for preoperative clearance. Patient is planning to undergo total right knee arthroplasty for osteoarthritis. States her knee has been giving out which has been limiting her activity level some days. Denies prior surgical complications or issues with anesthesia. No chest pain, palpitations, shortness of breath, dizziness or  syncope.  ? ?Past Medical History:  ?Diagnosis Date  ? Abnormal EKG   ? hx of right bundle branch block  ? Acute pancreatitis   ? HX OF  ? Anemia yrs ago  ? Cancer [facility]) dx 2016  ? B cell lymphoma- Non Hodgkins in remission  ? Diabetes mellitus ([place])   ? TYPE 2  ? Diabetic neuropathy ([place])   ? Diabetic neuropathy ([place])   ? Endometrial disorder   ? GERD (gastroesophageal reflux disease)   ? Headache   ? tension or sinus  ? Hypertension   ? Malignant lymphoma ([place])   ? Morbid obesity ([place])   ? Peripheral neuropathy   ? BOTH FEET, SLIGHT NEUROPATHY IN HANDS  ? Sleep apnea   ? USES CPAP at times  ? Vitamin B 12 deficiency   ? ? ?Past Surgical History:  ?Procedure Laterality Date  ? CHOLECYSTECTOMY  03/06/1996  ? colomscopy    ? DILATATION & CURETTAGE/HYSTEROSCOPY WITH MYOSURE N/A 07/04/2018  ? Procedure: DILATATION & CURETTAGE/HYSTEROSCOPY WITH MYOSURE;  Surgeon: Salvadore Dom, MD;  Location: [facility];  Service: Gynecology;  Laterality: N/A;  ? MEDIPORT INSERTION, DOUBLE    ? MEDIPORT REMOVAL    ? ? ?Family History  ?Problem Relation Age of Onset  ? Cancer Mother 30  ?     colon  ? Hypertension Mother   ? Osteoporosis Mother   ? Heart attack Father   ? Heart disease Sister   ? Alcohol abuse Maternal Grandfather   ? ? ?Social History  ? ?Socioeconomic History  ? Marital status: Married  ?  Spouse name: Not on file  ? Number of  children: 4  ? Years of education: 48  ? Highest education level: Not on file  ?Occupational History  ? Occupation: retired from medical billing  ?Tobacco Use  ? Smoking status: Former  ?  Packs/day: 0.50  ?  Years: 4.00  ?  Pack years: 2.00  ?  Types: Cigarettes  ?  Quit date: 12/21/1968  ?  Years since quitting: 53.2  ? Smokeless tobacco: Never  ? Tobacco comments:  ?  as teenager  ?Vaping Use  ? Vaping Use: Never used  ?Substance and Sexual Activity  ? Alcohol use: No  ? Drug use: No  ? Sexual activity: Not Currently  ?  Birth control/protection: None  ?Other Topics Concern  ? Not on file  ?Social History Narrative  ? Lives with husband in a one story home.  Has 3 living children.  One child died in a MVA.  Retired from [employer].    ? Education: high school.  ? ?Social Determinants of Health  ? ?Financial Resource Strain: Not on file  ?Food Insecurity: Not on file  ?Transportation Needs: Not on file  ?Physical Activity: Not on file  ?Stress: Not on file  ?Social Connections: Not on file  ?Intimate Partner Violence: Not on file  ? ? ?  Outpatient Medications Prior to Visit  ?Medication Sig Dispense Refill  ? aspirin EC 81 MG tablet Take 81 mg by mouth at bedtime.     ? Cholecalciferol (VITAMIN D-3) 5000 units TABS Take 1 tablet by mouth daily.    ? Continuous Blood Gluc Receiver (DEXCOM G6 RECEIVER) DEVI Use to check blood sugars every morning fasting and 2 hours after largest meal 1 each 0  ? Continuous Blood Gluc Sensor (DEXCOM G6 SENSOR) MISC Apply new sensor every 10 days.  Use to check blood sugars every morning fasting and 2 hours after largest meal 9 each 1  ? Continuous Blood Gluc Transmit (DEXCOM G6 TRANSMITTER) MISC Use to check blood sugars every morning fasting and 2 hours after largest meal 1 each 0  ? famotidine (PEPCID) 20 MG tablet TAKE 1 TABLET BY MOUTH TWICE  DAILY 180 tablet 1  ? fluticasone (FLONASE) 50 MCG/ACT nasal spray PLACE 1 SPRAY INTO BOTH NOSTRILS DAILY AS NEEDED FOR ALLERGIES OR  RHINITIS. 16 g 5  ? folic acid (FOLVITE) 494 MCG tablet Take 400 mcg by mouth daily.    ? gabapentin (NEURONTIN) 600 MG tablet TAKE 2 TABLETS BY MOUTH 3 TIMES  DAILY 540 tablet 3  ? Insulin Pen Needle (B-D UF III MINI PEN NEEDLES) 31G X 5 MM MISC Use as directed. 100 each 0  ? losartan (COZAAR) 25 MG tablet TAKE 1 TABLET BY MOUTH DAILY 90 tablet 0  ? Multiple Vitamin (MULTIVITAMIN) tablet Take 1 tablet by mouth daily.    ? niacin (NIASPAN) 1000 MG CR tablet Take 1 tablet (1,000 mg total) by mouth at bedtime. An hour after 81 mg ASA 90 tablet 3  ? insulin lispro (HUMALOG KWIKPEN) 100 UNIT/ML KwikPen INJECT 20 UNITS 3 TIMES A DAY WITH MEALS 15 mL 2  ? LANTUS SOLOSTAR 100 UNIT/ML Solostar Pen INJECT 70 UNITS INTO THE SKIN DAILY. 30 mL 1  ? ?No facility-administered medications prior to visit.  ? ? ?Allergies  ?Allergen Reactions  ? Dilaudid [Hydromorphone Hcl] Other (See Comments)  ?  anxious  ? Doxycycline Other (See Comments)  ?  Pancreatic issues  ? Metronidazole Other (See Comments)  ?  Cream  Burned face  ? Pseudoephedrine Hcl Other (See Comments)  ?  Pt unsure  ? ? ?ROS ?Review of Systems ?Review of Systems:  ?A fourteen system review of systems was performed and found to be positive as per HPI. ? ?Objective:  ?  ?Physical Exam ?Constitutional:   ?   General: She is not in acute distress. ?   Appearance: She is not toxic-appearing.  ?HENT:  ?   Head: Normocephalic and atraumatic.  ?   Right Ear: Tympanic membrane, ear canal and external ear normal. There is no impacted cerumen.  ?   Left Ear: Tympanic membrane, ear canal and external ear normal. There is no impacted cerumen.  ?   Nose: Nose normal. No rhinorrhea.  ?   Mouth/Throat:  ?   Mouth: Mucous membranes are moist.  ?   Pharynx: Oropharynx is clear.  ?Eyes:  ?   Extraocular Movements: Extraocular movements intact.  ?   Conjunctiva/sclera: Conjunctivae normal.  ?   Pupils: Pupils are equal, round, and reactive to light.  ?Cardiovascular:  ?   Rate and  Rhythm: Normal rate and regular rhythm.  ?   Pulses: Normal pulses.  ?   Heart sounds: Normal heart sounds.  ?Pulmonary:  ?   Effort: Pulmonary effort is normal. No respiratory distress.  ?  Breath sounds: Normal breath sounds. No wheezing or rales.  ?Abdominal:  ?   General: Bowel sounds are normal. There is no distension.  ?   Palpations: Abdomen is soft.  ?   Tenderness: There is no abdominal tenderness. There is no right CVA tenderness, left CVA tenderness, guarding or rebound.  ?Musculoskeletal:     ?   General: Tenderness present.  ?   Cervical back: Normal range of motion and neck supple.  ?Skin: ?   General: Skin is warm.  ?   Capillary Refill: Capillary refill takes less than 2 seconds.  ?Neurological:  ?   General: No focal deficit present.  ?   Mental Status: She is alert.  ?Psychiatric:     ?   Mood and Affect: Mood normal.     ?   Behavior: Behavior normal.  ? ? ?BP 124/74   Pulse 66   Temp 97.8 ?F (36.6 ?C)   Ht '5\' 7"'$  (1.702 m)   Wt 246 lb (111.6 kg)   LMP 12/21/1998 (Approximate)   SpO2 98%   BMI 38.53 kg/m?  ?Wt Readings from Last 3 Encounters:  ?03/20/22 246 lb (111.6 kg)  ?02/09/22 241 lb 3.2 oz (109.4 kg)  ?01/13/22 230 lb (104.3 kg)  ? ? ? ?Health Maintenance Due  ?Topic Date Due  ? Hepatitis C Screening  Never done  ? TETANUS/TDAP  Never done  ? Zoster Vaccines- Shingrix (1 of 2) Never done  ? Pneumonia Vaccine 33+ Years old (2 - PPSV23 if available, else PCV20) 09/20/2016  ? COVID-19 Vaccine (3 - Moderna risk series) 03/29/2020  ? OPHTHALMOLOGY EXAM  03/27/2021  ? ? ?There are no preventive care reminders to display for this patient. ? ?Lab Results  ?Component Value Date  ? TSH 1.510 01/01/2021  ? ?Lab Results  ?Component Value Date  ? WBC 5.4 02/09/2022  ? HGB 13.3 02/09/2022  ? HCT 39 02/09/2022  ? MCV 85 10/07/2021  ? PLT 167 02/09/2022  ? ?Lab Results  ?Component Value Date  ? NA 139 02/09/2022  ? K 3.7 02/09/2022  ? CO2 31 (A) 02/09/2022  ? GLUCOSE 117 (H) 10/07/2021  ? BUN 12  02/09/2022  ? CREATININE 0.6 02/09/2022  ? BILITOT 0.6 10/07/2021  ? ALKPHOS 72 02/09/2022  ? AST 39 (A) 02/09/2022  ? ALT 25 02/09/2022  ? PROT 7.9 10/07/2021  ? ALBUMIN 4.2 02/09/2022  ? CALCIUM 9.3 02/09/2022  ? ANIONG

## 2022-03-20 NOTE — Assessment & Plan Note (Signed)
-  Followed by oncology. ?-s/p lumpectomy  ?-Reviewed consult note 02/09/22. ?

## 2022-03-20 NOTE — Assessment & Plan Note (Signed)
-  Stable. Last lipid panel LDL mildly elevated, pt is fasting so will repeat lipid panel. ?-Patient managing with diet and niacin 1000 mg.  ?-Will continue to monitor. ?

## 2022-03-20 NOTE — Assessment & Plan Note (Signed)
-  BP initially elevated on intake, BP repeated and improved, stable. ?-Continue current medication regimen. See med list. ?-Will collect CMP to ensure renal function and electrolytes stable. ?

## 2022-03-21 LAB — COMPREHENSIVE METABOLIC PANEL
ALT: 24 IU/L (ref 0–32)
AST: 29 IU/L (ref 0–40)
Albumin/Globulin Ratio: 1.6 (ref 1.2–2.2)
Albumin: 4.4 g/dL (ref 3.7–4.7)
Alkaline Phosphatase: 89 IU/L (ref 44–121)
BUN/Creatinine Ratio: 16 (ref 12–28)
BUN: 12 mg/dL (ref 8–27)
Bilirubin Total: 0.5 mg/dL (ref 0.0–1.2)
CO2: 26 mmol/L (ref 20–29)
Calcium: 10 mg/dL (ref 8.7–10.3)
Chloride: 102 mmol/L (ref 96–106)
Creatinine, Ser: 0.74 mg/dL (ref 0.57–1.00)
Globulin, Total: 2.8 g/dL (ref 1.5–4.5)
Glucose: 78 mg/dL (ref 70–99)
Potassium: 4.3 mmol/L (ref 3.5–5.2)
Sodium: 141 mmol/L (ref 134–144)
Total Protein: 7.2 g/dL (ref 6.0–8.5)
eGFR: 86 mL/min/{1.73_m2} (ref >59)

## 2022-03-21 LAB — LIPID PANEL
Chol/HDL Ratio: 2.3 ratio (ref 0.0–4.4)
Cholesterol, Total: 155 mg/dL (ref 100–199)
HDL: 68 mg/dL (ref >39)
LDL Chol Calc (NIH): 74 mg/dL (ref 0–99)
Triglycerides: 68 mg/dL (ref 0–149)
VLDL Cholesterol Cal: 13 mg/dL (ref 5–40)

## 2022-03-23 ENCOUNTER — Encounter: Payer: Self-pay | Admitting: Physician Assistant

## 2022-04-20 ENCOUNTER — Other Ambulatory Visit: Payer: Self-pay | Admitting: Physician Assistant

## 2022-04-20 ENCOUNTER — Telehealth: Payer: Self-pay | Admitting: Orthopaedic Surgery

## 2022-04-20 ENCOUNTER — Other Ambulatory Visit: Payer: Self-pay

## 2022-04-20 MED ORDER — ASPIRIN EC 81 MG PO TBEC: 81.0000 mg | DELAYED_RELEASE_TABLET | Freq: Two times a day (BID) | ORAL | 0 refills | Status: AC

## 2022-04-20 MED ORDER — DOCUSATE SODIUM 100 MG PO CAPS: 100.0000 mg | ORAL_CAPSULE | Freq: Every day | ORAL | 2 refills | Status: DC | PRN

## 2022-04-20 MED ORDER — SULFAMETHOXAZOLE-TRIMETHOPRIM 800-160 MG PO TABS: 1.0000 | ORAL_TABLET | Freq: Two times a day (BID) | ORAL | 0 refills | Status: DC

## 2022-04-20 MED ORDER — OXYCODONE-ACETAMINOPHEN 5-325 MG PO TABS: 1.0000 | ORAL_TABLET | Freq: Four times a day (QID) | ORAL | 0 refills | Status: DC | PRN

## 2022-04-20 MED ORDER — ONDANSETRON HCL 4 MG PO TABS: 4.0000 mg | ORAL_TABLET | Freq: Three times a day (TID) | ORAL | 0 refills | Status: DC | PRN

## 2022-04-20 MED ORDER — METHOCARBAMOL 500 MG PO TABS: 500.0000 mg | ORAL_TABLET | Freq: Two times a day (BID) | ORAL | 2 refills | Status: DC | PRN

## 2022-04-20 NOTE — Telephone Encounter (Signed)
Ok sounds good

## 2022-04-20 NOTE — Telephone Encounter (Signed)
Patient's surgery for 04-27-22 was cancelled and rescheduled to 06-08-22.  Patient states her husband is in the hospital and she will need to take care of him first and then have her surgery in June.  ?

## 2022-04-20 NOTE — Progress Notes (Signed)
Surgical Instructions ? ? ? Your procedure is scheduled on 04/27/22. ? Report to [facility] Main Entrance "A" at 5:30 A.M., then check in with the Admitting office. ? Call this number if you have problems the morning of surgery: ? [phone] ? ? If you have any questions prior to your surgery date call [phone]: Open Monday-Friday 8am-4pm ? ? ? Remember: ? Do not eat after midnight the night before your surgery ? ?You may drink clear liquids until 4:30 the morning of your surgery.   ?Clear liquids allowed are: Water, Non-Citrus Juices (without pulp), Carbonated Beverages, Clear Tea, Black Coffee ONLY (NO MILK, CREAM OR POWDERED CREAMER of any kind), and Gatorade  ? ?Please complete your PRE-SURGERY GATORADE or WATER that was provided to you by 4:30  the morning of surgery.  Please, if able, drink it in one setting. DO NOT SIP.  ?  ? Take these medicines the morning of surgery with A SIP OF WATER:  ?cetirizine (ALLERGY, CETIRIZINE,) ?famotidine (PEPCID)  ?gabapentin (NEURONTIN) ? ?AS NEEDED: ?carboxymethylcellulose (REFRESH TEARS)  ?fluticasone (FLONASE) ? ? ?As of today, STOP taking any Aspirin (unless otherwise instructed by your surgeon) Aleve, Naproxen, Ibuprofen, Motrin, Advil, Goody's, BC's, all herbal medications, fish oil, and all vitamins. ? ?WHAT DO I DO ABOUT MY DIABETES MEDICATION? ? ? ? ?THE MORNING OF SURGERY, take __(50%)___35 ___ units of ___Lantus_______insulin. ? ?DO NOT take insulin lispro (HUMALOG KWIKPEN) the morning of surgery. ? ?The day of surgery, do not take other diabetes injectables, including Byetta (exenatide), Bydureon (exenatide ER), Victoza (liraglutide), or Trulicity (dulaglutide). ? ?If your CBG is greater than 220 mg/dL, you may take ? of your sliding scale (correction) dose of insulin. ? ? ?HOW TO MANAGE YOUR DIABETES ?BEFORE AND AFTER SURGERY ? ?Why is it important to control my blood sugar before and after surgery? ?Improving blood sugar levels before and after surgery helps  healing and can limit problems. ?A way of improving blood sugar control is eating a healthy diet by: ? Eating less sugar and carbohydrates ? Increasing activity/exercise ? Talking with your doctor about reaching your blood sugar goals ?High blood sugars (greater than 180 mg/dL) can raise your risk of infections and slow your recovery, so you will need to focus on controlling your diabetes during the weeks before surgery. ?Make sure that the doctor who takes care of your diabetes knows about your planned surgery including the date and location. ? ?How do I manage my blood sugar before surgery? ?Check your blood sugar at least 4 times a day, starting 2 days before surgery, to make sure that the level is not too high or low. ? ?Check your blood sugar the morning of your surgery when you wake up and every 2 hours until you get to the Short Stay unit. ? ?If your blood sugar is less than 70 mg/dL, you will need to treat for low blood sugar: ?Do not take insulin. ?Treat a low blood sugar (less than 70 mg/dL) with ? cup of clear juice (cranberry or apple), 4 glucose tablets, OR glucose gel. ?Recheck blood sugar in 15 minutes after treatment (to make sure it is greater than 70 mg/dL). If your blood sugar is not greater than 70 mg/dL on recheck, call [phone] for further instructions. ?Report your blood sugar to the short stay nurse when you get to Short Stay. ? ?If you are admitted to the hospital after surgery: ?Your blood sugar will be checked by the staff and you will probably be  given insulin after surgery (instead of oral diabetes medicines) to make sure you have good blood sugar levels. ?The goal for blood sugar control after surgery is 80-180 mg/dL.  ? ?         ?Do not wear jewelry or makeup ?Do not wear lotions, powders, perfumes or deodorant. ?Do not shave 48 hours prior to surgery.   ?Do not bring valuables to the hospital. ?Do not wear nail polish, gel polish, artificial nails, or any other type of covering  on natural nails (fingers and toes) ?If you have artificial nails or gel coating that need to be removed by a nail salon, please have this removed prior to surgery. Artificial nails or gel coating may interfere with anesthesia's ability to adequately monitor your vital signs. ? ?[facility] is not responsible for any belongings or valuables. .  ? ?Do NOT Smoke (Tobacco/Vaping)  24 hours prior to your procedure ? ?If you use a CPAP at night, you may bring your mask for your overnight stay. ?  ?Contacts, glasses, hearing aids, dentures or partials may not be worn into surgery, please bring cases for these belongings ?  ?For patients admitted to the hospital, discharge time will be determined by your treatment team. ?  ?Patients discharged the day of surgery will not be allowed to drive home, and someone needs to stay with them for 24 hours. ? ? ?SURGICAL WAITING ROOM VISITATION ?Patients having surgery or a procedure in a hospital may have two support people. ?Children under the age of 51 must have an adult with them who is not the patient. ?They may stay in the waiting area during the procedure and may switch out with other visitors. If the patient needs to stay at the hospital during part of their recovery, the visitor guidelines for inpatient rooms apply. ? ?Please refer to the [facility] website for the visitor guidelines for Inpatients (after your surgery is over and you are in a regular room).  ? ? ? ?Special instructions:   ? ?Oral Hygiene is also important to reduce your risk of infection.  Remember - BRUSH YOUR TEETH THE MORNING OF SURGERY WITH YOUR REGULAR TOOTHPASTE ? ? ?[facility]- Preparing For Surgery ? ?Before surgery, you can play an important role. Because skin is not sterile, your skin needs to be as free of germs as possible. You can reduce the number of germs on your skin by washing with CHG (chlorahexidine gluconate) Soap before surgery.  CHG is an antiseptic cleaner which kills germs and bonds  with the skin to continue killing germs even after washing.   ? ? ?Please do not use if you have an allergy to CHG or antibacterial soaps. If your skin becomes reddened/irritated stop using the CHG.  ?Do not shave (including legs and underarms) for at least 48 hours prior to first CHG shower. It is OK to shave your face. ? ?Please follow these instructions carefully. ?  ? ? Shower the NIGHT BEFORE SURGERY and the MORNING OF SURGERY with CHG Soap.  ? If you chose to wash your hair, wash your hair first as usual with your normal shampoo. After you shampoo, rinse your hair and body thoroughly to remove the shampoo.  Then [employer] and genitals (private parts) with your normal soap and rinse thoroughly to remove soap. ? ?After that Use CHG Soap as you would any other liquid soap. You can apply CHG directly to the skin and wash gently with a scrungie or a clean  washcloth.  ? ?Apply the CHG Soap to your body ONLY FROM THE NECK DOWN.  Do not use on open wounds or open sores. Avoid contact with your eyes, ears, mouth and genitals (private parts). Wash Face and genitals (private parts)  with your normal soap.  ? ?Wash thoroughly, paying special attention to the area where your surgery will be performed. ? ?Thoroughly rinse your body with warm water from the neck down. ? ?DO NOT shower/wash with your normal soap after using and rinsing off the CHG Soap. ? ?Pat yourself dry with a CLEAN TOWEL. ? ?Wear CLEAN PAJAMAS to bed the night before surgery ? ?Place CLEAN SHEETS on your bed the night before your surgery ? ?DO NOT SLEEP WITH PETS. ? ? ?Day of Surgery: ? ?Take a shower with CHG soap. ?Wear Clean/Comfortable clothing the morning of surgery ?Do not apply any deodorants/lotions.   ?Remember to brush your teeth WITH YOUR REGULAR TOOTHPASTE. ? ? ? ?If you received a COVID test during your pre-op visit, it is requested that you wear a mask when out in public, stay away from anyone that may not be feeling well, and notify your  surgeon if you develop symptoms. If you have been in contact with anyone that has tested positive in the last 10 days, please notify your surgeon. ? ?  ?Please read over the following fact sheets that yo

## 2022-04-21 ENCOUNTER — Inpatient Hospital Stay (HOSPITAL_COMMUNITY)
Admission: RE | Admit: 2022-04-21 | Discharge: 2022-04-21 | Disposition: A | Discharge status: Home / Self Care | Payer: Medicare Other | Source: Ambulatory Visit

## 2022-04-27 DIAGNOSIS — M1711 Unilateral primary osteoarthritis, right knee: Secondary | ICD-10-CM

## 2022-05-12 ENCOUNTER — Encounter: Payer: Medicare Other | Admitting: Orthopaedic Surgery

## 2022-05-14 ENCOUNTER — Other Ambulatory Visit: Payer: Self-pay | Admitting: Physician Assistant

## 2022-05-27 NOTE — Pre-Procedure Instructions (Signed)
Surgical Instructions    Your procedure is scheduled on Monday 06/08/22.   Report to [facility] Main Entrance "A" at 09:45 A.M., then check in with the Admitting office.  Call this number if you have problems the morning of surgery:  [phone]   If you have any questions prior to your surgery date call [phone]: Open Monday-Friday 8am-4pm    Remember:  Do not eat after midnight the night before your surgery  You may drink clear liquids until 08:45 A.M. the morning of your surgery.   Clear liquids allowed are: Water, Non-Citrus Juices (without pulp), Carbonated Beverages, Clear Tea, Black Coffee ONLY (NO MILK, CREAM OR POWDERED CREAMER of any kind), and Gatorade  Patient Instructions  The night before surgery:  No food after midnight. ONLY clear liquids after midnight  The day of surgery (if you have diabetes): Drink ONE (1) 12 oz G2 given to you in your pre admission testing appointment by 08:45 A.M. the morning of surgery. Drink in one sitting. Do not sip.  This drink was given to you during your hospital  pre-op appointment visit.  Nothing else to drink after completing the  12 oz bottle of G2.         If you have questions, please contact your surgeon's office.     Take these medicines the morning of surgery with A SIP OF WATER:   cetirizine (ALLERGY, CETIRIZINE)   famotidine (PEPCID)   gabapentin (NEURONTIN)    Take these medicines if needed:   carboxymethylcellulose (REFRESH TEARS)  docusate sodium (COLACE)  fluticasone (FLONASE)  methocarbamol (ROBAXIN)  ondansetron (ZOFRAN)    As of today, STOP taking any Aspirin (unless otherwise instructed by your surgeon) Aleve, Naproxen, Ibuprofen, Motrin, Advil, Goody's, BC's, all herbal medications, fish oil, and all vitamins.  WHAT DO I DO ABOUT MY DIABETES MEDICATION?   Do not take oral diabetes medicines (pills) the morning of surgery.  THE MORNING OF SURGERY, take 35 units of insulin glargine (LANTUS  SOLOSTAR) if you normally take this in the morning.  This is half of your normal dose.   The day of surgery, do not take other diabetes injectables, including Byetta (exenatide), Bydureon (exenatide ER), Victoza (liraglutide), or Trulicity (dulaglutide).  If your CBG is greater than 220 mg/dL, you may take  of your sliding scale (correction) dose of insulin.   HOW TO MANAGE YOUR DIABETES BEFORE AND AFTER SURGERY  Why is it important to control my blood sugar before and after surgery? Improving blood sugar levels before and after surgery helps healing and can limit problems. A way of improving blood sugar control is eating a healthy diet by:  Eating less sugar and carbohydrates  Increasing activity/exercise  Talking with your doctor about reaching your blood sugar goals High blood sugars (greater than 180 mg/dL) can raise your risk of infections and slow your recovery, so you will need to focus on controlling your diabetes during the weeks before surgery. Make sure that the doctor who takes care of your diabetes knows about your planned surgery including the date and location.  How do I manage my blood sugar before surgery? Check your blood sugar at least 4 times a day, starting 2 days before surgery, to make sure that the level is not too high or low.  Check your blood sugar the morning of your surgery when you wake up and every 2 hours until you get to the Short Stay unit.  If your blood sugar is less than 70  mg/dL, you will need to treat for low blood sugar: Do not take insulin. Treat a low blood sugar (less than 70 mg/dL) with  cup of clear juice (cranberry or apple), 4 glucose tablets, OR glucose gel. Recheck blood sugar in 15 minutes after treatment (to make sure it is greater than 70 mg/dL). If your blood sugar is not greater than 70 mg/dL on recheck, call [phone] for further instructions. Report your blood sugar to the short stay nurse when you get to Short Stay.  If you  are admitted to the hospital after surgery: Your blood sugar will be checked by the staff and you will probably be given insulin after surgery (instead of oral diabetes medicines) to make sure you have good blood sugar levels. The goal for blood sugar control after surgery is 80-180 mg/dL.            Do not wear jewelry or makeup Do not wear lotions, powders, perfumes/colognes, or deodorant. Do not shave 48 hours prior to surgery.  Men may shave face and neck. Do not bring valuables to the hospital. Do not wear nail polish, gel polish, artificial nails, or any other type of covering on natural nails (fingers and toes) If you have artificial nails or gel coating that need to be removed by a nail salon, please have this removed prior to surgery. Artificial nails or gel coating may interfere with anesthesia's ability to adequately monitor your vital signs.  [facility] is not responsible for any belongings or valuables. .   Do NOT Smoke (Tobacco/Vaping)  24 hours prior to your procedure  If you use a CPAP at night, you may bring your mask for your overnight stay.   Contacts, glasses, hearing aids, dentures or partials may not be worn into surgery, please bring cases for these belongings   For patients admitted to the hospital, discharge time will be determined by your treatment team.   Patients discharged the day of surgery will not be allowed to drive home, and someone needs to stay with them for 24 hours.   SURGICAL WAITING ROOM VISITATION Patients having surgery or a procedure in a hospital may have two support people. Children under the age of 66 must have an adult with them who is not the patient. They may stay in the waiting area during the procedure and may switch out with other visitors. If the patient needs to stay at the hospital during part of their recovery, the visitor guidelines for inpatient rooms apply.  Please refer to the [facility] website for the visitor guidelines for  Inpatients (after your surgery is over and you are in a regular room).       Special instructions:    Oral Hygiene is also important to reduce your risk of infection.  Remember - BRUSH YOUR TEETH THE MORNING OF SURGERY WITH YOUR REGULAR TOOTHPASTE   [place]- Preparing For Surgery  Before surgery, you can play an important role. Because skin is not sterile, your skin needs to be as free of germs as possible. You can reduce the number of germs on your skin by washing with CHG (chlorahexidine gluconate) Soap before surgery.  CHG is an antiseptic cleaner which kills germs and bonds with the skin to continue killing germs even after washing.     Please do not use if you have an allergy to CHG or antibacterial soaps. If your skin becomes reddened/irritated stop using the CHG.  Do not shave (including legs and underarms) for at  least 48 hours prior to first CHG shower. It is OK to shave your face.  Please follow these instructions carefully.     Shower the NIGHT BEFORE SURGERY and the MORNING OF SURGERY with CHG Soap.   If you chose to wash your hair, wash your hair first as usual with your normal shampoo. After you shampoo, rinse your hair and body thoroughly to remove the shampoo.  Then [employer] and genitals (private parts) with your normal soap and rinse thoroughly to remove soap.  After that Use CHG Soap as you would any other liquid soap. You can apply CHG directly to the skin and wash gently with a scrungie or a clean washcloth.   Apply the CHG Soap to your body ONLY FROM THE NECK DOWN.  Do not use on open wounds or open sores. Avoid contact with your eyes, ears, mouth and genitals (private parts). Wash Face and genitals (private parts)  with your normal soap.   Wash thoroughly, paying special attention to the area where your surgery will be performed.  Thoroughly rinse your body with warm water from the neck down.  DO NOT shower/wash with your normal soap after using and rinsing  off the CHG Soap.  Pat yourself dry with a CLEAN TOWEL.  Wear CLEAN PAJAMAS to bed the night before surgery  Place CLEAN SHEETS on your bed the night before your surgery  DO NOT SLEEP WITH PETS.   Day of Surgery:  Take a shower with CHG soap. Wear Clean/Comfortable clothing the morning of surgery Do not apply any deodorants/lotions.   Remember to brush your teeth WITH YOUR REGULAR TOOTHPASTE.    If you received a COVID test during your pre-op visit, it is requested that you wear a mask when out in public, stay away from anyone that may not be feeling well, and notify your surgeon if you develop symptoms. If you have been in contact with anyone that has tested positive in the last 10 days, please notify your surgeon.    Please read over the following fact sheets that you were given.

## 2022-05-28 ENCOUNTER — Other Ambulatory Visit: Payer: Self-pay

## 2022-05-28 ENCOUNTER — Encounter (HOSPITAL_COMMUNITY): Payer: Self-pay

## 2022-05-28 ENCOUNTER — Encounter (HOSPITAL_COMMUNITY)
Admission: RE | Admit: 2022-05-28 | Discharge: 2022-05-28 | Disposition: A | Discharge status: Home / Self Care | Payer: Medicare Other | Source: Ambulatory Visit | Attending: Orthopaedic Surgery | Admitting: Orthopaedic Surgery

## 2022-05-28 VITALS — BP 127/79 | HR 67 | Temp 97.9°F | Resp 17 | Ht 67.0 in | Wt 240.0 lb

## 2022-05-28 DIAGNOSIS — Z8719 Personal history of other diseases of the digestive system: Secondary | ICD-10-CM | POA: Diagnosis not present

## 2022-05-28 DIAGNOSIS — Z87891 Personal history of nicotine dependence: Secondary | ICD-10-CM | POA: Diagnosis not present

## 2022-05-28 DIAGNOSIS — Z9221 Personal history of antineoplastic chemotherapy: Secondary | ICD-10-CM | POA: Diagnosis not present

## 2022-05-28 DIAGNOSIS — Z7982 Long term (current) use of aspirin: Secondary | ICD-10-CM | POA: Diagnosis not present

## 2022-05-28 DIAGNOSIS — Z8572 Personal history of non-Hodgkin lymphomas: Secondary | ICD-10-CM | POA: Diagnosis not present

## 2022-05-28 DIAGNOSIS — I10 Essential (primary) hypertension: Secondary | ICD-10-CM | POA: Insufficient documentation

## 2022-05-28 DIAGNOSIS — Z01812 Encounter for preprocedural laboratory examination: Secondary | ICD-10-CM | POA: Insufficient documentation

## 2022-05-28 DIAGNOSIS — Z9889 Other specified postprocedural states: Secondary | ICD-10-CM | POA: Diagnosis not present

## 2022-05-28 DIAGNOSIS — Z6837 Body mass index (BMI) 37.0-37.9, adult: Secondary | ICD-10-CM | POA: Diagnosis not present

## 2022-05-28 DIAGNOSIS — Z853 Personal history of malignant neoplasm of breast: Secondary | ICD-10-CM | POA: Insufficient documentation

## 2022-05-28 DIAGNOSIS — M1711 Unilateral primary osteoarthritis, right knee: Secondary | ICD-10-CM | POA: Diagnosis not present

## 2022-05-28 DIAGNOSIS — G4733 Obstructive sleep apnea (adult) (pediatric): Secondary | ICD-10-CM | POA: Diagnosis not present

## 2022-05-28 DIAGNOSIS — E1142 Type 2 diabetes mellitus with diabetic polyneuropathy: Secondary | ICD-10-CM | POA: Diagnosis not present

## 2022-05-28 DIAGNOSIS — Z9049 Acquired absence of other specified parts of digestive tract: Secondary | ICD-10-CM | POA: Diagnosis not present

## 2022-05-28 DIAGNOSIS — I4510 Unspecified right bundle-branch block: Secondary | ICD-10-CM | POA: Insufficient documentation

## 2022-05-28 DIAGNOSIS — K219 Gastro-esophageal reflux disease without esophagitis: Secondary | ICD-10-CM | POA: Insufficient documentation

## 2022-05-28 DIAGNOSIS — E0821 Diabetes mellitus due to underlying condition with diabetic nephropathy: Secondary | ICD-10-CM

## 2022-05-28 DIAGNOSIS — Z01818 Encounter for other preprocedural examination: Secondary | ICD-10-CM

## 2022-05-28 DIAGNOSIS — Z794 Long term (current) use of insulin: Secondary | ICD-10-CM | POA: Insufficient documentation

## 2022-05-28 DIAGNOSIS — D649 Anemia, unspecified: Secondary | ICD-10-CM | POA: Diagnosis not present

## 2022-05-28 LAB — COMPREHENSIVE METABOLIC PANEL
ALT: 26 U/L (ref 0–44)
AST: 29 U/L (ref 15–41)
Albumin: 3.9 g/dL (ref 3.5–5.0)
Alkaline Phosphatase: 67 U/L (ref 38–126)
Anion gap: 8 (ref 5–15)
BUN: 13 mg/dL (ref 8–23)
CO2: 29 mmol/L (ref 22–32)
Calcium: 9.5 mg/dL (ref 8.9–10.3)
Chloride: 100 mmol/L (ref 98–111)
Creatinine, Ser: 0.66 mg/dL (ref 0.44–1.00)
GFR, Estimated: >60 mL/min (ref >60)
Glucose, Bld: 70 mg/dL (ref 70–99)
Potassium: 4.2 mmol/L (ref 3.5–5.1)
Sodium: 137 mmol/L (ref 135–145)
Total Bilirubin: 0.7 mg/dL (ref 0.3–1.2)
Total Protein: 7.4 g/dL (ref 6.5–8.1)

## 2022-05-28 LAB — CBC WITH DIFFERENTIAL/PLATELET
Abs Immature Granulocytes: 0.01 10*3/uL (ref 0.00–0.07)
Basophils Absolute: 0 10*3/uL (ref 0.0–0.1)
Basophils Relative: 1 %
Eosinophils Absolute: 0.2 10*3/uL (ref 0.0–0.5)
Eosinophils Relative: 3 %
HCT: 39.7 % (ref 36.0–46.0)
Hemoglobin: 13.1 g/dL (ref 12.0–15.0)
Immature Granulocytes: 0 %
Lymphocytes Relative: 35 %
Lymphs Abs: 2 10*3/uL (ref 0.7–4.0)
MCH: 29 pg (ref 26.0–34.0)
MCHC: 33 g/dL (ref 30.0–36.0)
MCV: 87.8 fL (ref 80.0–100.0)
Monocytes Absolute: 0.5 10*3/uL (ref 0.1–1.0)
Monocytes Relative: 9 %
Neutro Abs: 3.1 10*3/uL (ref 1.7–7.7)
Neutrophils Relative %: 52 %
Platelets: 154 10*3/uL (ref 150–400)
RBC: 4.52 MIL/uL (ref 3.87–5.11)
RDW: 12.9 % (ref 11.5–15.5)
WBC: 5.8 10*3/uL (ref 4.0–10.5)
nRBC: 0 % (ref 0.0–0.2)

## 2022-05-28 LAB — HEMOGLOBIN A1C
Hgb A1c MFr Bld: 6.1 % — ABNORMAL HIGH (ref 4.8–5.6)
Mean Plasma Glucose: 128.37 mg/dL

## 2022-05-28 LAB — SURGICAL PCR SCREEN
MRSA, PCR: NEGATIVE
Staphylococcus aureus: NEGATIVE

## 2022-05-28 LAB — GLUCOSE, CAPILLARY: Glucose-Capillary: 98 mg/dL (ref 70–99)

## 2022-05-28 NOTE — Progress Notes (Addendum)
PCP - Lorrene Reid PA Cardiologist - Denies  PPM/ICD - Denies Device Orders -  Rep Notified -   Chest x-ray - NI EKG - 03/23/22 Stress Test - A long time ago "everything was ok" ECHO - Denies Cardiac Cath - Denies  Sleep Study - Yes has OSA CPAP - Occasional  DM - Type II CBG at PAT appt 98 Fasting Blood Sugar - 98-140 Checks Blood Sugar _Throughout with dexcom____ times a day  Blood Thinner Instructions:Denies Aspirin Instructions:Requested patient call Dr. Phoebe Sharps office to find out when to stop the aspirin  ERAS Protcol -Yes PRE-SURGERY  G2   COVID TEST- NI    Anesthesia review No   Patient denies shortness of breath, fever, cough and chest pain at PAT appointment   All instructions explained to the patient, with a verbal understanding of the material. Patient agrees to go over the instructions while at home for a better understanding.  The opportunity to ask questions was provided.  Updated patient's arrival time to 0915

## 2022-05-29 NOTE — Anesthesia Preprocedure Evaluation (Addendum)
Anesthesia Evaluation  Patient identified by MRN, date of birth, ID band Patient awake    Reviewed: Allergy & Precautions, NPO status , Patient's Chart, lab work & pertinent test results  Airway Mallampati: II  TM Distance: >3 FB Neck ROM: Full    Dental no notable dental hx. (+) Poor Dentition, Dental Advisory Given   Pulmonary sleep apnea , former smoker,    Pulmonary exam normal breath sounds clear to auscultation       Cardiovascular hypertension, Pt. on medications negative cardio ROS Normal cardiovascular exam Rhythm:Regular Rate:Normal     Neuro/Psych  Headaches, negative psych ROS   GI/Hepatic Neg liver ROS, GERD  ,  Endo/Other  negative endocrine ROSdiabetes, Type 2  Renal/GU negative Renal ROS  negative genitourinary   Musculoskeletal  (+) Arthritis , Osteoarthritis,    Abdominal (+) + obese,   Peds negative pediatric ROS (+)  Hematology negative hematology ROS (+)   Anesthesia Other Findings   Reproductive/Obstetrics negative OB ROS                           Anesthesia Physical Anesthesia Plan  ASA: 3  Anesthesia Plan: Regional and Spinal   Post-op Pain Management: Regional block* and Minimal or no pain anticipated   Induction: Intravenous  PONV Risk Score and Plan: 2 and Ondansetron, Midazolam and Treatment may vary due to age or medical condition  Airway Management Planned: Simple Face Mask  Additional Equipment:   Intra-op Plan:   Post-operative Plan:   Informed Consent: I have reviewed the patients History and Physical, chart, labs and discussed the procedure including the risks, benefits and alternatives for the proposed anesthesia with the patient or authorized representative who has indicated his/her understanding and acceptance.     Dental advisory given  Plan Discussed with: CRNA  Anesthesia Plan Comments: (PAT note written 05/29/2022 by Myra Gianotti, PA-C. )       Anesthesia Quick Evaluation

## 2022-05-29 NOTE — Progress Notes (Signed)
Anesthesia Chart Review:   Case: [id number] Date/Time: 06/08/22 1130   Procedure: RIGHT TOTAL KNEE ARTHROPLASTY (Right: Knee)   Anesthesia type: Spinal   Pre-op diagnosis: right knee degenerative joint disease   Location: [facility] OR ROOM 09 / [place] OR   Surgeons: Leandrew Koyanagi, MD       DISCUSSION: Patient is a 71 year old female scheduled for the above procedure.  Surgery was initially scheduled for 04/27/2022, but she had to reschedule after her husband had been hospitalized.  History includes former smoker (quit 12/21/68), HTN, right BBB, GERD, OSA (inconsistent use of CPAP), DM2 (with neuropathy; uses Dexom continuous monitoring device), anemia, breast cancer (right breast DCIS, s/p right lumpectomy 05/27/20), non-Hodgkin's B-cell lymphoma (04/2015, s/p R-CHOP chemotherapy), cholecystectomy (6387), pancreatitis (2008 x2; 2016 x2; 2019 x2; s/p ERCP, pancreatic and biliary sphincterotomies at [facility] 06/20/19). BMI is consistent with obesity.   She had preoperative evaluation by Lorrene Reid, PA-C on 03/20/22. She wrote: "Preoperative clearance: -Patient ia scheduled for right total knee arthroplasty 04/27/22. -EKG obtained: NSR, rate 66 bpm, no acute ST-T wave changes. Compared to prior EKG's (06/22/2019, 10/19/2018) and no major changes noted. Patient denies cardiac symptoms. -Patient is on aspirin 81 mg and recommend stopping medication at least 72 hrs before surgery and resume at surgeon's discretion.  -Patient is medically cleared for surgery."   Anesthesia team to evaluate on the day of surgery.   VS: BP 127/79   Pulse 67   Temp 36.6 C (Oral)   Resp 17   Ht '5\' 7"'$  (1.702 m)   Wt 108.9 kg   LMP 12/21/1998 (Approximate)   SpO2 100%   BMI 37.59 kg/m   PROVIDERS: Lorrene Reid, PA-C is PCP Hosie Poisson, MD is HEM-ONC   LABS: Labs reviewed: Acceptable for surgery. (all labs ordered are listed, but only abnormal results are displayed)  Labs Reviewed  HEMOGLOBIN A1C - Abnormal;  Notable for the following components:      Result Value   Hgb A1c MFr Bld 6.1 (*)    All other components within normal limits  SURGICAL PCR SCREEN  GLUCOSE, CAPILLARY  CBC WITH DIFFERENTIAL/PLATELET  COMPREHENSIVE METABOLIC PANEL    IMAGES: MRI 12/08/21: IMPRESSION: 1. Tricompartmental osteoarthritis, moderate in the patellofemoral compartment. Small intra-articular bodies anterior to the ACL tibial attachment 2. Severe popliteus tendinosis. 3. Minimal fraying of the lateral meniscus mid body free edge. No discrete meniscal tear.   CT Chest/abd/pelvis 08/05/21 ([facility]/PACS): IMPRESSION: Chest Impression: 1. No evidence of breast cancer recurrence. No metastasis. 2. No evidence of lymphoma recurrence.  Abdomen / Pelvis Impression: 1. No evidence of lymphoma recurrence in the abdomen pelvis. 2. No evidence of metastatic breast cancer. 3. Chronic extrahepatic and LEFT intrahepatic biliary duct dilatation. Postcholecystectomy.   EKG: 03/20/22: Normal sinus rhythm Left axis deviation Right bundle branch block Minimal voltage criteria for LVH, may be normal variant, inferior infarct, age undetermined - She has had LAFB/RBBB on EKG tracings dating back to at least April 2019.   CV: Reported a remote history of a normal stress test.   Past Medical History:  Diagnosis Date   Abnormal EKG    hx of right bundle branch block   Acute pancreatitis    HX OF   Anemia yrs ago   Breast cancer ([place]) 2021   Cancer ([place]) dx 2016   B cell lymphoma- Non Hodgkins in remission   Diabetes mellitus ([place])    TYPE 2   Diabetic neuropathy (HCC)    Diabetic neuropathy ([place])  Endometrial disorder    GERD (gastroesophageal reflux disease)    Headache    tension or sinus   Hypertension    Malignant lymphoma (HCC)    Morbid obesity ([place])    Peripheral neuropathy    BOTH FEET, SLIGHT NEUROPATHY IN HANDS   Sleep apnea    USES CPAP at times   Vitamin B 12 deficiency     Past  Surgical History:  Procedure Laterality Date   BREAST LUMPECTOMY Right 2021   CHOLECYSTECTOMY  03/06/1996   colomscopy     DILATATION & CURETTAGE/HYSTEROSCOPY WITH MYOSURE N/A 07/04/2018   Procedure: DILATATION & CURETTAGE/HYSTEROSCOPY WITH MYOSURE;  Surgeon: Salvadore Dom, MD;  Location: [place];  Service: Gynecology;  Laterality: N/A;   MEDIPORT INSERTION, DOUBLE     MEDIPORT REMOVAL      MEDICATIONS:  aspirin EC 81 MG tablet   docusate sodium (COLACE) 100 MG capsule   methocarbamol (ROBAXIN) 500 MG tablet   ondansetron (ZOFRAN) 4 MG tablet   oxyCODONE-acetaminophen (PERCOCET) 5-325 MG tablet   sulfamethoxazole-trimethoprim (BACTRIM DS) 800-160 MG tablet   aspirin EC 81 MG tablet   carboxymethylcellulose (REFRESH TEARS) 0.5 % SOLN   cetirizine (ALLERGY, CETIRIZINE,) 10 MG tablet   Cholecalciferol (VITAMIN D-3) 5000 units TABS   Continuous Blood Gluc Receiver (DEXCOM G6 RECEIVER) DEVI   Continuous Blood Gluc Sensor (DEXCOM G6 SENSOR) MISC   Continuous Blood Gluc Transmit (DEXCOM G6 TRANSMITTER) MISC   famotidine (PEPCID) 20 MG tablet   fluticasone (FLONASE) 50 MCG/ACT nasal spray   folic acid (FOLVITE) 387 MCG tablet   gabapentin (NEURONTIN) 600 MG tablet   ibuprofen (ADVIL) 200 MG tablet   insulin glargine (LANTUS SOLOSTAR) 100 UNIT/ML Solostar Pen   insulin lispro (HUMALOG KWIKPEN) 100 UNIT/ML KwikPen   Insulin Pen Needle (B-D UF III MINI PEN NEEDLES) 31G X 5 MM MISC   losartan (COZAAR) 25 MG tablet   Multiple Vitamin (MULTIVITAMIN) tablet   NIACIN PO   No current facility-administered medications for this encounter.    Myra Gianotti, PA-C Surgical Short Stay/Anesthesiology [facility] Phone [phone] [facility] Phone [phone] 05/29/2022 5:03 PM

## 2022-06-02 ENCOUNTER — Other Ambulatory Visit: Payer: Self-pay | Admitting: Physician Assistant

## 2022-06-02 ENCOUNTER — Telehealth: Payer: Self-pay | Admitting: Orthopaedic Surgery

## 2022-06-02 MED ORDER — ONDANSETRON HCL 4 MG PO TABS: 4.0000 mg | ORAL_TABLET | Freq: Three times a day (TID) | ORAL | 0 refills | Status: DC | PRN

## 2022-06-02 MED ORDER — DOCUSATE SODIUM 100 MG PO CAPS: 100.0000 mg | ORAL_CAPSULE | Freq: Every day | ORAL | 2 refills | Status: DC | PRN

## 2022-06-02 MED ORDER — METHOCARBAMOL 500 MG PO TABS: 500.0000 mg | ORAL_TABLET | Freq: Two times a day (BID) | ORAL | 2 refills | Status: DC | PRN

## 2022-06-02 MED ORDER — SULFAMETHOXAZOLE-TRIMETHOPRIM 800-160 MG PO TABS
1.0000 | ORAL_TABLET | Freq: Two times a day (BID) | ORAL | 0 refills | Status: DC
Start: 2022-06-02 — End: 2022-08-07

## 2022-06-02 MED ORDER — OXYCODONE-ACETAMINOPHEN 5-325 MG PO TABS: 1.0000 | ORAL_TABLET | Freq: Four times a day (QID) | ORAL | 0 refills | Status: DC | PRN

## 2022-06-02 MED ORDER — ASPIRIN 81 MG PO TBEC: 81.0000 mg | DELAYED_RELEASE_TABLET | Freq: Two times a day (BID) | ORAL | 0 refills | Status: DC | PRN

## 2022-06-02 NOTE — Telephone Encounter (Signed)
Patient called stating she has some pre-surgery instruction questions. #1. Patient has a Dexcom system for her glucose monitoring. Does this need to be removed before surgery, or does that stay on? #2. Patient takes Niacin one hour after her ASA daily, does she need to stop this and if so when? #3. What can she take for pain until the surgery? Please call to discuss.

## 2022-06-02 NOTE — Telephone Encounter (Signed)
She needs to call the pre-op folks at the hospital for directions for the first two questions.  As far as the pain, we just want to avoid nsaids prior to surgery

## 2022-06-03 NOTE — Telephone Encounter (Signed)
Spoke to patient

## 2022-06-05 MED ORDER — TRANEXAMIC ACID 1000 MG/10ML IV SOLN
2000.0000 mg | INTRAVENOUS | Status: DC
  Filled 2022-06-05: qty 20

## 2022-06-05 NOTE — Progress Notes (Signed)
Pt made aware of surgery time change 1115-1322, arrival 0915, finish G2 drink by 0815.

## 2022-06-07 DIAGNOSIS — M1711 Unilateral primary osteoarthritis, right knee: Secondary | ICD-10-CM

## 2022-06-08 ENCOUNTER — Observation Stay (HOSPITAL_COMMUNITY): Payer: Medicare Other

## 2022-06-08 ENCOUNTER — Other Ambulatory Visit: Payer: Self-pay

## 2022-06-08 ENCOUNTER — Encounter (HOSPITAL_COMMUNITY): Payer: Self-pay | Admitting: Orthopaedic Surgery

## 2022-06-08 ENCOUNTER — Ambulatory Visit (HOSPITAL_BASED_OUTPATIENT_CLINIC_OR_DEPARTMENT_OTHER): Payer: Medicare Other | Admitting: Anesthesiology

## 2022-06-08 ENCOUNTER — Ambulatory Visit (HOSPITAL_COMMUNITY): Payer: Medicare Other | Admitting: Vascular Surgery

## 2022-06-08 ENCOUNTER — Encounter (HOSPITAL_COMMUNITY)
Admission: RE | Disposition: A | Discharge status: Home / Self Care | Payer: Self-pay | Source: Home / Self Care | Attending: Orthopaedic Surgery

## 2022-06-08 ENCOUNTER — Observation Stay (HOSPITAL_COMMUNITY)
Admission: RE | Admit: 2022-06-08 | Discharge: 2022-06-09 | Disposition: A | Discharge status: Home / Self Care | Payer: Medicare Other | Attending: Orthopaedic Surgery | Admitting: Orthopaedic Surgery

## 2022-06-08 DIAGNOSIS — K219 Gastro-esophageal reflux disease without esophagitis: Secondary | ICD-10-CM | POA: Diagnosis not present

## 2022-06-08 DIAGNOSIS — R519 Headache, unspecified: Secondary | ICD-10-CM | POA: Diagnosis not present

## 2022-06-08 DIAGNOSIS — Z8249 Family history of ischemic heart disease and other diseases of the circulatory system: Secondary | ICD-10-CM | POA: Insufficient documentation

## 2022-06-08 DIAGNOSIS — I10 Essential (primary) hypertension: Secondary | ICD-10-CM | POA: Diagnosis not present

## 2022-06-08 DIAGNOSIS — M1711 Unilateral primary osteoarthritis, right knee: Secondary | ICD-10-CM | POA: Diagnosis not present

## 2022-06-08 DIAGNOSIS — Z79899 Other long term (current) drug therapy: Secondary | ICD-10-CM | POA: Diagnosis not present

## 2022-06-08 DIAGNOSIS — D62 Acute posthemorrhagic anemia: Secondary | ICD-10-CM | POA: Diagnosis not present

## 2022-06-08 DIAGNOSIS — E119 Type 2 diabetes mellitus without complications: Secondary | ICD-10-CM

## 2022-06-08 DIAGNOSIS — Z853 Personal history of malignant neoplasm of breast: Secondary | ICD-10-CM | POA: Insufficient documentation

## 2022-06-08 DIAGNOSIS — Z87891 Personal history of nicotine dependence: Secondary | ICD-10-CM | POA: Diagnosis not present

## 2022-06-08 DIAGNOSIS — Z6837 Body mass index (BMI) 37.0-37.9, adult: Secondary | ICD-10-CM | POA: Diagnosis not present

## 2022-06-08 DIAGNOSIS — K0889 Other specified disorders of teeth and supporting structures: Secondary | ICD-10-CM | POA: Insufficient documentation

## 2022-06-08 DIAGNOSIS — M25561 Pain in right knee: Secondary | ICD-10-CM | POA: Diagnosis not present

## 2022-06-08 DIAGNOSIS — Z8572 Personal history of non-Hodgkin lymphomas: Secondary | ICD-10-CM | POA: Insufficient documentation

## 2022-06-08 DIAGNOSIS — E1142 Type 2 diabetes mellitus with diabetic polyneuropathy: Secondary | ICD-10-CM | POA: Insufficient documentation

## 2022-06-08 DIAGNOSIS — Z794 Long term (current) use of insulin: Secondary | ICD-10-CM | POA: Insufficient documentation

## 2022-06-08 DIAGNOSIS — G4730 Sleep apnea, unspecified: Secondary | ICD-10-CM | POA: Insufficient documentation

## 2022-06-08 DIAGNOSIS — Z7982 Long term (current) use of aspirin: Secondary | ICD-10-CM | POA: Diagnosis not present

## 2022-06-08 DIAGNOSIS — E0821 Diabetes mellitus due to underlying condition with diabetic nephropathy: Secondary | ICD-10-CM

## 2022-06-08 DIAGNOSIS — Z96651 Presence of right artificial knee joint: Secondary | ICD-10-CM

## 2022-06-08 DIAGNOSIS — R2689 Other abnormalities of gait and mobility: Secondary | ICD-10-CM | POA: Diagnosis not present

## 2022-06-08 HISTORY — PX: TOTAL KNEE ARTHROPLASTY: SHX125

## 2022-06-08 LAB — GLUCOSE, CAPILLARY
Glucose-Capillary: 96 mg/dL (ref 70–99)
Glucose-Capillary: 77 mg/dL (ref 70–99)
Glucose-Capillary: 93 mg/dL (ref 70–99)
Glucose-Capillary: 93 mg/dL (ref 70–99)
Glucose-Capillary: 115 mg/dL — ABNORMAL HIGH (ref 70–99)
Glucose-Capillary: 220 mg/dL — ABNORMAL HIGH (ref 70–99)

## 2022-06-08 SURGERY — ARTHROPLASTY, KNEE, TOTAL
Anesthesia: Regional | Site: Knee | Laterality: Right

## 2022-06-08 MED ORDER — KETOROLAC TROMETHAMINE 15 MG/ML IJ SOLN
7.5000 mg | Freq: Four times a day (QID) | INTRAMUSCULAR | Status: AC
  Administered 2022-06-08 – 2022-06-09 (×4): 7.5 mg via INTRAVENOUS
  Filled 2022-06-08 (×4): qty 1

## 2022-06-08 MED ORDER — TRANEXAMIC ACID 1000 MG/10ML IV SOLN
INTRAVENOUS | Status: DC | PRN
  Administered 2022-06-08: 2000 mg via TOPICAL

## 2022-06-08 MED ORDER — INSULIN ASPART 100 UNIT/ML IJ SOLN
20.0000 [IU] | Freq: Three times a day (TID) | INTRAMUSCULAR | Status: DC
Start: 2022-06-08 — End: 2022-06-08

## 2022-06-08 MED ORDER — OXYCODONE HCL ER 10 MG PO T12A
10.0000 mg | EXTENDED_RELEASE_TABLET | Freq: Two times a day (BID) | ORAL | Status: DC
  Administered 2022-06-08 – 2022-06-09 (×2): 10 mg via ORAL
  Filled 2022-06-08 (×2): qty 1

## 2022-06-08 MED ORDER — FENTANYL CITRATE (PF) 100 MCG/2ML IJ SOLN
INTRAMUSCULAR | Status: AC
  Filled 2022-06-08: qty 2

## 2022-06-08 MED ORDER — DEXAMETHASONE SODIUM PHOSPHATE 10 MG/ML IJ SOLN
INTRAMUSCULAR | Status: DC | PRN
  Administered 2022-06-08: 4 mg via INTRAVENOUS

## 2022-06-08 MED ORDER — PROPOFOL 10 MG/ML IV BOLUS
INTRAVENOUS | Status: DC | PRN
  Administered 2022-06-08 (×2): 20 mg via INTRAVENOUS
  Administered 2022-06-08: 120 mg via INTRAVENOUS

## 2022-06-08 MED ORDER — SODIUM CHLORIDE 0.9 % IR SOLN
Status: DC | PRN
  Administered 2022-06-08: 1000 mL

## 2022-06-08 MED ORDER — DOCUSATE SODIUM 100 MG PO CAPS
100.0000 mg | ORAL_CAPSULE | Freq: Two times a day (BID) | ORAL | Status: DC
  Administered 2022-06-08 – 2022-06-09 (×2): 100 mg via ORAL
  Filled 2022-06-08 (×2): qty 1

## 2022-06-08 MED ORDER — VANCOMYCIN HCL 1000 MG IV SOLR
INTRAVENOUS | Status: AC
Start: 2022-06-08 — End: ?
  Filled 2022-06-08: qty 20

## 2022-06-08 MED ORDER — CEFAZOLIN SODIUM-DEXTROSE 2-4 GM/100ML-% IV SOLN
2.0000 g | INTRAVENOUS | Status: AC
  Administered 2022-06-08: 2 g via INTRAVENOUS

## 2022-06-08 MED ORDER — CEFAZOLIN SODIUM-DEXTROSE 2-4 GM/100ML-% IV SOLN
2.0000 g | Freq: Four times a day (QID) | INTRAVENOUS | Status: AC
  Administered 2022-06-08 – 2022-06-09 (×2): 2 g via INTRAVENOUS
  Filled 2022-06-08 (×2): qty 100

## 2022-06-08 MED ORDER — ACETAMINOPHEN 500 MG PO TABS
1000.0000 mg | ORAL_TABLET | Freq: Four times a day (QID) | ORAL | Status: AC
  Administered 2022-06-08 – 2022-06-09 (×4): 1000 mg via ORAL
  Filled 2022-06-08 (×4): qty 2

## 2022-06-08 MED ORDER — METOCLOPRAMIDE HCL 5 MG PO TABS: 5.0000 mg | ORAL_TABLET | Freq: Three times a day (TID) | ORAL | Status: DC | PRN

## 2022-06-08 MED ORDER — BUPIVACAINE-MELOXICAM ER 400-12 MG/14ML IJ SOLN
INTRAMUSCULAR | Status: AC
  Filled 2022-06-08: qty 1

## 2022-06-08 MED ORDER — INSULIN ASPART 100 UNIT/ML IJ SOLN
0.0000 [IU] | Freq: Every day | INTRAMUSCULAR | Status: DC
  Administered 2022-06-08: 2 [IU] via SUBCUTANEOUS

## 2022-06-08 MED ORDER — ONDANSETRON HCL 4 MG/2ML IJ SOLN: 4.0000 mg | Freq: Four times a day (QID) | INTRAMUSCULAR | Status: DC | PRN

## 2022-06-08 MED ORDER — HYDROMORPHONE HCL 1 MG/ML IJ SOLN: 0.5000 mg | INTRAMUSCULAR | Status: DC | PRN

## 2022-06-08 MED ORDER — ASPIRIN 81 MG PO CHEW
81.0000 mg | CHEWABLE_TABLET | Freq: Two times a day (BID) | ORAL | Status: DC
Start: 2022-06-08 — End: 2022-06-09
  Administered 2022-06-08 – 2022-06-09 (×2): 81 mg via ORAL
  Filled 2022-06-08 (×2): qty 1

## 2022-06-08 MED ORDER — METOCLOPRAMIDE HCL 5 MG/ML IJ SOLN: 5.0000 mg | Freq: Three times a day (TID) | INTRAMUSCULAR | Status: DC | PRN

## 2022-06-08 MED ORDER — FERROUS SULFATE 325 (65 FE) MG PO TABS
325.0000 mg | ORAL_TABLET | Freq: Three times a day (TID) | ORAL | Status: DC
  Administered 2022-06-08 – 2022-06-09 (×2): 325 mg via ORAL
  Filled 2022-06-08 (×2): qty 1

## 2022-06-08 MED ORDER — TRANEXAMIC ACID-NACL 1000-0.7 MG/100ML-% IV SOLN
1000.0000 mg | Freq: Once | INTRAVENOUS | Status: AC
  Administered 2022-06-08: 1000 mg via INTRAVENOUS
  Filled 2022-06-08: qty 100

## 2022-06-08 MED ORDER — INSULIN ASPART 100 UNIT/ML IJ SOLN
0.0000 [IU] | Freq: Three times a day (TID) | INTRAMUSCULAR | Status: DC
  Administered 2022-06-09: 4 [IU] via SUBCUTANEOUS

## 2022-06-08 MED ORDER — GABAPENTIN 600 MG PO TABS
600.0000 mg | ORAL_TABLET | Freq: Three times a day (TID) | ORAL | Status: DC
  Administered 2022-06-08 – 2022-06-09 (×3): 600 mg via ORAL
  Filled 2022-06-08 (×3): qty 1

## 2022-06-08 MED ORDER — MIDAZOLAM HCL 2 MG/2ML IJ SOLN
INTRAMUSCULAR | Status: AC
  Administered 2022-06-08: 1 mg via INTRAVENOUS
  Filled 2022-06-08: qty 2

## 2022-06-08 MED ORDER — FENTANYL CITRATE (PF) 250 MCG/5ML IJ SOLN
INTRAMUSCULAR | Status: DC | PRN
  Administered 2022-06-08 (×2): 50 ug via INTRAVENOUS

## 2022-06-08 MED ORDER — PHENYLEPHRINE 80 MCG/ML (10ML) SYRINGE FOR IV PUSH (FOR BLOOD PRESSURE SUPPORT)
PREFILLED_SYRINGE | INTRAVENOUS | Status: DC | PRN
  Administered 2022-06-08 (×4): 80 ug via INTRAVENOUS

## 2022-06-08 MED ORDER — ONDANSETRON HCL 4 MG PO TABS: 4.0000 mg | ORAL_TABLET | Freq: Four times a day (QID) | ORAL | Status: DC | PRN

## 2022-06-08 MED ORDER — DEXAMETHASONE SODIUM PHOSPHATE 10 MG/ML IJ SOLN
10.0000 mg | Freq: Once | INTRAMUSCULAR | Status: AC
Start: 2022-06-09 — End: 2022-06-09
  Administered 2022-06-09: 10 mg via INTRAVENOUS
  Filled 2022-06-08: qty 1

## 2022-06-08 MED ORDER — PRONTOSAN WOUND IRRIGATION OPTIME
TOPICAL | Status: DC | PRN
  Administered 2022-06-08: 1 via TOPICAL

## 2022-06-08 MED ORDER — OXYCODONE HCL 5 MG/5ML PO SOLN: 5.0000 mg | Freq: Once | ORAL | Status: AC | PRN

## 2022-06-08 MED ORDER — FENTANYL CITRATE (PF) 100 MCG/2ML IJ SOLN
INTRAMUSCULAR | Status: AC
  Administered 2022-06-08: 100 ug via INTRAVENOUS
  Filled 2022-06-08: qty 2

## 2022-06-08 MED ORDER — TRANEXAMIC ACID-NACL 1000-0.7 MG/100ML-% IV SOLN
1000.0000 mg | INTRAVENOUS | Status: AC
  Administered 2022-06-08: 1000 mg via INTRAVENOUS

## 2022-06-08 MED ORDER — OXYCODONE HCL 5 MG PO TABS: 10.0000 mg | ORAL_TABLET | ORAL | Status: DC | PRN

## 2022-06-08 MED ORDER — INSULIN GLARGINE-YFGN 100 UNIT/ML ~~LOC~~ SOLN
70.0000 [IU] | Freq: Every day | SUBCUTANEOUS | Status: DC
  Filled 2022-06-08 (×2): qty 0.7

## 2022-06-08 MED ORDER — LIDOCAINE 2% (20 MG/ML) 5 ML SYRINGE
INTRAMUSCULAR | Status: AC
  Filled 2022-06-08: qty 5

## 2022-06-08 MED ORDER — FENTANYL CITRATE (PF) 100 MCG/2ML IJ SOLN
25.0000 ug | INTRAMUSCULAR | Status: DC | PRN
  Administered 2022-06-08 (×3): 50 ug via INTRAVENOUS

## 2022-06-08 MED ORDER — OXYCODONE HCL 5 MG PO TABS
ORAL_TABLET | ORAL | Status: AC
  Filled 2022-06-08: qty 1

## 2022-06-08 MED ORDER — CHLORHEXIDINE GLUCONATE 0.12 % MT SOLN
15.0000 mL | Freq: Once | OROMUCOSAL | Status: AC
Start: 2022-06-08 — End: 2022-06-08

## 2022-06-08 MED ORDER — POVIDONE-IODINE 10 % EX SWAB
2.0000 "application " | Freq: Once | CUTANEOUS | Status: DC
  Administered 2022-06-08: 2 via TOPICAL

## 2022-06-08 MED ORDER — EPHEDRINE SULFATE-NACL 50-0.9 MG/10ML-% IV SOSY
PREFILLED_SYRINGE | INTRAVENOUS | Status: DC | PRN
  Administered 2022-06-08: 10 mg via INTRAVENOUS

## 2022-06-08 MED ORDER — PROMETHAZINE HCL 25 MG/ML IJ SOLN: 6.2500 mg | INTRAMUSCULAR | Status: DC | PRN

## 2022-06-08 MED ORDER — TRANEXAMIC ACID-NACL 1000-0.7 MG/100ML-% IV SOLN
INTRAVENOUS | Status: AC
  Filled 2022-06-08: qty 100

## 2022-06-08 MED ORDER — FENTANYL CITRATE (PF) 100 MCG/2ML IJ SOLN: 100.0000 ug | Freq: Once | INTRAMUSCULAR | Status: AC

## 2022-06-08 MED ORDER — 0.9 % SODIUM CHLORIDE (POUR BTL) OPTIME
TOPICAL | Status: DC | PRN
  Administered 2022-06-08: 1000 mL

## 2022-06-08 MED ORDER — VANCOMYCIN HCL 1000 MG IV SOLR
INTRAVENOUS | Status: DC | PRN
  Administered 2022-06-08: 1000 mg via TOPICAL

## 2022-06-08 MED ORDER — MORPHINE SULFATE (PF) 2 MG/ML IV SOLN: 2.0000 mg | INTRAVENOUS | Status: DC | PRN

## 2022-06-08 MED ORDER — ONDANSETRON HCL 4 MG/2ML IJ SOLN
INTRAMUSCULAR | Status: DC | PRN
  Administered 2022-06-08: 4 mg via INTRAVENOUS

## 2022-06-08 MED ORDER — FENTANYL CITRATE (PF) 250 MCG/5ML IJ SOLN
INTRAMUSCULAR | Status: AC
  Filled 2022-06-08: qty 5

## 2022-06-08 MED ORDER — OXYCODONE HCL 5 MG PO TABS: 5.0000 mg | ORAL_TABLET | ORAL | Status: DC | PRN

## 2022-06-08 MED ORDER — CEFAZOLIN SODIUM-DEXTROSE 2-4 GM/100ML-% IV SOLN
INTRAVENOUS | Status: AC
  Filled 2022-06-08: qty 100

## 2022-06-08 MED ORDER — ORAL CARE MOUTH RINSE
15.0000 mL | Freq: Once | OROMUCOSAL | Status: AC
Start: 2022-06-08 — End: 2022-06-08

## 2022-06-08 MED ORDER — LACTATED RINGERS IV SOLN: INTRAVENOUS | Status: DC

## 2022-06-08 MED ORDER — LOSARTAN POTASSIUM 50 MG PO TABS
25.0000 mg | ORAL_TABLET | Freq: Every day | ORAL | Status: DC
  Administered 2022-06-08 – 2022-06-09 (×2): 25 mg via ORAL
  Filled 2022-06-08 (×2): qty 1

## 2022-06-08 MED ORDER — METHOCARBAMOL 500 MG PO TABS
500.0000 mg | ORAL_TABLET | Freq: Four times a day (QID) | ORAL | Status: DC | PRN
  Administered 2022-06-08: 500 mg via ORAL
  Filled 2022-06-08: qty 1

## 2022-06-08 MED ORDER — BUPIVACAINE IN DEXTROSE 0.75-8.25 % IT SOLN
INTRATHECAL | Status: DC | PRN
  Administered 2022-06-08: 2 mL via INTRATHECAL

## 2022-06-08 MED ORDER — ACETAMINOPHEN 325 MG PO TABS: 325.0000 mg | ORAL_TABLET | Freq: Four times a day (QID) | ORAL | Status: DC | PRN

## 2022-06-08 MED ORDER — SODIUM CHLORIDE 0.9 % IV SOLN: INTRAVENOUS | Status: DC

## 2022-06-08 MED ORDER — DEXAMETHASONE SODIUM PHOSPHATE 10 MG/ML IJ SOLN
INTRAMUSCULAR | Status: AC
  Filled 2022-06-08: qty 1

## 2022-06-08 MED ORDER — CHLORHEXIDINE GLUCONATE 0.12 % MT SOLN
OROMUCOSAL | Status: AC
  Administered 2022-06-08: 15 mL via OROMUCOSAL
  Filled 2022-06-08: qty 15

## 2022-06-08 MED ORDER — PROPOFOL 500 MG/50ML IV EMUL
INTRAVENOUS | Status: DC | PRN
  Administered 2022-06-08 (×2): 100 ug/kg/min via INTRAVENOUS
  Administered 2022-06-08: 75 ug/kg/min via INTRAVENOUS

## 2022-06-08 MED ORDER — BUPIVACAINE-MELOXICAM ER 400-12 MG/14ML IJ SOLN
INTRAMUSCULAR | Status: DC | PRN
  Administered 2022-06-08: 400 mg

## 2022-06-08 MED ORDER — ROPIVACAINE HCL 5 MG/ML IJ SOLN
INTRAMUSCULAR | Status: DC | PRN
  Administered 2022-06-08: 20 mL via PERINEURAL

## 2022-06-08 MED ORDER — MIDAZOLAM HCL 2 MG/2ML IJ SOLN: 1.0000 mg | Freq: Once | INTRAMUSCULAR | Status: AC

## 2022-06-08 MED ORDER — PROPOFOL 10 MG/ML IV BOLUS
INTRAVENOUS | Status: AC
  Filled 2022-06-08: qty 20

## 2022-06-08 MED ORDER — OXYCODONE HCL 5 MG PO TABS
5.0000 mg | ORAL_TABLET | Freq: Once | ORAL | Status: AC | PRN
  Administered 2022-06-08: 5 mg via ORAL

## 2022-06-08 MED ORDER — ONDANSETRON HCL 4 MG/2ML IJ SOLN
INTRAMUSCULAR | Status: AC
  Filled 2022-06-08: qty 2

## 2022-06-08 MED ORDER — PHENOL 1.4 % MT LIQD: 1.0000 | OROMUCOSAL | Status: DC | PRN

## 2022-06-08 MED ORDER — METHOCARBAMOL 1000 MG/10ML IJ SOLN: 500.0000 mg | Freq: Four times a day (QID) | INTRAVENOUS | Status: DC | PRN

## 2022-06-08 MED ORDER — MENTHOL 3 MG MT LOZG: 1.0000 | LOZENGE | OROMUCOSAL | Status: DC | PRN

## 2022-06-08 SURGICAL SUPPLY — 84 items
ALCOHOL 70% 16 OZ (MISCELLANEOUS) ×2 IMPLANT
BAG COUNTER SPONGE SURGICOUNT (BAG) IMPLANT
BAG DECANTER FOR FLEXI CONT (MISCELLANEOUS) ×2 IMPLANT
BANDAGE ESMARK 6X9 LF (GAUZE/BANDAGES/DRESSINGS) IMPLANT
BLADE SAG 18X100X1.27 (BLADE) ×2 IMPLANT
BNDG ESMARK 6X9 LF (GAUZE/BANDAGES/DRESSINGS)
BOWL SMART MIX CTS (DISPOSABLE) ×2 IMPLANT
CEMENT BONE REFOBACIN R1X40 US (Cement) ×2 IMPLANT
CLSR STERI-STRIP ANTIMIC 1/2X4 (GAUZE/BANDAGES/DRESSINGS) ×4 IMPLANT
COOLER ICEMAN CLASSIC (MISCELLANEOUS) ×3 IMPLANT
COVER SURGICAL LIGHT HANDLE (MISCELLANEOUS) ×2 IMPLANT
CUFF TOURN SGL QUICK 34 (TOURNIQUET CUFF) ×1
CUFF TOURN SGL QUICK 42 (TOURNIQUET CUFF) IMPLANT
CUFF TRNQT CYL 34X4.125X (TOURNIQUET CUFF) ×1 IMPLANT
DERMABOND ADVANCED (GAUZE/BANDAGES/DRESSINGS) ×1
DERMABOND ADVANCED .7 DNX12 (GAUZE/BANDAGES/DRESSINGS) ×1 IMPLANT
DRAPE EXTREMITY T 121X128X90 (DISPOSABLE) ×2 IMPLANT
DRAPE HALF SHEET 40X57 (DRAPES) ×2 IMPLANT
DRAPE INCISE IOBAN 66X45 STRL (DRAPES) ×2 IMPLANT
DRAPE ORTHO SPLIT 77X108 STRL (DRAPES) ×2
DRAPE POUCH INSTRU U-SHP 10X18 (DRAPES) ×2 IMPLANT
DRAPE SURG ORHT 6 SPLT 77X108 (DRAPES) ×2 IMPLANT
DRAPE U-SHAPE 47X51 STRL (DRAPES) ×4 IMPLANT
DRSG AQUACEL AG ADV 3.5X10 (GAUZE/BANDAGES/DRESSINGS) ×2 IMPLANT
DURAPREP 26ML APPLICATOR (WOUND CARE) ×6 IMPLANT
ELECT CAUTERY BLADE 6.4 (BLADE) ×2 IMPLANT
ELECT REM PT RETURN 9FT ADLT (ELECTROSURGICAL) ×2
ELECTRODE REM PT RTRN 9FT ADLT (ELECTROSURGICAL) ×1 IMPLANT
FEMUR CMT CR STD SZ 8 RT KNEE (Joint) ×2 IMPLANT
FEMUR CMTD CR STD SZ 8 RT KNEE (Joint) IMPLANT
GLOVE BIOGEL PI IND STRL 7.0 (GLOVE) ×1 IMPLANT
GLOVE BIOGEL PI IND STRL 7.5 (GLOVE) ×4 IMPLANT
GLOVE BIOGEL PI INDICATOR 7.0 (GLOVE) ×1
GLOVE BIOGEL PI INDICATOR 7.5 (GLOVE) ×4
GLOVE ECLIPSE 7.0 STRL STRAW (GLOVE) ×6 IMPLANT
GLOVE SKINSENSE NS SZ7.5 (GLOVE) ×3
GLOVE SKINSENSE STRL SZ7.5 (GLOVE) ×3 IMPLANT
GLOVE SURG UNDER LTX SZ7.5 (GLOVE) ×4 IMPLANT
GLOVE SURG UNDER POLY LF SZ7 (GLOVE) ×4 IMPLANT
GOWN STRL REIN XL XLG (GOWN DISPOSABLE) ×2 IMPLANT
GOWN STRL REUS W/ TWL LRG LVL3 (GOWN DISPOSABLE) ×1 IMPLANT
GOWN STRL REUS W/TWL LRG LVL3 (GOWN DISPOSABLE) ×1
HANDPIECE INTERPULSE COAX TIP (DISPOSABLE) ×1
HDLS TROCR DRIL PIN KNEE 75 (PIN) ×1
HOOD PEEL AWAY FLYTE STAYCOOL (MISCELLANEOUS) ×4 IMPLANT
INSERT TIBIA KNEE RIGHT 10 (Joint) ×1 IMPLANT
JET LAVAGE IRRISEPT WOUND (IRRIGATION / IRRIGATOR) ×2
KIT BASIN OR (CUSTOM PROCEDURE TRAY) ×2 IMPLANT
KIT TURNOVER KIT B (KITS) ×2 IMPLANT
LAVAGE JET IRRISEPT WOUND (IRRIGATION / IRRIGATOR) ×1 IMPLANT
MANIFOLD NEPTUNE II (INSTRUMENTS) ×2 IMPLANT
MARKER SKIN DUAL TIP RULER LAB (MISCELLANEOUS) ×4 IMPLANT
NDL SPNL 18GX3.5 QUINCKE PK (NEEDLE) ×1 IMPLANT
NEEDLE SPNL 18GX3.5 QUINCKE PK (NEEDLE) ×2 IMPLANT
NS IRRIG 1000ML POUR BTL (IV SOLUTION) ×2 IMPLANT
PACK TOTAL JOINT (CUSTOM PROCEDURE TRAY) ×2 IMPLANT
PAD ARMBOARD 7.5X6 YLW CONV (MISCELLANEOUS) ×4 IMPLANT
PAD COLD SHLDR WRAP-ON (PAD) ×3 IMPLANT
PENCIL BUTTON HOLSTER BLD 10FT (ELECTRODE) ×1 IMPLANT
PIN DRILL HDLS TROCAR 75 4PK (PIN) IMPLANT
SAW OSC TIP CART 19.5X105X1.3 (SAW) ×2 IMPLANT
SCREW FEMALE HEX FIX 25X2.5 (ORTHOPEDIC DISPOSABLE SUPPLIES) ×1 IMPLANT
SET HNDPC FAN SPRY TIP SCT (DISPOSABLE) ×1 IMPLANT
STAPLER VISISTAT 35W (STAPLE) IMPLANT
STEM POLY PAT PLY 35M KNEE (Knees) ×1 IMPLANT
STEM TIBIA 5 DEG SZ F R KNEE (Knees) IMPLANT
SUCTION FRAZIER HANDLE 10FR (MISCELLANEOUS) ×1
SUCTION TUBE FRAZIER 10FR DISP (MISCELLANEOUS) ×1 IMPLANT
SUT ETHILON 2 0 FS 18 (SUTURE) ×4 IMPLANT
SUT MNCRL AB 3-0 PS2 27 (SUTURE) IMPLANT
SUT VIC AB 0 CT1 27 (SUTURE) ×2
SUT VIC AB 0 CT1 27XBRD ANBCTR (SUTURE) ×2 IMPLANT
SUT VIC AB 1 CTX 27 (SUTURE) ×6 IMPLANT
SUT VIC AB 2-0 CT1 27 (SUTURE) ×5
SUT VIC AB 2-0 CT1 TAPERPNT 27 (SUTURE) ×4 IMPLANT
SYR 50ML LL SCALE MARK (SYRINGE) ×4 IMPLANT
TIBIA STEM 5 DEG SZ F R KNEE (Knees) ×2 IMPLANT
TOWEL GREEN STERILE (TOWEL DISPOSABLE) ×2 IMPLANT
TOWEL GREEN STERILE FF (TOWEL DISPOSABLE) ×2 IMPLANT
TRAY CATH 16FR W/PLASTIC CATH (SET/KITS/TRAYS/PACK) IMPLANT
TRAY FOLEY W/BAG SLVR 16FR (SET/KITS/TRAYS/PACK) ×1
TRAY FOLEY W/BAG SLVR 16FR ST (SET/KITS/TRAYS/PACK) IMPLANT
UNDERPAD 30X36 HEAVY ABSORB (UNDERPADS AND DIAPERS) ×2 IMPLANT
YANKAUER SUCT BULB TIP NO VENT (SUCTIONS) ×4 IMPLANT

## 2022-06-08 NOTE — Op Note (Signed)
Total Knee Arthroplasty Procedure Note  Preoperative diagnosis: Right knee osteoarthritis  Postoperative diagnosis:same  Operative procedure: Right total knee arthroplasty. CPT [phone]  Surgeon: N. Eduard Roux, MD  Assist: Madalyn Rob, PA-C; necessary for the timely completion of procedure and due to complexity of procedure.  Anesthesia: Spinal, regional, local  Tourniquet time: see anesthesia record  Implants used: Zimmer persona Femur: CR 8 Tibia: F Patella: 35 mm Polyethylene: 10 mm, MC  Indication: Jasmine Davila is a 71 y.o. year old female with a history of knee pain. Having failed conservative management, the patient elected to proceed with a total knee arthroplasty.  We have reviewed the risk and benefits of the surgery and they elected to proceed after voicing understanding.  Procedure:  After informed consent was obtained and understanding of the risk were voiced including but not limited to bleeding, infection, damage to surrounding structures including nerves and vessels, blood clots, leg length inequality and the failure to achieve desired results, the operative extremity was marked with verbal confirmation of the patient in the holding area.   The patient was then brought to the operating room and transported to the operating room table in the supine position.  A tourniquet was applied to the operative extremity around the upper thigh. The operative limb was then prepped and draped in the usual sterile fashion and preoperative antibiotics were administered.  A time out was performed prior to the start of surgery confirming the correct extremity, preoperative antibiotic administration, as well as team members, implants and instruments available for the case. Correct surgical site was also confirmed with preoperative radiographs. The limb was then elevated for exsanguination and the tourniquet was inflated. A midline incision was made and a standard medial  parapatellar approach was performed.  The infrapatellar fat pad was removed.  Suprapatellar synovium was removed to reveal the anterior distal femoral cortex.  A medial peel was performed to release the capsule of the medial tibial plateau.  The patella was then everted and was prepared and sized to a 35 mm.  A cover was placed on the patella for protection from retractors.  The knee was then brought into full flexion and we then turned our attention to the femur.  The cruciates were sacrificed.  Femoral condyles and tibia had severe chondral wear and osteophyte formation.  Start site was drilled in the femur and the intramedullary distal femoral cutting guide was placed, set at 5 degrees valgus, taking 10 mm of distal resection. The distal cut was made. Osteophytes were then removed.  Next, the proximal tibial cutting guide was placed with appropriate slope, varus/valgus alignment and depth of resection. The proximal tibial cut was made taking 4 mm off the low side. Gap blocks were then used to assess the extension gap and alignment, and appropriate soft tissue releases were performed. Attention was turned back to the femur, which was sized using the sizing guide to a size 8. Appropriate rotation of the femoral component was determined using epicondylar axis, Whiteside's line, and assessing the flexion gap under ligament tension. The appropriate size 4-in-1 cutting block was placed and checked with an angel wing and cuts were made. Posterior femoral osteophytes and uncapped bone were then removed with the curved osteotome.  Trial components were placed, and stability was checked in full extension, mid-flexion, and deep flexion. Proper tibial rotation was determined and marked.  The patella tracked well without a lateral release.  The femoral lugs were then drilled. Trial components were then  removed and tibial preparation performed.  The tibia was sized for a size F component.   The bony surfaces were irrigated  with a pulse lavage and then dried. Bone cement was vacuum mixed on the back table, and the final components sized above were cemented into place.  Antibiotic irrigation was placed in the knee joint and soft tissues while the cement cured.  After cement had finished curing, excess cement was removed. The stability of the construct was re-evaluated throughout a range of motion and found to be acceptable. The trial liner was removed, the knee was copiously irrigated, and the knee was re-evaluated for any excess bone debris. The real polyethylene liner, 10 mm thick, was inserted and checked to ensure the locking mechanism had engaged appropriately. The tourniquet was deflated and hemostasis was achieved. The wound was irrigated with normal saline.  One gram of vancomycin powder was placed in the surgical bed.  Topical 0.25% bupivacaine and meloxicam was placed in the joint for postoperative pain.  Capsular closure was performed with a #1 vicryl, subcutaneous fat closed with a 0 vicryl suture, then subcutaneous tissue closed with interrupted 2.0 vicryl suture. The skin was then closed with a 2.0 nylon and dermabond. A sterile dressing was applied.  The patient was awakened in the operating room and taken to recovery in stable condition. All sponge, needle, and instrument counts were correct at the end of the case.  Tawanna Cooler was necessary for opening, closing, retracting, limb positioning and overall facilitation and completion of the surgery.  Position: supine  Complications: none.  Time Out: performed   Drains/Packing: none  Estimated blood loss: minimal  Returned to Recovery Room: in good condition.   Antibiotics: yes   Mechanical VTE (DVT) Prophylaxis: sequential compression devices, TED thigh-high  Chemical VTE (DVT) Prophylaxis: aspirin  Fluid Replacement  Crystalloid: see anesthesia record Blood: none  FFP: none   Specimens Removed: 1 to pathology   Sponge and Instrument Count  Correct? yes   PACU: portable radiograph - knee AP and Lateral   Plan/RTC: Return in 2 weeks for wound check.   Weight Bearing/Load Lower Extremity: full   Implant Name Type Inv. Item Serial No. Manufacturer Lot No. LRB No. Used Action  CEMENT BONE REFOBACIN [id number] [place] - [id number] Cement CEMENT BONE REFOBACIN [id number] [place]  ZIMMER RECON(ORTH,TRAU,BIO,SG) [id number] Right 2 Implanted  FEMUR CMT CR STD SZ 8 RT KNEE - [id number] Joint FEMUR CMT CR STD SZ 8 RT KNEE  ZIMMER RECON(ORTH,TRAU,BIO,SG) [id number] Right 1 Implanted  TIBIA STEM 5 DEG SZ F R KNEE - [id number] Knees TIBIA STEM 5 DEG SZ F R KNEE  ZIMMER RECON(ORTH,TRAU,BIO,SG) [id number] Right 1 Implanted  STEM POLY PAT PLY 62M KNEE - [id number] Knees STEM POLY PAT PLY 62M KNEE  ZIMMER RECON(ORTH,TRAU,BIO,SG) [id number] Right 1 Implanted  INSERT TIBIA KNEE RIGHT 10 - [id number] Joint INSERT TIBIA KNEE RIGHT 10  ZIMMER RECON(ORTH,TRAU,BIO,SG) [id number] Right 1 Implanted    N. Eduard Roux, MD [facility] 1:45 PM

## 2022-06-08 NOTE — Anesthesia Procedure Notes (Signed)
Anesthesia Regional Block: Adductor canal block   Pre-Anesthetic Checklist: , timeout performed,  Correct Patient, Correct Site, Correct Laterality,  Correct Procedure, Correct Position, site marked,  Risks and benefits discussed,  Surgical consent,  Pre-op evaluation,  At surgeon's request and post-op pain management  Laterality: Right  Prep: chloraprep       Needles:  Injection technique: Single-shot  Needle Type: Stimiplex     Needle Length: 9cm  Needle Gauge: 21     Additional Needles:   Procedures:,,,, ultrasound used (permanent image in chart),,    Narrative:  Start time: 06/08/2022 10:27 AM End time: 06/08/2022 10:32 AM Injection made incrementally with aspirations every 5 mL.  Performed by: Personally  Anesthesiologist: Lynda Rainwater, MD

## 2022-06-08 NOTE — Discharge Instructions (Signed)

## 2022-06-08 NOTE — H&P (Signed)
PREOPERATIVE H&P  Chief Complaint: right knee degenerative joint disease  HPI: Jasmine Davila is a 71 y.o. female who presents for surgical treatment of right knee degenerative joint disease.  She denies any changes in medical history.  Past Medical History:  Diagnosis Date   Abnormal EKG    hx of right bundle branch block   Acute pancreatitis    HX OF   Anemia yrs ago   Breast cancer ([place]) 2021   Cancer ([place]) dx 2016   B cell lymphoma- Non Hodgkins in remission   Diabetes mellitus ([place])    TYPE 2   Diabetic neuropathy (HCC)    Diabetic neuropathy (HCC)    Endometrial disorder    GERD (gastroesophageal reflux disease)    Headache    tension or sinus   Hypertension    Malignant lymphoma ([place])    Morbid obesity ([place])    Peripheral neuropathy    BOTH FEET, SLIGHT NEUROPATHY IN HANDS   Sleep apnea    USES CPAP at times   Vitamin B 12 deficiency    Past Surgical History:  Procedure Laterality Date   BREAST LUMPECTOMY Right 2021   CHOLECYSTECTOMY  03/06/1996   colomscopy     DILATATION & CURETTAGE/HYSTEROSCOPY WITH MYOSURE N/A 07/04/2018   Procedure: DILATATION & CURETTAGE/HYSTEROSCOPY WITH MYOSURE;  Surgeon: Salvadore Dom, MD;  Location: [place];  Service: Gynecology;  Laterality: N/A;   MEDIPORT INSERTION, DOUBLE     MEDIPORT REMOVAL     Social History   Socioeconomic History   Marital status: Married    Spouse name: Dorena Bodo"   Number of children: 4   Years of education: 12   Highest education level: Not on file  Occupational History   Occupation: retired from medical billing  Tobacco Use   Smoking status: Former    Packs/day: 0.50    Years: 4.00    Total pack years: 2.00    Types: Cigarettes    Quit date: 12/21/1968    Years since quitting: 53.4   Smokeless tobacco: Never   Tobacco comments:    as teenager  Vaping Use   Vaping Use: Never used  Substance and Sexual Activity   Alcohol use: No   Drug use: No    Sexual activity: Not Currently    Birth control/protection: None  Other Topics Concern   Not on file  Social History Narrative   Lives with husband in a one story home.  Has 3 living children.  One child died in a MVA.  Retired from [employer].     Education: high school.   Social Determinants of Health   Financial Resource Strain: Not on file  Food Insecurity: Not on file  Transportation Needs: Not on file  Physical Activity: Not on file  Stress: Not on file  Social Connections: Not on file   Family History  Problem Relation Age of Onset   Cancer Mother 72       colon   Hypertension Mother    Osteoporosis Mother    Heart attack Father    Heart disease Sister    Alcohol abuse Maternal Grandfather    Allergies  Allergen Reactions   Doxycycline Other (See Comments)    Pancreatic issues   Metronidazole Other (See Comments)    Cream  Burned face   Dilaudid [Hydromorphone Hcl] Anxiety   Prior to Admission medications   Medication Sig Start Date End Date Taking? Authorizing Provider  aspirin EC  81 MG tablet Take 81 mg by mouth at bedtime.    Yes [provider]  aspirin EC 81 MG tablet Take 1 tablet (81 mg total) by mouth 2 (two) times daily. To be taken after surgery to prevent blood clots 04/20/22   Aundra Dubin, PA-C  carboxymethylcellulose (REFRESH TEARS) 0.5 % SOLN Place 1 drop into both eyes 3 (three) times daily as needed (dry eyes).   Yes [provider]  cetirizine (ALLERGY, CETIRIZINE,) 10 MG tablet Take 10 mg by mouth daily.   Yes [provider]  Cholecalciferol (VITAMIN D-3) 5000 units TABS Take 1 tablet by mouth daily.   Yes [provider]  famotidine (PEPCID) 20 MG tablet TAKE 1 TABLET BY MOUTH TWICE  DAILY 03/05/22  Yes Abonza, Maritza, PA-C  fluticasone (FLONASE) 50 MCG/ACT nasal spray PLACE 1 SPRAY INTO BOTH NOSTRILS DAILY AS NEEDED FOR ALLERGIES OR RHINITIS. 01/30/19  Yes Danford, Valetta Fuller D, NP  folic acid (FOLVITE)  390 MCG tablet Take 400 mcg by mouth daily.   Yes [provider]  gabapentin (NEURONTIN) 600 MG tablet TAKE 2 TABLETS BY MOUTH 3 TIMES  DAILY 03/16/22  Yes Abonza, Maritza, PA-C  ibuprofen (ADVIL) 200 MG tablet Take 400 mg by mouth every 6 (six) hours as needed for moderate pain or headache.   Yes [provider]  insulin glargine (LANTUS SOLOSTAR) 100 UNIT/ML Solostar Pen Inject 70 Units into the skin daily. 03/20/22  Yes Abonza, Maritza, PA-C  insulin lispro (HUMALOG KWIKPEN) 100 UNIT/ML KwikPen Inject 20 Units into the skin 3 (three) times daily. INJECT 20 UNITS 3 TIMES A DAY WITH MEALS 03/20/22  Yes Abonza, Maritza, PA-C  Multiple Vitamin (MULTIVITAMIN) tablet Take 1 tablet by mouth daily.   Yes [provider]  NIACIN PO Take 1,000 mg by mouth at bedtime. Take 1 hour after the 81 mg aspirin   Yes [provider]  ondansetron (ZOFRAN) 4 MG tablet Take 1 tablet (4 mg total) by mouth every 8 (eight) hours as needed for nausea or vomiting. 04/20/22   Aundra Dubin, PA-C  aspirin EC 81 MG tablet Take 1 tablet (81 mg total) by mouth 2 (two) times daily as needed. 06/02/22   Aundra Dubin, PA-C  Continuous Blood Gluc Receiver (DEXCOM G6 RECEIVER) DEVI Use to check blood sugars every morning fasting and 2 hours after largest meal 11/27/20   Abonza, Maritza, PA-C  Continuous Blood Gluc Sensor (DEXCOM G6 SENSOR) MISC Apply new sensor every 10 days.  Use to check blood sugars every morning fasting and 2 hours after largest meal 11/27/20   Abonza, Maritza, PA-C  Continuous Blood Gluc Transmit (DEXCOM G6 TRANSMITTER) MISC Use to check blood sugars every morning fasting and 2 hours after largest meal 11/27/20   Abonza, Maritza, PA-C  docusate sodium (COLACE) 100 MG capsule Take 1 capsule (100 mg total) by mouth daily as needed. 06/02/22 06/02/23  Aundra Dubin, PA-C  Insulin Pen Needle (B-D UF III MINI PEN NEEDLES) 31G X 5 MM MISC Use as directed. 10/07/21   Lorrene Reid,  PA-C  losartan (COZAAR) 25 MG tablet TAKE 1 TABLET BY MOUTH DAILY 05/14/22   Abonza, Maritza, PA-C  methocarbamol (ROBAXIN) 500 MG tablet Take 1 tablet (500 mg total) by mouth 2 (two) times daily as needed. 06/02/22   Aundra Dubin, PA-C  ondansetron (ZOFRAN) 4 MG tablet Take 1 tablet (4 mg total) by mouth every 8 (eight) hours as needed for nausea or vomiting. 06/02/22  Aundra Dubin, PA-C  oxyCODONE-acetaminophen (PERCOCET) 5-325 MG tablet Take 1-2 tablets by mouth every 6 (six) hours as needed. To be taken after surgery 06/02/22   Aundra Dubin, PA-C  sulfamethoxazole-trimethoprim (BACTRIM DS) 800-160 MG tablet Take 1 tablet by mouth 2 (two) times daily. To be taken after surgery 06/02/22   Aundra Dubin, PA-C     Positive ROS: All other systems have been reviewed and were otherwise negative with the exception of those mentioned in the HPI and as above.  Physical Exam: General: Alert, no acute distress Cardiovascular: No pedal edema Respiratory: No cyanosis, no use of accessory musculature GI: abdomen soft Skin: No lesions in the area of chief complaint Neurologic: Sensation intact distally Psychiatric: Patient is competent for consent with normal mood and affect Lymphatic: no lymphedema  MUSCULOSKELETAL: exam stable  Assessment: right knee degenerative joint disease  Plan: Plan for Procedure(s): RIGHT TOTAL KNEE ARTHROPLASTY  The risks benefits and alternatives were discussed with the patient including but not limited to the risks of nonoperative treatment, versus surgical intervention including infection, bleeding, nerve injury,  blood clots, cardiopulmonary complications, morbidity, mortality, among others, and they were willing to proceed.   Preoperative templating of the joint replacement has been completed, documented, and submitted to the Operating Room personnel in order to optimize intra-operative equipment management.   Eduard Roux, MD 06/08/2022 9:28 AM

## 2022-06-08 NOTE — Anesthesia Procedure Notes (Signed)
Procedure Name: LMA Insertion Date/Time: 06/08/2022 12:32 PM  Performed by: Jenne Campus, CRNAPre-anesthesia Checklist: Patient identified, Emergency Drugs available, Suction available and Patient being monitored Patient Re-evaluated:Patient Re-evaluated prior to induction Oxygen Delivery Method: Circle System Utilized Preoxygenation: Pre-oxygenation with 100% oxygen Induction Type: IV induction Ventilation: Mask ventilation without difficulty LMA: LMA inserted LMA Size: 4.0 Number of attempts: 1 Airway Equipment and Method: Bite block Placement Confirmation: positive ETCO2 and breath sounds checked- equal and bilateral Tube secured with: Tape Dental Injury: Teeth and Oropharynx as per pre-operative assessment

## 2022-06-08 NOTE — Anesthesia Procedure Notes (Signed)
Spinal  Patient location during procedure: OR Start time: 06/08/2022 11:43 AM End time: 06/08/2022 11:48 AM Reason for block: surgical anesthesia Staffing Performed: anesthesiologist  Anesthesiologist: Lynda Rainwater, MD Performed by: Lynda Rainwater, MD Authorized by: Lynda Rainwater, MD   Preanesthetic Checklist Completed: patient identified, IV checked, site marked, risks and benefits discussed, surgical consent, monitors and equipment checked, pre-op evaluation and timeout performed Spinal Block Patient position: sitting Prep: DuraPrep Patient monitoring: heart rate, cardiac monitor, continuous pulse ox and blood pressure Approach: midline Location: L3-4 Injection technique: single-shot Needle Needle type: Quincke  Needle gauge: 22 G Needle length: 9 cm Assessment Sensory level: T4 Events: CSF return

## 2022-06-08 NOTE — Evaluation (Signed)
Physical Therapy Evaluation Patient Details Name: Jasmine Davila MRN: [id number] DOB: [birth year] Today's Date: 06/08/2022  History of Present Illness  Pt underwent Rt TKR on 06/08/22. PMH - breast CA, DM, neuropathy, HTN, lymphoma, morbid obesity  Clinical Impression  Pt presents to PT with decreased mobility due to decreased strength/ROM of rt knee as well as expected post op pain. Expect pt will make excellent progress with all mobility.  Pt has good support at home and plans to return there at DC.         Recommendations for follow up therapy are one component of a multi-disciplinary discharge planning process, led by the attending physician.  Recommendations may be updated based on patient status, additional functional criteria and insurance authorization.  Follow Up Recommendations Follow physician's recommendations for discharge plan and follow up therapies    Assistance Recommended at Discharge Intermittent Supervision/Assistance  Patient can return home with the following  Help with stairs or ramp for entrance    Equipment Recommendations None recommended by PT  Recommendations for Other Services       Functional Status Assessment Patient has had a recent decline in their functional status and demonstrates the ability to make significant improvements in function in a reasonable and predictable amount of time.     Precautions / Restrictions Precautions Precautions: Knee;Fall Restrictions Weight Bearing Restrictions: No      Mobility  Bed Mobility Overal bed mobility: Needs Assistance Bed Mobility: Supine to Sit     Supine to sit: Supervision, HOB elevated     General bed mobility comments: Assist for safety. Incr time.    Transfers Overall transfer level: Needs assistance Equipment used: Rolling walker (2 wheels) Transfers: Sit to/from Stand Sit to Stand: Min assist           General transfer comment: Assist to power up and verbal cues for hand  placement    Ambulation/Gait Ambulation/Gait assistance: Min guard Gait Distance (Feet): 12 Feet Assistive device: Rolling walker (2 wheels) Gait Pattern/deviations: Step-to pattern, Decreased step length - right, Decreased step length - left Gait velocity: decr Gait velocity interpretation: <1.31 ft/sec, indicative of household ambulator   General Gait Details: Assist for safety. Pt uses vision to monitor feet due to peripheral neuropathy  Stairs            Wheelchair Mobility    Modified Rankin (Stroke Patients Only)       Balance Overall balance assessment: Needs assistance Sitting-balance support: No upper extremity supported, Feet supported Sitting balance-Leahy Scale: Normal     Standing balance support: Bilateral upper extremity supported, During functional activity Standing balance-Leahy Scale: Poor Standing balance comment: walker and min guard for static standing                             Pertinent Vitals/Pain Pain Assessment Pain Assessment: 0-10 Pain Score: 6  Pain Location: rt knee Pain Descriptors / Indicators: Aching    Home Living Family/patient expects to be discharged to:: Private residence Living Arrangements: Spouse/significant other Available Help at Discharge: Family;Friend(s) Type of Home: House Home Access: Stairs to enter Entrance Stairs-Rails: Right;Left;Can reach both Entrance Stairs-Number of Steps: 2   Home Layout: One level Home Equipment: Conservation officer, nature (2 wheels) Additional Comments: Has been caring for husband who has multiple med problems but he is now better and friends etc can assist as needed.    Prior Function Prior Level of Function : Independent/Modified Independent;Driving  Mobility Comments: No assistive device ADLs Comments: Independent     Hand Dominance        Extremity/Trunk Assessment   Upper Extremity Assessment Upper Extremity Assessment: Overall WFL for tasks  assessed    Lower Extremity Assessment Lower Extremity Assessment: RLE deficits/detail;LLE deficits/detail RLE Deficits / Details: Fair quad set, AAROM knee 5-90 RLE Sensation: history of peripheral neuropathy LLE Sensation: history of peripheral neuropathy       Communication   Communication: No difficulties  Cognition Arousal/Alertness: Awake/alert Behavior During Therapy: WFL for tasks assessed/performed Overall Cognitive Status: Within Functional Limits for tasks assessed                                          General Comments General comments (skin integrity, edema, etc.): SpO 96% on RA after amb. Left O2 off    Exercises Total Joint Exercises Quad Sets: AROM, Right, 5 reps, Supine Long Arc Quad: AROM, Right, 5 reps, Seated Knee Flexion: AAROM, Right, 5 reps, Seated Goniometric ROM: 5-90   Assessment/Plan    PT Assessment Patient needs continued PT services  PT Problem List Decreased strength;Decreased range of motion;Decreased mobility;Decreased balance;Pain       PT Treatment Interventions DME instruction;Gait training;Stair training;Functional mobility training;Therapeutic activities;Therapeutic exercise;Patient/family education;Balance training    PT Goals (Current goals can be found in the Care Plan section)  Acute Rehab PT Goals Patient Stated Goal: return home PT Goal Formulation: With patient Time For Goal Achievement: 06/12/22 Potential to Achieve Goals: Good    Frequency 7X/week     Co-evaluation               AM-PAC PT "6 Clicks" Mobility  Outcome Measure Help needed turning from your back to your side while in a flat bed without using bedrails?: None Help needed moving from lying on your back to sitting on the side of a flat bed without using bedrails?: A Little Help needed moving to and from a bed to a chair (including a wheelchair)?: A Little Help needed standing up from a chair using your arms (e.g., wheelchair or  bedside chair)?: A Little Help needed to walk in hospital room?: A Little Help needed climbing 3-5 steps with a railing? : A Little 6 Click Score: 19    End of Session   Activity Tolerance: Patient tolerated treatment well Patient left: in chair;with call bell/phone within reach;with chair alarm set Nurse Communication: Mobility status;Other (comment) (Left O2 off) PT Visit Diagnosis: Other abnormalities of gait and mobility (R26.89);Pain Pain - Right/Left: Right Pain - part of body: Knee    Time: 1750-1820 PT Time Calculation (min) (ACUTE ONLY): 30 min   Charges:   PT Evaluation $PT Eval Low Complexity: 1 Low PT Treatments $Gait Training: 8-22 mins        Nectar 06/08/2022, 6:35 PM

## 2022-06-08 NOTE — Anesthesia Procedure Notes (Signed)
Procedure Name: MAC Date/Time: 06/08/2022 11:51 AM  Performed by: Jenne Campus, CRNAPre-anesthesia Checklist: Patient identified, Emergency Drugs available, Suction available and Patient being monitored Oxygen Delivery Method: Simple face mask

## 2022-06-08 NOTE — Transfer of Care (Signed)
Immediate Anesthesia Transfer of Care Note  Patient: Jasmine Davila  Procedure(s) Performed: RIGHT TOTAL KNEE REPLACEMENT (Right: Knee)  Patient Location: PACU  Anesthesia Type:General and Spinal  Level of Consciousness: oriented, drowsy and patient cooperative  Airway & Oxygen Therapy: Patient Spontanous Breathing and Patient connected to nasal cannula oxygen  Post-op Assessment: Report given to RN and Post -op Vital signs reviewed and stable  Post vital signs: Reviewed  Last Vitals:  Vitals Value Taken Time  BP 143/72 06/08/22 1437  Temp    Pulse 67 06/08/22 1441  Resp 12 06/08/22 1441  SpO2 100 % 06/08/22 1441  Vitals shown include unvalidated device data.  Last Pain:  Vitals:   06/08/22 1046  TempSrc:   PainSc: 0-No pain         Complications: No notable events documented.

## 2022-06-09 ENCOUNTER — Encounter (HOSPITAL_COMMUNITY): Payer: Self-pay | Admitting: Orthopaedic Surgery

## 2022-06-09 DIAGNOSIS — M1711 Unilateral primary osteoarthritis, right knee: Secondary | ICD-10-CM | POA: Diagnosis not present

## 2022-06-09 LAB — CBC
HCT: 31.2 % — ABNORMAL LOW (ref 36.0–46.0)
Hemoglobin: 10.9 g/dL — ABNORMAL LOW (ref 12.0–15.0)
MCH: 29.2 pg (ref 26.0–34.0)
MCHC: 34.9 g/dL (ref 30.0–36.0)
MCV: 83.6 fL (ref 80.0–100.0)
Platelets: 132 10*3/uL — ABNORMAL LOW (ref 150–400)
RBC: 3.73 MIL/uL — ABNORMAL LOW (ref 3.87–5.11)
RDW: 12.5 % (ref 11.5–15.5)
WBC: 7.9 10*3/uL (ref 4.0–10.5)
nRBC: 0 % (ref 0.0–0.2)

## 2022-06-09 LAB — GLUCOSE, CAPILLARY: Glucose-Capillary: 170 mg/dL — ABNORMAL HIGH (ref 70–99)

## 2022-06-09 NOTE — Progress Notes (Signed)
Physical Therapy Treatment Patient Details Name: Jasmine Davila MRN: [id number] DOB: 1951-02-23 Today's Date: 06/09/2022   History of Present Illness Pt underwent Rt TKR on 06/08/22. PMH - breast CA, DM, neuropathy, HTN, lymphoma, morbid obesity    PT Comments    Continuing work on functional mobility and activity tolerance;  Session focused on progressive ambulation and stair training; Walking household distance well, with nice stable R knee in stance; No difficutly with stairs; Questions solicited and answered; OK for dc home from PT standpoint, pt's Nurse notified.   Recommendations for follow up therapy are one component of a multi-disciplinary discharge planning process, led by the attending physician.  Recommendations may be updated based on patient status, additional functional criteria and insurance authorization.  Follow Up Recommendations  Follow physician's recommendations for discharge plan and follow up therapies     Assistance Recommended at Discharge Intermittent Supervision/Assistance  Patient can return home with the following     Equipment Recommendations  None recommended by PT (well-equipped)    Recommendations for Other Services       Precautions / Restrictions Precautions Precautions: Knee;Fall Precaution Booklet Issued: Yes (comment) Precaution Comments: Pt educated to not allow any pillow or bolster under knee for healing with optimal range of motion.  Restrictions Weight Bearing Restrictions: No     Mobility  Bed Mobility                    Transfers Overall transfer level: Needs assistance Equipment used: Rolling walker (2 wheels) Transfers: Sit to/from Stand Sit to Stand: Supervision           General transfer comment: Slow but steady rise    Ambulation/Gait Ambulation/Gait assistance: Min guard, Supervision Gait Distance (Feet): 120 Feet Assistive device: Rolling walker (2 wheels) Gait Pattern/deviations:  Step-through pattern (emerging) Gait velocity: decr     General Gait Details: Nice, stable R knee in stance; no buckling or instability in stance; Pt uses vision to monitor feet due to peripheral neuropathy   Stairs Stairs: Yes Stairs assistance: Supervision Stair Management: Two rails, One rail Right, One rail Left, Forwards, Sideways, Step to pattern Number of Stairs: 5 (x2) General stair comments: Cues for sequence; first bout used both rails, second bout used 1 rail, R rail ascending, L rail descending; no difficulty   Wheelchair Mobility    Modified Rankin (Stroke Patients Only)       Balance     Sitting balance-Leahy Scale: Normal       Standing balance-Leahy Scale: Fair                              Cognition Arousal/Alertness: Awake/alert Behavior During Therapy: WFL for tasks assessed/performed Overall Cognitive Status: Within Functional Limits for tasks assessed                                          Exercises Total Joint Exercises Goniometric ROM: 0-90 (approx)    General Comments General comments (skin integrity, edema, etc.): Discussed car transfers and performing chest opening stretches daily, to counter balance looking down a lot while wlaking for safety      Pertinent Vitals/Pain Pain Assessment Pain Assessment: 0-10 Pain Score: 4  Pain Location: R knee during ambulation Pain Descriptors / Indicators: Sore, Grimacing Pain Intervention(s): Monitored during session    Home Living  Family/patient expects to be discharged to:: Private residence Living Arrangements: Spouse/significant other Available Help at Discharge: Family;Friend(s) Type of Home: House Home Access: Stairs to enter Entrance Stairs-Rails: Right;Left;Can reach both Entrance Stairs-Number of Steps: 2   Home Layout: One level Home Equipment: Rolling Walker (2 wheels);Grab bars - toilet;Grab bars - tub/shower;Hand held shower head;Shower seat -  built in Additional Comments: Husband can provide light assist, son can assist prn as well    Prior Function            PT Goals (current goals can now be found in the care plan section) Acute Rehab PT Goals Patient Stated Goal: return home today PT Goal Formulation: With patient Time For Goal Achievement: 06/12/22 Potential to Achieve Goals: Good Progress towards PT goals: Progressing toward goals    Frequency    7X/week      PT Plan Current plan remains appropriate    Co-evaluation              AM-PAC PT "6 Clicks" Mobility   Outcome Measure  Help needed turning from your back to your side while in a flat bed without using bedrails?: None Help needed moving from lying on your back to sitting on the side of a flat bed without using bedrails?: None Help needed moving to and from a bed to a chair (including a wheelchair)?: A Little Help needed standing up from a chair using your arms (e.g., wheelchair or bedside chair)?: A Little Help needed to walk in hospital room?: A Little Help needed climbing 3-5 steps with a railing? : A Little 6 Click Score: 20    End of Session Equipment Utilized During Treatment: Gait belt Activity Tolerance: Patient tolerated treatment well Patient left: in chair;with call bell/phone within reach;with chair alarm set Nurse Communication: Mobility status;Other (comment) (OK for dc from PT standpoint) PT Visit Diagnosis: Other abnormalities of gait and mobility (R26.89);Pain Pain - Right/Left: Right Pain - part of body: Knee     Time: 0539-7673 PT Time Calculation (min) (ACUTE ONLY): 38 min  Charges:  $Gait Training: 23-37 mins $Therapeutic Activity: 8-22 mins                     Roney Marion, Ages Office [phone]    Colletta Maryland 06/09/2022, 10:46 AM

## 2022-06-09 NOTE — Progress Notes (Signed)
Subjective: 1 Day Post-Op Procedure(s) (LRB): RIGHT TOTAL KNEE REPLACEMENT (Right) Patient reports pain as mild.    Objective: Vital signs in last 24 hours: Temp:  [97.6 F (36.4 C)-98.5 F (36.9 C)] 97.7 F (36.5 C) (06/20 0353) Pulse Rate:  [49-69] 69 (06/20 0353) Resp:  [10-18] 18 (06/20 0353) BP: (124-155)/(55-77) 140/55 (06/20 0353) SpO2:  [92 %-100 %] 96 % (06/20 0353) Weight:  [108.9 kg] 108.9 kg (06/19 0937)  Intake/Output from previous day: 06/19 0701 - 06/20 0700 In: 2190.7 [I.V.:1840.7; IV Piggyback:350] Out: 0786 [Urine:3500; Blood:50] Intake/Output this shift: No intake/output data recorded.  Recent Labs    06/09/22 0600  HGB 10.9*   Recent Labs    06/09/22 0600  WBC 7.9  RBC 3.73*  HCT 31.2*  PLT 132*   No results for input(s): "NA", "K", "CL", "CO2", "BUN", "CREATININE", "GLUCOSE", "CALCIUM" in the last 72 hours. No results for input(s): "LABPT", "INR" in the last 72 hours.  Neurologically intact Neurovascular intact Sensation intact distally Intact pulses distally Dorsiflexion/Plantar flexion intact Incision: dressing C/D/I No cellulitis present Compartment soft   Assessment/Plan: 1 Day Post-Op Procedure(s) (LRB): RIGHT TOTAL KNEE REPLACEMENT (Right) Advance diet Up with therapy D/C IV fluids Discharge home with home health after second PT session as long as she is cleared by PT WBAT RLE ABLA- mild and stable   Anticipated LOS equal to or greater than 2 midnights due to - Age 72 and older with one or more of the following:  - Obesity  - Expected need for hospital services (PT, OT, Nursing) required for safe  discharge  - Anticipated need for postoperative skilled nursing care or inpatient rehab  - Active co-morbidities: Diabetes OR   - Unanticipated findings during/Post Surgery: None  - Patient is a high risk of re-admission due to: None   Aundra Dubin 06/09/2022, 7:55 AM

## 2022-06-09 NOTE — Anesthesia Postprocedure Evaluation (Signed)
Anesthesia Post Note  Patient: Jasmine Davila  Procedure(s) Performed: RIGHT TOTAL KNEE REPLACEMENT (Right: Knee)     Patient location during evaluation: PACU Anesthesia Type: Regional and General Level of consciousness: awake and alert Pain management: pain level controlled Vital Signs Assessment: post-procedure vital signs reviewed and stable Respiratory status: spontaneous breathing, nonlabored ventilation, respiratory function stable and patient connected to nasal cannula oxygen Cardiovascular status: blood pressure returned to baseline and stable Postop Assessment: no apparent nausea or vomiting Anesthetic complications: no Comments: Failed spinal   No notable events documented.  Last Vitals:  Vitals:   06/08/22 1913 06/09/22 0353  BP: 139/60 (!) 140/55  Pulse: 63 69  Resp: 16 18  Temp: 36.6 C 36.5 C  SpO2: 92% 96%    Last Pain:  Vitals:   06/09/22 0353  TempSrc:   PainSc: Elsie

## 2022-06-09 NOTE — Evaluation (Signed)
 Occupational Therapy Evaluation/Discharge Patient Details Name: Jasmine Davila MRN: [id number] DOB: Jan 26, 1951 Today's Date: 06/09/2022   History of Present Illness Pt underwent Rt TKR on 06/08/22. PMH - breast CA, DM, neuropathy, HTN, lymphoma, morbid obesity   Clinical Impression   PTA, pt lives with spouse and typically Independent in all ADLs, IADLs and mobility without AD. Pt presents now with minor post op pain. Educated pt re: compensatory strategies for LB ADLs, use of DME in the home, and knee precautions with pt verbalizing understanding. Overall, pt able to complete ADLs/short distance mobility using RW with no more than Supervision. Pt reports her spouse can provide light assist and son assisting initially at DC as well. Anticipate no further skilled OT services needed at this time.       Recommendations for follow up therapy are one component of a multi-disciplinary discharge planning process, led by the attending physician.  Recommendations may be updated based on patient status, additional functional criteria and insurance authorization.   Follow Up Recommendations  No OT follow up    Assistance Recommended at Discharge Set up Supervision/Assistance  Patient can return home with the following Assistance with cooking/housework;Assist for transportation;Help with stairs or ramp for entrance    Functional Status Assessment  Patient has had a recent decline in their functional status and demonstrates the ability to make significant improvements in function in a reasonable and predictable amount of time.  Equipment Recommendations  None recommended by OT    Recommendations for Other Services       Precautions / Restrictions Precautions Precautions: Knee;Fall Restrictions Weight Bearing Restrictions: No      Mobility Bed Mobility Overal bed mobility: Modified Independent Bed Mobility: Supine to Sit     Supine to sit: Modified independent (Device/Increase  time)     General bed mobility comments: increased time    Transfers Overall transfer level: Needs assistance Equipment used: Rolling walker (2 wheels) Transfers: Sit to/from Stand Sit to Stand: Supervision           General transfer comment: increased time to problem solve hand placement but pt able to stand x 2 during ADLs without assist      Balance Overall balance assessment: Needs assistance Sitting-balance support: No upper extremity supported, Feet supported Sitting balance-Leahy Scale: Normal     Standing balance support: Bilateral upper extremity supported, During functional activity Standing balance-Leahy Scale: Fair Standing balance comment: able to stand without UE support during LB ADLs. RW needed for mobility to offload pressure from operative LE                           ADL either performed or assessed with clinical judgement   ADL Overall ADL's : Needs assistance/impaired Eating/Feeding: Independent   Grooming: Modified independent;Standing   Upper Body Bathing: Modified independent;Sitting   Lower Body Bathing: Supervison/ safety;Sit to/from stand   Upper Body Dressing : Modified independent   Lower Body Dressing: Supervision/safety;Sit to/from stand;Sitting/lateral leans Lower Body Dressing Details (indicate cue type and reason): minor problem solving cues (pointing R toe to get underwear around heel, etc). Pt automatically donning affected LE and able to manage with increased time Toilet Transfer: Supervision/safety;Ambulation;Rolling walker (2 wheels)   Toileting- Clothing Manipulation and Hygiene: Supervision/safety;Sitting/lateral lean;Sit to/from stand         General ADL Comments: Educated re: compensatory strategies for LB ADLs, carrying items with DME in the home, where assist may be needed and positioning of R  knee when resting     Vision Ability to See in Adequate Light: 0 Adequate Patient Visual Report: No change from  baseline Vision Assessment?: No apparent visual deficits     Perception     Praxis      Pertinent Vitals/Pain Pain Assessment Pain Assessment: Faces Faces Pain Scale: Hurts a little bit Pain Location: R knee Pain Descriptors / Indicators: Sore, Grimacing Pain Intervention(s): Monitored during session     Hand Dominance Right   Extremity/Trunk Assessment Upper Extremity Assessment Upper Extremity Assessment: Overall WFL for tasks assessed   Lower Extremity Assessment Lower Extremity Assessment: Defer to PT evaluation   Cervical / Trunk Assessment Cervical / Trunk Assessment: Normal   Communication Communication Communication: No difficulties   Cognition Arousal/Alertness: Awake/alert Behavior During Therapy: WFL for tasks assessed/performed Overall Cognitive Status: Within Functional Limits for tasks assessed                                       General Comments       Exercises     Shoulder Instructions      Home Living Family/patient expects to be discharged to:: Private residence Living Arrangements: Spouse/significant other Available Help at Discharge: Family;Friend(s) Type of Home: House Home Access: Stairs to enter Technical  of Steps: 2 Entrance Stairs-Rails: Right;Left;Can reach both Home Layout: One level     Bathroom Shower/Tub: Occupational psychologist: Handicapped height     Home Equipment: Conservation officer, nature (2 wheels);Grab bars - toilet;Grab bars - tub/shower;Hand held shower head;Shower seat - built in   Additional Comments: Husband can provide light assist, son can assist prn as well      Prior Functioning/Environment Prior Level of Function : Independent/Modified Independent;Driving             Mobility Comments: No assistive device ADLs Comments: Independent; has been assisting husband who has had health issues        OT Problem List: Decreased activity tolerance;Impaired balance (sitting  and/or standing);Pain;Decreased knowledge of use of DME or AE      OT Treatment/Interventions:      OT Goals(Current goals can be found in the care plan section) Acute Rehab OT Goals Patient Stated Goal: home today, recover well OT Goal Formulation: All assessment and education complete, DC therapy  OT Frequency:      Co-evaluation              AM-PAC OT "6 Clicks" Daily Activity     Outcome Measure Help from another person eating meals?: None Help from another person taking care of personal grooming?: None Help from another person toileting, which includes using toliet, bedpan, or urinal?: A Little Help from another person bathing (including washing, rinsing, drying)?: A Little Help from another person to put on and taking off regular upper body clothing?: None Help from another person to put on and taking off regular lower body clothing?: A Little 6 Click Score: 21   End of Session Equipment Utilized During Treatment: Rolling walker (2 wheels) Nurse Communication: Mobility status  Activity Tolerance: Patient tolerated treatment well Patient left: in chair;with call bell/phone within reach;with nursing/sitter in room  OT Visit Diagnosis: Pain Pain - Right/Left: Right Pain - part of body: Knee                Time: 6222-9798 OT Time Calculation (min): 21 min Charges:  OT General Charges $  OT Visit: 1 Visit OT Evaluation $OT Eval Low Complexity: 1 Low  Malachy Chamber, OTR/L Acute Rehab Services Office: [phone]   Layla Maw 06/09/2022, 8:58 AM

## 2022-06-09 NOTE — Plan of Care (Signed)
  Problem: Education: Goal: Knowledge of General Education information will improve Description: Including pain rating scale, medication(s)/side effects and non-pharmacologic comfort measures Outcome: Progressing   Problem: Health Behavior/Discharge Planning: Goal: Ability to manage health-related needs will improve Outcome: Progressing   Problem: Clinical Measurements: Goal: Ability to maintain clinical measurements within normal limits will improve Outcome: Progressing   Problem: Nutrition: Goal: Adequate nutrition will be maintained Outcome: Progressing   Problem: Pain Managment: Goal: General experience of comfort will improve Outcome: Progressing   Problem: Safety: Goal: Ability to remain free from injury will improve Outcome: Progressing   

## 2022-06-09 NOTE — Discharge Summary (Cosign Needed)
Patient ID: Jasmine Davila MRN: [id number] DOB/AGE: 72-13-1952 72 y.o.  Admit date: 06/08/2022 Discharge date: 06/09/2022  Admission Diagnoses:  Principal Problem:   Primary osteoarthritis of right knee Active Problems:   Status post total right knee replacement   Discharge Diagnoses:  Same  Past Medical History:  Diagnosis Date   Abnormal EKG    hx of right bundle branch block   Acute pancreatitis    HX OF   Anemia yrs ago   Breast cancer ([place]) 2021   Cancer ([place]) dx 2016   B cell lymphoma- Non Hodgkins in remission   Diabetes mellitus ([place])    TYPE 2   Diabetic neuropathy (H[facility])    Diabetic neuropathy (HCC)    Endometrial disorder    GERD (gastroesophageal reflux disease)    Headache    tension or sinus   Hypertension    Malignant lymphoma ([place])    Morbid obesity ([place])    Peripheral neuropathy    BOTH FEET, SLIGHT NEUROPATHY IN HANDS   Sleep apnea    USES CPAP at times   Vitamin B 12 deficiency     Surgeries: Procedure(s): RIGHT TOTAL KNEE REPLACEMENT on 06/08/2022   Consultants:   Discharged Condition: Improved  Hospital Course: Jasmine Davila is an 71 y.o. female who was admitted 06/08/2022 for operative treatment ofPrimary osteoarthritis of right knee. Patient has severe unremitting pain that affects sleep, daily activities, and work/hobbies. After pre-op clearance the patient was taken to the operating room on 06/08/2022 and underwent  Procedure(s): RIGHT TOTAL KNEE REPLACEMENT.    Patient was given perioperative antibiotics:  Anti-infectives (From admission, onward)    Start     Dose/Rate Route Frequency Ordered Stop   06/08/22 1800  ceFAZolin (ANCEF) IVPB 2g/100 mL premix        2 g 200 mL/hr over 30 Minutes Intravenous Every 6 hours 06/08/22 1555 06/09/22 0104   06/08/22 1237  vancomycin (VANCOCIN) powder  Status:  Discontinued          As needed 06/08/22 1238 06/08/22 1432   06/08/22 0930  ceFAZolin (ANCEF) IVPB 2g/100 mL premix         2 g 200 mL/hr over 30 Minutes Intravenous On call to O.R. 06/08/22 2595 06/08/22 1217   06/08/22 0926  ceFAZolin (ANCEF) 2-4 GM/100ML-% IVPB       Note to Pharmacy: Rocky Morel D: cabinet override      06/08/22 0926 06/08/22 1211        Patient was given sequential compression devices, early ambulation, and chemoprophylaxis to prevent DVT.  Patient benefited maximally from hospital stay and there were no complications.    Recent vital signs: Patient Vitals for the past 24 hrs:  BP Temp Temp src Pulse Resp SpO2 Height Weight  06/09/22 0353 (!) 140/55 97.7 F (36.5 C) -- 69 18 96 % -- --  06/08/22 1913 139/60 97.9 F (36.6 C) -- 63 16 92 % -- --  06/08/22 1556 (!) 151/73 98.5 F (36.9 C) -- 64 17 100 % -- --  06/08/22 1538 (!) 145/74 97.8 F (36.6 C) -- 66 16 97 % -- --  06/08/22 1523 (!) 142/57 -- -- 60 13 96 % -- --  06/08/22 1507 (!) 155/68 -- -- (!) 59 14 99 % -- --  06/08/22 1436 (!) 143/72 97.6 F (36.4 C) -- 63 -- 98 % -- --  06/08/22 1120 (!) 141/62 -- -- (!) 50 10 100 % -- --  06/08/22 1110 131/64 -- -- Marland Kitchen  51 10 100 % -- --  06/08/22 1100 139/65 -- -- (!) 49 13 100 % -- --  06/08/22 1055 124/62 -- -- (!) 49 14 100 % -- --  06/08/22 1050 (!) 126/57 -- -- (!) 56 10 100 % -- --  06/08/22 1045 129/65 -- -- (!) 58 14 98 % -- --  06/08/22 1040 (!) 131/59 -- -- (!) 56 17 100 % -- --  06/08/22 1035 127/62 -- -- (!) 58 12 95 % -- --  06/08/22 1030 131/65 -- -- (!) 56 12 97 % -- --  06/08/22 1025 (!) 152/69 -- -- 63 14 100 % -- --  06/08/22 0937 (!) 151/77 98.2 F (36.8 C) Oral 67 17 98 % '5\' 7"'$  (1.702 m) 108.9 kg     Recent laboratory studies:  Recent Labs    06/09/22 0600  WBC 7.9  HGB 10.9*  HCT 31.2*  PLT 132*     Discharge Medications:   Allergies as of 06/09/2022       Reactions   Doxycycline Other (See Comments)   Pancreatic issues   Metronidazole Other (See Comments)   Cream  Burned face   Dilaudid [hydromorphone Hcl] Anxiety         Medication List     STOP taking these medications    ibuprofen 200 MG tablet Commonly known as: ADVIL       TAKE these medications    Allergy (Cetirizine) 10 MG tablet Generic drug: cetirizine Take 10 mg by mouth daily.   aspirin EC 81 MG tablet Take 1 tablet (81 mg total) by mouth 2 (two) times daily. To be taken after surgery to prevent blood clots What changed: Another medication with the same name was removed. Continue taking this medication, and follow the directions you see here.   B-D UF III MINI PEN NEEDLES 31G X 5 MM Misc Generic drug: Insulin Pen Needle Use as directed.   Dexcom G6 Receiver Devi Use to check blood sugars every morning fasting and 2 hours after largest meal   Dexcom G6 Sensor Misc Apply new sensor every 10 days.  Use to check blood sugars every morning fasting and 2 hours after largest meal   Dexcom G6 Transmitter Misc Use to check blood sugars every morning fasting and 2 hours after largest meal   docusate sodium 100 MG capsule Commonly known as: Colace Take 1 capsule (100 mg total) by mouth daily as needed.   famotidine 20 MG tablet Commonly known as: PEPCID TAKE 1 TABLET BY MOUTH TWICE  DAILY   fluticasone 50 MCG/ACT nasal spray Commonly known as: FLONASE PLACE 1 SPRAY INTO BOTH NOSTRILS DAILY AS NEEDED FOR ALLERGIES OR RHINITIS.   folic acid 235 MCG tablet Commonly known as: FOLVITE Take 400 mcg by mouth daily.   gabapentin 600 MG tablet Commonly known as: NEURONTIN TAKE 2 TABLETS BY MOUTH 3 TIMES  DAILY   insulin lispro 100 UNIT/ML KwikPen Commonly known as: HumaLOG KwikPen Inject 20 Units into the skin 3 (three) times daily. INJECT 20 UNITS 3 TIMES A DAY WITH MEALS   Lantus SoloStar 100 UNIT/ML Solostar Pen Generic drug: insulin glargine Inject 70 Units into the skin daily.   losartan 25 MG tablet Commonly known as: COZAAR TAKE 1 TABLET BY MOUTH DAILY   methocarbamol 500 MG tablet Commonly known as: ROBAXIN Take 1  tablet (500 mg total) by mouth 2 (two) times daily as needed.   multivitamin tablet Take 1 tablet by mouth daily.  NIACIN PO Take 1,000 mg by mouth at bedtime. Take 1 hour after the 81 mg aspirin   ondansetron 4 MG tablet Commonly known as: Zofran Take 1 tablet (4 mg total) by mouth every 8 (eight) hours as needed for nausea or vomiting. What changed: Another medication with the same name was removed. Continue taking this medication, and follow the directions you see here.   oxyCODONE-acetaminophen 5-325 MG tablet Commonly known as: Percocet Take 1-2 tablets by mouth every 6 (six) hours as needed. To be taken after surgery   Refresh Tears 0.5 % Soln Generic drug: carboxymethylcellulose Place 1 drop into both eyes 3 (three) times daily as needed (dry eyes).   sulfamethoxazole-trimethoprim 800-160 MG tablet Commonly known as: BACTRIM DS Take 1 tablet by mouth 2 (two) times daily. To be taken after surgery   Vitamin D-3 125 MCG (5000 UT) Tabs Take 1 tablet by mouth daily.               Durable Medical Equipment  (From admission, onward)           Start     Ordered   06/08/22 1557  DME Walker rolling  Once       Question Answer Comment  Walker: With 5 Inch Wheels   Patient needs a walker to treat with the following condition Status post left partial knee replacement      06/08/22 1556   06/08/22 1557  DME 3 n 1  Once        06/08/22 1556   06/08/22 1557  DME Bedside commode  Once       Question:  Patient needs a bedside commode to treat with the following condition  Answer:  Status post left partial knee replacement   06/08/22 1556            Diagnostic Studies: DG Knee Right Port  Result Date: 06/08/2022 CLINICAL DATA:  Postop right knee. EXAM: PORTABLE RIGHT KNEE - 1-2 VIEW COMPARISON:  None Available. FINDINGS: Status post right knee total arthroplasty with intact hardware. Subcutaneous emphysema and small suprapatellar joint effusion, as expected. No  acute fracture. IMPRESSION: Status post right knee arthroplasty with postsurgical changes as expected. Electronically Signed   By: Keane Police D.O.   On: 06/08/2022 15:47    Disposition: Discharge disposition: 01-Home or Self Care          Follow-up Information     Leandrew Koyanagi, MD. Schedule an appointment as soon as possible for a visit in 2 week(s).   Specialty: Orthopedic Surgery Contact information: [address] [place] [address] [phone]                  Signed: Aundra Dubin 06/09/2022, 7:57 AM

## 2022-06-11 ENCOUNTER — Telehealth: Payer: Self-pay

## 2022-06-24 ENCOUNTER — Ambulatory Visit (INDEPENDENT_AMBULATORY_CARE_PROVIDER_SITE_OTHER): Payer: Medicare Other | Admitting: Physician Assistant

## 2022-06-24 ENCOUNTER — Encounter: Payer: Self-pay | Admitting: Orthopaedic Surgery

## 2022-06-24 DIAGNOSIS — Z96651 Presence of right artificial knee joint: Secondary | ICD-10-CM

## 2022-06-24 NOTE — Progress Notes (Signed)
Post-Op Visit Note   Patient: Jasmine Davila           Date of Birth: 10-Jan-1951           MRN: [id number] Visit Date: 06/24/2022 PCP: Lorrene Reid, PA-C   Assessment & Plan:  Chief Complaint:  Chief Complaint  Patient presents with   Right Knee - Follow-up    Right total knee arthroplasty 06/08/2022   Visit Diagnoses:  1. H/O total knee replacement, right     Plan: Patient is a pleasant 71 year old female who comes in today 2 weeks status post right total knee replacement 06/08/2022.  She has been doing well.  She is taking her aspirin twice daily for DVT prophylaxis.  She is getting home health physical therapy and is ambulating with a single-point cane.  Examination of her right knee reveals a well-healed surgical incision with nylon sutures in place.  No evidence of infection or cellulitis.  Calves are soft nontender.  She is neurovascular intact distally.  At this point, sutures were removed and Steri-Strips applied.  Dental prophylaxis reinforced.  I would like to send her to outpatient physical therapy but she tells me that she has no transportation as her husband is having toe amputation tomorrow.  We will continue with her home health physical therapy for another 4 weeks.  If she is able to find transportation to outpatient physical therapy in the meantime she will let me know.  Continue with aspirin twice daily for another 4 weeks.  Follow-up in 4 weeks time for repeat evaluation and 2 view x-rays of the right knee.  Call with concerns or questions.  Follow-Up Instructions: Return in about 4 weeks (around 07/22/2022).   Orders:  No orders of the defined types were placed in this encounter.  No orders of the defined types were placed in this encounter.   Imaging: No new imaging  PMFS History: Patient Active Problem List   Diagnosis Date Noted   Status post total right knee replacement 06/08/2022   Primary osteoarthritis of right knee 06/07/2022   Ductal  carcinoma in situ (DCIS) of right breast 06/14/2020   Acute pancreatitis 06/21/2019   Pancreatitis 06/21/2019   Acute diverticulitis 06/21/2019   Abnormal LFTs 06/21/2019   Hyperbilirubinemia 06/21/2019   Left leg swelling 06/21/2019   Hyponatremia 06/21/2019   Hypochloremia 06/21/2019   Obesity 06/21/2019   Vitamin D insufficiency 04/11/2019   Lumbar spondylosis 02/28/2019   Chronic back pain 02/07/2019   Inflamed acrochordon 02/07/2019   Acrochordon- over R eye interferring with vision 02/07/2019   Acute otitis media 11/23/2018   Acute maxillary sinusitis 11/23/2018   History of malignant lymphoma 06/03/2018   Neuropathy 06/03/2018   Endometrial disorder- trace endometrial canal fluid seen on CT scan recently and in 2016.  No GYN care many yrs, to GYN for TVUS and further w/up prn 04/20/2018   Diabetic peripheral neuropathy associated with type 2 diabetes mellitus ([place]) 04/20/2018   Acute on chronic pancreatitis ([place]) 04/13/2018   Onychomycosis 03/16/2018   Neuropathic pain syndrome (non-herpetic) 03/16/2018   Sleep apnea with CPAP 12/29/2016   History of decreased platelet count 12/29/2016   Morbid obesity ([place]) 11/25/2016   Diabetes mellitus due to underlying condition with diabetic nephropathy, with long-term current use of insulin ([place]) 11/25/2016   Smoking hx 11/24/2016   Hypertension associated with diabetes ([place]) 11/24/2016   Chronic pancreatitis ([place]) 11/24/2016   H/o Cancer:   B- cell lymphoma (spleen and lung) - non-Hodgkin's 11/24/2016  Diabetic neuropathy - exaccerbated by chemo 11/24/2016   GERD (gastroesophageal reflux disease) 11/24/2016   Hyperlipidemia associated with type 2 diabetes mellitus ([place]) 11/24/2016   B12 deficiency 11/24/2016   Vitamin B6 deficiency (non anemic) 11/24/2016   Past Medical History:  Diagnosis Date   Abnormal EKG    hx of right bundle branch block   Acute pancreatitis    HX OF   Anemia yrs ago   Breast cancer ([place]) 2021    Cancer ([place]) dx 2016   B cell lymphoma- Non Hodgkins in remission   Diabetes mellitus ([facility])    TYPE 2   Diabetic neuropathy ([facility])    Diabetic neuropathy (H[facility])    Endometrial disorder    GERD (gastroesophageal reflux disease)    Headache    tension or sinus   Hypertension    Malignant lymphoma ([facility])    Morbid obesity ([place])    Peripheral neuropathy    BOTH FEET, SLIGHT NEUROPATHY IN HANDS   Sleep apnea    USES CPAP at times   Vitamin B 12 deficiency     Family History  Problem Relation Age of Onset   Cancer Mother 70       colon   Hypertension Mother    Osteoporosis Mother    Heart attack Father    Heart disease Sister    Alcohol abuse Maternal Grandfather     Past Surgical History:  Procedure Laterality Date   BREAST LUMPECTOMY Right 2021   CHOLECYSTECTOMY  03/06/1996   colomscopy     DILATATION & CURETTAGE/HYSTEROSCOPY WITH MYOSURE N/A 07/04/2018   Procedure: DILATATION & CURETTAGE/HYSTEROSCOPY WITH MYOSURE;  Surgeon: Salvadore Dom, MD;  Location: [facility];  Service: Gynecology;  Laterality: N/A;   MEDIPORT INSERTION, DOUBLE     MEDIPORT REMOVAL     TOTAL KNEE ARTHROPLASTY Right 06/08/2022   Procedure: RIGHT TOTAL KNEE REPLACEMENT;  Surgeon: Leandrew Koyanagi, MD;  Location: [facility];  Service: Orthopedics;  Laterality: Right;   Social History   Occupational History   Occupation: retired from medical billing  Tobacco Use   Smoking status: Former    Packs/day: 0.50    Years: 4.00    Total pack years: 2.00    Types: Cigarettes    Quit date: 12/21/1968    Years since quitting: 53.5   Smokeless tobacco: Never   Tobacco comments:    as teenager  Vaping Use   Vaping Use: Never used  Substance and Sexual Activity   Alcohol use: No   Drug use: No   Sexual activity: Not Currently    Birth control/protection: None

## 2022-06-24 NOTE — Addendum Note (Signed)
Addended by: Lendon Collar on: 06/24/2022 11:19 AM   Modules accepted: Orders

## 2022-06-29 ENCOUNTER — Encounter: Payer: Self-pay | Admitting: Orthopaedic Surgery

## 2022-07-16 ENCOUNTER — Other Ambulatory Visit: Payer: Self-pay

## 2022-07-16 ENCOUNTER — Emergency Department: Payer: Medicare Other

## 2022-07-16 ENCOUNTER — Emergency Department
Admission: EM | Admit: 2022-07-16 | Discharge: 2022-07-16 | Disposition: A | Discharge status: Home / Self Care | Payer: Medicare Other | Attending: Emergency Medicine | Admitting: Emergency Medicine

## 2022-07-16 DIAGNOSIS — K5792 Diverticulitis of intestine, part unspecified, without perforation or abscess without bleeding: Secondary | ICD-10-CM | POA: Insufficient documentation

## 2022-07-16 DIAGNOSIS — R1032 Left lower quadrant pain: Secondary | ICD-10-CM | POA: Diagnosis present

## 2022-07-16 DIAGNOSIS — Z853 Personal history of malignant neoplasm of breast: Secondary | ICD-10-CM | POA: Diagnosis not present

## 2022-07-16 DIAGNOSIS — I700 Atherosclerosis of aorta: Secondary | ICD-10-CM | POA: Diagnosis not present

## 2022-07-16 LAB — CBC
HCT: 36 % (ref 36.0–46.0)
Hemoglobin: 11.9 g/dL — ABNORMAL LOW (ref 12.0–15.0)
MCH: 28.8 pg (ref 26.0–34.0)
MCHC: 33.1 g/dL (ref 30.0–36.0)
MCV: 87.2 fL (ref 80.0–100.0)
Platelets: 177 10*3/uL (ref 150–400)
RBC: 4.13 MIL/uL (ref 3.87–5.11)
RDW: 13.4 % (ref 11.5–15.5)
WBC: 7.2 10*3/uL (ref 4.0–10.5)
nRBC: 0 % (ref 0.0–0.2)

## 2022-07-16 LAB — LIPASE, BLOOD: Lipase: 38 U/L (ref 11–51)

## 2022-07-16 LAB — COMPREHENSIVE METABOLIC PANEL
ALT: 25 U/L (ref 0–44)
AST: 27 U/L (ref 15–41)
Albumin: 4 g/dL (ref 3.5–5.0)
Alkaline Phosphatase: 69 U/L (ref 38–126)
Anion gap: 9 (ref 5–15)
BUN: 9 mg/dL (ref 8–23)
CO2: 27 mmol/L (ref 22–32)
Calcium: 9.9 mg/dL (ref 8.9–10.3)
Chloride: 101 mmol/L (ref 98–111)
Creatinine, Ser: 0.66 mg/dL (ref 0.44–1.00)
GFR, Estimated: >60 mL/min (ref >60)
Glucose, Bld: 117 mg/dL — ABNORMAL HIGH (ref 70–99)
Potassium: 4.2 mmol/L (ref 3.5–5.1)
Sodium: 137 mmol/L (ref 135–145)
Total Bilirubin: 0.7 mg/dL (ref 0.3–1.2)
Total Protein: 7.6 g/dL (ref 6.5–8.1)

## 2022-07-16 LAB — LACTIC ACID, PLASMA: Lactic Acid, Venous: 0.8 mmol/L (ref 0.5–1.9)

## 2022-07-16 MED ORDER — AMOXICILLIN-POT CLAVULANATE 500-125 MG PO TABS: 1.0000 | ORAL_TABLET | Freq: Three times a day (TID) | ORAL | 0 refills | Status: AC

## 2022-07-16 MED ORDER — SODIUM CHLORIDE 0.9 % IV BOLUS
1000.0000 mL | Freq: Once | INTRAVENOUS | Status: AC
  Administered 2022-07-16: 1000 mL via INTRAVENOUS

## 2022-07-16 MED ORDER — MORPHINE SULFATE (PF) 2 MG/ML IV SOLN
2.0000 mg | Freq: Once | INTRAVENOUS | Status: AC
  Administered 2022-07-16: 2 mg via INTRAVENOUS
  Filled 2022-07-16: qty 1

## 2022-07-16 MED ORDER — ONDANSETRON 4 MG PO TBDP: 4.0000 mg | ORAL_TABLET | Freq: Three times a day (TID) | ORAL | 0 refills | Status: DC | PRN

## 2022-07-16 MED ORDER — OXYCODONE-ACETAMINOPHEN 5-325 MG PO TABS: 1.0000 | ORAL_TABLET | ORAL | 0 refills | Status: DC | PRN

## 2022-07-16 MED ORDER — KETOROLAC TROMETHAMINE 30 MG/ML IJ SOLN
15.0000 mg | Freq: Once | INTRAMUSCULAR | Status: AC
Start: 2022-07-16 — End: 2022-07-16
  Administered 2022-07-16: 15 mg via INTRAVENOUS
  Filled 2022-07-16: qty 1

## 2022-07-16 MED ORDER — IOHEXOL 300 MG/ML  SOLN
100.0000 mL | Freq: Once | INTRAMUSCULAR | Status: AC | PRN
Start: 2022-07-16 — End: 2022-07-16
  Administered 2022-07-16: 100 mL via INTRAVENOUS

## 2022-07-16 NOTE — ED Triage Notes (Signed)
Pt. To ED pov for LLQ abdominal pain x5-6 days. Denies n/v/d, bowel or bladder symptoms. Pt. States poor appetite. Hx. Of pancreatitis, but pt. States this pain is not the same. C/o chills, denies fever. No pain meds today.

## 2022-07-16 NOTE — ED Provider Notes (Signed)
[facility] Provider Note   Event Date/Time   First MD Initiated Contact with Patient 07/16/22 1140     (approximate) History  Abdominal Pain (Pt. To ED pov for LLQ abdominal pain x5-6 days. Denies n/v/d, bowel or bladder symptoms. Pt. States poor appetite. Hx. Of pancreatitis, but pt. States this pain is not the same. C/o chills, denies fever. No pain meds today.)  HPI Jasmine Davila is a 71 y.o. female with a stated past medical history of diverticulosis who presents for left lower quadrant abdominal pain over the last 5 days.  Patient states that this pain is worsened postprandially.  Patient states that this pain is different from when she is diagnosed with pancreatitis in the past however she does state that the last time she had pancreatitis she also had diverticulitis.  Patient denies any other exacerbating or relieving factors for this pain.  Patient denies bloody stools ROS: Patient currently denies any vision changes, tinnitus, difficulty speaking, facial droop, sore throat, chest pain, shortness of breath, abdominal pain, nausea/vomiting/diarrhea, dysuria, or weakness/numbness/paresthesias in any extremity   Physical Exam  Triage Vital Signs: ED Triage Vitals  Enc Vitals Group     BP 07/16/22 1115 127/65     Pulse Rate 07/16/22 1115 77     Resp 07/16/22 1115 18     Temp 07/16/22 1115 99.2 F (37.3 C)     Temp Source 07/16/22 1115 Oral     SpO2 07/16/22 1115 100 %     Weight 07/16/22 1118 234 lb (106.1 kg)     Height 07/16/22 1118 '5\' 7"'$  (1.702 m)     Head Circumference --      Peak Flow --      Pain Score 07/16/22 1118 6     Pain Loc --      Pain Edu? --      Excl. in [place]? --    Most recent vital signs: Vitals:   07/16/22 1330 07/16/22 1539  BP: (!) 154/70 (!) 150/68  Pulse: 67 70  Resp: 16 18  Temp:  98.5 F (36.9 C)  SpO2: 98% 99%   General: Awake, oriented x4. CV:  Good peripheral perfusion.  Resp:  Normal effort.  Abd:  No  distention.  Other:  Elderly overweight Caucasian female laying in bed in no acute distress ED Results / Procedures / Treatments  Labs (all labs ordered are listed, but only abnormal results are displayed) Labs Reviewed  COMPREHENSIVE METABOLIC PANEL - Abnormal; Notable for the following components:      Result Value   Glucose, Bld 117 (*)    All other components within normal limits  CBC - Abnormal; Notable for the following components:   Hemoglobin 11.9 (*)    All other components within normal limits  LIPASE, BLOOD  LACTIC ACID, PLASMA   EKG ED ECG REPORT I, Naaman Plummer, the attending physician, personally viewed and interpreted this ECG. Date: 07/16/2022 EKG Time: 1134 Rate: 71 Rhythm: normal sinus rhythm QRS Axis: normal Intervals: Bifascicular block ST/T Wave abnormalities: normal Narrative Interpretation: Normal sinus rhythm with bifascicular block that is unchanged from previous.  No evidence of acute ischemia RADIOLOGY ED MD interpretation: CT of the abdomen and pelvis shows mild to moderate acute diverticulitis of the proximal sigmoid colon without evidence of extraluminal gas or perforation -Agree with radiology assessment Official radiology report(s): CT Abdomen Pelvis W Contrast  Result Date: 07/16/2022 CLINICAL DATA:  Right breast cancer. Additional history of large  B-lymphoma. Left lower quadrant abdominal pain. History of diverticulitis. * Tracking Code: BO * EXAM: CT ABDOMEN AND PELVIS WITH CONTRAST TECHNIQUE: Multidetector CT imaging of the abdomen and pelvis was performed using the standard protocol following bolus administration of intravenous contrast. RADIATION DOSE REDUCTION: This exam was performed according to the departmental dose-optimization program which includes automated exposure control, adjustment of the mA and/or kV according to patient size and/or use of iterative reconstruction technique. CONTRAST:  171m OMNIPAQUE IOHEXOL 300 MG/ML  SOLN  COMPARISON:  08/05/2021 FINDINGS: Lower chest: Stable left lower lobe scarring. Mild cardiomegaly. Suspected small type 1 hiatal hernia. Hepatobiliary: Prior cholecystectomy. Intrahepatic and extrahepatic biliary dilatation, common bile duct about 1.3 cm in diameter on image 30 of series 3 which is stable. No well-defined ampullary lesion. Overall the degree of biliary dilatation is stable. Subtle subcapsular hypodensity anteriorly in the right hepatic lobe on image 30 of series 3, probably from some mild focal steatosis. Pancreas: Mild dorsal pancreatic duct dilatation, similar to previous. Spleen: Stable subtle linear hypodensity anteriorly in the spleen on image 18 series 3. Spleen measures 13.0 by 5.6 by 10.3 cm (volume = 390 cm^3), within normal size limits. Adrenals/Urinary Tract: Unremarkable Stomach/Bowel: Sigmoid colon diverticulosis with mild to moderate acute diverticulitis shown on image 42 series 6. No extraluminal gas or abscess. Adjacent mesenteric inflammatory stranding. Trace free pelvic fluid. Vascular/Lymphatic: Atherosclerosis is present, including aortoiliac atherosclerotic disease. No pathologic adenopathy identified. Reproductive: Unremarkable Other: No supplemental non-categorized findings. Musculoskeletal: Lower thoracic and lumbar spondylosis and degenerative disc disease, resulting in prominent bilateral foraminal impingement at L5-S1 where there is intervertebral and facet spurring. IMPRESSION: 1. Mild to moderate acute diverticulitis of the proximal sigmoid colon, with inflamed diverticulum and surrounding mesenteric edema but no extraluminal gas or abscess. Trace free pelvic fluid. 2. Chronic intrahepatic and extrahepatic biliary dilatation, no change from prior exam. 3. Subtle subcapsular hypodensity anteriorly in the right hepatic lobe, given the indistinct nature of this is probably from mild focal steatosis. 4. Other imaging findings of potential clinical significance: Aortic  Atherosclerosis (ICD10-I70.0). Prominent bilateral foraminal impingement at L5-S1 due to spurring. Mild cardiomegaly. Small type 1 hiatal hernia. Stable left lower lobe scarring. Electronically Signed   By: WVan ClinesM.D.   On: 07/16/2022 14:13   PROCEDURES: Critical Care performed: No .1-3 Lead EKG Interpretation  Performed by: BNaaman Plummer MD Authorized by: BNaaman Plummer MD     Interpretation: normal     ECG rate:  71   ECG rate assessment: normal     Rhythm: sinus rhythm     Ectopy: none     Conduction: normal    MEDICATIONS ORDERED IN ED: Medications  sodium chloride 0.9 % bolus 1,000 mL (0 mLs Intravenous Stopped 07/16/22 1506)  ketorolac (TORADOL) 30 MG/ML injection 15 mg (15 mg Intravenous Given 07/16/22 1245)  morphine (PF) 2 MG/ML injection 2 mg (2 mg Intravenous Given 07/16/22 1245)  iohexol (OMNIPAQUE) 300 MG/ML solution 100 mL (100 mLs Intravenous Contrast Given 07/16/22 1353)   IMPRESSION / MDM / ASSESSMENT AND PLAN / ED COURSE  I reviewed the triage vital signs and the nursing notes.                             The patient is on the cardiac monitor to evaluate for evidence of arrhythmia and/or significant heart rate changes. Patient's presentation is most consistent with acute presentation with potential threat to life or bodily  function. Patient has diverticulitis that is amenable to oral antibiotics. Patient has no peritoneal signs or signs of perforation. Patients symptoms not typical for other emergent causes of abdominal pain such as, but not limited to, appendicitis, abdominal aortic aneurysm, surgical biliary disease, acute coronary syndrome, etc.  Patient will be discharged with strict return precautions and follow up with primary MD within 12-24 hours for further evaluation.  Patient understands that they may require admission and IV antibiotics and possibly surgery if they do not improve with oral antibiotics.   FINAL CLINICAL IMPRESSION(S) / ED  DIAGNOSES   Final diagnoses:  Acute diverticulitis   Rx / DC Orders   ED Discharge Orders          Ordered    amoxicillin-clavulanate (AUGMENTIN) 500-125 MG tablet  3 times daily        07/16/22 1624    ondansetron (ZOFRAN-ODT) 4 MG disintegrating tablet  Every 8 hours PRN        07/16/22 1624    oxyCODONE-acetaminophen (PERCOCET) 5-325 MG tablet  Every 4 hours PRN        07/16/22 1624           Note:  This document was prepared using Dragon voice recognition software and may include unintentional dictation errors.   Naaman Plummer, MD 07/17/22 [phone]

## 2022-07-16 NOTE — Discharge Instructions (Addendum)
Be sure to take a probiotic with the antibiotics to help reduce diarrhea.   You have been seen in the emergency department for emergency care. It is important that you contact your own doctor, specialist or the closest clinic for follow-up care. Please bring this instruction sheet, all medications and X-ray copies with you when you are seen for follow-up care.  Determining the exact cause for all patients with abdominal pain is extremely difficult in the emergency department. Our primary focus is to rule-out immediate life-threatening diseases. If no immediate source of pain is found the definitive diagnosis frequently needs to be determined over time.Many times your primary care physician can determine the cause by following the symptoms over time. Sometimes, specialist are required such as Gastroenterologists, Gynecologists, Urologists or Surgeons. Please return immediately to the Emergency Department for fever>101, Vomiting or Intractable Pain. You should return to the emergency department or see your primary care provider in 12-24hrs if your pain is no better and sooner if your pain becomes worse.

## 2022-07-21 ENCOUNTER — Encounter: Payer: Self-pay | Admitting: Physician Assistant

## 2022-07-21 ENCOUNTER — Ambulatory Visit (INDEPENDENT_AMBULATORY_CARE_PROVIDER_SITE_OTHER): Payer: Medicare Other | Admitting: Physician Assistant

## 2022-07-21 VITALS — BP 108/68 | HR 80 | Temp 97.7°F | Ht 67.0 in | Wt 234.0 lb

## 2022-07-21 DIAGNOSIS — I152 Hypertension secondary to endocrine disorders: Secondary | ICD-10-CM

## 2022-07-21 DIAGNOSIS — Z96651 Presence of right artificial knee joint: Secondary | ICD-10-CM | POA: Diagnosis not present

## 2022-07-21 DIAGNOSIS — E1159 Type 2 diabetes mellitus with other circulatory complications: Secondary | ICD-10-CM

## 2022-07-21 DIAGNOSIS — E1142 Type 2 diabetes mellitus with diabetic polyneuropathy: Secondary | ICD-10-CM

## 2022-07-21 DIAGNOSIS — E0821 Diabetes mellitus due to underlying condition with diabetic nephropathy: Secondary | ICD-10-CM

## 2022-07-21 DIAGNOSIS — Z794 Long term (current) use of insulin: Secondary | ICD-10-CM

## 2022-07-21 NOTE — Assessment & Plan Note (Signed)
>>  ASSESSMENT AND PLAN FOR DIABETIC NEUROPATHY - EXACCERBATED BY CHEMO WRITTEN ON 07/21/2022  1:56 PM BY ABONZA, MARITZA, PA-C  -Not well controlled. Patient has failed multiple treatments (Elavil, Cymbalta). Will continue Gabapentin (see med list) and in the future we can trial changing to Lyrica if affordable. Will continue to monitor.

## 2022-07-21 NOTE — Assessment & Plan Note (Signed)
-  Not well controlled. Patient has failed multiple treatments (Elavil, Cymbalta). Will continue Gabapentin (see med list) and in the future we can trial changing to Lyrica if affordable. Will continue to monitor.

## 2022-07-21 NOTE — Patient Instructions (Signed)
Diabetes Mellitus and Foot Care Foot care is an important part of your health, especially when you have diabetes. Diabetes may cause you to have problems because of poor blood flow (circulation) to your feet and legs, which can cause your skin to: Become thinner and drier. Break more easily. Heal more slowly. Peel and crack. You may also have nerve damage (neuropathy) in your legs and feet, causing decreased feeling in them. This means that you may not notice minor injuries to your feet that could lead to more serious problems. Noticing and addressing any potential problems early is the best way to prevent future foot problems. How to care for your feet Foot hygiene  Wash your feet daily with warm water and mild soap. Do not use hot water. Then, pat your feet and the areas between your toes until they are completely dry. Do not soak your feet as this can dry your skin. Trim your toenails straight across. Do not dig under them or around the cuticle. File the edges of your nails with an emery board or nail file. Apply a moisturizing lotion or petroleum jelly to the skin on your feet and to dry, brittle toenails. Use lotion that does not contain alcohol and is unscented. Do not apply lotion between your toes. Shoes and socks Wear clean socks or stockings every day. Make sure they are not too tight. Do not wear knee-high stockings since they may decrease blood flow to your legs. Wear shoes that fit properly and have enough cushioning. Always look in your shoes before you put them on to be sure there are no objects inside. To break in new shoes, wear them for just a few hours a day. This prevents injuries on your feet. Wounds, scrapes, corns, and calluses  Check your feet daily for blisters, cuts, bruises, sores, and redness. If you cannot see the bottom of your feet, use a mirror or ask someone for help. Do not cut corns or calluses or try to remove them with medicine. If you find a minor scrape,  cut, or break in the skin on your feet, keep it and the skin around it clean and dry. You may clean these areas with mild soap and water. Do not clean the area with peroxide, alcohol, or iodine. If you have a wound, scrape, corn, or callus on your foot, look at it several times a day to make sure it is healing and not infected. Check for: Redness, swelling, or pain. Fluid or blood. Warmth. Pus or a bad smell. General tips Do not cross your legs. This may decrease blood flow to your feet. Do not use heating pads or hot water bottles on your feet. They may burn your skin. If you have lost feeling in your feet or legs, you may not know this is happening until it is too late. Protect your feet from hot and cold by wearing shoes, such as at the beach or on hot pavement. Schedule a complete foot exam at least once a year (annually) or more often if you have foot problems. Report any cuts, sores, or bruises to your health care provider immediately. Where to find more information American Diabetes Association: www.diabetes.org Association of Diabetes Care & Education Specialists: www.diabeteseducator.org Contact a health care provider if: You have a medical condition that increases your risk of infection and you have any cuts, sores, or bruises on your feet. You have an injury that is not healing. You have redness on your legs or feet. You   feel burning or tingling in your legs or feet. You have pain or cramps in your legs and feet. Your legs or feet are numb. Your feet always feel cold. You have pain around any toenails. Get help right away if: You have a wound, scrape, corn, or callus on your foot and: You have pain, swelling, or redness that gets worse. You have fluid or blood coming from the wound, scrape, corn, or callus. Your wound, scrape, corn, or callus feels warm to the touch. You have pus or a bad smell coming from the wound, scrape, corn, or callus. You have a fever. You have a red  line going up your leg. Summary Check your feet every day for blisters, cuts, bruises, sores, and redness. Apply a moisturizing lotion or petroleum jelly to the skin on your feet and to dry, brittle toenails. Wear shoes that fit properly and have enough cushioning. If you have foot problems, report any cuts, sores, or bruises to your health care provider immediately. Schedule a complete foot exam at least once a year (annually) or more often if you have foot problems. This information is not intended to replace advice given to you by your health care provider. Make sure you discuss any questions you have with your health care provider. Document Revised: 06/27/2020 Document Reviewed: 06/27/2020 Elsevier Patient Education  2023 Elsevier Inc.  

## 2022-07-21 NOTE — Assessment & Plan Note (Signed)
-  BP in office at goal <140/90. Continue current medication regimen. Will continue to monitor.

## 2022-07-21 NOTE — Progress Notes (Signed)
Established patient visit   Patient: Jasmine Davila   DOB: 1951-04-09   71 y.o. Female  MRN: [id number] Visit Date: 07/21/2022  Chief Complaint  Patient presents with   Follow-up   Subjective    HPI  Patient presents for chronic follow-up. Patient is s/p total right knee replacement and currently getting over acute diverticulitis.   Diabetes: No increased urination or thirst. Pt reports medication compliance. No hypoglycemic events. Checking glucose at home with dexcom. Reports lowest sugar has been 66 and highest 230. The couple of months have been stressful.   HTN: Pt denies chest pain, palpitations, dizziness or syncope. Taking medication as directed without side effects.   Neuropathy: Reports still has significant neuropathy and symptoms are worse at night-time. Gabapentin does not always helps. The pain medication has seemed to help some.   Medications: Outpatient Medications Prior to Visit  Medication Sig   amoxicillin-clavulanate (AUGMENTIN) 500-125 MG tablet Take 1 tablet (500 mg total) by mouth 3 (three) times daily for 7 days.   aspirin EC 81 MG tablet Take 1 tablet (81 mg total) by mouth 2 (two) times daily. To be taken after surgery to prevent blood clots   carboxymethylcellulose (REFRESH TEARS) 0.5 % SOLN Place 1 drop into both eyes 3 (three) times daily as needed (dry eyes).   cetirizine (ALLERGY, CETIRIZINE,) 10 MG tablet Take 10 mg by mouth daily.   Cholecalciferol (VITAMIN D-3) 5000 units TABS Take 1 tablet by mouth daily.   Continuous Blood Gluc Receiver (DEXCOM G6 RECEIVER) DEVI Use to check blood sugars every morning fasting and 2 hours after largest meal   Continuous Blood Gluc Sensor (DEXCOM G6 SENSOR) MISC Apply new sensor every 10 days.  Use to check blood sugars every morning fasting and 2 hours after largest meal   Continuous Blood Gluc Transmit (DEXCOM G6 TRANSMITTER) MISC Use to check blood sugars every morning fasting and 2 hours after largest  meal   docusate sodium (COLACE) 100 MG capsule Take 1 capsule (100 mg total) by mouth daily as needed.   famotidine (PEPCID) 20 MG tablet TAKE 1 TABLET BY MOUTH TWICE  DAILY   fluticasone (FLONASE) 50 MCG/ACT nasal spray PLACE 1 SPRAY INTO BOTH NOSTRILS DAILY AS NEEDED FOR ALLERGIES OR RHINITIS.   folic acid (FOLVITE) 831 MCG tablet Take 400 mcg by mouth daily.   gabapentin (NEURONTIN) 600 MG tablet TAKE 2 TABLETS BY MOUTH 3 TIMES  DAILY   insulin glargine (LANTUS SOLOSTAR) 100 UNIT/ML Solostar Pen Inject 70 Units into the skin daily.   insulin lispro (HUMALOG KWIKPEN) 100 UNIT/ML KwikPen Inject 20 Units into the skin 3 (three) times daily. INJECT 20 UNITS 3 TIMES A DAY WITH MEALS   Insulin Pen Needle (B-D UF III MINI PEN NEEDLES) 31G X 5 MM MISC Use as directed.   losartan (COZAAR) 25 MG tablet TAKE 1 TABLET BY MOUTH DAILY   methocarbamol (ROBAXIN) 500 MG tablet Take 1 tablet (500 mg total) by mouth 2 (two) times daily as needed.   Multiple Vitamin (MULTIVITAMIN) tablet Take 1 tablet by mouth daily.   NIACIN PO Take 1,000 mg by mouth at bedtime. Take 1 hour after the 81 mg aspirin   ondansetron (ZOFRAN-ODT) 4 MG disintegrating tablet Take 1 tablet (4 mg total) by mouth every 8 (eight) hours as needed for nausea or vomiting.   oxyCODONE-acetaminophen (PERCOCET) 5-325 MG tablet Take 1 tablet by mouth every 4 (four) hours as needed for severe pain.   sulfamethoxazole-trimethoprim (BACTRIM DS) 800-160  MG tablet Take 1 tablet by mouth 2 (two) times daily. To be taken after surgery   No facility-administered medications prior to visit.    Review of Systems Review of Systems:  A fourteen system review of systems was performed and found to be positive as per HPI.  Last CBC Lab Results  Component Value Date   WBC 7.2 07/16/2022   HGB 11.9 (L) 07/16/2022   HCT 36.0 07/16/2022   MCV 87.2 07/16/2022   MCH 28.8 07/16/2022   RDW 13.4 07/16/2022   PLT 177 26/94/8546   Last metabolic panel Lab  Results  Component Value Date   GLUCOSE 117 (H) 07/16/2022   NA 137 07/16/2022   K 4.2 07/16/2022   CL 101 07/16/2022   CO2 27 07/16/2022   BUN 9 07/16/2022   CREATININE 0.66 07/16/2022   GFRNONAA >60 07/16/2022   CALCIUM 9.9 07/16/2022   PHOS 2.1 (L) 06/22/2019   PROT 7.6 07/16/2022   ALBUMIN 4.0 07/16/2022   LABGLOB 2.8 03/20/2022   AGRATIO 1.6 03/20/2022   BILITOT 0.7 07/16/2022   ALKPHOS 69 07/16/2022   AST 27 07/16/2022   ALT 25 07/16/2022   ANIONGAP 9 07/16/2022   Last lipids Lab Results  Component Value Date   CHOL 155 03/20/2022   HDL 68 03/20/2022   LDLCALC 74 03/20/2022   TRIG 68 03/20/2022   CHOLHDL 2.3 03/20/2022   Last hemoglobin A1c Lab Results  Component Value Date   HGBA1C 6.1 (H) 05/28/2022   Last thyroid functions Lab Results  Component Value Date   TSH 1.510 01/01/2021   Last vitamin D Lab Results  Component Value Date   VD25OH 70.7 10/07/2021     Objective    BP 108/68   Pulse 80   Temp 97.7 F (36.5 C)   Ht '5\' 7"'$  (1.702 m)   Wt 234 lb (106.1 kg)   LMP 12/21/1998 (Approximate)   SpO2 96%   BMI 36.65 kg/m  BP Readings from Last 3 Encounters:  07/21/22 108/68  07/16/22 (!) 150/68  06/09/22 (!) 144/62   Wt Readings from Last 3 Encounters:  07/21/22 234 lb (106.1 kg)  07/16/22 234 lb (106.1 kg)  06/08/22 240 lb (108.9 kg)    Physical Exam  General:  Pleasant and cooperative, appropriate for stated age.  Neuro:  Alert and oriented,  extra-ocular muscles intact  HEENT:  Normocephalic, atraumatic, neck supple  Skin:  no gross rash, warm, pink. Cardiac:  RRR, S1 S2 Respiratory: CTA B/L  MSK: well healing incision of right knee, some swelling of right knee noted Vascular:  Ext warm, no cyanosis apprec.; cap RF less 2 sec. Psych:  No HI/SI, judgement and insight good, Euthymic mood. Full Affect.   No results found for any visits on 07/21/22.  Assessment & Plan      Problem List Items Addressed This Visit        Cardiovascular and Mediastinum   Hypertension associated with diabetes ([place]) (Chronic)    -BP in office at goal <140/90. Continue current medication regimen. Will continue to monitor.        Endocrine   Diabetic neuropathy - exaccerbated by chemo (Chronic)    -Not well controlled. Patient has failed multiple treatments (Elavil, Cymbalta). Will continue Gabapentin (see med list) and in the future we can trial changing to Lyrica if affordable. Will continue to monitor.      Diabetes mellitus due to underlying condition with diabetic nephropathy, with long-term current use of insulin ([place]) -  Primary (Chronic)    -Last A1c 6.1, remains at goal of less than 7.0. Will continue current medication regimen. Recommend to continue continuous glucose monitoring including for hypoglycemic events. Will continue to monitor.      Other Visit Diagnoses     History of total right knee replacement          History of total right knee replacement: -Follow up with orthopedic surgeon as scheduled tomorrow. -Recommend to continue with HEP and ice therapy as needed.  Return in about 3 months (around 10/21/2022) for DM, HTN, neuropathy, HLD .        Lorrene Reid, PA-C  [facility] [phone] (phone) [phone] (fax)  [place]

## 2022-07-21 NOTE — Assessment & Plan Note (Signed)
-  Last A1c 6.1, remains at goal of less than 7.0. Will continue current medication regimen. Recommend to continue continuous glucose monitoring including for hypoglycemic events. Will continue to monitor.

## 2022-07-22 ENCOUNTER — Ambulatory Visit (INDEPENDENT_AMBULATORY_CARE_PROVIDER_SITE_OTHER): Payer: Medicare Other | Admitting: Orthopaedic Surgery

## 2022-07-22 ENCOUNTER — Encounter: Payer: Self-pay | Admitting: Orthopaedic Surgery

## 2022-07-22 ENCOUNTER — Ambulatory Visit (INDEPENDENT_AMBULATORY_CARE_PROVIDER_SITE_OTHER): Payer: Medicare Other

## 2022-07-22 DIAGNOSIS — Z96651 Presence of right artificial knee joint: Secondary | ICD-10-CM

## 2022-07-22 NOTE — Progress Notes (Signed)
Post-Op Visit Note   Patient: Jasmine Davila           Date of Birth: [birth year]           MRN: [id number] Visit Date: 07/22/2022 PCP: Lorrene Reid, PA-C   Assessment & Plan:  Chief Complaint:  Chief Complaint  Patient presents with   Right Knee - Follow-up    Right total knee arthroplasty 06/08/2022   Visit Diagnoses:  1. H/O total knee replacement, right     Plan: Jasmine Davila is 6 weeks status post right total knee replacement on 06/08/2022.  Developed some lateral knee pain in the last few days.  Just recently finished home PT.  No complaints in general.  Emanation of the right knee shows a fully healed surgical scar.  Range of motion is 5 to 100 degrees.  Stable to varus valgus.  Expected postoperative swelling.  X-rays show stable implant without any complications.  Jasmine Davila is recovering from the knee replacement appropriately.  Dental prophylaxis reinforced.  May discontinue DVT prophylaxis.  Increase activity as tolerated.  I think her recent increase in lateral pain is due to increased activity.  She will back off on activity as much as she needed.  Recheck in 6 weeks.  Follow-Up Instructions: Return in about 6 weeks (around 09/02/2022).   Orders:  Orders Placed This Encounter  Procedures   XR Knee 1-2 Views Right   No orders of the defined types were placed in this encounter.   Imaging: XR Knee 1-2 Views Right  Result Date: 07/22/2022 Stable total knee replacement in good alignment    PMFS History: Patient Active Problem List   Diagnosis Date Noted   Status post total right knee replacement 06/08/2022   Primary osteoarthritis of right knee 06/07/2022   Ductal carcinoma in situ (DCIS) of right breast 06/14/2020   Acute pancreatitis 06/21/2019   Pancreatitis 06/21/2019   Acute diverticulitis 06/21/2019   Abnormal LFTs 06/21/2019   Hyperbilirubinemia 06/21/2019   Left leg swelling 06/21/2019   Hyponatremia 06/21/2019   Hypochloremia 06/21/2019    Obesity 06/21/2019   Vitamin D insufficiency 04/11/2019   Lumbar spondylosis 02/28/2019   Chronic back pain 02/07/2019   Inflamed acrochordon 02/07/2019   Acrochordon- over R eye interferring with vision 02/07/2019   Acute otitis media 11/23/2018   Acute maxillary sinusitis 11/23/2018   History of malignant lymphoma 06/03/2018   Neuropathy 06/03/2018   Endometrial disorder- trace endometrial canal fluid seen on CT scan recently and in 2016.  No GYN care many yrs, to GYN for TVUS and further w/up prn 04/20/2018   Diabetic peripheral neuropathy associated with type 2 diabetes mellitus ([place]) 04/20/2018   Acute on chronic pancreatitis ([place]) 04/13/2018   Onychomycosis 03/16/2018   Neuropathic pain syndrome (non-herpetic) 03/16/2018   Sleep apnea with CPAP 12/29/2016   History of decreased platelet count 12/29/2016   Morbid obesity ([place]) 11/25/2016   Diabetes mellitus due to underlying condition with diabetic nephropathy, with long-term current use of insulin ([place]) 11/25/2016   Smoking hx 11/24/2016   Hypertension associated with diabetes ([place]) 11/24/2016   Chronic pancreatitis ([place]) 11/24/2016   H/o Cancer:   B- cell lymphoma (spleen and lung) - non-Hodgkin's 11/24/2016   Diabetic neuropathy - exaccerbated by chemo 11/24/2016   GERD (gastroesophageal reflux disease) 11/24/2016   Hyperlipidemia associated with type 2 diabetes mellitus ([place]) 11/24/2016   B12 deficiency 11/24/2016   Vitamin B6 deficiency (non anemic) 11/24/2016   Past Medical History:  Diagnosis Date   Abnormal  EKG    hx of right bundle branch block   Acute pancreatitis    HX OF   Anemia yrs ago   Breast cancer ([place]) 2021   Cancer ([place]) dx 2016   B cell lymphoma- Non Hodgkins in remission   Diabetes mellitus (HCC)    TYPE 2   Diabetic neuropathy (HCC)    Diabetic neuropathy (HCC)    Endometrial disorder    GERD (gastroesophageal reflux disease)    Headache    tension or sinus   Hypertension    Malignant  lymphoma (HCC)    Morbid obesity (HCC)    Peripheral neuropathy    BOTH FEET, SLIGHT NEUROPATHY IN HANDS   Sleep apnea    USES CPAP at times   Vitamin B 12 deficiency     Family History  Problem Relation Age of Onset   Cancer Mother 4       colon   Hypertension Mother    Osteoporosis Mother    Heart attack Father    Heart disease Sister    Alcohol abuse Maternal Grandfather     Past Surgical History:  Procedure Laterality Date   BREAST LUMPECTOMY Right 2021   CHOLECYSTECTOMY  03/06/1996   colomscopy     DILATATION & CURETTAGE/HYSTEROSCOPY WITH MYOSURE N/A 07/04/2018   Procedure: DILATATION & CURETTAGE/HYSTEROSCOPY WITH MYOSURE;  Surgeon: Salvadore Dom, MD;  Location: [place];  Service: Gynecology;  Laterality: N/A;   MEDIPORT INSERTION, DOUBLE     MEDIPORT REMOVAL     TOTAL KNEE ARTHROPLASTY Right 06/08/2022   Procedure: RIGHT TOTAL KNEE REPLACEMENT;  Surgeon: Leandrew Koyanagi, MD;  Location: [place];  Service: Orthopedics;  Laterality: Right;   Social History   Occupational History   Occupation: retired from medical billing  Tobacco Use   Smoking status: Former    Packs/day: 0.50    Years: 4.00    Total pack years: 2.00    Types: Cigarettes    Quit date: 12/21/1968    Years since quitting: 53.6   Smokeless tobacco: Never   Tobacco comments:    as teenager  Vaping Use   Vaping Use: Never used  Substance and Sexual Activity   Alcohol use: No   Drug use: No   Sexual activity: Not Currently    Birth control/protection: None

## 2022-07-23 ENCOUNTER — Other Ambulatory Visit: Payer: Self-pay | Admitting: Physician Assistant

## 2022-07-23 DIAGNOSIS — K219 Gastro-esophageal reflux disease without esophagitis: Secondary | ICD-10-CM

## 2022-08-07 ENCOUNTER — Other Ambulatory Visit: Payer: Self-pay

## 2022-08-10 ENCOUNTER — Encounter: Payer: Self-pay | Admitting: Hematology and Oncology

## 2022-08-10 ENCOUNTER — Telehealth: Payer: Self-pay | Admitting: Hematology and Oncology

## 2022-08-10 ENCOUNTER — Inpatient Hospital Stay: Payer: Medicare Other | Attending: Hematology and Oncology | Admitting: Hematology and Oncology

## 2022-08-10 ENCOUNTER — Inpatient Hospital Stay: Payer: Medicare Other

## 2022-08-10 DIAGNOSIS — R609 Edema, unspecified: Secondary | ICD-10-CM | POA: Insufficient documentation

## 2022-08-10 DIAGNOSIS — I700 Atherosclerosis of aorta: Secondary | ICD-10-CM | POA: Diagnosis not present

## 2022-08-10 DIAGNOSIS — J984 Other disorders of lung: Secondary | ICD-10-CM | POA: Diagnosis not present

## 2022-08-10 DIAGNOSIS — K449 Diaphragmatic hernia without obstruction or gangrene: Secondary | ICD-10-CM | POA: Diagnosis not present

## 2022-08-10 DIAGNOSIS — Z885 Allergy status to narcotic agent status: Secondary | ICD-10-CM | POA: Diagnosis not present

## 2022-08-10 DIAGNOSIS — K8689 Other specified diseases of pancreas: Secondary | ICD-10-CM | POA: Diagnosis not present

## 2022-08-10 DIAGNOSIS — C801 Malignant (primary) neoplasm, unspecified: Secondary | ICD-10-CM | POA: Diagnosis not present

## 2022-08-10 DIAGNOSIS — I517 Cardiomegaly: Secondary | ICD-10-CM | POA: Diagnosis not present

## 2022-08-10 DIAGNOSIS — D0511 Intraductal carcinoma in situ of right breast: Secondary | ICD-10-CM | POA: Insufficient documentation

## 2022-08-10 DIAGNOSIS — M5137 Other intervertebral disc degeneration, lumbosacral region: Secondary | ICD-10-CM | POA: Insufficient documentation

## 2022-08-10 DIAGNOSIS — M47816 Spondylosis without myelopathy or radiculopathy, lumbar region: Secondary | ICD-10-CM | POA: Insufficient documentation

## 2022-08-10 DIAGNOSIS — Z881 Allergy status to other antibiotic agents status: Secondary | ICD-10-CM | POA: Insufficient documentation

## 2022-08-10 DIAGNOSIS — Z9049 Acquired absence of other specified parts of digestive tract: Secondary | ICD-10-CM | POA: Insufficient documentation

## 2022-08-10 DIAGNOSIS — K5732 Diverticulitis of large intestine without perforation or abscess without bleeding: Secondary | ICD-10-CM | POA: Insufficient documentation

## 2022-08-10 DIAGNOSIS — Z79899 Other long term (current) drug therapy: Secondary | ICD-10-CM | POA: Insufficient documentation

## 2022-08-10 LAB — HEPATIC FUNCTION PANEL
ALT: 26 U/L (ref 7–35)
AST: 38 — AB (ref 13–35)
Alkaline Phosphatase: 77 (ref 25–125)
Bilirubin, Total: 0.8

## 2022-08-10 LAB — CBC AND DIFFERENTIAL
HCT: 38 (ref 36–46)
Hemoglobin: 12.7 (ref 12.0–16.0)
Neutrophils Absolute: 2.29
Platelets: 155 10*3/uL (ref 150–400)
WBC: 4.4

## 2022-08-10 LAB — LACTATE DEHYDROGENASE: LDH: 174 U/L (ref 98–192)

## 2022-08-10 LAB — CBC: RBC: 4.47 (ref 3.87–5.11)

## 2022-08-10 LAB — COMPREHENSIVE METABOLIC PANEL
Albumin: 4.4 (ref 3.5–5.0)
Calcium: 9.6 (ref 8.7–10.7)

## 2022-08-10 LAB — BASIC METABOLIC PANEL
BUN: 10 (ref 4–21)
CO2: 31 — AB (ref 13–22)
Chloride: 100 (ref 99–108)
Creatinine: 0.6 (ref 0.5–1.1)
Glucose: 87
Potassium: 4 mEq/L (ref 3.5–5.1)
Sodium: 136 — AB (ref 137–147)

## 2022-08-10 NOTE — Telephone Encounter (Signed)
Per 08/10/22 los next appt scheduled and confirmed with patient

## 2022-08-10 NOTE — Assessment & Plan Note (Signed)
Stage 0 ductal carcinoma in situ, treated with lumpectomy. We offered chemoprevention with raloxifene for a total of 5 years, but she declined.  She remains without evidence of disease, nearly 2 years postop.

## 2022-08-10 NOTE — Progress Notes (Signed)
Patient Care Team: Lorrene Reid, PA-C as PCP - General [name], MD as Consulting Physician (Oncology) Clarene Essex, MD as Consulting Physician (Gastroenterology) Alda Berthold, DO as Consulting Physician (Neurology) Edrick Kins, DPM as Consulting Physician (Podiatry) Care, [facility] (Family Medicine) Arta Silence, MD as Consulting Physician (Gastroenterology) Holly Bodily, MD as Referring Physician (Gastroenterology)  Clinic Day:  08/10/2022  Referring physician: Lorrene Reid, PA-C  ASSESSMENT & PLAN:   Assessment & Plan: Ductal carcinoma in situ (DCIS) of right breast Stage 0 ductal carcinoma in situ, treated with lumpectomy. We offered chemoprevention with raloxifene for a total of 5 years, but she declined.  She remains without evidence of disease, nearly 2 years postop.   H/o Cancer:   B- cell lymphoma (spleen and lung) - non-Hodgkin's History of stage IV diffuse large B-cell lymphoma diagnosed in May 2016, treated with 6 cycles of R-CHOP chemotherapy with a complete response.  Most recent imaging from August 2022 reveals no evidence of lymphoma recurrence. As she is over 5 years, we will not plan to repeat imaging unless otherwise indicated.  Mediastinal node vs thyroid nodule, which has been stable. The radiologist feels it is more likely a thyroid nodule.     The patient understands the plans discussed today and is in agreement with them.  She knows to contact our office if she develops concerns prior to her next appointment.    Melodye Ped, NP  [facility] [address] [place] [address] Dept: [phone] Dept Fax: [phone]   Orders Placed This Encounter  Procedures   CBC and differential    This external order was created through the Results Console.   CBC    This external order was created through the Results Console.   Basic metabolic  panel    This external order was created through the Results Console.   Comprehensive metabolic panel    This external order was created through the Results Console.   Hepatic function panel    This external order was created through the Results Console.      CHIEF COMPLAINT:  CC: A 71 year old female with history of breast cancer and lymphoma here for 6 month evaluation  Current Treatment:  Surveillance  INTERVAL HISTORY:  Jasmine Davila is here today for repeat clinical assessment. She denies fevers or chills. She denies pain. Her appetite is good. Her weight has decreased 4 pounds over last 6 months .  I have reviewed the past medical history, past surgical history, social history and family history with the patient and they are unchanged from previous note.  ALLERGIES:  is allergic to doxycycline, metronidazole, and dilaudid [hydromorphone hcl].  MEDICATIONS:  Current Outpatient Medications  Medication Sig Dispense Refill   aspirin EC 81 MG tablet Take 1 tablet (81 mg total) by mouth 2 (two) times daily. To be taken after surgery to prevent blood clots 84 tablet 0   carboxymethylcellulose (REFRESH TEARS) 0.5 % SOLN Place 1 drop into both eyes 3 (three) times daily as needed (dry eyes).     cetirizine (ALLERGY, CETIRIZINE,) 10 MG tablet Take 10 mg by mouth daily.     Cholecalciferol 50 MCG (2000 UT) CAPS Take 2 capsules every day by oral route.     Continuous Blood Gluc Receiver (DEXCOM G6 RECEIVER) DEVI Use to check blood sugars every morning fasting and 2 hours after largest meal 1 each 0   Continuous Blood  Gluc Sensor (DEXCOM G6 SENSOR) MISC Apply new sensor every 10 days.  Use to check blood sugars every morning fasting and 2 hours after largest meal 9 each 1   Continuous Blood Gluc Transmit (DEXCOM G6 TRANSMITTER) MISC Use to check blood sugars every morning fasting and 2 hours after largest meal 1 each 0   famotidine (PEPCID) 20 MG tablet TAKE 1 TABLET BY MOUTH TWICE  DAILY 200  tablet 2   fluticasone (FLONASE) 50 MCG/ACT nasal spray PLACE 1 SPRAY INTO BOTH NOSTRILS DAILY AS NEEDED FOR ALLERGIES OR RHINITIS. 16 g 5   folic acid (FOLVITE) 086 MCG tablet Take 400 mcg by mouth daily.     gabapentin (NEURONTIN) 600 MG tablet TAKE 2 TABLETS BY MOUTH 3 TIMES  DAILY 540 tablet 3   insulin glargine (LANTUS SOLOSTAR) 100 UNIT/ML Solostar Pen Inject 70 Units into the skin daily. 63 mL 1   insulin lispro (HUMALOG KWIKPEN) 100 UNIT/ML KwikPen Inject 20 Units into the skin 3 (three) times daily. INJECT 20 UNITS 3 TIMES A DAY WITH MEALS 54 mL 1   Insulin Pen Needle (B-D UF III MINI PEN NEEDLES) 31G X 5 MM MISC Use as directed. 100 each 0   losartan (COZAAR) 25 MG tablet TAKE 1 TABLET BY MOUTH DAILY 90 tablet 1   methocarbamol (ROBAXIN) 500 MG tablet Take 1 tablet (500 mg total) by mouth 2 (two) times daily as needed. 20 tablet 2   Multiple Vitamin (MULTIVITAMIN) tablet Take 1 tablet by mouth daily.     NIACIN PO Take 1,000 mg by mouth at bedtime. Take 1 hour after the 81 mg aspirin     ondansetron (ZOFRAN-ODT) 4 MG disintegrating tablet Take 1 tablet (4 mg total) by mouth every 8 (eight) hours as needed for nausea or vomiting. 8 tablet 0   No current facility-administered medications for this visit.    HISTORY OF PRESENT ILLNESS:   Oncology History  Ductal carcinoma in situ (DCIS) of right breast  04/10/2020 Cancer Staging   Staging form: Breast, AJCC 8th Edition - Clinical stage from 04/10/2020: Stage 0 (cTis (DCIS), cN0, cM0, G2, ER+, PR+, HER2: Not Assessed) - Signed by [name], MD on 02/07/2022 Histopathologic type: Intraductal carcinoma, noninfiltrating, NOS Stage prefix: Initial diagnosis Histologic grading system: 3 grade system Stage used in treatment planning: Yes National guidelines used in treatment planning: Yes Type of national guideline used in treatment planning: NCCN   06/14/2020 Initial Diagnosis   Ductal carcinoma in situ (DCIS) of right breast        REVIEW OF SYSTEMS:   Constitutional: Denies fevers, chills or abnormal weight loss Eyes: Denies blurriness of vision Ears, nose, mouth, throat, and face: Denies mucositis or sore throat Respiratory: Denies cough, dyspnea or wheezes Cardiovascular: Denies palpitation, chest discomfort or lower extremity swelling Gastrointestinal:  Denies nausea, heartburn or change in bowel habits Skin: Denies abnormal skin rashes Lymphatics: Denies new lymphadenopathy or easy bruising Neurological:Denies numbness, tingling or new weaknesses Behavioral/Psych: Mood is stable, no new changes  All other systems were reviewed with the patient and are negative.   VITALS:  Blood pressure (!) 134/59, pulse 65, temperature 98.3 F (36.8 C), temperature source Oral, resp. rate 20, height 5' 7"  (1.702 m), weight 230 lb 9.6 oz (104.6 kg), last menstrual period 12/21/1998, SpO2 98 %.  Wt Readings from Last 3 Encounters:  08/10/22 230 lb 9.6 oz (104.6 kg)  07/21/22 234 lb (106.1 kg)  07/16/22 234 lb (106.1 kg)  Body mass index is 36.12 kg/m.  Performance status (ECOG): 1 - Symptomatic but completely ambulatory  PHYSICAL EXAM:   GENERAL:alert, no distress and comfortable SKIN: skin color, texture, turgor are normal, no rashes or significant lesions EYES: normal, Conjunctiva are pink and non-injected, sclera clear OROPHARYNX:no exudate, no erythema and lips, buccal mucosa, and tongue normal  NECK: supple, thyroid normal size, non-tender, without nodularity LYMPH:  no palpable lymphadenopathy in the cervical, axillary or inguinal LUNGS: clear to auscultation and percussion with normal breathing effort HEART: regular rate & rhythm and no murmurs and no lower extremity edema ABDOMEN:abdomen soft, non-tender and normal bowel sounds Musculoskeletal:no cyanosis of digits and no clubbing  NEURO: alert & oriented x 3 with fluent speech, no focal motor/sensory deficits  LABORATORY DATA:  I have reviewed  the data as listed    Component Value Date/Time   NA 136 (A) 08/10/2022 0000   K 4.0 08/10/2022 0000   CL 100 08/10/2022 0000   CO2 31 (A) 08/10/2022 0000   GLUCOSE 117 (H) 07/16/2022 1129   BUN 10 08/10/2022 0000   CREATININE 0.6 08/10/2022 0000   CREATININE 0.66 07/16/2022 1129   CREATININE 0.76 11/24/2016 1116   CALCIUM 9.6 08/10/2022 0000   PROT 7.6 07/16/2022 1129   PROT 7.2 03/20/2022 1128   ALBUMIN 4.4 08/10/2022 0000   ALBUMIN 4.4 03/20/2022 1128   AST 38 (A) 08/10/2022 0000   ALT 26 08/10/2022 0000   ALKPHOS 77 08/10/2022 0000   BILITOT 0.7 07/16/2022 1129   BILITOT 0.5 03/20/2022 1128   GFRNONAA >60 07/16/2022 1129   GFRNONAA 83 11/24/2016 1116   GFRAA 102 01/01/2021 1014   GFRAA >89 11/24/2016 1116    No results found for: "SPEP", "UPEP"  Lab Results  Component Value Date   WBC 4.4 08/10/2022   NEUTROABS 2.29 08/10/2022   HGB 12.7 08/10/2022   HCT 38 08/10/2022   MCV 87.2 07/16/2022   PLT 155 08/10/2022      Chemistry      Component Value Date/Time   NA 136 (A) 08/10/2022 0000   K 4.0 08/10/2022 0000   CL 100 08/10/2022 0000   CO2 31 (A) 08/10/2022 0000   BUN 10 08/10/2022 0000   CREATININE 0.6 08/10/2022 0000   CREATININE 0.66 07/16/2022 1129   CREATININE 0.76 11/24/2016 1116   GLU 87 08/10/2022 0000      Component Value Date/Time   CALCIUM 9.6 08/10/2022 0000   ALKPHOS 77 08/10/2022 0000   AST 38 (A) 08/10/2022 0000   ALT 26 08/10/2022 0000   BILITOT 0.7 07/16/2022 1129   BILITOT 0.5 03/20/2022 1128       RADIOGRAPHIC STUDIES: I have personally reviewed the radiological images as listed and agreed with the findings in the report. XR Knee 1-2 Views Right  Result Date: 07/22/2022 Stable total knee replacement in good alignment   CT Abdomen Pelvis W Contrast  Result Date: 07/16/2022 CLINICAL DATA:  Right breast cancer. Additional history of large B-lymphoma. Left lower quadrant abdominal pain. History of diverticulitis. * Tracking Code:  BO * EXAM: CT ABDOMEN AND PELVIS WITH CONTRAST TECHNIQUE: Multidetector CT imaging of the abdomen and pelvis was performed using the standard protocol following bolus administration of intravenous contrast. RADIATION DOSE REDUCTION: This exam was performed according to the departmental dose-optimization program which includes automated exposure control, adjustment of the mA and/or kV according to patient size and/or use of iterative reconstruction technique. CONTRAST:  158m OMNIPAQUE IOHEXOL 300 MG/ML  SOLN COMPARISON:  08/05/2021 FINDINGS: Lower chest: Stable left lower lobe scarring. Mild cardiomegaly. Suspected small type 1 hiatal hernia. Hepatobiliary: Prior cholecystectomy. Intrahepatic and extrahepatic biliary dilatation, common bile duct about 1.3 cm in diameter on image 30 of series 3 which is stable. No well-defined ampullary lesion. Overall the degree of biliary dilatation is stable. Subtle subcapsular hypodensity anteriorly in the right hepatic lobe on image 30 of series 3, probably from some mild focal steatosis. Pancreas: Mild dorsal pancreatic duct dilatation, similar to previous. Spleen: Stable subtle linear hypodensity anteriorly in the spleen on image 18 series 3. Spleen measures 13.0 by 5.6 by 10.3 cm (volume = 390 cm^3), within normal size limits. Adrenals/Urinary Tract: Unremarkable Stomach/Bowel: Sigmoid colon diverticulosis with mild to moderate acute diverticulitis shown on image 42 series 6. No extraluminal gas or abscess. Adjacent mesenteric inflammatory stranding. Trace free pelvic fluid. Vascular/Lymphatic: Atherosclerosis is present, including aortoiliac atherosclerotic disease. No pathologic adenopathy identified. Reproductive: Unremarkable Other: No supplemental non-categorized findings. Musculoskeletal: Lower thoracic and lumbar spondylosis and degenerative disc disease, resulting in prominent bilateral foraminal impingement at L5-S1 where there is intervertebral and facet spurring.  IMPRESSION: 1. Mild to moderate acute diverticulitis of the proximal sigmoid colon, with inflamed diverticulum and surrounding mesenteric edema but no extraluminal gas or abscess. Trace free pelvic fluid. 2. Chronic intrahepatic and extrahepatic biliary dilatation, no change from prior exam. 3. Subtle subcapsular hypodensity anteriorly in the right hepatic lobe, given the indistinct nature of this is probably from mild focal steatosis. 4. Other imaging findings of potential clinical significance: Aortic Atherosclerosis (ICD10-I70.0). Prominent bilateral foraminal impingement at L5-S1 due to spurring. Mild cardiomegaly. Small type 1 hiatal hernia. Stable left lower lobe scarring. Electronically Signed   By: Van Clines M.D.   On: 07/16/2022 14:13

## 2022-08-10 NOTE — Assessment & Plan Note (Addendum)
History of stage IV diffuse large B-cell lymphoma diagnosed in May 2016, treated with 6 cycles of R-CHOP chemotherapy with a complete response.  Most recent imaging from August 2022 reveals no evidence of lymphoma recurrence. As she is over 5 years, we will not plan to repeat imaging unless otherwise indicated.  Mediastinal node vs thyroid nodule, which has been stable. The radiologist feels it is more likely a thyroid nodule.

## 2022-08-14 ENCOUNTER — Other Ambulatory Visit: Payer: Self-pay | Admitting: Physician Assistant

## 2022-09-02 ENCOUNTER — Encounter: Payer: Self-pay | Admitting: Orthopaedic Surgery

## 2022-09-02 ENCOUNTER — Ambulatory Visit: Payer: Medicare Other | Admitting: Orthopaedic Surgery

## 2022-09-02 DIAGNOSIS — Z96651 Presence of right artificial knee joint: Secondary | ICD-10-CM

## 2022-09-02 NOTE — Progress Notes (Signed)
Post-Op Visit Note   Patient: Jasmine Davila           Date of Birth: July 09, 1951           MRN: [id number] Visit Date: 09/02/2022 PCP: Lorrene Reid, PA-C   Assessment & Plan:  Chief Complaint:  Chief Complaint  Patient presents with   Right Knee - Follow-up    Right total knee arthroplasty 06/08/2022   Visit Diagnoses:  1. Hx of total knee replacement, right     Plan: Patient is a pleasant 71 year old female who comes in today 3 months status post right total knee replacement, date of surgery 06/08/2022.  She has been doing well.  She has been in physical therapy making good progress.  Examination of the right knee reveals range of motion 0 to 120 degrees.  She is stable vascular stress.  She is neurovascular tact distally.  At this point, she will continue to advance with activity as tolerated.Dental prophylaxis reinforced.  Follow-up with [place] in 3 months time for repeat evaluation and 2 view x-rays of the right knee.  Call with concerns or questions.  Follow-Up Instructions: Return in about 3 months (around 12/02/2022).   Orders:  No orders of the defined types were placed in this encounter.  No orders of the defined types were placed in this encounter.   Imaging: No new imaging  PMFS History: Patient Active Problem List   Diagnosis Date Noted   Status post total right knee replacement 06/08/2022   Primary osteoarthritis of right knee 06/07/2022   Ductal carcinoma in situ (DCIS) of right breast 06/14/2020   Acute pancreatitis 06/21/2019   Pancreatitis 06/21/2019   Acute diverticulitis 06/21/2019   Abnormal LFTs 06/21/2019   Hyperbilirubinemia 06/21/2019   Left leg swelling 06/21/2019   Hyponatremia 06/21/2019   Hypochloremia 06/21/2019   Obesity 06/21/2019   Vitamin D insufficiency 04/11/2019   Lumbar spondylosis 02/28/2019   Chronic back pain 02/07/2019   Inflamed acrochordon 02/07/2019   Acrochordon- over R eye interferring with vision 02/07/2019    Acute otitis media 11/23/2018   Acute maxillary sinusitis 11/23/2018   History of malignant lymphoma 06/03/2018   Neuropathy 06/03/2018   Endometrial disorder- trace endometrial canal fluid seen on CT scan recently and in 2016.  No GYN care many yrs, to GYN for TVUS and further w/up prn 04/20/2018   Diabetic peripheral neuropathy associated with type 2 diabetes mellitus ([place]) 04/20/2018   Acute on chronic pancreatitis ([place]) 04/13/2018   Onychomycosis 03/16/2018   Neuropathic pain syndrome (non-herpetic) 03/16/2018   Sleep apnea with CPAP 12/29/2016   History of decreased platelet count 12/29/2016   Morbid obesity ([place]) 11/25/2016   Diabetes mellitus due to underlying condition with diabetic nephropathy, with long-term current use of insulin ([place]) 11/25/2016   Smoking hx 11/24/2016   Hypertension associated with diabetes ([place]) 11/24/2016   Chronic pancreatitis ([place]) 11/24/2016   H/o Cancer:   B- cell lymphoma (spleen and lung) - non-Hodgkin's 11/24/2016   Diabetic neuropathy - exaccerbated by chemo 11/24/2016   GERD (gastroesophageal reflux disease) 11/24/2016   Hyperlipidemia associated with type 2 diabetes mellitus ([place]) 11/24/2016   B12 deficiency 11/24/2016   Vitamin B6 deficiency (non anemic) 11/24/2016   Past Medical History:  Diagnosis Date   Abnormal EKG    hx of right bundle branch block   Acute pancreatitis    HX OF   Anemia yrs ago   Breast cancer ([facility] [facility]) 2021   Cancer ([facility]) dx 2016   B  cell lymphoma- Non Hodgkins in remission   Diabetes mellitus ([facility])    TYPE 2   Diabetic neuropathy ([facility])    Diabetic neuropathy (H[facility])    Endometrial disorder    GERD (gastroesophageal reflux disease)    Headache    tension or sinus   Hypertension    Malignant lymphoma (H[facility])    Morbid obesity (H[facility])    Peripheral neuropathy    BOTH FEET, SLIGHT NEUROPATHY IN HANDS   Sleep apnea    USES CPAP at times   Vitamin B 12 deficiency     Family History  Problem Relation Age of Onset    Cancer Mother 21       colon   Hypertension Mother    Osteoporosis Mother    Heart attack Father    Heart disease Sister    Alcohol abuse Maternal Grandfather     Past Surgical History:  Procedure Laterality Date   BREAST LUMPECTOMY Right 2021   CHOLECYSTECTOMY  03/06/1996   colomscopy     DILATATION & CURETTAGE/HYSTEROSCOPY WITH MYOSURE N/A 07/04/2018   Procedure: DILATATION & CURETTAGE/HYSTEROSCOPY WITH MYOSURE;  Surgeon: Salvadore Dom, MD;  Location: [facility];  Service: Gynecology;  Laterality: N/A;   MEDIPORT INSERTION, DOUBLE     MEDIPORT REMOVAL     TOTAL KNEE ARTHROPLASTY Right 06/08/2022   Procedure: RIGHT TOTAL KNEE REPLACEMENT;  Surgeon: Leandrew Koyanagi, MD;  Location: [facility];  Service: Orthopedics;  Laterality: Right;   Social History   Occupational History   Occupation: retired from medical billing  Tobacco Use   Smoking status: Former    Packs/day: 0.50    Years: 4.00    Total pack years: 2.00    Types: Cigarettes    Quit date: 12/21/1968    Years since quitting: 53.7   Smokeless tobacco: Never   Tobacco comments:    as teenager  Vaping Use   Vaping Use: Never used  Substance and Sexual Activity   Alcohol use: No   Drug use: No   Sexual activity: Not Currently    Birth control/protection: None

## 2022-09-16 ENCOUNTER — Telehealth: Payer: Self-pay

## 2022-09-16 NOTE — Telephone Encounter (Signed)
Patient called office regarding her medication citalopram, patient stated that she used to take 2-mg half tablets daily but changed providers so now its changes to '10mg'$  1 1/2 tablets daily but patient thinks it should be just '10mg'$  daily, please advise, thanks!

## 2022-09-16 NOTE — Telephone Encounter (Signed)
Spoke to patient regarding message below. Patient states she does not take that medication and was calling to schedule an appointment. There is no record of this medication in  patients chart.

## 2022-09-28 ENCOUNTER — Other Ambulatory Visit: Payer: Self-pay | Admitting: Physician Assistant

## 2022-09-28 DIAGNOSIS — Z794 Long term (current) use of insulin: Secondary | ICD-10-CM

## 2022-09-29 ENCOUNTER — Other Ambulatory Visit: Payer: Self-pay

## 2022-09-29 ENCOUNTER — Encounter: Payer: Self-pay | Admitting: Physician Assistant

## 2022-09-29 ENCOUNTER — Telehealth: Payer: Self-pay

## 2022-09-29 ENCOUNTER — Other Ambulatory Visit: Payer: Self-pay | Admitting: Physician Assistant

## 2022-09-29 DIAGNOSIS — Z794 Long term (current) use of insulin: Secondary | ICD-10-CM

## 2022-09-29 DIAGNOSIS — E1159 Type 2 diabetes mellitus with other circulatory complications: Secondary | ICD-10-CM

## 2022-09-29 MED ORDER — LOSARTAN POTASSIUM 25 MG PO TABS: 25.0000 mg | ORAL_TABLET | Freq: Every day | ORAL | 0 refills | Status: DC

## 2022-09-29 NOTE — Telephone Encounter (Signed)
[facility] per Provider change in Rx for 90 days is ok

## 2022-09-29 NOTE — Telephone Encounter (Signed)
Pharmacy is calling requesting a 90 day supply of: LANTUS SOLOSTAR 100 UNIT/ML Solostar Pen   Current Rx that was sent in today was a 21 day supply.  Pharmacy: [facility], [place]   Please advise

## 2022-10-12 ENCOUNTER — Encounter: Payer: Self-pay | Admitting: Orthopaedic Surgery

## 2022-10-21 ENCOUNTER — Encounter: Payer: Self-pay | Admitting: Physician Assistant

## 2022-10-21 ENCOUNTER — Ambulatory Visit (INDEPENDENT_AMBULATORY_CARE_PROVIDER_SITE_OTHER): Payer: Medicare Other | Admitting: Physician Assistant

## 2022-10-21 VITALS — BP 118/77 | HR 71 | Resp 18 | Wt 231.4 lb

## 2022-10-21 DIAGNOSIS — E1169 Type 2 diabetes mellitus with other specified complication: Secondary | ICD-10-CM | POA: Diagnosis not present

## 2022-10-21 DIAGNOSIS — E0821 Diabetes mellitus due to underlying condition with diabetic nephropathy: Secondary | ICD-10-CM | POA: Diagnosis not present

## 2022-10-21 DIAGNOSIS — E1159 Type 2 diabetes mellitus with other circulatory complications: Secondary | ICD-10-CM

## 2022-10-21 DIAGNOSIS — E785 Hyperlipidemia, unspecified: Secondary | ICD-10-CM

## 2022-10-21 DIAGNOSIS — E559 Vitamin D deficiency, unspecified: Secondary | ICD-10-CM

## 2022-10-21 DIAGNOSIS — Z23 Encounter for immunization: Secondary | ICD-10-CM | POA: Diagnosis not present

## 2022-10-21 DIAGNOSIS — E538 Deficiency of other specified B group vitamins: Secondary | ICD-10-CM

## 2022-10-21 DIAGNOSIS — E1142 Type 2 diabetes mellitus with diabetic polyneuropathy: Secondary | ICD-10-CM

## 2022-10-21 DIAGNOSIS — I152 Hypertension secondary to endocrine disorders: Secondary | ICD-10-CM

## 2022-10-21 DIAGNOSIS — Z794 Long term (current) use of insulin: Secondary | ICD-10-CM

## 2022-10-21 DIAGNOSIS — E531 Pyridoxine deficiency: Secondary | ICD-10-CM

## 2022-10-21 DIAGNOSIS — Z1329 Encounter for screening for other suspected endocrine disorder: Secondary | ICD-10-CM

## 2022-10-21 LAB — POCT GLYCOSYLATED HEMOGLOBIN (HGB A1C): HbA1c POC (<> result, manual entry): 5.8 % (ref 4.0–5.6)

## 2022-10-21 NOTE — Assessment & Plan Note (Signed)
>>  ASSESSMENT AND PLAN FOR DIABETIC NEUROPATHY - EXACCERBATED BY CHEMO WRITTEN ON 10/21/2022 11:26 AM BY ABONZA, MARITZA, PA-C  -Uncontrolled. Discussed with patient we can taper off Gabapentin and start Lyrica. Patient plans to check with her insurance regarding Lyrica coverage, previously expensive. For now will continue with Gabapentin.

## 2022-10-21 NOTE — Assessment & Plan Note (Signed)
-  Controlled. A1c today has improved from 6.1 to 5.8, will continue with Lantus  70 units daily and Humalog 20 units TID. Continue to use CGM and follow a diabetic diet low in carbohydrates and sugar intake.

## 2022-10-21 NOTE — Assessment & Plan Note (Signed)
-  Last lipid panel normal, LDL 74 (goal<70). Will repeat lipid panel today. Recommend to follow a heart healthy diet low in fat. Patient taking niacin.

## 2022-10-21 NOTE — Assessment & Plan Note (Signed)
-  Controlled. Continue Losartan 25 mg daily. Will collect CMP to monitor renal function and electrolytes.

## 2022-10-21 NOTE — Progress Notes (Signed)
Established patient visit   Patient: Jasmine Davila   DOB: [birth year]   71 y.o. Female  MRN: [id number] Visit Date: 10/21/2022  Chief Complaint  Patient presents with   Diabetes   Subjective    HPI  Patient presents for chronic follow-up visit. Patient reports her neuropathy has been worse, at night has trouble with sleeping because of the pain. Taking Gabapentin 1200 mg three times daily.   Diabetes: No increased urination or thirst. Pt reports medication compliance. No hypoglycemic events. Reports when gets an alert for low blood sugar will double check with a fingerstick because it is not always accurate. Checking glucose at home with CGM.   HTN: No chest pain, palpitations, dizziness or lower extremity swelling. Taking medication as directed without side effects.   HLD: Pt taking niacin. Reports has not been as good with her diet. Patient still recovering from her right knee replacement.   Medications: Outpatient Medications Prior to Visit  Medication Sig   aspirin EC 81 MG tablet Take 1 tablet (81 mg total) by mouth 2 (two) times daily. To be taken after surgery to prevent blood clots   carboxymethylcellulose (REFRESH TEARS) 0.5 % SOLN Place 1 drop into both eyes 3 (three) times daily as needed (dry eyes).   cetirizine (ALLERGY, CETIRIZINE,) 10 MG tablet Take 10 mg by mouth daily.   Continuous Blood Gluc Receiver (DEXCOM G6 RECEIVER) DEVI Use to check blood sugars every morning fasting and 2 hours after largest meal   Continuous Blood Gluc Sensor (DEXCOM G6 SENSOR) MISC Apply new sensor every 10 days.  Use to check blood sugars every morning fasting and 2 hours after largest meal   Continuous Blood Gluc Transmit (DEXCOM G6 TRANSMITTER) MISC Use to check blood sugars every morning fasting and 2 hours after largest meal   famotidine (PEPCID) 20 MG tablet TAKE 1 TABLET BY MOUTH TWICE  DAILY   fluticasone (FLONASE) 50 MCG/ACT nasal spray PLACE 1 SPRAY INTO BOTH NOSTRILS  DAILY AS NEEDED FOR ALLERGIES OR RHINITIS.   folic acid (FOLVITE) 188 MCG tablet Take 400 mcg by mouth daily.   gabapentin (NEURONTIN) 600 MG tablet TAKE 2 TABLETS BY MOUTH 3 TIMES  DAILY   GLOBAL EASE INJECT PEN NEEDLES 31G X 5 MM MISC USE AS DIRECTED WITH INSULIN PENS (4 PER DAY)   insulin lispro (HUMALOG KWIKPEN) 100 UNIT/ML KwikPen Inject 20 Units into the skin 3 (three) times daily. INJECT 20 UNITS 3 TIMES A DAY WITH MEALS   LANTUS SOLOSTAR 100 UNIT/ML Solostar Pen INJECT 70 UNITS INTO THE SKIN DAILY.   losartan (COZAAR) 25 MG tablet Take 1 tablet (25 mg total) by mouth daily.   Multiple Vitamin (MULTIVITAMIN) tablet Take 1 tablet by mouth daily.   NIACIN PO Take 1,000 mg by mouth at bedtime. Take 1 hour after the 81 mg aspirin   No facility-administered medications prior to visit.    Review of Systems Review of Systems:  A fourteen system review of systems was performed and found to be positive as per HPI.  Last CBC Lab Results  Component Value Date   WBC 4.4 08/10/2022   HGB 12.7 08/10/2022   HCT 38 08/10/2022   MCV 87.2 07/16/2022   MCH 28.8 07/16/2022   RDW 13.4 07/16/2022   PLT 155 41/66/0630   Last metabolic panel Lab Results  Component Value Date   GLUCOSE 117 (H) 07/16/2022   NA 136 (A) 08/10/2022   K 4.0 08/10/2022   CL 100 08/10/2022  CO2 31 (A) 08/10/2022   BUN 10 08/10/2022   CREATININE 0.6 08/10/2022   GFRNONAA >60 07/16/2022   CALCIUM 9.6 08/10/2022   PHOS 2.1 (L) 06/22/2019   PROT 7.6 07/16/2022   ALBUMIN 4.4 08/10/2022   LABGLOB 2.8 03/20/2022   AGRATIO 1.6 03/20/2022   BILITOT 0.7 07/16/2022   ALKPHOS 77 08/10/2022   AST 38 (A) 08/10/2022   ALT 26 08/10/2022   ANIONGAP 9 07/16/2022   Last lipids Lab Results  Component Value Date   CHOL 155 03/20/2022   HDL 68 03/20/2022   LDLCALC 74 03/20/2022   TRIG 68 03/20/2022   CHOLHDL 2.3 03/20/2022   Last hemoglobin A1c Lab Results  Component Value Date   HGBA1C 5.8 10/21/2022   Last  thyroid functions Lab Results  Component Value Date   TSH 1.510 01/01/2021   Last vitamin D Lab Results  Component Value Date   VD25OH 70.7 10/07/2021       Objective    BP 118/77 (BP Location: Right Arm, Patient Position: Sitting, Cuff Size: Large)   Pulse 71   Resp 18   Wt 231 lb 6.4 oz (105 kg)   LMP 12/21/1998 (Approximate)   SpO2 100%   BMI 36.24 kg/m  BP Readings from Last 3 Encounters:  10/21/22 118/77  08/10/22 (!) 134/59  07/21/22 108/68   Wt Readings from Last 3 Encounters:  10/21/22 231 lb 6.4 oz (105 kg)  08/10/22 230 lb 9.6 oz (104.6 kg)  07/21/22 234 lb (106.1 kg)    Physical Exam  General:  Well Developed, well nourished, appropriate for stated age.  Neuro:  Alert and oriented,  extra-ocular muscles intact  HEENT:  Normocephalic, atraumatic, neck supple  Skin:  no gross rash, warm, pink. Cardiac:  RRR, S1 S2 Respiratory: CTA B/L  Vascular:  Ext warm, no cyanosis apprec.; cap RF less 2 sec. Psych:  No HI/SI, judgement and insight good, Euthymic mood. Full Affect.   Results for orders placed or performed in visit on 10/21/22  POCT HgB A1C  Result Value Ref Range   Hemoglobin A1C     HbA1c POC (<> result, manual entry) 5.8 4.0 - 5.6 %   HbA1c, POC (prediabetic range)     HbA1c, POC (controlled diabetic range)      Assessment & Plan      Problem List Items Addressed This Visit       Cardiovascular and Mediastinum   Hypertension associated with diabetes ([place]) (Chronic)    -Controlled. Continue Losartan 25 mg daily. Will collect CMP to monitor renal function and electrolytes.      Relevant Orders   POCT HgB A1C (Completed)   CBC with Differential/Platelet   Comprehensive metabolic panel     Endocrine   Diabetic neuropathy - exaccerbated by chemo (Chronic)    -Uncontrolled. Discussed with patient we can taper off Gabapentin and start Lyrica. Patient plans to check with her insurance regarding Lyrica coverage, previously expensive. For now  will continue with Gabapentin.       Relevant Orders   TSH   Hyperlipidemia associated with type 2 diabetes mellitus (HCC) (Chronic)    -Last lipid panel normal, LDL 74 (goal<70). Will repeat lipid panel today. Recommend to follow a heart healthy diet low in fat. Patient taking niacin.      Relevant Orders   POCT HgB A1C (Completed)   Lipid panel   Diabetes mellitus due to underlying condition with diabetic nephropathy, with long-term current use of insulin ([place]) -  Primary (Chronic)    -Controlled. A1c today has improved from 6.1 to 5.8, will continue with Lantus  70 units daily and Humalog 20 units TID. Continue to use CGM and follow a diabetic diet low in carbohydrates and sugar intake.       Relevant Orders   POCT HgB A1C (Completed)     Other   B12 deficiency (Chronic)   Relevant Orders   B12   Vitamin B6 deficiency (non anemic) (Chronic)   Relevant Orders   Vitamin B6   Vitamin D insufficiency   Relevant Orders   Vitamin D (25 hydroxy)   Other Visit Diagnoses     Screening for thyroid disorder       Relevant Orders   TSH   Need for influenza vaccination       Relevant Orders   Flu Vaccine QUAD High Dose(Fluad) (Completed)      Patient is fasting so will collect routine fasting labs.  Pt agreeable to flu vaccine.  Return in about 3 months (around 01/21/2023) for [facility]        Lorrene Reid, PA-C  [facility] at [facility] [phone] (phone) [phone] (fax)  [facility]

## 2022-10-21 NOTE — Assessment & Plan Note (Signed)
-  Uncontrolled. Discussed with patient we can taper off Gabapentin and start Lyrica. Patient plans to check with her insurance regarding Lyrica coverage, previously expensive. For now will continue with Gabapentin.

## 2022-10-23 ENCOUNTER — Other Ambulatory Visit: Payer: Self-pay | Admitting: Physician Assistant

## 2022-10-23 ENCOUNTER — Encounter: Payer: Self-pay | Admitting: Physician Assistant

## 2022-10-23 LAB — LIPID PANEL
Chol/HDL Ratio: 2.5 ratio (ref 0.0–4.4)
Cholesterol, Total: 185 mg/dL (ref 100–199)
HDL: 75 mg/dL (ref >39)
LDL Chol Calc (NIH): 91 mg/dL (ref 0–99)
Triglycerides: 109 mg/dL (ref 0–149)
VLDL Cholesterol Cal: 19 mg/dL (ref 5–40)

## 2022-10-23 LAB — COMPREHENSIVE METABOLIC PANEL
ALT: 17 IU/L (ref 0–32)
AST: 21 IU/L (ref 0–40)
Albumin/Globulin Ratio: 1.6 (ref 1.2–2.2)
Albumin: 4.8 g/dL (ref 3.8–4.8)
Alkaline Phosphatase: 94 IU/L (ref 44–121)
BUN/Creatinine Ratio: 16 (ref 12–28)
BUN: 13 mg/dL (ref 8–27)
Bilirubin Total: 0.5 mg/dL (ref 0.0–1.2)
CO2: 27 mmol/L (ref 20–29)
Calcium: 10.3 mg/dL (ref 8.7–10.3)
Chloride: 96 mmol/L (ref 96–106)
Creatinine, Ser: 0.81 mg/dL (ref 0.57–1.00)
Globulin, Total: 3 g/dL (ref 1.5–4.5)
Glucose: 129 mg/dL — ABNORMAL HIGH (ref 70–99)
Potassium: 4.4 mmol/L (ref 3.5–5.2)
Sodium: 136 mmol/L (ref 134–144)
Total Protein: 7.8 g/dL (ref 6.0–8.5)
eGFR: 78 mL/min/{1.73_m2} (ref >59)

## 2022-10-23 LAB — CBC WITH DIFFERENTIAL/PLATELET
Basophils Absolute: 0 10*3/uL (ref 0.0–0.2)
Basos: 1 %
EOS (ABSOLUTE): 0.1 10*3/uL (ref 0.0–0.4)
Eos: 2 %
Hematocrit: 40.7 % (ref 34.0–46.6)
Hemoglobin: 14.1 g/dL (ref 11.1–15.9)
Immature Grans (Abs): 0 10*3/uL (ref 0.0–0.1)
Immature Granulocytes: 0 %
Lymphocytes Absolute: 1.6 10*3/uL (ref 0.7–3.1)
Lymphs: 30 %
MCH: 28.7 pg (ref 26.6–33.0)
MCHC: 34.6 g/dL (ref 31.5–35.7)
MCV: 83 fL (ref 79–97)
Monocytes Absolute: 0.3 10*3/uL (ref 0.1–0.9)
Monocytes: 6 %
Neutrophils Absolute: 3.3 10*3/uL (ref 1.4–7.0)
Neutrophils: 61 %
Platelets: 181 10*3/uL (ref 150–450)
RBC: 4.92 x10E6/uL (ref 3.77–5.28)
RDW: 12.7 % (ref 11.7–15.4)
WBC: 5.3 10*3/uL (ref 3.4–10.8)

## 2022-10-23 LAB — VITAMIN D 25 HYDROXY (VIT D DEFICIENCY, FRACTURES): Vit D, 25-Hydroxy: 62.4 ng/mL (ref 30.0–100.0)

## 2022-10-23 LAB — VITAMIN B6: Vitamin B6: 57.6 ug/L (ref 3.4–65.2)

## 2022-10-23 LAB — VITAMIN B12: Vitamin B-12: 691 pg/mL (ref 232–1245)

## 2022-10-23 LAB — TSH: TSH: 1.54 u[IU]/mL (ref 0.450–4.500)

## 2022-10-23 MED ORDER — FLUTICASONE PROPIONATE 50 MCG/ACT NA SUSP: 1.0000 | Freq: Every day | NASAL | 5 refills | Status: DC | PRN

## 2022-10-27 ENCOUNTER — Other Ambulatory Visit: Payer: Self-pay | Admitting: Physician Assistant

## 2022-10-27 ENCOUNTER — Encounter: Payer: Self-pay | Admitting: Orthopaedic Surgery

## 2022-10-27 MED ORDER — TRAMADOL HCL 50 MG PO TABS: 50.0000 mg | ORAL_TABLET | Freq: Two times a day (BID) | ORAL | 0 refills | Status: DC | PRN

## 2022-10-27 NOTE — Telephone Encounter (Signed)
Sent in tramadol

## 2022-12-02 ENCOUNTER — Encounter: Payer: Self-pay | Admitting: Orthopaedic Surgery

## 2022-12-02 ENCOUNTER — Ambulatory Visit (INDEPENDENT_AMBULATORY_CARE_PROVIDER_SITE_OTHER): Payer: Medicare Other

## 2022-12-02 ENCOUNTER — Ambulatory Visit (INDEPENDENT_AMBULATORY_CARE_PROVIDER_SITE_OTHER): Payer: Medicare Other | Admitting: Orthopaedic Surgery

## 2022-12-02 DIAGNOSIS — Z96651 Presence of right artificial knee joint: Secondary | ICD-10-CM

## 2022-12-02 NOTE — Progress Notes (Signed)
Post-Op Visit Note   Patient: Jasmine Davila           Date of Birth: 04-12-1951           MRN: [id number] Visit Date: 12/02/2022 PCP: Lorrene Reid, PA-C   Assessment & Plan:  Chief Complaint:  Chief Complaint  Patient presents with   Right Knee - Follow-up    Right total knee arthroplasty 06/08/2022   Visit Diagnoses:  1. Hx of total knee replacement, right     Plan: Patient is a pleasant 71 year old female who comes in today 6 months status post right total knee replacement 06/08/2022.  She is doing better but still occasionally sore.  She is taking tramadol as needed.  She has recently finished physical therapy but continues to work on home exercise program.  Examination of her right knee reveals range of motion from 0 to 105 degrees.  She is stable to valgus varus stress.  She is neurovascularly intact distally.  At this point.  Patient will continue with her home exercise program.  We have discussed the possibility of returning to outpatient physical therapy but she would like to wait to the first of the year before deciding.  She will let us know if she would like to proceed.  She will follow-up with [place] in 3 months for repeat evaluation.  She will call with concerns or questions in the meantime.  Follow-Up Instructions: Return in about 3 months (around 03/03/2023).   Orders:  Orders Placed This Encounter  Procedures   XR Knee 1-2 Views Right   No orders of the defined types were placed in this encounter.   Imaging: No results found.  PMFS History: Patient Active Problem List   Diagnosis Date Noted   Status post total right knee replacement 06/08/2022   Primary osteoarthritis of right knee 06/07/2022   Ductal carcinoma in situ (DCIS) of right breast 06/14/2020   Acute pancreatitis 06/21/2019   Pancreatitis 06/21/2019   Acute diverticulitis 06/21/2019   Abnormal LFTs 06/21/2019   Hyperbilirubinemia 06/21/2019   Left leg swelling 06/21/2019    Hyponatremia 06/21/2019   Hypochloremia 06/21/2019   Obesity 06/21/2019   Vitamin D insufficiency 04/11/2019   Lumbar spondylosis 02/28/2019   Chronic back pain 02/07/2019   Inflamed acrochordon 02/07/2019   Acrochordon- over R eye interferring with vision 02/07/2019   Acute otitis media 11/23/2018   Acute maxillary sinusitis 11/23/2018   History of malignant lymphoma 06/03/2018   Neuropathy 06/03/2018   Endometrial disorder- trace endometrial canal fluid seen on CT scan recently and in 2016.  No GYN care many yrs, to GYN for TVUS and further w/up prn 04/20/2018   Diabetic peripheral neuropathy associated with type 2 diabetes mellitus ([place]) 04/20/2018   Acute on chronic pancreatitis ([place]) 04/13/2018   Onychomycosis 03/16/2018   Neuropathic pain syndrome (non-herpetic) 03/16/2018   Sleep apnea with CPAP 12/29/2016   History of decreased platelet count 12/29/2016   Morbid obesity ([place]) 11/25/2016   Diabetes mellitus due to underlying condition with diabetic nephropathy, with long-term current use of insulin ([place]) 11/25/2016   Smoking hx 11/24/2016   Hypertension associated with diabetes ([place]) 11/24/2016   Chronic pancreatitis ([place]) 11/24/2016   H/o Cancer:   B- cell lymphoma (spleen and lung) - non-Hodgkin's 11/24/2016   Diabetic neuropathy - exaccerbated by chemo 11/24/2016   GERD (gastroesophageal reflux disease) 11/24/2016   Hyperlipidemia associated with type 2 diabetes mellitus ([place]) 11/24/2016   B12 deficiency 11/24/2016   Vitamin B6 deficiency (non  anemic) 11/24/2016   Past Medical History:  Diagnosis Date   Abnormal EKG    hx of right bundle branch block   Acute pancreatitis    HX OF   Anemia yrs ago   Breast cancer ([place]) 2021   Cancer ([place]) dx 2016   B cell lymphoma- Non Hodgkins in remission   Diabetes mellitus (HCC)    TYPE 2   Diabetic neuropathy (HCC)    Diabetic neuropathy (HCC)    Endometrial disorder    GERD (gastroesophageal reflux disease)    Headache     tension or sinus   Hypertension    Malignant lymphoma (HCC)    Morbid obesity (HCC)    Peripheral neuropathy    BOTH FEET, SLIGHT NEUROPATHY IN HANDS   Sleep apnea    USES CPAP at times   Vitamin B 12 deficiency     Family History  Problem Relation Age of Onset   Cancer Mother 58       colon   Hypertension Mother    Osteoporosis Mother    Heart attack Father    Heart disease Sister    Alcohol abuse Maternal Grandfather     Past Surgical History:  Procedure Laterality Date   BREAST LUMPECTOMY Right 2021   CHOLECYSTECTOMY  03/06/1996   colomscopy     DILATATION & CURETTAGE/HYSTEROSCOPY WITH MYOSURE N/A 07/04/2018   Procedure: DILATATION & CURETTAGE/HYSTEROSCOPY WITH MYOSURE;  Surgeon: Salvadore Dom, MD;  Location: [place];  Service: Gynecology;  Laterality: N/A;   MEDIPORT INSERTION, DOUBLE     MEDIPORT REMOVAL     TOTAL KNEE ARTHROPLASTY Right 06/08/2022   Procedure: RIGHT TOTAL KNEE REPLACEMENT;  Surgeon: Leandrew Koyanagi, MD;  Location: [place];  Service: Orthopedics;  Laterality: Right;   Social History   Occupational History   Occupation: retired from medical billing  Tobacco Use   Smoking status: Former    Packs/day: 0.50    Years: 4.00    Total pack years: 2.00    Types: Cigarettes    Quit date: 12/21/1968    Years since quitting: 53.9   Smokeless tobacco: Never   Tobacco comments:    as teenager  Vaping Use   Vaping Use: Never used  Substance and Sexual Activity   Alcohol use: No   Drug use: No   Sexual activity: Not Currently    Birth control/protection: None

## 2022-12-29 ENCOUNTER — Other Ambulatory Visit: Payer: Self-pay

## 2022-12-29 DIAGNOSIS — E1142 Type 2 diabetes mellitus with diabetic polyneuropathy: Secondary | ICD-10-CM

## 2022-12-29 MED ORDER — GABAPENTIN 600 MG PO TABS: ORAL_TABLET | ORAL | 3 refills | Status: DC

## 2023-01-12 ENCOUNTER — Other Ambulatory Visit: Payer: Self-pay

## 2023-01-12 DIAGNOSIS — I152 Hypertension secondary to endocrine disorders: Secondary | ICD-10-CM

## 2023-01-12 MED ORDER — LOSARTAN POTASSIUM 25 MG PO TABS: 25.0000 mg | ORAL_TABLET | Freq: Every day | ORAL | 0 refills | Status: DC

## 2023-01-15 ENCOUNTER — Other Ambulatory Visit: Payer: Self-pay | Admitting: Nurse Practitioner

## 2023-01-15 DIAGNOSIS — Z794 Long term (current) use of insulin: Secondary | ICD-10-CM

## 2023-01-15 NOTE — Telephone Encounter (Signed)
L.O.V: 10/21/22  N.O.V: not scheduled   L.R.F: 09/29/22 Lantus 15 ml 1 refill    30 day refill sent. Office visit required for refills.

## 2023-01-18 ENCOUNTER — Telehealth: Payer: Self-pay | Admitting: *Deleted

## 2023-01-25 NOTE — Telephone Encounter (Signed)
Opened to test MyChart to connect pt to office. Carolena Fairbank Zimmerman Rumple, CMA

## 2023-02-01 ENCOUNTER — Other Ambulatory Visit: Payer: Self-pay

## 2023-02-08 NOTE — Progress Notes (Signed)
[facility]  [address] [place],  [place]  [address] [phone]  Clinic Day:  02/10/23   Referring physician: Lorrene Reid, PA-C  CHIEF COMPLAINT:  CC:  Hormone receptor positive ductal carcinoma in situ of the right breast, as well as history of stage IV diffuse large B-cell lymphoma   Current Treatment:   Observation   HISTORY OF PRESENT ILLNESS:  Jasmine Davila is a 72 y.o. 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 Annual screening mammogram revealed an area of calcifications with in the right breast working further evaluation.  Diagnostic unilateral right mammogram in May confirmed an area of pleomorphic calcifications spanning 1.1 cm in the lower inner quadrant of the right breast.  Stereotactic biopsy revealed intermediate grade, ductal carcinoma in situ with necrosis.  Fibrocystic changes including florid and micropapillary usual ductal hyperplasia and micropapillary apocrine metaplasia were also seen.  No invasive carcinoma was identified.  Estrogen receptor was positive at 90% and progesterone receptor was positive at 95%.  She underwent lumpectomy in June.  Surgical pathology from this procedure confirmed ductal carcinoma in situ, intermediate grade.  The residual disease was only 1 mm remaining after the biopsy.  No invasive carcinoma identified and margins were free of neoplasm. We recommended chemoprevention with raloxifene, however, she declined.  She also has a history of stage IV diffuse large B-cell lymphoma diagnosed in May 2016, treated with 6 cycles of R-CHOP with a complete response.  She   has remained without evidence of recurrence. She is up to date on her colonoscopy, with the last one being in November 2020.  This is to be repeated in 5 years due to positive family history.  She had a bone density scan in June 2020, which was normal.  She has had  pancreatitis on and off since 2008.    Annual mammography from May 2022 was clear. CT imaging of the chest, abdomen and pelvis from August 2022 revealed no evidence of metastatic breast cancer or recurrence of lymphoma.   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 Patient has had right knee replacement surgery 8 months ago and complains that there is still some constant pain. Patient states that she feels well. She will be due for her annual mammogram after May, 22nd. The patient wanted me to check a small 0.5 cm in diameter black mole in the left upper posterior shoulder that rubs on her bra strap but has no bleeding. I believe it to be benign. She will keep watch on it to see if there are any changes. I will see her back in 6 months for reevaluation. She denies signs of infection such as sore throat, sinus drainage, cough, or urinary symptoms.  She denies night sweets, fevers, or recurrent chills. She denies pain. She denies nausea, vomiting, chest pain, dyspnea or cough. Her appetite is good and her weight has increased 8 pounds over last 3.5 .  REVIEW OF SYSTEMS:  Review of Systems  Constitutional: Negative.  Negative for appetite change, chills, diaphoresis, fatigue, fever and unexpected weight change.  HENT:  Negative.  Negative for hearing loss, lump/mass, mouth sores, nosebleeds, sore throat, tinnitus, trouble swallowing and voice change.   Eyes: Negative.  Negative for eye problems and icterus.  Respiratory: Negative.  Negative  for chest tightness, cough, hemoptysis, shortness of breath and wheezing.   Cardiovascular: Negative.  Negative for chest pain, leg swelling and palpitations.  Gastrointestinal: Negative.  Negative for abdominal distention, abdominal pain, blood in stool, constipation, diarrhea, nausea, rectal pain and vomiting.   Endocrine: Negative.   Genitourinary:  Negative for bladder incontinence, difficulty urinating, dyspareunia, dysuria, frequency, hematuria, menstrual problem, nocturia, pelvic pain, vaginal bleeding and vaginal discharge.   Musculoskeletal:  Positive for arthralgias (of the knees) and gait problem. Negative for back pain, flank pain, myalgias, neck pain and neck stiffness.  Skin: Negative.  Negative for itching, rash and wound.        A small 0.5cm in diameter black mole in the left upper posterior shoulder  Neurological:  Positive for gait problem. Negative for dizziness, extremity weakness, headaches, light-headedness, numbness, seizures and speech difficulty.  Hematological: Negative.  Negative for adenopathy. Does not bruise/bleed easily.  Psychiatric/Behavioral: Negative.  Negative for confusion, decreased concentration, depression, sleep disturbance and suicidal ideas. The patient is not nervous/anxious.     VITALS:  Blood pressure 115/70, pulse 71, temperature 98.7 F (37.1 C), temperature source Oral, resp. rate 20, height '5\' 7"'$  (1.702 m), weight 239 lb (108.4 kg), last menstrual period 12/21/1998, SpO2 100 %.  Wt Readings from Last 3 Encounters:  02/10/23 239 lb (108.4 kg)  10/21/22 231 lb 6.4 oz (105 kg)  08/10/22 230 lb 9.6 oz (104.6 kg)    Body mass index is 37.43 kg/m.  Performance status (ECOG): 0 - Asymptomatic  PHYSICAL EXAM:  Physical Exam Vitals and nursing note reviewed.  Constitutional:      General: She is not in acute distress.    Appearance: Normal appearance. She is normal weight. She is not ill-appearing, toxic-appearing or diaphoretic.  HENT:     Head: Normocephalic and atraumatic.     Right Ear: Tympanic membrane, ear canal and external ear normal. There is no impacted cerumen.     Left Ear: Tympanic membrane, ear canal and external ear normal. There is no impacted cerumen.     Nose: Nose normal. No congestion or rhinorrhea.     Mouth/Throat:     Mouth:  Mucous membranes are moist.     Pharynx: Oropharynx is clear. No oropharyngeal exudate or posterior oropharyngeal erythema.  Eyes:     General: No scleral icterus.       Right eye: No discharge.        Left eye: No discharge.     Extraocular Movements: Extraocular movements intact.     Conjunctiva/sclera: Conjunctivae normal.     Pupils: Pupils are equal, round, and reactive to light.  Neck:     Vascular: No carotid bruit.  Cardiovascular:     Rate and Rhythm: Normal rate and regular rhythm.     Pulses: Normal pulses.     Heart sounds: Normal heart sounds. No murmur heard.    No friction rub. No gallop.  Pulmonary:     Effort: Pulmonary effort is normal. No respiratory distress.     Breath sounds: Normal breath sounds. No stridor. No wheezing, rhonchi or rales.  Chest:     Chest wall: No tenderness.     Comments: Faint scar in the lower inner quadrant of the right breast. Both breasts are without masses. Minor fibrocystic changes of the right breast.  Abdominal:     General: Bowel sounds are normal. There is no distension.     Palpations: Abdomen is soft. There is no hepatomegaly, splenomegaly or mass.  Tenderness: There is no abdominal tenderness. There is no right CVA tenderness, left CVA tenderness, guarding or rebound.     Hernia: No hernia is present.  Musculoskeletal:        General: No swelling, tenderness, deformity or signs of injury. Normal range of motion.     Cervical back: Normal range of motion and neck supple. No rigidity or tenderness.     Right lower leg: No edema.     Left lower leg: No edema.  Lymphadenopathy:     Cervical: No cervical adenopathy.  Skin:    General: Skin is warm and dry.     Coloration: Skin is not jaundiced or pale.     Findings: No bruising, erythema, lesion or rash.  Neurological:     General: No focal deficit present.     Mental Status: She is alert and oriented to person, place, and time. Mental status is at baseline.  Psychiatric:         Mood and Affect: Mood normal.        Behavior: Behavior normal.        Thought Content: Thought content normal.        Judgment: Judgment normal.    LABS:      Latest Ref Rng & Units 02/10/2023   11:07 AM 10/21/2022   10:52 AM 08/10/2022   12:00 AM  CBC  WBC 4.0 - 10.5 K/uL 4.9  5.3  4.4      Hemoglobin 12.0 - 15.0 g/dL 13.5  14.1  12.7      Hematocrit 36.0 - 46.0 % 40.6  40.7  38      Platelets 150 - 400 K/uL 153  181  155         This result is from an external source.      Latest Ref Rng & Units 02/10/2023   11:07 AM 10/21/2022   10:52 AM 08/10/2022   12:00 AM  CMP  Glucose 70 - 99 mg/dL 139  129    BUN 8 - 23 mg/dL '12  13  10      '$ Creatinine 0.44 - 1.00 mg/dL 0.79  0.81  0.6      Sodium 135 - 145 mmol/L 134  136  136      Potassium 3.5 - 5.1 mmol/L 3.8  4.4  4.0      Chloride 98 - 111 mmol/L 97  96  100      CO2 22 - 32 mmol/L '29  27  31      '$ Calcium 8.9 - 10.3 mg/dL 9.5  10.3  9.6      Total Protein 6.5 - 8.1 g/dL 7.4  7.8    Total Bilirubin 0.3 - 1.2 mg/dL 0.7  0.5    Alkaline Phos 38 - 126 U/L 60  94  77      AST 15 - 41 U/L 26  21  38      ALT 0 - 44 U/L '21  17  26         '$ This result is from an external source.    Lab Results  Component Value Date   ALBUMINELP 4.1 08/30/2018   A1GS 0.3 08/30/2018   A2GS 0.8 08/30/2018   BETS 0.5 08/30/2018   BETA2SER 0.4 08/30/2018   GAMS 1.5 08/30/2018   SPEI  08/30/2018     Comment:     Normal Serum Protein Electrophoresis Pattern. No abnormal protein bands (M-protein) detected.    No  results found for: "TIBC", "FERRITIN", "IRONPCTSAT" Lab Results  Component Value Date   LDH 175 02/10/2023   LDH 174 08/10/2022   Component Ref Range & Units 3 mo ago (10/21/22)  Vitamin B6 3.4 - 65.2 ug/L 57.6   Component Ref Range & Units 3 mo ago (10/21/22)  Vitamin B-12 232 - 1,245 pg/mL 691   Component Ref Range & Units 3 mo ago 10/21/22  Vit D, 25-Hydroxy 30.0 - 100.0 ng/mL 62.4     Component Ref Range & Units 3  mo ago (10/21/22) 10 mo ago (03/20/22)  Cholesterol, Total 100 - 199 mg/dL 185 155  Triglycerides 0 - 149 mg/dL 109 68  HDL >39 mg/dL 75 68  VLDL Cholesterol Cal 5 - 40 mg/dL 19 13  LDL Chol Calc (NIH) 0 - 99 mg/dL 91 74  Chol/HDL Ratio 0.0 - 4.4 ratio 2.5 2.3 CM   Component Ref Range & Units 3 mo ago (10/21/22)  TSH 0.450 - 4.500 uIU/mL 1.540      Component Ref Range & Units 3 mo ago (10/21/22) 8 mo ago (05/28/22) 1 yr ago (01/28/22)  Hemoglobin A1C   6.1 High  R, CM 5.9 High  R, CM  HbA1c POC (<> result, manual entry) 4.0 - 5.6 % 5.8        STUDIES:  EXAM: 07/16/2022 CT ABDOMEN AND PELVIS WITH CONTRAST Impression: 1. Mild to moderate acute diverticulitis of the proximal sigmoid colon, with inflamed diverticulum and surrounding mesenteric edema but no extraluminal gas or abscess. Trace free pelvic fluid. 2. Chronic intrahepatic and extrahepatic biliary dilatation, no change from prior exam. 3. Subtle subcapsular hypodensity anteriorly in the right hepatic lobe, given the indistinct nature of this is probably from mild focal steatosis. 4. Other imaging findings of potential clinical significance: Aortic Atherosclerosis (ICD10-I70.0). Prominent bilateral foraminal impingement at L5-S1 due to spurring. Mild cardiomegaly. Small type 1 hiatal hernia. Stable left lower lobe scarring.    HISTORY:   Allergies:  Allergies  Allergen Reactions   Doxycycline Other (See Comments)    Pancreatic issues   Hydromorphone Other (See Comments)   Metronidazole Other (See Comments)    Cream  Burned face   Dilaudid [Hydromorphone Hcl] Anxiety    Current Medications: Current Outpatient Medications  Medication Sig Dispense Refill   aspirin EC 81 MG tablet Take 1 tablet (81 mg total) by mouth 2 (two) times daily. To be taken after surgery to prevent blood clots 84 tablet 0   carboxymethylcellulose (REFRESH TEARS) 0.5 % SOLN Place 1 drop into both eyes 3 (three) times daily as needed (dry  eyes).     cetirizine (ALLERGY, CETIRIZINE,) 10 MG tablet Take 10 mg by mouth daily.     Continuous Blood Gluc Receiver (DEXCOM G6 RECEIVER) DEVI Use to check blood sugars every morning fasting and 2 hours after largest meal 1 each 0   Continuous Blood Gluc Sensor (DEXCOM G6 SENSOR) MISC Apply new sensor every 10 days.  Use to check blood sugars every morning fasting and 2 hours after largest meal 9 each 1   Continuous Blood Gluc Transmit (DEXCOM G6 TRANSMITTER) MISC Use to check blood sugars every morning fasting and 2 hours after largest meal 1 each 0   famotidine (PEPCID) 20 MG tablet TAKE 1 TABLET BY MOUTH TWICE  DAILY 200 tablet 2   fluticasone (FLONASE) 50 MCG/ACT nasal spray Place 1 spray into both nostrils daily as needed for allergies or rhinitis. 16 g 5   folic acid (FOLVITE) [id number]  MCG tablet Take 400 mcg by mouth daily.     gabapentin (NEURONTIN) 600 MG tablet TAKE 2 TABLETS BY MOUTH 3 TIMES  DAILY 540 tablet 3   insulin lispro (HUMALOG KWIKPEN) 100 UNIT/ML KwikPen Inject 20 Units into the skin 3 (three) times daily. INJECT 20 UNITS 3 TIMES A DAY WITH MEALS 54 mL 1   LANTUS SOLOSTAR 100 UNIT/ML Solostar Pen INJECT 70 UNITS INTO THE SKIN DAILY. NEED OFFICE VISIT FOR FURTHER REFILLS 15 mL 0   losartan (COZAAR) 25 MG tablet Take 1 tablet (25 mg total) by mouth daily. 90 tablet 0   Multiple Vitamin (MULTIVITAMIN) tablet Take 1 tablet by mouth daily.     NIACIN PO Take 1,000 mg by mouth at bedtime. Take 1 hour after the 81 mg aspirin     PIP PEN NEEDLES 31G X 5MM 31G X 5 MM MISC USE AS DIRECTED WITH INSULIN PENS (4 PER DAY)     No current facility-administered medications for this visit.     ASSESSMENT & PLAN:   Assessment:   1.  Stage 0 ductal carcinoma in situ, treated with lumpectomy. We offered chemoprevention with raloxifene for a total of 5 years, but she declined.  She remains without evidence of disease, nearly 3 years postop.   2.  History of stage IV diffuse large B-cell  lymphoma diagnosed in May 2016, treated with 6 cycles of R-CHOP chemotherapy with a complete response.  Most recent imaging from August 2022 reveals no evidence of lymphoma recurrence. As she is over 5 years, we will not plan to repeat imaging unless otherwise indicated.   3.  Mediastinal node vs thyroid nodule, which has been stable. The radiologist feels it is more likely a thyroid nodule.    4. Small black mole in her left posterior upper shoulder which appears benign and measures 0.5cm. I offered to have the dermatologist to evaluate but I do feel this is benign, so we have decided to continue observation.  Plan:  I offered to have the dermatologist to evaluate the mole, but I do feel this is benign so we have decided to continue observation. She will be due for her annual mammogram after May, 22nd. Today's labs are pending and I will call her with the results. I will see her back in 6 months for reevaluation. She verbalizes understanding of and agreement to the plans discussed today. She knows to call the office should any new questions or concerns arise.    I provided 10 minutes of face-to-face time during this this encounter and > 50% was spent counseling as documented under my assessment and plan.    I,Jasmine M Lassiter,acting as a scribe for [name], MD.,have documented all relevant documentation on the behalf of [name], MD,as directed by  [name], MD while in the presence of [name], MD.

## 2023-02-10 ENCOUNTER — Inpatient Hospital Stay: Payer: Medicare Other | Attending: Oncology | Admitting: Oncology

## 2023-02-10 ENCOUNTER — Inpatient Hospital Stay: Payer: Medicare Other

## 2023-02-10 ENCOUNTER — Other Ambulatory Visit: Payer: Self-pay | Admitting: Oncology

## 2023-02-10 ENCOUNTER — Encounter: Payer: Self-pay | Admitting: Oncology

## 2023-02-10 VITALS — BP 115/70 | HR 71 | Temp 98.7°F | Resp 20 | Ht 67.0 in | Wt 239.0 lb

## 2023-02-10 DIAGNOSIS — D0511 Intraductal carcinoma in situ of right breast: Secondary | ICD-10-CM

## 2023-02-10 DIAGNOSIS — M2550 Pain in unspecified joint: Secondary | ICD-10-CM | POA: Diagnosis not present

## 2023-02-10 DIAGNOSIS — I700 Atherosclerosis of aorta: Secondary | ICD-10-CM | POA: Diagnosis not present

## 2023-02-10 DIAGNOSIS — I517 Cardiomegaly: Secondary | ICD-10-CM | POA: Insufficient documentation

## 2023-02-10 DIAGNOSIS — K449 Diaphragmatic hernia without obstruction or gangrene: Secondary | ICD-10-CM | POA: Insufficient documentation

## 2023-02-10 DIAGNOSIS — C801 Malignant (primary) neoplasm, unspecified: Secondary | ICD-10-CM | POA: Diagnosis not present

## 2023-02-10 DIAGNOSIS — Z79899 Other long term (current) drug therapy: Secondary | ICD-10-CM | POA: Insufficient documentation

## 2023-02-10 DIAGNOSIS — Z8572 Personal history of non-Hodgkin lymphomas: Secondary | ICD-10-CM | POA: Diagnosis not present

## 2023-02-10 DIAGNOSIS — Z853 Personal history of malignant neoplasm of breast: Secondary | ICD-10-CM | POA: Diagnosis present

## 2023-02-10 LAB — CBC WITH DIFFERENTIAL (CANCER CENTER ONLY)
Abs Immature Granulocytes: 0.01 10*3/uL (ref 0.00–0.07)
Basophils Absolute: 0 10*3/uL (ref 0.0–0.1)
Basophils Relative: 1 %
Eosinophils Absolute: 0.1 10*3/uL (ref 0.0–0.5)
Eosinophils Relative: 2 %
HCT: 40.6 % (ref 36.0–46.0)
Hemoglobin: 13.5 g/dL (ref 12.0–15.0)
Immature Granulocytes: 0 %
Lymphocytes Relative: 29 %
Lymphs Abs: 1.4 10*3/uL (ref 0.7–4.0)
MCH: 28.8 pg (ref 26.0–34.0)
MCHC: 33.3 g/dL (ref 30.0–36.0)
MCV: 86.6 fL (ref 80.0–100.0)
Monocytes Absolute: 0.3 10*3/uL (ref 0.1–1.0)
Monocytes Relative: 6 %
Neutro Abs: 3 10*3/uL (ref 1.7–7.7)
Neutrophils Relative %: 62 %
Platelet Count: 153 10*3/uL (ref 150–400)
RBC: 4.69 MIL/uL (ref 3.87–5.11)
RDW: 12.9 % (ref 11.5–15.5)
WBC Count: 4.9 10*3/uL (ref 4.0–10.5)
nRBC: 0 % (ref 0.0–0.2)

## 2023-02-10 LAB — CMP (CANCER CENTER ONLY)
ALT: 21 U/L (ref 0–44)
AST: 26 U/L (ref 15–41)
Albumin: 4.2 g/dL (ref 3.5–5.0)
Alkaline Phosphatase: 60 U/L (ref 38–126)
Anion gap: 8 (ref 5–15)
BUN: 12 mg/dL (ref 8–23)
CO2: 29 mmol/L (ref 22–32)
Calcium: 9.5 mg/dL (ref 8.9–10.3)
Chloride: 97 mmol/L — ABNORMAL LOW (ref 98–111)
Creatinine: 0.79 mg/dL (ref 0.44–1.00)
GFR, Estimated: >60 mL/min (ref >60)
Glucose, Bld: 139 mg/dL — ABNORMAL HIGH (ref 70–99)
Potassium: 3.8 mmol/L (ref 3.5–5.1)
Sodium: 134 mmol/L — ABNORMAL LOW (ref 135–145)
Total Bilirubin: 0.7 mg/dL (ref 0.3–1.2)
Total Protein: 7.4 g/dL (ref 6.5–8.1)

## 2023-02-10 LAB — LACTATE DEHYDROGENASE: LDH: 175 U/L (ref 98–192)

## 2023-02-12 ENCOUNTER — Other Ambulatory Visit: Payer: Self-pay | Admitting: Nurse Practitioner

## 2023-02-12 DIAGNOSIS — Z794 Long term (current) use of insulin: Secondary | ICD-10-CM

## 2023-02-16 ENCOUNTER — Telehealth: Payer: Self-pay

## 2023-02-16 NOTE — Telephone Encounter (Signed)
-----   Message from [name], MD sent at 02/15/2023  2:23 PM EST ----- Regarding: call Tell her salt level a little and BS 139 (not bad for non-fasting), rest all normal

## 2023-02-16 NOTE — Telephone Encounter (Signed)
Patient notified

## 2023-03-03 ENCOUNTER — Encounter: Payer: Self-pay | Admitting: Orthopaedic Surgery

## 2023-03-03 ENCOUNTER — Ambulatory Visit: Payer: Medicare Other | Admitting: Orthopaedic Surgery

## 2023-03-03 DIAGNOSIS — Z96651 Presence of right artificial knee joint: Secondary | ICD-10-CM | POA: Diagnosis not present

## 2023-03-03 NOTE — Progress Notes (Signed)
Office Visit Note   Patient: Jasmine Davila           Date of Birth: 09-30-1951           MRN: [id number] Visit Date: 03/03/2023              Requested by: Jasmine Reid, PA-C No address on file PCP: Jasmine Reid, PA-C (Inactive)   Assessment & Plan: Visit Diagnoses:  1. Status post total right knee replacement     Plan: Jasmine Davila is now 9 months status post right total knee replacement.  She has done very well and overall very pleased with the outcome.  She has had significant improvement in quality of life and with performing ADLs.  Dental prophylaxis reinforced.  Recheck in 6 months with two-view x-rays of the right knee.  Follow-Up Instructions: Return in about 6 months (around 09/03/2023).   Orders:  No orders of the defined types were placed in this encounter.  No orders of the defined types were placed in this encounter.     Procedures: No procedures performed   Clinical Data: No additional findings.   Subjective: Chief Complaint  Patient presents with   Right Knee - Follow-up    Right total knee arthroplasty 06/08/2022    HPI  Jasmine Davila is 9 months status post right total knee replacement.  She is doing well overall.  The lateral knee pain has resolved.  She has no complaints today.  Review of Systems   Objective: Vital Signs: LMP 12/21/1998 (Approximate)   Physical Exam  Ortho Exam  Examination right knee shows full healed surgical scar.  Functional range of motion.  Good varus valgus stability.  Specialty Comments:  No specialty comments available.  Imaging: No results found.   PMFS History: Patient Active Problem List   Diagnosis Date Noted   Status post total right knee replacement 06/08/2022   Primary osteoarthritis of right knee 06/07/2022   Ductal carcinoma in situ (DCIS) of right breast 06/14/2020   Acute pancreatitis 06/21/2019   Pancreatitis 06/21/2019   Acute diverticulitis 06/21/2019   Abnormal LFTs  06/21/2019   Hyperbilirubinemia 06/21/2019   Left leg swelling 06/21/2019   Hyponatremia 06/21/2019   Hypochloremia 06/21/2019   Obesity 06/21/2019   Vitamin D insufficiency 04/11/2019   Lumbar spondylosis 02/28/2019   Chronic back pain 02/07/2019   Inflamed acrochordon 02/07/2019   Acrochordon- over R eye interferring with vision 02/07/2019   Acute otitis media 11/23/2018   Acute maxillary sinusitis 11/23/2018   History of malignant lymphoma 06/03/2018   Neuropathy 06/03/2018   Endometrial disorder- trace endometrial canal fluid seen on CT scan recently and in 2016.  No GYN care many yrs, to GYN for TVUS and further w/up prn 04/20/2018   Diabetic peripheral neuropathy associated with type 2 diabetes mellitus ([facility]) 04/20/2018   Acute on chronic pancreatitis ([facility]) 04/13/2018   Onychomycosis 03/16/2018   Neuropathic pain syndrome (non-herpetic) 03/16/2018   Sleep apnea with CPAP 12/29/2016   History of decreased platelet count 12/29/2016   Morbid obesity ([facility]) 11/25/2016   Diabetes mellitus due to underlying condition with diabetic nephropathy, with long-term current use of insulin ([facility]) 11/25/2016   Smoking hx 11/24/2016   Hypertension associated with diabetes ([facility]) 11/24/2016   Chronic pancreatitis ([facility]) 11/24/2016   H/o Cancer:   B- cell lymphoma (spleen and lung) - non-Hodgkin's 11/24/2016   Diabetic neuropathy - exaccerbated by chemo 11/24/2016   GERD (gastroesophageal reflux disease) 11/24/2016   Hyperlipidemia associated with type 2 diabetes mellitus (  [facility]) 11/24/2016   B12 deficiency 11/24/2016   Vitamin B6 deficiency (non anemic) 11/24/2016   Past Medical History:  Diagnosis Date   Abnormal EKG    hx of right bundle branch block   Acute pancreatitis    HX OF   Anemia yrs ago   Breast cancer ([facility]) 2021   Cancer ([facility]) dx 2016   B cell lymphoma- Non Hodgkins in remission   Diabetes mellitus ([facility])    TYPE 2   Diabetic neuropathy ([facility])    Diabetic neuropathy ([facility])     Endometrial disorder    GERD (gastroesophageal reflux disease)    Headache    tension or sinus   Hypertension    Malignant lymphoma ([facility])    Morbid obesity ([facility])    Peripheral neuropathy    BOTH FEET, SLIGHT NEUROPATHY IN HANDS   Sleep apnea    USES CPAP at times   Vitamin B 12 deficiency     Family History  Problem Relation Age of Onset   Cancer Mother 65       colon   Hypertension Mother    Osteoporosis Mother    Heart attack Father    Heart disease Sister    Alcohol abuse Maternal Grandfather     Past Surgical History:  Procedure Laterality Date   BREAST LUMPECTOMY Right 2021   CHOLECYSTECTOMY  03/06/1996   colomscopy     DILATATION & CURETTAGE/HYSTEROSCOPY WITH MYOSURE N/A 07/04/2018   Procedure: DILATATION & CURETTAGE/HYSTEROSCOPY WITH MYOSURE;  Surgeon: Salvadore Dom, MD;  Location: [facility];  Service: Gynecology;  Laterality: N/A;   MEDIPORT INSERTION, DOUBLE     MEDIPORT REMOVAL     TOTAL KNEE ARTHROPLASTY Right 06/08/2022   Procedure: RIGHT TOTAL KNEE REPLACEMENT;  Surgeon: Leandrew Koyanagi, MD;  Location: [facility];  Service: Orthopedics;  Laterality: Right;   Social History   Occupational History   Occupation: retired from medical billing  Tobacco Use   Smoking status: Former    Packs/day: 0.50    Years: 4.00    Total pack years: 2.00    Types: Cigarettes    Quit date: 12/21/1968    Years since quitting: 54.2   Smokeless tobacco: Never   Tobacco comments:    as teenager  Vaping Use   Vaping Use: Never used  Substance and Sexual Activity   Alcohol use: No   Drug use: No   Sexual activity: Not Currently    Birth control/protection: None

## 2023-03-08 ENCOUNTER — Other Ambulatory Visit: Payer: Self-pay | Admitting: Nurse Practitioner

## 2023-03-08 DIAGNOSIS — E1159 Type 2 diabetes mellitus with other circulatory complications: Secondary | ICD-10-CM

## 2023-03-09 ENCOUNTER — Ambulatory Visit (INDEPENDENT_AMBULATORY_CARE_PROVIDER_SITE_OTHER): Payer: Medicare Other | Admitting: Family Medicine

## 2023-03-09 ENCOUNTER — Encounter: Payer: Self-pay | Admitting: Family Medicine

## 2023-03-09 VITALS — BP 122/62 | HR 64 | Ht 67.0 in | Wt 234.0 lb

## 2023-03-09 DIAGNOSIS — Z1211 Encounter for screening for malignant neoplasm of colon: Secondary | ICD-10-CM

## 2023-03-09 DIAGNOSIS — Z0000 Encounter for general adult medical examination without abnormal findings: Secondary | ICD-10-CM

## 2023-03-09 DIAGNOSIS — E1142 Type 2 diabetes mellitus with diabetic polyneuropathy: Secondary | ICD-10-CM | POA: Diagnosis not present

## 2023-03-09 DIAGNOSIS — E0821 Diabetes mellitus due to underlying condition with diabetic nephropathy: Secondary | ICD-10-CM

## 2023-03-09 MED ORDER — LANTUS SOLOSTAR 100 UNIT/ML ~~LOC~~ SOPN: PEN_INJECTOR | SUBCUTANEOUS | 3 refills | Status: DC

## 2023-03-09 MED ORDER — INSULIN LISPRO (1 UNIT DIAL) 100 UNIT/ML (KWIKPEN): 20.0000 [IU] | PEN_INJECTOR | Freq: Three times a day (TID) | SUBCUTANEOUS | 1 refills | Status: DC

## 2023-03-09 NOTE — Progress Notes (Signed)
Subjective:   Jasmine Davila is a 72 y.o. female who presents for Medicare Annual (Subsequent) preventive examination.  Review of Systems    Review of Systems  All other systems reviewed and are negative.         Objective:    Today's Vitals   03/09/23 1310 03/09/23 1311  BP: 122/62   Pulse: 64   Weight: 234 lb (106.1 kg)   Height: 5\' 4"  (1.626 m)   PainSc: 5  5    Body mass index is 40.17 kg/m.     02/10/2023   11:34 AM 08/10/2022   11:39 AM 07/16/2022   11:21 AM 06/08/2022    4:18 PM 05/28/2022   10:27 AM 06/21/2019    4:10 PM 06/21/2019    4:05 AM  Advanced Directives  Does Patient Have a Medical Advance Directive? Yes Yes Yes Yes Yes No No  Type of Advance Directive Living will   Living will;Healthcare Power of Attorney Living will;Healthcare Power of Attorney    Does patient want to make changes to medical advance directive?    No - Patient declined No - Patient declined    Copy of [place] in Chart?    No - copy requested No - copy requested    Would patient like information on creating a medical advance directive?      No - Patient declined     Current Medications (verified) Outpatient Encounter Medications as of 03/09/2023  Medication Sig   aspirin EC 81 MG tablet Take 1 tablet (81 mg total) by mouth 2 (two) times daily. To be taken after surgery to prevent blood clots (Patient taking differently: Take 81 mg by mouth once. To be taken after surgery to prevent blood clots)   carboxymethylcellulose (REFRESH TEARS) 0.5 % SOLN Place 1 drop into both eyes 3 (three) times daily as needed (dry eyes).   cetirizine (ALLERGY, CETIRIZINE,) 10 MG tablet Take 10 mg by mouth daily.   Continuous Blood Gluc Receiver (DEXCOM G6 RECEIVER) DEVI Use to check blood sugars every morning fasting and 2 hours after largest meal   Continuous Blood Gluc Sensor (DEXCOM G6 SENSOR) MISC Apply new sensor every 10 days.  Use to check blood sugars every morning fasting and  2 hours after largest meal   Continuous Blood Gluc Transmit (DEXCOM G6 TRANSMITTER) MISC Use to check blood sugars every morning fasting and 2 hours after largest meal   famotidine (PEPCID) 20 MG tablet TAKE 1 TABLET BY MOUTH TWICE  DAILY   fluticasone (FLONASE) 50 MCG/ACT nasal spray Place 1 spray into both nostrils daily as needed for allergies or rhinitis.   folic acid (FOLVITE) A999333 MCG tablet Take 400 mcg by mouth daily.   gabapentin (NEURONTIN) 600 MG tablet TAKE 2 TABLETS BY MOUTH 3 TIMES  DAILY   losartan (COZAAR) 25 MG tablet TAKE 1 TABLET BY MOUTH DAILY   Multiple Vitamin (MULTIVITAMIN) tablet Take 1 tablet by mouth daily.   NIACIN PO Take 1,000 mg by mouth at bedtime. Take 1 hour after the 81 mg aspirin   PIP PEN NEEDLES 31G X 5MM 31G X 5 MM MISC USE AS DIRECTED WITH INSULIN PENS (4 PER DAY)   [DISCONTINUED] insulin lispro (HUMALOG KWIKPEN) 100 UNIT/ML KwikPen Inject 20 Units into the skin 3 (three) times daily. INJECT 20 UNITS 3 TIMES A DAY WITH MEALS   [DISCONTINUED] LANTUS SOLOSTAR 100 UNIT/ML Solostar Pen INJECT 70 UNITS INTO THE SKIN DAILY. NEED OFFICE VISIT FOR  FURTHER REFILLS   insulin glargine (LANTUS SOLOSTAR) 100 UNIT/ML Solostar Pen INJECT 70 UNITS INTO THE SKIN DAILY. NEED OFFICE VISIT FOR FURTHER REFILLS   insulin lispro (HUMALOG KWIKPEN) 100 UNIT/ML KwikPen Inject 20 Units into the skin 3 (three) times daily. INJECT 20 UNITS 3 TIMES A DAY WITH MEALS   No facility-administered encounter medications on file as of 03/09/2023.    Allergies (verified) Doxycycline, Hydromorphone, Metronidazole, and Dilaudid [hydromorphone hcl]   History: Past Medical History:  Diagnosis Date   Abnormal EKG    hx of right bundle branch block   Acute pancreatitis    HX OF   Anemia yrs ago   Breast cancer ([place]) 2021   Cancer ([place]) dx 2016   B cell lymphoma- Non Hodgkins in remission   Diabetes mellitus ([place])    TYPE 2   Diabetic neuropathy (HCC)    Diabetic neuropathy (HCC)     Endometrial disorder    GERD (gastroesophageal reflux disease)    Headache    tension or sinus   Hypertension    Malignant lymphoma ([place])    Morbid obesity ([place])    Peripheral neuropathy    BOTH FEET, SLIGHT NEUROPATHY IN HANDS   Sleep apnea    USES CPAP at times   Vitamin B 12 deficiency    Past Surgical History:  Procedure Laterality Date   BREAST LUMPECTOMY Right 2021   CHOLECYSTECTOMY  03/06/1996   colomscopy     DILATATION & CURETTAGE/HYSTEROSCOPY WITH MYOSURE N/A 07/04/2018   Procedure: DILATATION & CURETTAGE/HYSTEROSCOPY WITH MYOSURE;  Surgeon: Salvadore Dom, MD;  Location: [facility];  Service: Gynecology;  Laterality: N/A;   MEDIPORT INSERTION, DOUBLE     MEDIPORT REMOVAL     TOTAL KNEE ARTHROPLASTY Right 06/08/2022   Procedure: RIGHT TOTAL KNEE REPLACEMENT;  Surgeon: Leandrew Koyanagi, MD;  Location: [facility];  Service: Orthopedics;  Laterality: Right;   Family History  Problem Relation Age of Onset   Cancer Mother 46       colon   Hypertension Mother    Osteoporosis Mother    Heart attack Father    Heart disease Sister    Alcohol abuse Maternal Grandfather    Social History   Socioeconomic History   Marital status: Married    Spouse name: Dorena Bodo"   Number of children: 4   Years of education: 12   Highest education level: Not on file  Occupational History   Occupation: retired from medical billing  Tobacco Use   Smoking status: Former    Packs/day: 0.50    Years: 4.00    Additional pack years: 0.00    Total pack years: 2.00    Types: Cigarettes    Quit date: 12/21/1968    Years since quitting: 54.2   Smokeless tobacco: Never   Tobacco comments:    as teenager  Vaping Use   Vaping Use: Never used  Substance and Sexual Activity   Alcohol use: No   Drug use: No   Sexual activity: Not Currently    Birth control/protection: None  Other Topics Concern   Not on file  Social History Narrative   Lives with husband in a one story  home.  Has 3 living children.  One child died in a MVA.  Retired from [employer].     Education: high school.   Social Determinants of Health   Financial Resource Strain: Low Risk  (03/09/2023)   Overall Financial Resource Strain (CARDIA)  Difficulty of Paying Living Expenses: Not hard at all  Food Insecurity: No Food Insecurity (03/09/2023)   Hunger Vital Sign    Worried About Running Out of Food in the Last Year: Never true    Ran Out of Food in the Last Year: Never true  Transportation Needs: No Transportation Needs (03/09/2023)   PRAPARE - Hydrologist (Medical): No    Lack of Transportation (Non-Medical): No  Physical Activity: Insufficiently Active (03/09/2023)   Exercise Vital Sign    Days of Exercise per Week: 1 day    Minutes of Exercise per Session: 10 min  Stress: No Stress Concern Present (03/09/2023)   [place]    Feeling of Stress : Not at all  Social Connections: Moderately Integrated (03/09/2023)   Social Connection and Isolation Panel [NHANES]    Frequency of Communication with Friends and Family: More than three times a week    Frequency of Social Gatherings with Friends and Family: More than three times a week    Attends Religious Services: 1 to 4 times per year    Active Member of [employer] or Organizations: No    Attends Music therapist: Never    Marital Status: Married    Tobacco Counseling Counseling given: Not Answered Tobacco comments: as teenager   Clinical Intake:  Pre-visit preparation completed: Yes  Pain : 0-10 Pain Score: 5  Pain Type: Neuropathic pain Pain Descriptors / Indicators: Burning, Stabbing, Constant Pain Onset: More than a month ago Pain Frequency: Constant     BMI - recorded: 40.17 Nutritional Status: BMI > 30  Obese Nutritional Risks: None Diabetes: Yes CBG done?: No Did pt. bring in CBG monitor from home?:  No  How often do you need to have someone help you when you read instructions, pamphlets, or other written materials from your doctor or pharmacy?: 1 - Never  Diabetic?Yes  Interpreter Needed?: No  Information entered by :: Leanord Asal, RMA   Activities of Daily Living    03/09/2023   11:09 AM 03/09/2023   12:01 AM  In your present state of health, do you have any difficulty performing the following activities:  Hearing? 0 0  Vision? 0 0  Difficulty concentrating or making decisions? 0 0  Walking or climbing stairs? 0 0  Dressing or bathing? 0 0  Doing errands, shopping? 0 0  Preparing Food and eating ? N N  Using the Toilet? N N  In the past six months, have you accidently leaked urine? Y Y  Do you have problems with loss of bowel control? N N  Managing your Medications? N N  Managing your Finances? N N  Housekeeping or managing your Housekeeping? N N    Patient Care Team: Velva Harman, PA as PCP - General (Family Medicine) [name], MD as Consulting Physician (Oncology) Clarene Essex, MD as Consulting Physician (Gastroenterology) Alda Berthold, DO as Consulting Physician (Neurology) Edrick Kins, DPM as Consulting Physician (Podiatry) Care, [place] [facility] (Family Medicine) Arta Silence, MD as Consulting Physician (Gastroenterology) Mont Dutton, Murray Hodgkins, MD as Referring Physician (Gastroenterology)  Indicate any recent Medical Services you may have received from other than [facility] providers in the past year (date may be approximate).     Assessment:   This is a routine wellness examination for [place].  Hearing/Vision screen No results found.  Dietary issues and exercise activities discussed: Current Exercise Habits: Home exercise  routine, Type of exercise: walking, Time (Minutes): 10, Frequency (Times/Week): 1, Weekly Exercise (Minutes/Week): 10, Intensity: Moderate, Exercise limited by: None identified   Goals Addressed    None   Depression Screen    03/09/2023    1:04 PM 10/21/2022   10:04 AM 07/21/2022   10:02 AM 03/20/2022   10:57 AM 10/07/2021   10:32 AM 07/07/2021   10:36 AM 02/19/2021    8:29 AM  PHQ 2/9 Scores  PHQ - 2 Score 0 0 0 0 0 0 0  PHQ- 9 Score  1 2 0 0 1 0    Fall Risk    03/09/2023   12:01 AM 10/21/2022   10:05 AM 07/21/2022   10:02 AM 03/20/2022   10:56 AM 10/07/2021   10:31 AM  Fall Risk   Falls in the past year? 1 1 1  0 1  Number falls in past yr: 0 1 0 0 0  Injury with Fall? 0 0 0 0 0  Risk for fall due to :   No Fall Risks No Fall Risks History of fall(s)  Follow up   Falls evaluation completed Falls evaluation completed Falls evaluation completed    FALL RISK PREVENTION PERTAINING TO THE HOME:  Any stairs in or around the home? Yes  If so, are there any without handrails? No  Home free of loose throw rugs in walkways, pet beds, electrical cords, etc? Yes  Adequate lighting in your home to reduce risk of falls? Yes   ASSISTIVE DEVICES UTILIZED TO PREVENT FALLS:  Life alert? Yes  Use of a cane, walker or w/c? No  Grab bars in the bathroom? Yes  Shower chair or bench in shower? Yes  Elevated toilet seat or a handicapped toilet? Yes   TIMED UP AND GO:  Was the test performed? No .  Tele-visit  Cognitive Function:        03/09/2023    1:05 PM 01/07/2022   10:17 AM 01/15/2021    9:48 AM 04/25/2019    2:30 PM  6CIT Screen  What Year? 0 points 0 points 0 points 0 points  What month? 0 points 0 points 0 points 0 points  What time? 0 points 0 points 0 points 0 points  Count back from 20 0 points 0 points 0 points 0 points  Months in reverse 0 points 0 points 0 points 0 points  Repeat phrase 0 points 0 points 0 points 0 points  Total Score 0 points 0 points 0 points 0 points    Immunizations Immunization History  Administered Date(s) Administered   Fluad Quad(high Dose 65+) 09/21/2019, 10/24/2020, 10/07/2021, 10/21/2022   Influenza, High Dose Seasonal PF 11/04/2018    Influenza,inj,Quad PF,6+ Mos 11/24/2016   Influenza-Unspecified 11/24/2016, 08/31/2017   Moderna Sars-Covid-2 Vaccination 02/03/2020, 03/01/2020   Pneumococcal Conjugate-13 09/21/2015    TDAP status: Due, Education has been provided regarding the importance of this vaccine. Advised may receive this vaccine at local pharmacy or Health Dept. Aware to provide a copy of the vaccination record if obtained from local pharmacy or Health Dept. Verbalized acceptance and understanding.  Flu Vaccine status: Up to date  Pneumococcal vaccine status: Due, Education has been provided regarding the importance of this vaccine. Advised may receive this vaccine at local pharmacy or Health Dept. Aware to provide a copy of the vaccination record if obtained from local pharmacy or Health Dept. Verbalized acceptance and understanding.  Covid-19 vaccine status: Information provided on how to obtain vaccines.   Qualifies  for Shingles Vaccine? Yes   Zostavax completed No   Shingrix Completed?: No.    Education has been provided regarding the importance of this vaccine. Patient has been advised to call insurance company to determine out of pocket expense if they have not yet received this vaccine. Advised may also receive vaccine at local pharmacy or Health Dept. Verbalized acceptance and understanding.  Screening Tests Health Maintenance  Topic Date Due   Hepatitis C Screening  Never done   DTaP/Tdap/Td (1 - Tdap) Never done   Pneumonia Vaccine 88+ Years old (2 of 2 - PPSV23 or PCV20) 11/16/2015   COVID-19 Vaccine (3 - Moderna risk series) 03/29/2020   OPHTHALMOLOGY EXAM  03/27/2021   Diabetic kidney evaluation - Urine ACR  07/07/2022   FOOT EXAM  07/07/2022   Zoster Vaccines- Shingrix (1 of 2) 06/09/2023 (Originally 12/15/1970)   HEMOGLOBIN A1C  04/21/2023   Diabetic kidney evaluation - eGFR measurement  02/11/2024   Medicare Annual Wellness (AWV)  03/08/2024   MAMMOGRAM  05/11/2024   COLONOSCOPY (Pts  45-39yrs Insurance coverage will need to be confirmed)  11/02/2029   INFLUENZA VACCINE  Completed   DEXA SCAN  Completed   HPV VACCINES  Aged Out    Health Maintenance  Health Maintenance Due  Topic Date Due   Hepatitis C Screening  Never done   DTaP/Tdap/Td (1 - Tdap) Never done   Pneumonia Vaccine 18+ Years old (2 of 2 - PPSV23 or PCV20) 11/16/2015   COVID-19 Vaccine (3 - Moderna risk series) 03/29/2020   OPHTHALMOLOGY EXAM  03/27/2021   Diabetic kidney evaluation - Urine ACR  07/07/2022   FOOT EXAM  07/07/2022    Colorectal cancer screening: Type of screening: Colonoscopy. Completed 11/03/2019. Repeat every 10 years  Mammogram status: Completed 05/11/2022. Repeat every year  Bone Density status: Completed 06/14/2019. Results reflect: Bone density results: NORMAL. Repeat every 10 years.  Lung Cancer Screening: (Low Dose CT Chest recommended if Age 63-80 years, 30 pack-year currently smoking OR have quit w/in 15years.) does not qualify.   Additional Screening:  Hepatitis C Screening: does not qualify.  Vision Screening: Recommended annual ophthalmology exams for early detection of glaucoma and other disorders of the eye. Is the patient up to date with their annual eye exam?  Yes  Who is the provider or what is the name of the office in which the patient attends annual eye exams? [facility]  If pt is not established with a provider, would they like to be referred to a provider to establish care?  Established .   Dental Screening: Recommended annual dental exams for proper oral hygiene  Community Resource Referral / Chronic Care Management: CRR required this visit?  No   CCM required this visit?  No      Plan:     I have personally reviewed and noted the following in the patient's chart:   Medical and social history Use of alcohol, tobacco or illicit drugs  Current medications and supplements including opioid prescriptions. Patient is not currently taking opioid  prescriptions. Functional ability and status Nutritional status Physical activity Advanced directives List of other physicians Hospitalizations, surgeries, and ER visits in previous 12 months Vitals Screenings to include cognitive, depression, and falls Referrals and appointments  In addition, I have reviewed and discussed with patient certain preventive protocols, quality metrics, and best practice recommendations. A written personalized care plan for preventive services as well as general preventive health recommendations were provided to patient.  Velva Harman, PA   03/09/2023   Nurse Notes: Telephone encounter 20 minutes.

## 2023-03-09 NOTE — Addendum Note (Signed)
Addended by: Gemma Payor on: 03/09/2023 01:47 PM   Modules accepted: Orders

## 2023-04-05 DIAGNOSIS — G4733 Obstructive sleep apnea (adult) (pediatric): Secondary | ICD-10-CM | POA: Diagnosis not present

## 2023-04-14 ENCOUNTER — Ambulatory Visit: Payer: Medicare Other | Admitting: Podiatry

## 2023-04-14 DIAGNOSIS — M79674 Pain in right toe(s): Secondary | ICD-10-CM

## 2023-04-14 DIAGNOSIS — M79675 Pain in left toe(s): Secondary | ICD-10-CM | POA: Diagnosis not present

## 2023-04-14 DIAGNOSIS — M19071 Primary osteoarthritis, right ankle and foot: Secondary | ICD-10-CM | POA: Diagnosis not present

## 2023-04-14 DIAGNOSIS — M19072 Primary osteoarthritis, left ankle and foot: Secondary | ICD-10-CM | POA: Diagnosis not present

## 2023-04-14 DIAGNOSIS — B351 Tinea unguium: Secondary | ICD-10-CM | POA: Diagnosis not present

## 2023-04-14 MED ORDER — BETAMETHASONE SOD PHOS & ACET 6 (3-3) MG/ML IJ SUSP
3.0000 mg | Freq: Once | INTRAMUSCULAR | Status: DC
Start: 2023-04-14 — End: 2023-10-13

## 2023-04-14 NOTE — Progress Notes (Signed)
   Chief Complaint  Patient presents with   Diabetes    Diabetic foot care, nail trim, A1c- 5.6 BG- 102, Neuropathy, burning tingling, and sharp shoot ing pain at night, some ankle swelling      SUBJECTIVE Patient with a history of diabetes mellitus presents to office today complaining of elongated, thickened nails that cause pain while ambulating in shoes.  Patient is unable to trim their own nails.    There is also some tenderness to palpation to the bilateral ankles as well as range of motion as well.  Patient does have a history of arthritis.  Patient is here for further evaluation and treatment.   Past Medical History:  Diagnosis Date   Abnormal EKG    hx of right bundle branch block   Acute pancreatitis    HX OF   Anemia yrs ago   Breast cancer ([facility]CC) 2021   Cancer ([facility]) dx 2016   B cell lymphoma- Non Hodgkins in remission   Diabetes mellitus (HCC)    TYPE 2   Diabetic neuropathy (HCC)    Diabetic neuropathy (HCC)    Endometrial disorder    GERD (gastroesophageal reflux disease)    Headache    tension or sinus   Hypertension    Malignant lymphoma (HCC)    Morbid obesity (HCC)    Peripheral neuropathy    BOTH FEET, SLIGHT NEUROPATHY IN HANDS   Sleep apnea    USES CPAP at times   Vitamin B 12 deficiency     OBJECTIVE General Patient is awake, alert, and oriented x 3 and in no acute distress. Derm Skin is dry and supple bilateral. Negative open lesions or macerations. Remaining integument unremarkable. Nails are tender, long, thickened and dystrophic with subungual debris, consistent with onychomycosis, 1-5 bilateral. No signs of infection noted.  Hyperkeratotic preulcerative callus lesions also noted to the bilateral feet Vasc  DP and PT pedal pulses palpable bilaterally. Temperature gradient within normal limits.  Neuro Epicritic and protective threshold sensation diminished bilaterally.  Musculoskeletal Exam No symptomatic pedal deformities noted bilateral. Muscular  strength within normal limits. There is some tenderness to palpation to the bilateral ankles with tenderness to ROM as well to the bilateral ankles  ASSESSMENT 1. Diabetes Mellitus w/ peripheral neuropathy 2.  Pain due to onychomycosis of toenails bilateral 3.  Preulcerative callus lesions bilateral feet 4.  Capsulitis/DJD bilateral ankles  PLAN OF CARE 1. Patient evaluated today. 2. Instructed to maintain good pedal hygiene and foot care. Stressed importance of controlling blood sugar.  3. Mechanical debridement of nails 1-5 bilaterally performed using a nail nipper. Filed with dremel without incident.  4.  Excisional debridement of the hyperkeratotic preulcerative callus lesions was also performed using a tissue nipper without incident or bleeding  5.  Injection of 0.5 cc Celestone Soluspan injected into the bilateral ankles 6.  Return to clinic annually    Felecia Shelling, DPM [facility]  Dr. Felecia Shelling, DPM    [address] [place], [place] [address]                Office [phone]  Fax [phone]

## 2023-04-29 DIAGNOSIS — H25812 Combined forms of age-related cataract, left eye: Secondary | ICD-10-CM | POA: Diagnosis not present

## 2023-04-29 DIAGNOSIS — H04123 Dry eye syndrome of bilateral lacrimal glands: Secondary | ICD-10-CM | POA: Diagnosis not present

## 2023-05-03 ENCOUNTER — Other Ambulatory Visit: Payer: Self-pay

## 2023-05-03 DIAGNOSIS — K219 Gastro-esophageal reflux disease without esophagitis: Secondary | ICD-10-CM

## 2023-05-03 MED ORDER — FAMOTIDINE 20 MG PO TABS
20.0000 mg | ORAL_TABLET | Freq: Two times a day (BID) | ORAL | 2 refills | Status: DC
Start: 2023-05-03 — End: 2024-01-13

## 2023-05-13 DIAGNOSIS — Z1231 Encounter for screening mammogram for malignant neoplasm of breast: Secondary | ICD-10-CM | POA: Diagnosis not present

## 2023-05-13 DIAGNOSIS — R928 Other abnormal and inconclusive findings on diagnostic imaging of breast: Secondary | ICD-10-CM | POA: Diagnosis not present

## 2023-05-13 LAB — HM MAMMOGRAPHY

## 2023-05-18 ENCOUNTER — Encounter: Payer: Self-pay | Admitting: Oncology

## 2023-05-19 ENCOUNTER — Other Ambulatory Visit: Payer: Self-pay | Admitting: Oncology

## 2023-05-19 DIAGNOSIS — D0511 Intraductal carcinoma in situ of right breast: Secondary | ICD-10-CM

## 2023-05-20 ENCOUNTER — Telehealth: Payer: Self-pay | Admitting: Oncology

## 2023-05-20 NOTE — Telephone Encounter (Signed)
05/20/23 Spoke with patient and scheduled right breast mammo/breast u/s on 06/02/23@1020am 

## 2023-05-21 DIAGNOSIS — H25812 Combined forms of age-related cataract, left eye: Secondary | ICD-10-CM | POA: Diagnosis not present

## 2023-05-26 ENCOUNTER — Encounter: Payer: Self-pay | Admitting: Oncology

## 2023-06-02 ENCOUNTER — Telehealth: Payer: Self-pay | Admitting: Oncology

## 2023-06-02 DIAGNOSIS — R92321 Mammographic fibroglandular density, right breast: Secondary | ICD-10-CM | POA: Diagnosis not present

## 2023-06-02 DIAGNOSIS — Z853 Personal history of malignant neoplasm of breast: Secondary | ICD-10-CM | POA: Diagnosis not present

## 2023-06-02 DIAGNOSIS — N6313 Unspecified lump in the right breast, lower outer quadrant: Secondary | ICD-10-CM | POA: Diagnosis not present

## 2023-06-02 NOTE — Telephone Encounter (Signed)
06/02/23 Patient having Right breast Biopsy on 06/10/23@1PM 

## 2023-06-04 DIAGNOSIS — H25811 Combined forms of age-related cataract, right eye: Secondary | ICD-10-CM | POA: Diagnosis not present

## 2023-06-09 ENCOUNTER — Encounter: Payer: Self-pay | Admitting: Family Medicine

## 2023-06-09 ENCOUNTER — Ambulatory Visit (INDEPENDENT_AMBULATORY_CARE_PROVIDER_SITE_OTHER): Payer: Medicare Other | Admitting: Family Medicine

## 2023-06-09 VITALS — BP 138/81 | HR 61 | Temp 97.3°F | Ht 67.0 in | Wt 230.0 lb

## 2023-06-09 DIAGNOSIS — Z794 Long term (current) use of insulin: Secondary | ICD-10-CM | POA: Diagnosis not present

## 2023-06-09 DIAGNOSIS — E0821 Diabetes mellitus due to underlying condition with diabetic nephropathy: Secondary | ICD-10-CM | POA: Diagnosis not present

## 2023-06-09 DIAGNOSIS — M1990 Unspecified osteoarthritis, unspecified site: Secondary | ICD-10-CM | POA: Diagnosis not present

## 2023-06-09 DIAGNOSIS — R768 Other specified abnormal immunological findings in serum: Secondary | ICD-10-CM | POA: Diagnosis not present

## 2023-06-09 DIAGNOSIS — E1142 Type 2 diabetes mellitus with diabetic polyneuropathy: Secondary | ICD-10-CM | POA: Diagnosis not present

## 2023-06-09 LAB — POCT GLYCOSYLATED HEMOGLOBIN (HGB A1C): Hemoglobin A1C: 6 % — AB (ref 4.0–5.6)

## 2023-06-09 MED ORDER — FLUTICASONE PROPIONATE 50 MCG/ACT NA SUSP: 1.0000 | Freq: Every day | NASAL | 5 refills | Status: DC | PRN

## 2023-06-09 NOTE — Patient Instructions (Addendum)
It was nice to see you today,  We addressed the following topics today: -Continue managing your diabetes with your current routine of insulin. - I refilled your Flonase prescription - For your peripheral neuropathy I will get a thiamine and B12 level.  Sometimes neuropathy can be worsened by vitamin B deficiencies, specifically B1, B6, B12.  You are already taking B6 in the form of niacin.  If you would like to try supplementing thiamine (B1) and vitamin B12 I would suggest taking 200 mg of thiamine daily and 2000 mcg of vitamin B12 daily.  Both of these are available over-the-counter. - I will also check a rheumatoid factor level as a possible cause of your arthritis. - I will talk to you about the results when we get them.  Have a great day,  Frederic Jericho, MD

## 2023-06-09 NOTE — Progress Notes (Signed)
   Established Patient Office Visit  Subjective   Patient ID: Jasmine Davila, female    DOB: 11-Nov-1951  Age: 72 y.o. MRN: [id number]  Chief Complaint  Patient presents with   Follow-up    HPI  HTN - losartan 25mg .  Patient compliant with this medication.  No concerns.  DM2-patient takes Lantus 70 units in the morning, uses a sliding scale of Humalog at home.  Generally takes 8 to 10 units before a meal and then sometimes another few units of insulin after meal depending on what her blood sugar looks like.  Patient does have occasional low glucose values.  She was never tried on metformin.  States she was told that with her chronic pancreatitis issues it would be unlikely to help.  Patient wears a Dexcom continuous glucose monitor.  Neuropathy-patient has been to a neurologist and other specialist before has had extensive workup of her neuropathy.  Currently on a max dose of gabapentin.  Has tried duloxetine and Elavil in the past and did not tolerate this medication.  Has tried other medications for neuropathy in the past.  They were either ineffective or  Breast cancer-patient not taking any oral medication for breast cancer at this time.  Had chemotherapy in the past and this worsened her neuropathy.  HLD-patient is on niacin.  Did not tolerate statins in the past.  Taking this medication she states more so because she has diabetes then because of the elevated cholesterol level.  The 10-year ASCVD risk score (Arnett DK, et al., 2019) is: 43.3%     ROS    Objective:     BP 138/81   Pulse 61   Temp (!) 97.3 F (36.3 C) (Oral)   Ht 5\' 7"  (1.702 m)   Wt 230 lb (104.3 kg)   LMP 12/21/1998 (Approximate)   SpO2 98%   BMI 36.02 kg/m    Physical Exam General: Alert and oriented.  Accompanied by husband Pulmonary: No respiratory distress Psych: Pleasant affect   Results for orders placed or performed in visit on 06/09/23  POCT HgB A1C  Result Value Ref Range    Hemoglobin A1C 6.0 (A) 4.0 - 5.6 %   HbA1c POC (<> result, manual entry)     HbA1c, POC (prediabetic range)     HbA1c, POC (controlled diabetic range)            Assessment & Plan:   Diabetes mellitus due to underlying condition with diabetic nephropathy, with long-term current use of insulin (HCC) -     POCT glycosylated hemoglobin (Hb A1C) -     Basic metabolic panel -     Microalbumin / creatinine urine ratio  Diabetic peripheral neuropathy associated with type 2 diabetes mellitus (HCC) -     B12 and Folate Panel -     Vitamin B1  Arthritis -     Rheumatoid factor  Other orders -     Fluticasone Propionate; Place 1 spray into both nostrils daily as needed for allergies or rhinitis.  Dispense: 16 g; Refill: 5     Return in about 6 months (around 12/09/2023) for DM, HTN.    Sandre Kitty, MD

## 2023-06-10 DIAGNOSIS — N6313 Unspecified lump in the right breast, lower outer quadrant: Secondary | ICD-10-CM | POA: Diagnosis not present

## 2023-06-10 DIAGNOSIS — R928 Other abnormal and inconclusive findings on diagnostic imaging of breast: Secondary | ICD-10-CM | POA: Diagnosis not present

## 2023-06-10 DIAGNOSIS — Z853 Personal history of malignant neoplasm of breast: Secondary | ICD-10-CM | POA: Diagnosis not present

## 2023-06-10 LAB — MICROALBUMIN / CREATININE URINE RATIO
Creatinine, Urine: 188.3 mg/dL
Microalb/Creat Ratio: 6 mg/g creat (ref 0–29)
Microalbumin, Urine: 11.5 ug/mL

## 2023-06-10 LAB — BASIC METABOLIC PANEL: BUN: 18 mg/dL (ref 8–27)

## 2023-06-11 ENCOUNTER — Encounter: Payer: Self-pay | Admitting: Oncology

## 2023-06-11 DIAGNOSIS — C50911 Malignant neoplasm of unspecified site of right female breast: Secondary | ICD-10-CM | POA: Insufficient documentation

## 2023-06-13 LAB — RHEUMATOID FACTOR: Rheumatoid fact SerPl-aCnc: 219.6 IU/mL — ABNORMAL HIGH (ref <14.0)

## 2023-06-13 LAB — BASIC METABOLIC PANEL
BUN/Creatinine Ratio: 24 (ref 12–28)
CO2: 25 mmol/L (ref 20–29)
Calcium: 10.1 mg/dL (ref 8.7–10.3)
Chloride: 100 mmol/L (ref 96–106)
Creatinine, Ser: 0.76 mg/dL (ref 0.57–1.00)
Glucose: 129 mg/dL — ABNORMAL HIGH (ref 70–99)
Potassium: 4.8 mmol/L (ref 3.5–5.2)
Sodium: 139 mmol/L (ref 134–144)
eGFR: 83 mL/min/{1.73_m2} (ref >59)

## 2023-06-13 LAB — B12 AND FOLATE PANEL
Folate: >20 ng/mL (ref >3.0)
Vitamin B-12: 729 pg/mL (ref 232–1245)

## 2023-06-13 LAB — VITAMIN B1: Thiamine: 92.4 nmol/L (ref 66.5–200.0)

## 2023-06-14 ENCOUNTER — Other Ambulatory Visit: Payer: Self-pay | Admitting: Family Medicine

## 2023-06-14 LAB — SPECIMEN STATUS REPORT

## 2023-06-14 NOTE — Progress Notes (Signed)
Initial phone contact with newly diagnosed breast cancer. Pt had DCIS 3 years ago. Pt has appt scheduled for 06/18/2023 with Dr. Lequita Halt for surgical consult. Encouraged pt to call with questions or concerns.

## 2023-06-15 ENCOUNTER — Encounter: Payer: Self-pay | Admitting: Oncology

## 2023-06-17 ENCOUNTER — Encounter: Payer: Self-pay | Admitting: *Deleted

## 2023-06-18 DIAGNOSIS — Z170 Estrogen receptor positive status [ER+]: Secondary | ICD-10-CM | POA: Diagnosis not present

## 2023-06-18 DIAGNOSIS — C50511 Malignant neoplasm of lower-outer quadrant of right female breast: Secondary | ICD-10-CM | POA: Diagnosis not present

## 2023-06-21 LAB — ANTI-CCP AB, IGG + IGA (RDL): Anti-CCP Ab, IgG + IgA (RDL): <20 Units (ref <20)

## 2023-07-05 ENCOUNTER — Telehealth: Payer: Self-pay | Admitting: *Deleted

## 2023-07-05 NOTE — Telephone Encounter (Signed)
[employer] calling to state that patient may be diabetic and that a statin may be beneficial for patient.  They are faxing this request and state that if appropriate then to send Rx.  And if they do not tolerate a stating to please update with the proper ICD10 code.

## 2023-07-07 ENCOUNTER — Other Ambulatory Visit: Payer: Self-pay | Admitting: Family Medicine

## 2023-07-16 ENCOUNTER — Encounter: Payer: Self-pay | Admitting: Oncology

## 2023-07-19 DIAGNOSIS — Z170 Estrogen receptor positive status [ER+]: Secondary | ICD-10-CM | POA: Diagnosis not present

## 2023-07-19 DIAGNOSIS — Z7982 Long term (current) use of aspirin: Secondary | ICD-10-CM | POA: Diagnosis not present

## 2023-07-19 DIAGNOSIS — E119 Type 2 diabetes mellitus without complications: Secondary | ICD-10-CM | POA: Diagnosis not present

## 2023-07-19 DIAGNOSIS — I10 Essential (primary) hypertension: Secondary | ICD-10-CM | POA: Diagnosis not present

## 2023-07-19 DIAGNOSIS — N649 Disorder of breast, unspecified: Secondary | ICD-10-CM | POA: Diagnosis not present

## 2023-07-19 DIAGNOSIS — Z79899 Other long term (current) drug therapy: Secondary | ICD-10-CM | POA: Diagnosis not present

## 2023-07-19 DIAGNOSIS — C50511 Malignant neoplasm of lower-outer quadrant of right female breast: Secondary | ICD-10-CM | POA: Diagnosis not present

## 2023-07-19 DIAGNOSIS — Z794 Long term (current) use of insulin: Secondary | ICD-10-CM | POA: Diagnosis not present

## 2023-07-26 ENCOUNTER — Other Ambulatory Visit: Payer: Self-pay | Admitting: Family Medicine

## 2023-07-26 ENCOUNTER — Telehealth: Payer: Self-pay | Admitting: *Deleted

## 2023-07-26 ENCOUNTER — Ambulatory Visit (INDEPENDENT_AMBULATORY_CARE_PROVIDER_SITE_OTHER): Payer: Medicare Other | Admitting: Family Medicine

## 2023-07-26 DIAGNOSIS — R300 Dysuria: Secondary | ICD-10-CM | POA: Diagnosis not present

## 2023-07-26 DIAGNOSIS — R319 Hematuria, unspecified: Secondary | ICD-10-CM | POA: Diagnosis not present

## 2023-07-26 DIAGNOSIS — R82998 Other abnormal findings in urine: Secondary | ICD-10-CM | POA: Diagnosis not present

## 2023-07-26 LAB — POCT URINALYSIS DIP (CLINITEK)
Bilirubin, UA: NEGATIVE
Glucose, UA: NEGATIVE mg/dL
Ketones, POC UA: NEGATIVE mg/dL
Nitrite, UA: POSITIVE — AB
POC PROTEIN,UA: 100 — AB
Spec Grav, UA: 1.02 (ref 1.010–1.025)
Urobilinogen, UA: 0.2 E.U./dL
pH, UA: 7 (ref 5.0–8.0)

## 2023-07-26 MED ORDER — CEPHALEXIN 500 MG PO CAPS: 500.0000 mg | ORAL_CAPSULE | Freq: Two times a day (BID) | ORAL | 0 refills | Status: AC

## 2023-07-26 NOTE — Progress Notes (Signed)
Patient has been informed of lab results. Patient verbalized understanding. All questions and concerns have been addressed.

## 2023-07-26 NOTE — Progress Notes (Signed)
Patient complains of painful urination. Submitted urine sample for POC UA. Urine is positive for infection. Urine culture ordered and provider has been notified.

## 2023-07-26 NOTE — Telephone Encounter (Signed)
Pt calling to see about appointment and help with MyChart, provided her with the number to call for help.

## 2023-07-26 NOTE — Progress Notes (Signed)
Urine sample provided indicated infection.  Sending urine culture. Will send in prescription for keflex to take twice a day for 5 days.    Please call pt to let her know.

## 2023-08-03 ENCOUNTER — Encounter: Payer: Self-pay | Admitting: Oncology

## 2023-08-04 ENCOUNTER — Encounter: Payer: Self-pay | Admitting: Diagnostic Radiology

## 2023-08-04 ENCOUNTER — Telehealth: Payer: Self-pay

## 2023-08-04 NOTE — Telephone Encounter (Signed)
-----   Message from Dellia Beckwith sent at 08/04/2023  3:15 PM EDT ----- Regarding: surgery? Has her surgery been scheduled yet?

## 2023-08-04 NOTE — Telephone Encounter (Signed)
Printed information and gave to Dr. Gilman Buttner

## 2023-08-06 DIAGNOSIS — Z794 Long term (current) use of insulin: Secondary | ICD-10-CM | POA: Diagnosis not present

## 2023-08-06 DIAGNOSIS — D0511 Intraductal carcinoma in situ of right breast: Secondary | ICD-10-CM | POA: Diagnosis not present

## 2023-08-06 DIAGNOSIS — Z170 Estrogen receptor positive status [ER+]: Secondary | ICD-10-CM | POA: Diagnosis not present

## 2023-08-06 DIAGNOSIS — E119 Type 2 diabetes mellitus without complications: Secondary | ICD-10-CM | POA: Diagnosis not present

## 2023-08-06 DIAGNOSIS — C50511 Malignant neoplasm of lower-outer quadrant of right female breast: Secondary | ICD-10-CM | POA: Diagnosis not present

## 2023-08-06 DIAGNOSIS — Z7982 Long term (current) use of aspirin: Secondary | ICD-10-CM | POA: Diagnosis not present

## 2023-08-06 DIAGNOSIS — Z79899 Other long term (current) drug therapy: Secondary | ICD-10-CM | POA: Diagnosis not present

## 2023-08-06 DIAGNOSIS — I10 Essential (primary) hypertension: Secondary | ICD-10-CM | POA: Diagnosis not present

## 2023-08-10 NOTE — Progress Notes (Signed)
[facility]  [address] [place],  [place]  [address] [phone]  Clinic Day:  08/11/23   Referring physician: No ref. provider found  CHIEF COMPLAINT:  CC:  New Hormone receptor positive invasive ductal carcinoma, stage IA  Current Treatment:   Endopredict, hormonal therapy, radiotherapy   HISTORY OF PRESENT ILLNESS:  Jasmine Davila is a 72 y.o. 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 Annual screening mammogram revealed an area of calcifications with in the right breast working further evaluation.  Diagnostic unilateral right mammogram in May confirmed an area of pleomorphic calcifications spanning 1.1 cm in the lower inner quadrant of the right breast.  Stereotactic biopsy revealed intermediate grade, ductal carcinoma in situ with necrosis.  Fibrocystic changes including florid and micropapillary usual ductal hyperplasia and micropapillary apocrine metaplasia were also seen.  No invasive carcinoma was identified.  Estrogen receptor was positive at 90% and progesterone receptor was positive at 95%.  She underwent lumpectomy in June.  Surgical pathology from this procedure confirmed ductal carcinoma in situ, intermediate grade.  The residual disease was only 1 mm remaining after the biopsy.  No invasive carcinoma identified and margins were free of neoplasm. We recommended chemoprevention with raloxifene, however, she declined.  She also has a history of stage IV diffuse large B-cell lymphoma diagnosed in May 2016, treated with 6 cycles of R-CHOP with a complete response.  She   has remained without evidence of recurrence. She is up to date on her colonoscopy, with the last one being in November 2020.  This is to be repeated in 5 years due to positive family history.  She had a bone density scan in June 2020, which was normal.  She has had pancreatitis on and off since 2008.  Annual  mammography from May 2022 was clear. CT imaging of the chest, abdomen and pelvis from August 2022 revealed no evidence of metastatic breast cancer or recurrence of lymphoma.   INTERVAL HISTORY:  Jasmine Davila is here for a new invasive ductal carcinoma of the right breast.  She has been followed for hormone receptor positive ductal carcinoma in situ of the right breast from 2021, as well as history of stage IV diffuse large B-cell lymphoma. She now had a mass detected in the right lateral breast on screening mammogram in June of 2024, and confirmed by diagnostic mammogram. The right axilla was negative. She has had a second lumpectomy on 07/19/2023 of the lower outer quadrant of the right breast. The pathology report showed invasive ductal carcinoma, stage IA, grade 2, and ductal carcinoma in situ. All margins negative for invasive ductal carcinoma and DCIS, no regional lymph nodes found, coil clip and biopsy site reaction present. Estrogen receptor positive at 100%, progesterone negative and HER2 negative, with a Ki67 of 30%. She complains of occasional right breast pain. I explained the pathologiy to her and her husband in detail. I informed her that she will not need to have a CT scan because the lymph nodes were negative. At this point, I do not know if she will need chemotherapy but I will recommend radiation therapy because it will lower her chances of the cancer coming back in the breast. I told her about the side effects like fatigue and skin changes. After the radiation is done she will need hormone therapy, 1 pill daily for 5 years. To determine if she will need chemotherapy, we will analyze the genes  of her tumor through EndoPredict testing to determine the risk of her cancer coming back. Right now I believe her risk might be around 10% - 20%. I advised her that she qualifies for genetic counseling and she will meet with the genetic counselor to discuss this.  Her maternal great aunt had breast cancer and  her mother had colon cancer and died a few months after diagnosis. This is her second breast cancer. I will see her  back in 2 weeks with CBC and CMP. She requested low dose Xanax for anxiety and I will prescribe 0.25 mg as needed.  She  denies signs of infections such as sore throat, sinus drainage, cough or urinary symptoms. She  denies fever or recurrent chills. She  also deny nausea, vomiting, chest pain, or dyspnea. Her  appetite is good and Her  weight has increased 1 pounds over last 2 months  This patient is accompanied in the office by her  husband .     REVIEW OF SYSTEMS:  Review of Systems  Constitutional: Negative.  Negative for appetite change, chills, diaphoresis, fatigue, fever and unexpected weight change.  HENT:  Negative.  Negative for hearing loss, lump/mass, mouth sores, nosebleeds, sore throat, tinnitus, trouble swallowing and voice change.   Eyes: Negative.  Negative for eye problems and icterus.  Respiratory: Negative.  Negative for chest tightness, cough, hemoptysis, shortness of breath and wheezing.        Occasional right breast pain post-op  Cardiovascular: Negative.  Negative for chest pain, leg swelling and palpitations.  Gastrointestinal: Negative.  Negative for abdominal distention, abdominal pain, blood in stool, constipation, diarrhea, nausea, rectal pain and vomiting.  Endocrine: Negative.   Genitourinary:  Negative for bladder incontinence, difficulty urinating, dyspareunia, dysuria, frequency, hematuria, menstrual problem, nocturia, pelvic pain, vaginal bleeding and vaginal discharge.   Musculoskeletal:  Positive for arthralgias (of the knees) and gait problem. Negative for back pain, flank pain, myalgias, neck pain and neck stiffness.  Skin: Negative.  Negative for itching, rash and wound.        A small 0.5cm in diameter black mole in the left upper posterior shoulder  Neurological:  Positive for gait problem. Negative for dizziness, extremity weakness,  headaches, light-headedness, numbness, seizures and speech difficulty.  Hematological: Negative.  Negative for adenopathy. Does not bruise/bleed easily.  Psychiatric/Behavioral:  Negative for confusion, decreased concentration, depression, sleep disturbance and suicidal ideas. The patient is nervous/anxious.     VITALS:  Blood pressure (!) 141/68, pulse 73, temperature 97.6 F (36.4 C), temperature source Oral, resp. rate 16, height 5\' 7"  (1.702 m), weight 231 lb 8 oz (105 kg), last menstrual period 12/21/1998, SpO2 99%.  Wt Readings from Last 3 Encounters:  08/11/23 231 lb 8 oz (105 kg)  06/09/23 230 lb (104.3 kg)  03/09/23 234 lb (106.1 kg)    Body mass index is 36.26 kg/m.  Performance status (ECOG): 0 - Asymptomatic  PHYSICAL EXAM:  Physical Exam Vitals and nursing note reviewed.  Constitutional:      General: She is not in acute distress.    Appearance: Normal appearance. She is normal weight. She is not ill-appearing, toxic-appearing or diaphoretic.  HENT:     Head: Normocephalic and atraumatic.     Right Ear: Tympanic membrane, ear canal and external ear normal. There is no impacted cerumen.     Left Ear: Tympanic membrane, ear canal and external ear normal. There is no impacted cerumen.     Nose: Nose normal. No congestion or  rhinorrhea.     Mouth/Throat:     Mouth: Mucous membranes are moist.     Pharynx: Oropharynx is clear. No oropharyngeal exudate or posterior oropharyngeal erythema.  Eyes:     General: No scleral icterus.       Right eye: No discharge.        Left eye: No discharge.     Extraocular Movements: Extraocular movements intact.     Conjunctiva/sclera: Conjunctivae normal.     Pupils: Pupils are equal, round, and reactive to light.  Neck:     Vascular: No carotid bruit.  Cardiovascular:     Rate and Rhythm: Normal rate and regular rhythm.     Pulses: Normal pulses.     Heart sounds: Normal heart sounds. No murmur heard.    No friction rub. No gallop.   Pulmonary:     Effort: Pulmonary effort is normal. No respiratory distress.     Breath sounds: Normal breath sounds. No stridor. No wheezing, rhonchi or rales.  Chest:     Chest wall: No tenderness.     Comments: Faint scar in the lower inner quadrant of the right breast. Both breasts are without masses. Minor fibrocystic changes of the right breast.  Abdominal:     General: Bowel sounds are normal. There is no distension.     Palpations: Abdomen is soft. There is no hepatomegaly, splenomegaly or mass.     Tenderness: There is no abdominal tenderness. There is no right CVA tenderness, left CVA tenderness, guarding or rebound.     Hernia: No hernia is present.  Musculoskeletal:        General: No swelling, tenderness, deformity or signs of injury. Normal range of motion.     Cervical back: Normal range of motion and neck supple. No rigidity or tenderness.     Right lower leg: No edema.     Left lower leg: No edema.  Lymphadenopathy:     Cervical: No cervical adenopathy.  Skin:    General: Skin is warm and dry.     Coloration: Skin is not jaundiced or pale.     Findings: No bruising, erythema, lesion or rash.  Neurological:     General: No focal deficit present.     Mental Status: She is alert and oriented to person, place, and time. Mental status is at baseline.  Psychiatric:        Mood and Affect: Mood normal.        Behavior: Behavior normal.        Thought Content: Thought content normal.        Judgment: Judgment normal.    LABS:   Component Ref Range & Units 2 mo ago (06/09/23) 6 mo ago (02/10/23) 9 mo ago (10/21/22) 1 yr ago (08/10/22) 1 yr ago (08/10/22) 1 yr ago (07/16/22) 1 yr ago (05/28/22)  Glucose 70 - 99 mg/dL 161 High  096 High  CM 129 High   87 R 117 High  CM 70 CM  BUN 8 - 27 mg/dL 18 12 R 13  10 R 9 R 13 R  Creatinine, Ser 0.57 - 1.00 mg/dL 0.45 4.09 R 8.11  0.6 R 0.66 R 0.66 R  eGFR >59 mL/min/1.73 83  78      BUN/Creatinine Ratio 12 - 28 24  16        Sodium 134 - 144 mmol/L 139 134 Low  R 136  136 Abnormal  R 137 R 137 R  Potassium 3.5 - 5.2 mmol/L  4.8 3.8 R 4.4  4.0 R 4.2 R 4.2 R  Chloride 96 - 106 mmol/L 100 97 Low  R 96  100 R 101 R 100 R  CO2 20 - 29 mmol/L 25 29 R 27  31 Abnormal  R 27 R 29 R  Calcium 8.7 - 10.3 mg/dL 81.1 9.5 R 91.4 9.6 R  9.9 R 9.5 R       Latest Ref Rng & Units 02/10/2023   11:07 AM 10/21/2022   10:52 AM 08/10/2022   12:00 AM  CBC  WBC 4.0 - 10.5 K/uL 4.9  5.3  4.4      Hemoglobin 12.0 - 15.0 g/dL 78.2  95.6  21.3      Hematocrit 36.0 - 46.0 % 40.6  40.7  38      Platelets 150 - 400 K/uL 153  181  155         This result is from an external source.      Latest Ref Rng & Units 06/09/2023   11:14 AM 02/10/2023   11:07 AM 10/21/2022   10:52 AM  CMP  Glucose 70 - 99 mg/dL 086  578  469   BUN 8 - 27 mg/dL 18  12  13    Creatinine 0.57 - 1.00 mg/dL 6.29  5.28  4.13   Sodium 134 - 144 mmol/L 139  134  136   Potassium 3.5 - 5.2 mmol/L 4.8  3.8  4.4   Chloride 96 - 106 mmol/L 100  97  96   CO2 20 - 29 mmol/L 25  29  27    Calcium 8.7 - 10.3 mg/dL 24.4  9.5  01.0   Total Protein 6.5 - 8.1 g/dL  7.4  7.8   Total Bilirubin 0.3 - 1.2 mg/dL  0.7  0.5   Alkaline Phos 38 - 126 U/L  60  94   AST 15 - 41 U/L  26  21   ALT 0 - 44 U/L  21  17     Lab Results  Component Value Date   ALBUMINELP 4.1 08/30/2018   A1GS 0.3 08/30/2018   A2GS 0.8 08/30/2018   BETS 0.5 08/30/2018   BETA2SER 0.4 08/30/2018   GAMS 1.5 08/30/2018   SPEI  08/30/2018     Comment:     Normal Serum Protein Electrophoresis Pattern. No abnormal protein bands (M-protein) detected.    No results found for: "TIBC", "FERRITIN", "IRONPCTSAT" Lab Results  Component Value Date   LDH 175 02/10/2023   LDH 174 08/10/2022       Component Ref Range & Units 2 mo ago 4 yr ago  Rheumatoid fact SerPl-aCnc <14.0 IU/mL 219.6 High  173 High        Component Ref Range & Units 2 mo ago  Anti-CCP Ab, IgG + IgA (RDL) <20 Units <20                Component Ref Range & Units 2 mo ago (06/09/23) 9 mo ago (10/21/22) 1 yr ago (10/07/21) 2 yr ago (01/01/21) 2 yr ago (09/09/20) 3 yr ago (03/08/20) 5 yr ago (03/09/18)  Vitamin B-12 232 - 1,245 pg/mL 729 691 675 660 677 720 719  Folate >3.0 ng/mL >20.0      >20.0 CM        Component Ref Range & Units 2 mo ago  Thiamine 66.5 - 200.0 nmol/L 92.4   Component Ref Range & Units 3 mo ago (10/21/22)  Vitamin B6 3.4 -  65.2 ug/L 57.6   Component Ref Range & Units 3 mo ago 10/21/22  Vit D, 25-Hydroxy 30.0 - 100.0 ng/mL 62.4     Component Ref Range & Units 3 mo ago (10/21/22) 10 mo ago (03/20/22)  Cholesterol, Total 100 - 199 mg/dL 161 096  Triglycerides 0 - 149 mg/dL 045 68  HDL >40 mg/dL 75 68  VLDL Cholesterol Cal 5 - 40 mg/dL 19 13  LDL Chol Calc (NIH) 0 - 99 mg/dL 91 74  Chol/HDL Ratio 0.0 - 4.4 ratio 2.5 2.3 CM   Component Ref Range & Units 3 mo ago (10/21/22)  TSH 0.450 - 4.500 uIU/mL 1.540              Component Ref Range & Units 2 mo ago (06/09/23) 9 mo ago (10/21/22) 1 yr ago (05/28/22) 1 yr ago (01/28/22) 1 yr ago (10/07/21) 2 yr ago (07/07/21) 2 yr ago (01/01/21)  Hemoglobin A1C 4.0 - 5.6 % 6.0 Abnormal  R 6.1 High  R, CM 5.9 High  R, CM 5.4 5.4 6.3 High  R, CM  HbA1c POC (<> result, manual entry)  5.8 R    5.4 R   HbA1c, POC (prediabetic range)         HbA1c, POC (controlled diabetic range)      5.4 R         STUDIES:   EXAM: 07/19/2023 PATHOLOGY FOR RIGHT BREAST LUMPECTOMY Invasive ductal carcinoma, grade 2, and ductal carcinoma in situ.  All margins negative for invasive ductal carcinoma and DCIS No regional lymph nodes found Coli clip and biopsy site reaction present.         EXAM: 07/16/2022 CT ABDOMEN AND PELVIS WITH CONTRAST Impression: 1. Mild to moderate acute diverticulitis of the proximal sigmoid colon, with inflamed diverticulum and surrounding mesenteric edema but no extraluminal gas or abscess. Trace free pelvic fluid. 2. Chronic  intrahepatic and extrahepatic biliary dilatation, no change from prior exam. 3. Subtle subcapsular hypodensity anteriorly in the right hepatic lobe, given the indistinct nature of this is probably from mild focal steatosis. 4. Other imaging findings of potential clinical significance: Aortic Atherosclerosis (ICD10-I70.0). Prominent bilateral foraminal impingement at L5-S1 due to spurring. Mild cardiomegaly. Small type 1 hiatal hernia. Stable left lower lobe scarring.    HISTORY:   Allergies:  Allergies  Allergen Reactions   Doxycycline Other (See Comments)    Pancreatic issues   Hydromorphone Other (See Comments)   Metronidazole Other (See Comments)    Cream  Burned face   Dilaudid [Hydromorphone Hcl] Anxiety    Current Medications: Current Outpatient Medications  Medication Sig Dispense Refill   ALPRAZolam (XANAX) 0.25 MG tablet Take 1 tablet (0.25 mg total) by mouth 3 (three) times daily as needed for anxiety. 30 tablet 0   aspirin EC 81 MG tablet Take 1 tablet (81 mg total) by mouth 2 (two) times daily. To be taken after surgery to prevent blood clots (Patient taking differently: Take 81 mg by mouth once. To be taken after surgery to prevent blood clots) 84 tablet 0   carboxymethylcellulose (REFRESH TEARS) 0.5 % SOLN Place 1 drop into both eyes 3 (three) times daily as needed (dry eyes).     cetirizine (ALLERGY, CETIRIZINE,) 10 MG tablet Take 10 mg by mouth daily.     Continuous Blood Gluc Receiver (DEXCOM G6 RECEIVER) DEVI Use to check blood sugars every morning fasting and 2 hours after largest meal 1 each 0   Continuous Blood Gluc Sensor (DEXCOM G6  SENSOR) MISC Apply new sensor every 10 days.  Use to check blood sugars every morning fasting and 2 hours after largest meal 9 each 1   Continuous Blood Gluc Transmit (DEXCOM G6 TRANSMITTER) MISC Use to check blood sugars every morning fasting and 2 hours after largest meal 1 each 0   famotidine (PEPCID) 20 MG tablet Take 1 tablet  (20 mg total) by mouth 2 (two) times daily. 200 tablet 2   fluticasone (FLONASE) 50 MCG/ACT nasal spray Place 1 spray into both nostrils daily as needed for allergies or rhinitis. 16 g 5   folic acid (FOLVITE) 400 MCG tablet Take 400 mcg by mouth daily.     gabapentin (NEURONTIN) 600 MG tablet TAKE 2 TABLETS BY MOUTH 3 TIMES  DAILY 540 tablet 3   insulin glargine (LANTUS SOLOSTAR) 100 UNIT/ML Solostar Pen INJECT 70 UNITS INTO THE SKIN DAILY. NEED OFFICE VISIT FOR FURTHER REFILLS 15 mL 3   insulin lispro (HUMALOG KWIKPEN) 100 UNIT/ML KwikPen Inject 20 Units into the skin 3 (three) times daily. INJECT 20 UNITS 3 TIMES A DAY WITH MEALS 54 mL 1   losartan (COZAAR) 25 MG tablet TAKE 1 TABLET BY MOUTH DAILY 90 tablet 3   Multiple Vitamin (MULTIVITAMIN) tablet Take 1 tablet by mouth daily.     NIACIN PO Take 1,000 mg by mouth at bedtime. Take 1 hour after the 81 mg aspirin     PIP PEN NEEDLES 31G X 31G X 5 MM MISC USE AS DIRECTED WITH INSULIN PENS (4 PER DAY)     Current Facility-Administered Medications  Medication Dose Route Frequency Provider Last Rate Last Admin   betamethasone acetate-betamethasone sodium phosphate (CELESTONE) injection 3 mg  3 mg Intra-articular Once Felecia Shelling, DPM         ASSESSMENT & PLAN:   Assessment:   1.  Stage 0 ductal carcinoma in situ, treated with lumpectomy. We offered chemoprevention with raloxifene for a total of 5 years, but she declined.  She remains without evidence of disease, nearly 3 years postop.   2.  History of stage IV diffuse large B-cell lymphoma diagnosed in May 2016, treated with 6 cycles of R-CHOP chemotherapy with a complete response.  Most recent imaging from August 2022 reveals no evidence of lymphoma recurrence. As she is over 5 years, we will not plan to repeat imaging unless otherwise indicated.   3.  Mediastinal node vs thyroid nodule, which has been stable. The radiologist feels it is more likely a thyroid nodule.    4. Small  black mole in her left posterior upper shoulder which appears benign and measures 0.5cm. I offered to have the dermatologist to evaluate but I do feel this is benign, so we have decided to continue observation.  5. Newly diagnosed stage IA right breast invasive ductal carcinoma. This is ER positive and PR negative, HER2 negative and grade 2. I have therefore recommended EndoPredict testing to make a decision whether chemotherapy is appropriate. I have explained this to her and her husband and will have her return in 2 weeks to review the results. She will definitely need hormonal therapy and I will recommend right breast radiation as well. She is a healthy active 72 year old woman.  Plan:  She has had a second lumpectomy on 07/19/2023 on the right breast lower outer quadrant. The pathology report showed invasive ductal carcinoma, stage IA, grade 2, and ductal carcinoma in situ. All margins negative for invasive ductal carcinoma and DCIS, no  regional lymph nodes found, coli clip and biopsy site reaction present. Estrogen receptor positive at 100%, progesterone negative and HER2 negative, with a Ki67 of 30%. She complains of occasional right breast pain. I explained the pathology to her and her husband in detail. I informed her that she will not need to have a CT scan because the lymph nodes were negative. At this point, I do not know if she will need chemotherapy but I will recommend radiation therapy because it will lower her chances of the cancer coming back in the breast. I told her about the side effects such as fatigue and skin changes. After the radiation is done she will need hormone therapy, 1 pill daily for 5 years. To determine if she will need chemotherapy, we will analyze the genes on her tumor through EndoPredict testing to determine the risk of her cancer coming back. Right now I believe her risk might be around 10% - 20%. I advised her that she qualifies for genetic counseling and she will meet  with the genetic counselor to discuss this. Her maternal great aunt had breast cancer and her mother had colon cancer and died a few months after diagnosis. This is her second breast cancer.  She requested low dose Xanax for anxiety and I will prescribe 0.25 mg as I will see her  back in 2 weeks with CBC and CMP. She and her husband verbalize understanding of and agreement to the plans discussed today. She knows to call the office should any new questions or concerns arise.    I provided 40 minutes of face-to-face time during this this encounter and > 50% was spent counseling as documented under my assessment and plan.     I,Oluwatobi Asade,acting as a scribe for Dellia Beckwith, MD.,have documented all relevant documentation on the behalf of Dellia Beckwith, MD,as directed by  Dellia Beckwith, MD while in the presence of Dellia Beckwith, MD.

## 2023-08-11 ENCOUNTER — Encounter: Payer: Self-pay | Admitting: Oncology

## 2023-08-11 ENCOUNTER — Other Ambulatory Visit: Payer: Self-pay | Admitting: Oncology

## 2023-08-11 ENCOUNTER — Inpatient Hospital Stay: Payer: Medicare Other | Attending: Oncology | Admitting: Oncology

## 2023-08-11 VITALS — BP 141/68 | HR 73 | Temp 97.6°F | Resp 16 | Ht 67.0 in | Wt 231.5 lb

## 2023-08-11 DIAGNOSIS — C801 Malignant (primary) neoplasm, unspecified: Secondary | ICD-10-CM

## 2023-08-11 DIAGNOSIS — D0511 Intraductal carcinoma in situ of right breast: Secondary | ICD-10-CM | POA: Diagnosis not present

## 2023-08-11 DIAGNOSIS — C50511 Malignant neoplasm of lower-outer quadrant of right female breast: Secondary | ICD-10-CM

## 2023-08-11 MED ORDER — ALPRAZOLAM 0.25 MG PO TABS
0.2500 mg | ORAL_TABLET | Freq: Three times a day (TID) | ORAL | 0 refills | Status: DC | PRN
Start: 2023-08-11 — End: 2023-09-06

## 2023-08-11 NOTE — Progress Notes (Signed)
Face to face contact with pt in exam room. Pt is here for Med Onc follow up. Pt reports that she has done well with her lumpectomy and will now be doing radiation. Encouraged pt to call with questions or concerns.

## 2023-08-19 ENCOUNTER — Telehealth: Payer: Self-pay | Admitting: Orthopaedic Surgery

## 2023-08-19 NOTE — Telephone Encounter (Signed)
Called patient left message to return call to reschedule her appointment with Mardella Layman for the afternoon   (Same Day Appointment)

## 2023-08-24 NOTE — Progress Notes (Incomplete)
[facility]  [address] 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 Annual screening mammogram revealed an area of calcifications with in the right breast working further evaluation.  Diagnostic unilateral right mammogram in May confirmed an area of pleomorphic calcifications spanning 1.1 cm in the lower inner quadrant of the right breast.  Stereotactic biopsy revealed intermediate grade, ductal carcinoma in situ with necrosis.  Fibrocystic changes including florid and micropapillary usual ductal hyperplasia and micropapillary apocrine metaplasia were also seen.  No invasive carcinoma was identified.  Estrogen receptor was positive at 90% and progesterone receptor was positive at 95%.  She underwent lumpectomy in June.  Surgical pathology from this procedure confirmed ductal carcinoma in situ, intermediate grade.  The residual disease was only 1 mm remaining after the biopsy.  No invasive carcinoma identified and margins were free of neoplasm. We recommended chemoprevention with raloxifene, however, she declined.  She also has a history of stage IV diffuse large B-cell lymphoma diagnosed in May 2016, treated with 6 cycles of R-CHOP with a complete response.  She   has remained without evidence of recurrence. She is up to date on her colonoscopy, with the last one being in November 2020.  This is to be repeated in 5 years due to positive family history.  She had a bone density scan in June 2020, which was normal.  She has had pancreatitis  on and off since 2008.    Annual mammography from May 2022 was clear. CT imaging of the chest, abdomen and pelvis from August 2022 revealed no evidence of metastatic breast cancer or recurrence of lymphoma.   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 Patient states that she feels *** and ***.   She had a EndoPredict done on 08/18/2023 that revealed a low risk Epclin score of 2.7 with a 5.6% for recurrence in 0-10 years and a 1.3% for an estimated absolute chemotherapy benefit at 10 years. Her likelihood of late distant recurrence in 5-15 years is 4.4%.   She denies signs of infection such as sore throat, sinus drainage, cough, or urinary symptoms.  She denies fevers or recurrent chills. She denies pain. She denies nausea, vomiting, chest pain, dyspnea or cough. Her appetite is *** and her weight {Weight change:10426}.  Patient has had right knee replacement surgery 8 months ago and complains that there is still some constant pain. Patient states that she feels well. She will be due for her annual mammogram after May, 22nd. The patient wanted me to check a small 0.5 cm in diameter black mole in the left upper posterior shoulder that rubs on her bra strap but has no bleeding. I believe it to be benign. She will keep watch on it to see if there are any changes. I will see her back in 6 months for reevaluation. She denies signs of infection such as sore throat,  sinus drainage, cough, or urinary symptoms.  She denies night sweets, fevers, or recurrent chills. She denies pain. She denies nausea, vomiting, chest pain, dyspnea or cough. Her appetite is good and her weight has increased 8 pounds over last 3.5 .  REVIEW OF SYSTEMS:  Review of Systems  Constitutional: Negative.  Negative for appetite change, chills, diaphoresis, fatigue, fever and unexpected  weight change.  HENT:  Negative.  Negative for hearing loss, lump/mass, mouth sores, nosebleeds, sore throat, tinnitus, trouble swallowing and voice change.   Eyes: Negative.  Negative for eye problems and icterus.  Respiratory: Negative.  Negative for chest tightness, cough, hemoptysis, shortness of breath and wheezing.   Cardiovascular: Negative.  Negative for chest pain, leg swelling and palpitations.  Gastrointestinal: Negative.  Negative for abdominal distention, abdominal pain, blood in stool, constipation, diarrhea, nausea, rectal pain and vomiting.  Endocrine: Negative.   Genitourinary:  Negative for bladder incontinence, difficulty urinating, dyspareunia, dysuria, frequency, hematuria, menstrual problem, nocturia, pelvic pain, vaginal bleeding and vaginal discharge.   Musculoskeletal:  Positive for arthralgias (of the knees) and gait problem. Negative for back pain, flank pain, myalgias, neck pain and neck stiffness.  Skin: Negative.  Negative for itching, rash and wound.        A small 0.5cm in diameter black mole in the left upper posterior shoulder  Neurological:  Positive for gait problem. Negative for dizziness, extremity weakness, headaches, light-headedness, numbness, seizures and speech difficulty.  Hematological: Negative.  Negative for adenopathy. Does not bruise/bleed easily.  Psychiatric/Behavioral: Negative.  Negative for confusion, decreased concentration, depression, sleep disturbance and suicidal ideas. The patient is not nervous/anxious.     VITALS:  Last menstrual period 12/21/1998.  Wt Readings from Last 3 Encounters:  08/11/23 231 lb 8 oz (105 kg)  06/09/23 230 lb (104.3 kg)  03/09/23 234 lb (106.1 kg)    There is no height or weight on file to calculate BMI.  Performance status (ECOG): 0 - Asymptomatic  PHYSICAL EXAM:  Physical Exam Vitals and nursing note reviewed.  Constitutional:      General: She is not in acute distress.    Appearance: Normal  appearance. She is normal weight. She is not ill-appearing, toxic-appearing or diaphoretic.  HENT:     Head: Normocephalic and atraumatic.     Right Ear: Tympanic membrane, ear canal and external ear normal. There is no impacted cerumen.     Left Ear: Tympanic membrane, ear canal and external ear normal. There is no impacted cerumen.     Nose: Nose normal. No congestion or rhinorrhea.     Mouth/Throat:     Mouth: Mucous membranes are moist.     Pharynx: Oropharynx is clear. No oropharyngeal exudate or posterior oropharyngeal erythema.  Eyes:     General: No scleral icterus.       Right eye: No discharge.        Left eye: No discharge.     Extraocular Movements: Extraocular movements intact.     Conjunctiva/sclera: Conjunctivae normal.     Pupils: Pupils are equal, round, and reactive to light.  Neck:     Vascular: No carotid bruit.  Cardiovascular:     Rate and Rhythm: Normal rate and regular rhythm.     Pulses: Normal pulses.     Heart sounds: Normal heart sounds. No murmur heard.    No friction rub. No gallop.  Pulmonary:     Effort: Pulmonary effort is normal. No respiratory distress.     Breath sounds: Normal  breath sounds. No stridor. No wheezing, rhonchi or rales.  Chest:     Chest wall: No tenderness.     Comments: Faint scar in the lower inner quadrant of the right breast. Both breasts are without masses. Minor fibrocystic changes of the right breast.  Abdominal:     General: Bowel sounds are normal. There is no distension.     Palpations: Abdomen is soft. There is no hepatomegaly, splenomegaly or mass.     Tenderness: There is no abdominal tenderness. There is no right CVA tenderness, left CVA tenderness, guarding or rebound.     Hernia: No hernia is present.  Musculoskeletal:        General: No swelling, tenderness, deformity or signs of injury. Normal range of motion.     Cervical back: Normal range of motion and neck supple. No rigidity or tenderness.     Right lower  leg: No edema.     Left lower leg: No edema.  Lymphadenopathy:     Cervical: No cervical adenopathy.  Skin:    General: Skin is warm and dry.     Coloration: Skin is not jaundiced or pale.     Findings: No bruising, erythema, lesion or rash.  Neurological:     General: No focal deficit present.     Mental Status: She is alert and oriented to person, place, and time. Mental status is at baseline.  Psychiatric:        Mood and Affect: Mood normal.        Behavior: Behavior normal.        Thought Content: Thought content normal.        Judgment: Judgment normal.    LABS:      Latest Ref Rng & Units 02/10/2023   11:07 AM 10/21/2022   10:52 AM 08/10/2022   12:00 AM  CBC  WBC 4.0 - 10.5 K/uL 4.9  5.3  4.4      Hemoglobin 12.0 - 15.0 g/dL 16.1  09.6  04.5      Hematocrit 36.0 - 46.0 % 40.6  40.7  38      Platelets 150 - 400 K/uL 153  181  155         This result is from an external source.      Latest Ref Rng & Units 06/09/2023   11:14 AM 02/10/2023   11:07 AM 10/21/2022   10:52 AM  CMP  Glucose 70 - 99 mg/dL 409  811  914   BUN 8 - 27 mg/dL 18  12  13    Creatinine 0.57 - 1.00 mg/dL 7.82  9.56  2.13   Sodium 134 - 144 mmol/L 139  134  136   Potassium 3.5 - 5.2 mmol/L 4.8  3.8  4.4   Chloride 96 - 106 mmol/L 100  97  96   CO2 20 - 29 mmol/L 25  29  27    Calcium 8.7 - 10.3 mg/dL 08.6  9.5  57.8   Total Protein 6.5 - 8.1 g/dL  7.4  7.8   Total Bilirubin 0.3 - 1.2 mg/dL  0.7  0.5   Alkaline Phos 38 - 126 U/L  60  94   AST 15 - 41 U/L  26  21   ALT 0 - 44 U/L  21  17    Component Ref Range & Units 06/09/23  Thiamine 66.5 - 200.0 nmol/L 92.4   omponent Ref Range & Units 2 mo ago (06/09/23) 10 mo ago (10/21/22)  1 yr ago (10/07/21) 2 yr ago (01/01/21) 2 yr ago (09/09/20) 3 yr ago (03/08/20) 5 yr ago (03/09/18)  Vitamin B-12 232 - 1,245 pg/mL 729 691 675 660 677 720 719  Folate >3.0 ng/mL >20.0          Lab Results  Component Value Date   ALBUMINELP 4.1 08/30/2018    A1GS 0.3 08/30/2018   A2GS 0.8 08/30/2018   BETS 0.5 08/30/2018   BETA2SER 0.4 08/30/2018   GAMS 1.5 08/30/2018   SPEI  08/30/2018     Comment:     Normal Serum Protein Electrophoresis Pattern. No abnormal protein bands (M-protein) detected.    No results found for: "TIBC", "FERRITIN", "IRONPCTSAT" Lab Results  Component Value Date   LDH 175 02/10/2023   LDH 174 08/10/2022    STUDIES:          HISTORY:   Allergies:  Allergies  Allergen Reactions   Doxycycline Other (See Comments)    Pancreatic issues   Hydromorphone Other (See Comments)   Metronidazole Other (See Comments)    Cream  Burned face   Dilaudid [Hydromorphone Hcl] Anxiety    Current Medications: Current Outpatient Medications  Medication Sig Dispense Refill   ALPRAZolam (XANAX) 0.25 MG tablet Take 1 tablet (0.25 mg total) by mouth 3 (three) times daily as needed for anxiety. 30 tablet 0   aspirin EC 81 MG tablet Take 1 tablet (81 mg total) by mouth 2 (two) times daily. To be taken after surgery to prevent blood clots (Patient taking differently: Take 81 mg by mouth once. To be taken after surgery to prevent blood clots) 84 tablet 0   carboxymethylcellulose (REFRESH TEARS) 0.5 % SOLN Place 1 drop into both eyes 3 (three) times daily as needed (dry eyes).     cetirizine (ALLERGY, CETIRIZINE,) 10 MG tablet Take 10 mg by mouth daily.     Continuous Blood Gluc Receiver (DEXCOM G6 RECEIVER) DEVI Use to check blood sugars every morning fasting and 2 hours after largest meal 1 each 0   Continuous Blood Gluc Sensor (DEXCOM G6 SENSOR) MISC Apply new sensor every 10 days.  Use to check blood sugars every morning fasting and 2 hours after largest meal 9 each 1   Continuous Blood Gluc Transmit (DEXCOM G6 TRANSMITTER) MISC Use to check blood sugars every morning fasting and 2 hours after largest meal 1 each 0   famotidine (PEPCID) 20 MG tablet Take 1 tablet (20 mg total) by mouth 2 (two) times daily. 200 tablet 2    fluticasone (FLONASE) 50 MCG/ACT nasal spray Place 1 spray into both nostrils daily as needed for allergies or rhinitis. 16 g 5   folic acid (FOLVITE) 400 MCG tablet Take 400 mcg by mouth daily.     gabapentin (NEURONTIN) 600 MG tablet TAKE 2 TABLETS BY MOUTH 3 TIMES  DAILY 540 tablet 3   insulin glargine (LANTUS SOLOSTAR) 100 UNIT/ML Solostar Pen INJECT 70 UNITS INTO THE SKIN DAILY. NEED OFFICE VISIT FOR FURTHER REFILLS 15 mL 3   insulin lispro (HUMALOG KWIKPEN) 100 UNIT/ML KwikPen Inject 20 Units into the skin 3 (three) times daily. INJECT 20 UNITS 3 TIMES A DAY WITH MEALS 54 mL 1   losartan (COZAAR) 25 MG tablet TAKE 1 TABLET BY MOUTH DAILY 90 tablet 3   Multiple Vitamin (MULTIVITAMIN) tablet Take 1 tablet by mouth daily.     NIACIN PO Take 1,000 mg by mouth at bedtime. Take 1 hour after the 81 mg aspirin  PIP PEN NEEDLES 31G X 31G X 5 MM MISC USE AS DIRECTED WITH INSULIN PENS (4 PER DAY)     Current Facility-Administered Medications  Medication Dose Route Frequency Provider Last Rate Last Admin   betamethasone acetate-betamethasone sodium phosphate (CELESTONE) injection 3 mg  3 mg Intra-articular Once Felecia Shelling, DPM         ASSESSMENT & PLAN:  Assessment:   1.  Stage 0 ductal carcinoma in situ, treated with lumpectomy. We offered chemoprevention with raloxifene for a total of 5 years, but she declined.  She remains without evidence of disease, nearly 3 years postop.   2.  History of stage IV diffuse large B-cell lymphoma diagnosed in May 2016, treated with 6 cycles of R-CHOP chemotherapy with a complete response.  Most recent imaging from August 2022 reveals no evidence of lymphoma recurrence. As she is over 5 years, we will not plan to repeat imaging unless otherwise indicated.   3.  Mediastinal node vs thyroid nodule, which has been stable. The radiologist feels it is more likely a thyroid nodule.    4. Small black mole in her left posterior upper shoulder which appears  benign and measures 0.5cm. I offered to have the dermatologist to evaluate but I do feel this is benign, so we have decided to continue observation.  Plan:  I offered to have the dermatologist to evaluate the mole, but I do feel this is benign so we have decided to continue observation. She will be due for her annual mammogram after May, 22nd. Today's labs are pending and I will call her with the results. I will see her back in 6 months for reevaluation. She verbalizes understanding of and agreement to the plans discussed today. She knows to call the office should any new questions or concerns arise.    I provided 10 minutes of face-to-face time during this this encounter and > 50% was spent counseling as documented under my assessment and plan.    Dellia Beckwith, MD  [facility] [address] [place] [address] Dept: [phone] Dept Fax: [phone]     Rulon Sera Lassiter,acting as a scribe for Dellia Beckwith, MD.,have documented all relevant documentation on the behalf of Dellia Beckwith, MD,as directed by  Dellia Beckwith, MD while in the presence of Dellia Beckwith, MD.

## 2023-08-26 ENCOUNTER — Inpatient Hospital Stay: Payer: Medicare Other | Attending: Oncology | Admitting: Oncology

## 2023-08-26 ENCOUNTER — Encounter: Payer: Self-pay | Admitting: Oncology

## 2023-08-26 ENCOUNTER — Other Ambulatory Visit: Payer: Self-pay | Admitting: Oncology

## 2023-08-26 VITALS — BP 119/77 | HR 70 | Temp 97.9°F | Resp 18 | Ht 67.0 in | Wt 232.4 lb

## 2023-08-26 DIAGNOSIS — C801 Malignant (primary) neoplasm, unspecified: Secondary | ICD-10-CM

## 2023-08-26 DIAGNOSIS — Z170 Estrogen receptor positive status [ER+]: Secondary | ICD-10-CM | POA: Diagnosis not present

## 2023-08-26 DIAGNOSIS — C50511 Malignant neoplasm of lower-outer quadrant of right female breast: Secondary | ICD-10-CM | POA: Diagnosis not present

## 2023-08-26 NOTE — Progress Notes (Signed)
[facility]  [address] [place],  [place]  [address] [phone]  Clinic Day:  08/26/23   Referring physician: Sandre Kitty, MD  CHIEF COMPLAINT:  CC:  New Hormone receptor positive invasive ductal carcinoma, stage IA  Current Treatment:   Endopredict, hormonal therapy, radiotherapy   HISTORY OF PRESENT ILLNESS:  Jasmine Davila is a 72 y.o. 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 Annual screening mammogram revealed an area of calcifications with in the right breast working further evaluation.  Diagnostic unilateral right mammogram in May confirmed an area of pleomorphic calcifications spanning 1.1 cm in the lower inner quadrant of the right breast.  Stereotactic biopsy revealed intermediate grade, ductal carcinoma in situ with necrosis.  Fibrocystic changes including florid and micropapillary usual ductal hyperplasia and micropapillary apocrine metaplasia were also seen.  No invasive carcinoma was identified.  Estrogen receptor was positive at 90% and progesterone receptor was positive at 95%.  She underwent lumpectomy in June.  Surgical pathology from this procedure confirmed ductal carcinoma in situ, intermediate grade.  The residual disease was only 1 mm remaining after the biopsy.  No invasive carcinoma identified and margins were free of neoplasm. We recommended chemoprevention with raloxifene, however, she declined.  She also has a history of stage IV diffuse large B-cell lymphoma diagnosed in May 2016, treated with 6 cycles of R-CHOP with a complete response.  She   has remained without evidence of recurrence. She is up to date on her colonoscopy, with the last one being in November 2020.  This is to be repeated in 5 years due to positive family history.  She had a bone density scan in June 2020, which was normal.  She has had pancreatitis on and off since 2008.  Annual  mammography from May 2022 was clear. CT imaging of the chest, abdomen and pelvis from August 2022 revealed no evidence of metastatic breast cancer or recurrence of lymphoma.   She has a new invasive carcinoma found in June of 2024, that was confirmed by diagnostic mammogram. The right axilla was negative. She has had a second lumpectomy on 07/19/2023 of the lower outer quadrant of the right breast. The pathology report showed invasive ductal carcinoma, stage IA, grade 2, and ductal carcinoma in situ. All margins negative for invasive ductal carcinoma and DCIS, no regional lymph nodes found, coil clip and biopsy site reaction present. Estrogen receptor positive at 100%, progesterone negative and HER2 negative, with a Ki67 of 30%. She had a EndoPredict done on 08/18/2023 that revealed a low risk Epclin score of 2.7 with a 5.6% for recurrence in 0-10 years and a 1.3% for an estimated absolute chemotherapy benefit at 10 years. Her likelihood of late distant recurrence in 5-15 years is 4.4%.   INTERVAL HISTORY:  Jasmine Davila is here for a new invasive ductal carcinoma of the right breast.  She has been followed for hormone receptor positive ductal carcinoma in situ of the right breast from 2021, as well as history of stage IV diffuse large B-cell lymphoma. She has a new mass detected in the right lateral breast on screening mammogram in June of 2024, that was confirmed by diagnostic mammogram. The right axilla was negative. She has had a second lumpectomy on 07/19/2023 of the lower outer quadrant of the right breast. The pathology report showed invasive ductal carcinoma, stage IA, grade 2, and ductal carcinoma in situ. All margins negative for  invasive ductal carcinoma and DCIS, no regional lymph nodes found, coil clip and biopsy site reaction present. Estrogen receptor positive at 100%, progesterone negative and HER2 negative, with a Ki67 of 30%.   Patient states that she feels ok but complains of occasional right  breast pain. She had a EndoPredict done on 08/18/2023 that revealed a low risk Epclin score of 2.7 for a 5.6% risk for recurrence in 0-10 years and 1.3% for an estimated absolute chemotherapy benefit at 10 years. Her likelihood of late distant recurrence in 5-15 years is 4.4%. I advised her that it would be best to take hormone therapy and that she will not need chemotherapy. She will have her genetics appointment on October, 4th. I also recommended that she receive radiation. Her last bone density scan was done on 06/15/2019 that was normal. I informed her of different hormone therapy medication such as Tamoxifen and Anastrozole and discussed the pros, cons, and potential side effects. I also informed her that she will have a consultation with Dr. Thersa Salt to further discuss radiation. I will schedule a CT Chest, abdomen, and pelvis soon and a bone density scan in the next couple of months. I will see her back in 2 months with CBC and CMP.   She denies signs of infection such as sore throat, sinus drainage, cough, or urinary symptoms.  She denies fevers or recurrent chills. She denies pain. She denies nausea, vomiting, chest pain, dyspnea or cough. Her appetite is great and her weight has increased 1 pounds over last 2 weeks .This patient is accompanied in the office by her  husband .     REVIEW OF SYSTEMS:  Review of Systems  Constitutional: Negative.  Negative for appetite change, chills, diaphoresis, fatigue, fever and unexpected weight change.  HENT:  Negative.  Negative for hearing loss, lump/mass, mouth sores, nosebleeds, sore throat, tinnitus, trouble swallowing and voice change.   Eyes: Negative.  Negative for eye problems and icterus.  Respiratory: Negative.  Negative for chest tightness, cough, hemoptysis, shortness of breath and wheezing.        Occasional right breast pain post-op  Cardiovascular: Negative.  Negative for chest pain, leg swelling and palpitations.  Gastrointestinal: Negative.   Negative for abdominal distention, abdominal pain, blood in stool, constipation, diarrhea, nausea, rectal pain and vomiting.  Endocrine: Negative.   Genitourinary:  Negative for bladder incontinence, difficulty urinating, dyspareunia, dysuria, frequency, hematuria, menstrual problem, nocturia, pelvic pain, vaginal bleeding and vaginal discharge.   Musculoskeletal:  Positive for arthralgias (of the knees) and gait problem. Negative for back pain, flank pain, myalgias, neck pain and neck stiffness.  Skin: Negative.  Negative for itching, rash and wound.        A small 0.5cm in diameter black mole in the left upper posterior shoulder  Neurological:  Positive for gait problem. Negative for dizziness, extremity weakness, headaches, light-headedness, numbness, seizures and speech difficulty.  Hematological: Negative.  Negative for adenopathy. Does not bruise/bleed easily.  Psychiatric/Behavioral:  Negative for confusion, decreased concentration, depression, sleep disturbance and suicidal ideas. The patient is nervous/anxious.     VITALS:  Blood pressure 119/77, pulse 70, temperature 97.9 F (36.6 C), temperature source Oral, resp. rate 18, height 5\' 7"  (1.702 m), weight 232 lb 6.4 oz (105.4 kg), last menstrual period 12/21/1998, SpO2 100%.  Wt Readings from Last 3 Encounters:  08/26/23 232 lb 6.4 oz (105.4 kg)  08/11/23 231 lb 8 oz (105 kg)  06/09/23 230 lb (104.3 kg)    Body mass  index is 36.4 kg/m.  Performance status (ECOG): 0 - Asymptomatic  PHYSICAL EXAM:  Physical Exam Vitals and nursing note reviewed.  Constitutional:      General: She is not in acute distress.    Appearance: Normal appearance. She is normal weight. She is not ill-appearing, toxic-appearing or diaphoretic.  HENT:     Head: Normocephalic and atraumatic.     Right Ear: Tympanic membrane, ear canal and external ear normal. There is no impacted cerumen.     Left Ear: Tympanic membrane, ear canal and external ear normal.  There is no impacted cerumen.     Nose: Nose normal. No congestion or rhinorrhea.     Mouth/Throat:     Mouth: Mucous membranes are moist.     Pharynx: Oropharynx is clear. No oropharyngeal exudate or posterior oropharyngeal erythema.  Eyes:     General: No scleral icterus.       Right eye: No discharge.        Left eye: No discharge.     Extraocular Movements: Extraocular movements intact.     Conjunctiva/sclera: Conjunctivae normal.     Pupils: Pupils are equal, round, and reactive to light.  Neck:     Vascular: No carotid bruit.  Cardiovascular:     Rate and Rhythm: Normal rate and regular rhythm.     Pulses: Normal pulses.     Heart sounds: Normal heart sounds. No murmur heard.    No friction rub. No gallop.  Pulmonary:     Effort: Pulmonary effort is normal. No respiratory distress.     Breath sounds: Normal breath sounds. No stridor. No wheezing, rhonchi or rales.  Chest:     Chest wall: No tenderness.     Comments: Healing incision in the lower outer quadrant of the right breast. Both breasts are without masses. Minor fibrocystic changes of the right breast.  Abdominal:     General: Bowel sounds are normal. There is no distension.     Palpations: Abdomen is soft. There is no hepatomegaly, splenomegaly or mass.     Tenderness: There is no abdominal tenderness. There is no right CVA tenderness, left CVA tenderness, guarding or rebound.     Hernia: No hernia is present.  Musculoskeletal:        General: No swelling, tenderness, deformity or signs of injury. Normal range of motion.     Cervical back: Normal range of motion and neck supple. No rigidity or tenderness.     Right lower leg: No edema.     Left lower leg: No edema.  Lymphadenopathy:     Cervical: No cervical adenopathy.  Skin:    General: Skin is warm and dry.     Coloration: Skin is not jaundiced or pale.     Findings: No bruising, erythema, lesion or rash.  Neurological:     General: No focal deficit  present.     Mental Status: She is alert and oriented to person, place, and time. Mental status is at baseline.  Psychiatric:        Mood and Affect: Mood normal.        Behavior: Behavior normal.        Thought Content: Thought content normal.        Judgment: Judgment normal.   LABS:   Component Ref Range & Units 2 mo ago (06/09/23) 6 mo ago (02/10/23) 9 mo ago (10/21/22) 1 yr ago (08/10/22) 1 yr ago (08/10/22) 1 yr ago (07/16/22) 1 yr ago (05/28/22)  Glucose 70 - 99 mg/dL 409 High  811 High  CM 129 High   87 R 117 High  CM 70 CM  BUN 8 - 27 mg/dL 18 12 R 13  10 R 9 R 13 R  Creatinine, Ser 0.57 - 1.00 mg/dL 9.14 7.82 R 9.56  0.6 R 0.66 R 0.66 R  eGFR >59 mL/min/1.73 83  78      BUN/Creatinine Ratio 12 - 28 24  16       Sodium 134 - 144 mmol/L 139 134 Low  R 136  136 Abnormal  R 137 R 137 R  Potassium 3.5 - 5.2 mmol/L 4.8 3.8 R 4.4  4.0 R 4.2 R 4.2 R  Chloride 96 - 106 mmol/L 100 97 Low  R 96  100 R 101 R 100 R  CO2 20 - 29 mmol/L 25 29 R 27  31 Abnormal  R 27 R 29 R  Calcium 8.7 - 10.3 mg/dL 21.3 9.5 R 08.6 9.6 R  9.9 R 9.5 R       Latest Ref Rng & Units 02/10/2023   11:07 AM 10/21/2022   10:52 AM 08/10/2022   12:00 AM  CBC  WBC 4.0 - 10.5 K/uL 4.9  5.3  4.4      Hemoglobin 12.0 - 15.0 g/dL 57.8  46.9  62.9      Hematocrit 36.0 - 46.0 % 40.6  40.7  38      Platelets 150 - 400 K/uL 153  181  155         This result is from an external source.      Latest Ref Rng & Units 06/09/2023   11:14 AM 02/10/2023   11:07 AM 10/21/2022   10:52 AM  CMP  Glucose 70 - 99 mg/dL 528  413  244   BUN 8 - 27 mg/dL 18  12  13    Creatinine 0.57 - 1.00 mg/dL 0.10  2.72  5.36   Sodium 134 - 144 mmol/L 139  134  136   Potassium 3.5 - 5.2 mmol/L 4.8  3.8  4.4   Chloride 96 - 106 mmol/L 100  97  96   CO2 20 - 29 mmol/L 25  29  27    Calcium 8.7 - 10.3 mg/dL 64.4  9.5  03.4   Total Protein 6.5 - 8.1 g/dL  7.4  7.8   Total Bilirubin 0.3 - 1.2 mg/dL  0.7  0.5   Alkaline Phos 38 - 126 U/L  60   94   AST 15 - 41 U/L  26  21   ALT 0 - 44 U/L  21  17     Lab Results  Component Value Date   ALBUMINELP 4.1 08/30/2018   A1GS 0.3 08/30/2018   A2GS 0.8 08/30/2018   BETS 0.5 08/30/2018   BETA2SER 0.4 08/30/2018   GAMS 1.5 08/30/2018   SPEI  08/30/2018     Comment:     Normal Serum Protein Electrophoresis Pattern. No abnormal protein bands (M-protein) detected.    No results found for: "TIBC", "FERRITIN", "IRONPCTSAT" Lab Results  Component Value Date   LDH 175 02/10/2023   LDH 174 08/10/2022       Component Ref Range & Units 2 mo ago 4 yr ago  Rheumatoid fact SerPl-aCnc <14.0 IU/mL 219.6 High  173 High        Component Ref Range & Units 2 mo ago  Anti-CCP Ab, IgG + IgA (RDL) <20 Units <20  Component Ref Range & Units 2 mo ago (06/09/23) 9 mo ago (10/21/22) 1 yr ago (10/07/21) 2 yr ago (01/01/21) 2 yr ago (09/09/20) 3 yr ago (03/08/20) 5 yr ago (03/09/18)  Vitamin B-12 232 - 1,245 pg/mL 729 691 675 660 677 720 719  Folate >3.0 ng/mL >20.0      >20.0 CM        Component Ref Range & Units 2 mo ago  Thiamine 66.5 - 200.0 nmol/L 92.4   Component Ref Range & Units 3 mo ago (10/21/22)  Vitamin B6 3.4 - 65.2 ug/L 57.6   Component Ref Range & Units 3 mo ago 10/21/22  Vit D, 25-Hydroxy 30.0 - 100.0 ng/mL 62.4     Component Ref Range & Units 3 mo ago (10/21/22) 10 mo ago (03/20/22)  Cholesterol, Total 100 - 199 mg/dL 213 086  Triglycerides 0 - 149 mg/dL 578 68  HDL >46 mg/dL 75 68  VLDL Cholesterol Cal 5 - 40 mg/dL 19 13  LDL Chol Calc (NIH) 0 - 99 mg/dL 91 74  Chol/HDL Ratio 0.0 - 4.4 ratio 2.5 2.3 CM   Component Ref Range & Units 3 mo ago (10/21/22)  TSH 0.450 - 4.500 uIU/mL 1.540              Component Ref Range & Units 2 mo ago (06/09/23) 9 mo ago (10/21/22) 1 yr ago (05/28/22) 1 yr ago (01/28/22) 1 yr ago (10/07/21) 2 yr ago (07/07/21) 2 yr ago (01/01/21)  Hemoglobin A1C 4.0 - 5.6 % 6.0 Abnormal  R 6.1 High  R, CM 5.9 High  R, CM  5.4 5.4 6.3 High  R, CM  HbA1c POC (<> result, manual entry)  5.8 R    5.4 R   HbA1c, POC (prediabetic range)         HbA1c, POC (controlled diabetic range)      5.4 R         STUDIES:   EXAM: 07/19/2023 PATHOLOGY FOR RIGHT BREAST LUMPECTOMY Invasive ductal carcinoma, grade 2, and ductal carcinoma in situ.  All margins negative for invasive ductal carcinoma and DCIS No regional lymph nodes found Coli clip and biopsy site reaction present.          HISTORY:   Allergies:  Allergies  Allergen Reactions   Doxycycline Other (See Comments)    Pancreatic issues   Hydromorphone Other (See Comments)   Metronidazole Other (See Comments)    Cream  Burned face   Dilaudid [Hydromorphone Hcl] Anxiety    Current Medications: Current Outpatient Medications  Medication Sig Dispense Refill   ALPRAZolam (XANAX) 0.25 MG tablet Take 1 tablet (0.25 mg total) by mouth 3 (three) times daily as needed for anxiety. 30 tablet 0   aspirin EC 81 MG tablet Take 1 tablet (81 mg total) by mouth 2 (two) times daily. To be taken after surgery to prevent blood clots (Patient taking differently: Take 81 mg by mouth once. To be taken after surgery to prevent blood clots) 84 tablet 0   carboxymethylcellulose (REFRESH TEARS) 0.5 % SOLN Place 1 drop into both eyes 3 (three) times daily as needed (dry eyes).     cetirizine (ALLERGY, CETIRIZINE,) 10 MG tablet Take 10 mg by mouth daily.     Continuous Blood Gluc Receiver (DEXCOM G6 RECEIVER) DEVI Use to check blood sugars every morning fasting and 2 hours after largest meal 1 each 0   Continuous Blood Gluc Sensor (DEXCOM G6 SENSOR) MISC Apply new sensor every 10 days.  Use to check blood sugars every morning fasting and 2 hours after largest meal 9 each 1   Continuous Blood Gluc Transmit (DEXCOM G6 TRANSMITTER) MISC Use to check blood sugars every morning fasting and 2 hours after largest meal 1 each 0   famotidine (PEPCID) 20 MG tablet Take 1 tablet (20 mg  total) by mouth 2 (two) times daily. 200 tablet 2   fluticasone (FLONASE) 50 MCG/ACT nasal spray Place 1 spray into both nostrils daily as needed for allergies or rhinitis. 16 g 5   folic acid (FOLVITE) 400 MCG tablet Take 400 mcg by mouth daily.     gabapentin (NEURONTIN) 600 MG tablet TAKE 2 TABLETS BY MOUTH 3 TIMES  DAILY 540 tablet 3   insulin glargine (LANTUS SOLOSTAR) 100 UNIT/ML Solostar Pen INJECT 70 UNITS INTO THE SKIN DAILY. NEED OFFICE VISIT FOR FURTHER REFILLS 15 mL 3   insulin lispro (HUMALOG KWIKPEN) 100 UNIT/ML KwikPen Inject 20 Units into the skin 3 (three) times daily. INJECT 20 UNITS 3 TIMES A DAY WITH MEALS 54 mL 1   losartan (COZAAR) 25 MG tablet TAKE 1 TABLET BY MOUTH DAILY 90 tablet 3   Multiple Vitamin (MULTIVITAMIN) tablet Take 1 tablet by mouth daily.     NIACIN PO Take 1,000 mg by mouth at bedtime. Take 1 hour after the 81 mg aspirin     PIP PEN NEEDLES 31G X 31G X 5 MM MISC USE AS DIRECTED WITH INSULIN PENS (4 PER DAY)     Current Facility-Administered Medications  Medication Dose Route Frequency Provider Last Rate Last Admin   betamethasone acetate-betamethasone sodium phosphate (CELESTONE) injection 3 mg  3 mg Intra-articular Once Felecia Shelling, DPM         ASSESSMENT & PLAN:   Assessment:   1.  Stage 0 ductal carcinoma in situ, treated with lumpectomy. We offered chemoprevention with raloxifene for a total of 5 years, but she declined.  She remains without evidence of disease, nearly 3 years postop.   2.  History of stage IV diffuse large B-cell lymphoma diagnosed in May 2016, treated with 6 cycles of R-CHOP chemotherapy with a complete response.  Most recent imaging from August 2022 reveals no evidence of lymphoma recurrence. As she is over 5 years, we will not plan to repeat imaging unless otherwise indicated.   3.  Mediastinal node vs thyroid nodule, which has been stable. The radiologist feels it is more likely a thyroid nodule.    4. Small black mole  in her left posterior upper shoulder which appears benign and measures 0.5cm. I offered to have the dermatologist to evaluate but I do feel this is benign, so we have decided to continue observation.  5. Newly diagnosed stage IA right breast invasive ductal carcinoma. This is ER positive and PR negative, HER2 negative and grade 2.  She had a EndoPredict done on 08/18/2023 that revealed a low risk Epclin score of 2.7 for a 5.6% risk for recurrence in 0-10 years and 1.3% for an estimated absolute chemotherapy benefit at 10 years. Her likelihood of late distant recurrence in 5-15 years is 4.4%. She will definitely need hormonal therapy and I will recommend right breast radiation as well. She is a healthy active 72 year old woman.  6.  She is appropriate for referral to the genetics clinic and has an appointment on October 4.  She has now had 2 breast cancers herself and a maternal great aunt had breast cancer.  Her mother died  of colon cancer.  Plan:  She has had a second lumpectomy on 07/19/2023 on the right breast lower outer quadrant. The pathology report showed invasive ductal carcinoma, stage IA, grade 2, and ductal carcinoma in situ. All margins negative for invasive ductal carcinoma and DCIS, no regional lymph nodes found, coli clip and biopsy site reaction present. Estrogen receptor positive at 100%, progesterone negative and HER2 negative, with a Ki67 of 30%. She had a EndoPredict done on 08/18/2023 that revealed a low risk Epclin score of 2.7 for a 5.6% risk for recurrence in 0-10 years and 1.3% for an estimated absolute chemotherapy benefit at 10 years. Her likelihood of late distant recurrence in 5-15 years is 4.4%. I advised her that it would be best to take hormone therapy and that she will not need chemotherapy. She will have her genetics appointment on October, 4th. I also recommended that she receive radiation. Her last bone density scan was done on 06/15/2019 that was normal. I informed her of  different hormone therapy medication such as Tamoxifen and Anastrozole and discussed the pros, cons, and potential side effects. I also informed her that she will have a consultation with Dr. Thersa Salt to further discuss radiation. I will schedule a CT Chest, abdomen, and pelvis soon and a bone density scan in the next couple of months. . I advised her that she qualifies for genetic counseling and she will meet with the genetic counselor on October 4th to discuss this. Her maternal great aunt had breast cancer and her mother had colon cancer and died a few months after diagnosis. This is her second breast cancer.  Her  back in 2 months.  She did have labs done in June. She and her husband verbalize understanding of and agreement to the plans discussed today. She knows to call the office should any new questions or concerns arise.    I provided 40 minutes of face-to-face time during this this encounter and > 50% was spent counseling as documented under my assessment and plan.     I,Oluwatobi Asade,acting as a scribe for Dellia Beckwith, MD.,have documented all relevant documentation on the behalf of Dellia Beckwith, MD,as directed by  Dellia Beckwith, MD while in the presence of Dellia Beckwith, MD.

## 2023-09-03 ENCOUNTER — Ambulatory Visit: Payer: Medicare Other | Admitting: Orthopaedic Surgery

## 2023-09-04 ENCOUNTER — Encounter: Payer: Self-pay | Admitting: Oncology

## 2023-09-06 ENCOUNTER — Other Ambulatory Visit: Payer: Self-pay | Admitting: Oncology

## 2023-09-06 DIAGNOSIS — F411 Generalized anxiety disorder: Secondary | ICD-10-CM

## 2023-09-06 DIAGNOSIS — C50511 Malignant neoplasm of lower-outer quadrant of right female breast: Secondary | ICD-10-CM

## 2023-09-06 DIAGNOSIS — Z170 Estrogen receptor positive status [ER+]: Secondary | ICD-10-CM

## 2023-09-07 ENCOUNTER — Other Ambulatory Visit: Payer: Self-pay | Admitting: Nurse Practitioner

## 2023-09-07 DIAGNOSIS — C50511 Malignant neoplasm of lower-outer quadrant of right female breast: Secondary | ICD-10-CM | POA: Diagnosis not present

## 2023-09-07 DIAGNOSIS — I10 Essential (primary) hypertension: Secondary | ICD-10-CM | POA: Diagnosis not present

## 2023-09-07 DIAGNOSIS — Z794 Long term (current) use of insulin: Secondary | ICD-10-CM | POA: Diagnosis not present

## 2023-09-07 DIAGNOSIS — E119 Type 2 diabetes mellitus without complications: Secondary | ICD-10-CM | POA: Diagnosis not present

## 2023-09-07 DIAGNOSIS — Z79899 Other long term (current) drug therapy: Secondary | ICD-10-CM | POA: Diagnosis not present

## 2023-09-07 DIAGNOSIS — Z7982 Long term (current) use of aspirin: Secondary | ICD-10-CM | POA: Diagnosis not present

## 2023-09-07 DIAGNOSIS — E1142 Type 2 diabetes mellitus with diabetic polyneuropathy: Secondary | ICD-10-CM

## 2023-09-07 DIAGNOSIS — D0511 Intraductal carcinoma in situ of right breast: Secondary | ICD-10-CM | POA: Diagnosis not present

## 2023-09-07 DIAGNOSIS — Z510 Encounter for antineoplastic radiation therapy: Secondary | ICD-10-CM | POA: Diagnosis not present

## 2023-09-07 DIAGNOSIS — Z170 Estrogen receptor positive status [ER+]: Secondary | ICD-10-CM | POA: Diagnosis not present

## 2023-09-07 MED ORDER — ALPRAZOLAM 0.25 MG PO TABS
0.2500 mg | ORAL_TABLET | Freq: Three times a day (TID) | ORAL | 0 refills | Status: DC | PRN
Start: 2023-09-07 — End: 2023-10-04

## 2023-09-08 DIAGNOSIS — Z794 Long term (current) use of insulin: Secondary | ICD-10-CM | POA: Diagnosis not present

## 2023-09-08 DIAGNOSIS — Z510 Encounter for antineoplastic radiation therapy: Secondary | ICD-10-CM | POA: Diagnosis not present

## 2023-09-08 DIAGNOSIS — Z79899 Other long term (current) drug therapy: Secondary | ICD-10-CM | POA: Diagnosis not present

## 2023-09-08 DIAGNOSIS — E119 Type 2 diabetes mellitus without complications: Secondary | ICD-10-CM | POA: Diagnosis not present

## 2023-09-08 DIAGNOSIS — Z170 Estrogen receptor positive status [ER+]: Secondary | ICD-10-CM | POA: Diagnosis not present

## 2023-09-08 DIAGNOSIS — C50511 Malignant neoplasm of lower-outer quadrant of right female breast: Secondary | ICD-10-CM | POA: Diagnosis not present

## 2023-09-08 DIAGNOSIS — Z7982 Long term (current) use of aspirin: Secondary | ICD-10-CM | POA: Diagnosis not present

## 2023-09-08 DIAGNOSIS — D0511 Intraductal carcinoma in situ of right breast: Secondary | ICD-10-CM | POA: Diagnosis not present

## 2023-09-08 DIAGNOSIS — I10 Essential (primary) hypertension: Secondary | ICD-10-CM | POA: Diagnosis not present

## 2023-09-08 MED ORDER — GABAPENTIN 600 MG PO TABS
ORAL_TABLET | ORAL | 3 refills | Status: DC
Start: 2023-09-08 — End: 2024-05-18

## 2023-09-08 NOTE — Telephone Encounter (Signed)
Refill sent in

## 2023-09-13 DIAGNOSIS — Z170 Estrogen receptor positive status [ER+]: Secondary | ICD-10-CM | POA: Diagnosis not present

## 2023-09-13 DIAGNOSIS — Z79899 Other long term (current) drug therapy: Secondary | ICD-10-CM | POA: Diagnosis not present

## 2023-09-13 DIAGNOSIS — Z794 Long term (current) use of insulin: Secondary | ICD-10-CM | POA: Diagnosis not present

## 2023-09-13 DIAGNOSIS — Z510 Encounter for antineoplastic radiation therapy: Secondary | ICD-10-CM | POA: Diagnosis not present

## 2023-09-13 DIAGNOSIS — E119 Type 2 diabetes mellitus without complications: Secondary | ICD-10-CM | POA: Diagnosis not present

## 2023-09-13 DIAGNOSIS — D0511 Intraductal carcinoma in situ of right breast: Secondary | ICD-10-CM | POA: Diagnosis not present

## 2023-09-13 DIAGNOSIS — I10 Essential (primary) hypertension: Secondary | ICD-10-CM | POA: Diagnosis not present

## 2023-09-13 DIAGNOSIS — Z7982 Long term (current) use of aspirin: Secondary | ICD-10-CM | POA: Diagnosis not present

## 2023-09-13 DIAGNOSIS — C50511 Malignant neoplasm of lower-outer quadrant of right female breast: Secondary | ICD-10-CM | POA: Diagnosis not present

## 2023-09-14 DIAGNOSIS — Z7982 Long term (current) use of aspirin: Secondary | ICD-10-CM | POA: Diagnosis not present

## 2023-09-14 DIAGNOSIS — E119 Type 2 diabetes mellitus without complications: Secondary | ICD-10-CM | POA: Diagnosis not present

## 2023-09-14 DIAGNOSIS — Z170 Estrogen receptor positive status [ER+]: Secondary | ICD-10-CM | POA: Diagnosis not present

## 2023-09-14 DIAGNOSIS — I10 Essential (primary) hypertension: Secondary | ICD-10-CM | POA: Diagnosis not present

## 2023-09-14 DIAGNOSIS — C50511 Malignant neoplasm of lower-outer quadrant of right female breast: Secondary | ICD-10-CM | POA: Diagnosis not present

## 2023-09-14 DIAGNOSIS — Z510 Encounter for antineoplastic radiation therapy: Secondary | ICD-10-CM | POA: Diagnosis not present

## 2023-09-14 DIAGNOSIS — Z794 Long term (current) use of insulin: Secondary | ICD-10-CM | POA: Diagnosis not present

## 2023-09-14 DIAGNOSIS — Z79899 Other long term (current) drug therapy: Secondary | ICD-10-CM | POA: Diagnosis not present

## 2023-09-15 DIAGNOSIS — E119 Type 2 diabetes mellitus without complications: Secondary | ICD-10-CM | POA: Diagnosis not present

## 2023-09-15 DIAGNOSIS — Z7982 Long term (current) use of aspirin: Secondary | ICD-10-CM | POA: Diagnosis not present

## 2023-09-15 DIAGNOSIS — Z510 Encounter for antineoplastic radiation therapy: Secondary | ICD-10-CM | POA: Diagnosis not present

## 2023-09-15 DIAGNOSIS — Z170 Estrogen receptor positive status [ER+]: Secondary | ICD-10-CM | POA: Diagnosis not present

## 2023-09-15 DIAGNOSIS — Z794 Long term (current) use of insulin: Secondary | ICD-10-CM | POA: Diagnosis not present

## 2023-09-15 DIAGNOSIS — Z79899 Other long term (current) drug therapy: Secondary | ICD-10-CM | POA: Diagnosis not present

## 2023-09-15 DIAGNOSIS — I10 Essential (primary) hypertension: Secondary | ICD-10-CM | POA: Diagnosis not present

## 2023-09-15 DIAGNOSIS — C50511 Malignant neoplasm of lower-outer quadrant of right female breast: Secondary | ICD-10-CM | POA: Diagnosis not present

## 2023-09-16 DIAGNOSIS — Z794 Long term (current) use of insulin: Secondary | ICD-10-CM | POA: Diagnosis not present

## 2023-09-16 DIAGNOSIS — C50511 Malignant neoplasm of lower-outer quadrant of right female breast: Secondary | ICD-10-CM | POA: Diagnosis not present

## 2023-09-16 DIAGNOSIS — E119 Type 2 diabetes mellitus without complications: Secondary | ICD-10-CM | POA: Diagnosis not present

## 2023-09-16 DIAGNOSIS — Z79899 Other long term (current) drug therapy: Secondary | ICD-10-CM | POA: Diagnosis not present

## 2023-09-16 DIAGNOSIS — I10 Essential (primary) hypertension: Secondary | ICD-10-CM | POA: Diagnosis not present

## 2023-09-16 DIAGNOSIS — Z7982 Long term (current) use of aspirin: Secondary | ICD-10-CM | POA: Diagnosis not present

## 2023-09-16 DIAGNOSIS — Z170 Estrogen receptor positive status [ER+]: Secondary | ICD-10-CM | POA: Diagnosis not present

## 2023-09-16 DIAGNOSIS — Z510 Encounter for antineoplastic radiation therapy: Secondary | ICD-10-CM | POA: Diagnosis not present

## 2023-09-20 DIAGNOSIS — Z7982 Long term (current) use of aspirin: Secondary | ICD-10-CM | POA: Diagnosis not present

## 2023-09-20 DIAGNOSIS — Z794 Long term (current) use of insulin: Secondary | ICD-10-CM | POA: Diagnosis not present

## 2023-09-20 DIAGNOSIS — Z170 Estrogen receptor positive status [ER+]: Secondary | ICD-10-CM | POA: Diagnosis not present

## 2023-09-20 DIAGNOSIS — E119 Type 2 diabetes mellitus without complications: Secondary | ICD-10-CM | POA: Diagnosis not present

## 2023-09-20 DIAGNOSIS — Z510 Encounter for antineoplastic radiation therapy: Secondary | ICD-10-CM | POA: Diagnosis not present

## 2023-09-20 DIAGNOSIS — Z79899 Other long term (current) drug therapy: Secondary | ICD-10-CM | POA: Diagnosis not present

## 2023-09-20 DIAGNOSIS — C50511 Malignant neoplasm of lower-outer quadrant of right female breast: Secondary | ICD-10-CM | POA: Diagnosis not present

## 2023-09-20 DIAGNOSIS — I10 Essential (primary) hypertension: Secondary | ICD-10-CM | POA: Diagnosis not present

## 2023-09-21 DIAGNOSIS — Z7982 Long term (current) use of aspirin: Secondary | ICD-10-CM | POA: Diagnosis not present

## 2023-09-21 DIAGNOSIS — Z510 Encounter for antineoplastic radiation therapy: Secondary | ICD-10-CM | POA: Diagnosis not present

## 2023-09-21 DIAGNOSIS — Z794 Long term (current) use of insulin: Secondary | ICD-10-CM | POA: Diagnosis not present

## 2023-09-21 DIAGNOSIS — I10 Essential (primary) hypertension: Secondary | ICD-10-CM | POA: Diagnosis not present

## 2023-09-21 DIAGNOSIS — C50511 Malignant neoplasm of lower-outer quadrant of right female breast: Secondary | ICD-10-CM | POA: Diagnosis not present

## 2023-09-21 DIAGNOSIS — E119 Type 2 diabetes mellitus without complications: Secondary | ICD-10-CM | POA: Diagnosis not present

## 2023-09-21 DIAGNOSIS — Z79899 Other long term (current) drug therapy: Secondary | ICD-10-CM | POA: Diagnosis not present

## 2023-09-21 DIAGNOSIS — Z170 Estrogen receptor positive status [ER+]: Secondary | ICD-10-CM | POA: Diagnosis not present

## 2023-09-23 DIAGNOSIS — Z79899 Other long term (current) drug therapy: Secondary | ICD-10-CM | POA: Diagnosis not present

## 2023-09-23 DIAGNOSIS — Z794 Long term (current) use of insulin: Secondary | ICD-10-CM | POA: Diagnosis not present

## 2023-09-23 DIAGNOSIS — E119 Type 2 diabetes mellitus without complications: Secondary | ICD-10-CM | POA: Diagnosis not present

## 2023-09-23 DIAGNOSIS — Z510 Encounter for antineoplastic radiation therapy: Secondary | ICD-10-CM | POA: Diagnosis not present

## 2023-09-23 DIAGNOSIS — Z170 Estrogen receptor positive status [ER+]: Secondary | ICD-10-CM | POA: Diagnosis not present

## 2023-09-23 DIAGNOSIS — C50511 Malignant neoplasm of lower-outer quadrant of right female breast: Secondary | ICD-10-CM | POA: Diagnosis not present

## 2023-09-23 DIAGNOSIS — Z7982 Long term (current) use of aspirin: Secondary | ICD-10-CM | POA: Diagnosis not present

## 2023-09-23 DIAGNOSIS — I10 Essential (primary) hypertension: Secondary | ICD-10-CM | POA: Diagnosis not present

## 2023-09-23 DIAGNOSIS — D0511 Intraductal carcinoma in situ of right breast: Secondary | ICD-10-CM | POA: Diagnosis not present

## 2023-09-24 ENCOUNTER — Other Ambulatory Visit: Payer: Self-pay | Admitting: Genetic Counselor

## 2023-09-24 ENCOUNTER — Inpatient Hospital Stay: Payer: Medicare Other

## 2023-09-24 ENCOUNTER — Inpatient Hospital Stay: Payer: Medicare Other | Attending: Oncology | Admitting: Genetic Counselor

## 2023-09-24 ENCOUNTER — Telehealth: Payer: Self-pay

## 2023-09-24 ENCOUNTER — Encounter: Payer: Self-pay | Admitting: Genetic Counselor

## 2023-09-24 DIAGNOSIS — C801 Malignant (primary) neoplasm, unspecified: Secondary | ICD-10-CM

## 2023-09-24 DIAGNOSIS — Z800 Family history of malignant neoplasm of digestive organs: Secondary | ICD-10-CM

## 2023-09-24 DIAGNOSIS — Z170 Estrogen receptor positive status [ER+]: Secondary | ICD-10-CM | POA: Diagnosis not present

## 2023-09-24 DIAGNOSIS — D0511 Intraductal carcinoma in situ of right breast: Secondary | ICD-10-CM

## 2023-09-24 DIAGNOSIS — Z510 Encounter for antineoplastic radiation therapy: Secondary | ICD-10-CM | POA: Diagnosis not present

## 2023-09-24 DIAGNOSIS — C50511 Malignant neoplasm of lower-outer quadrant of right female breast: Secondary | ICD-10-CM

## 2023-09-24 DIAGNOSIS — E119 Type 2 diabetes mellitus without complications: Secondary | ICD-10-CM | POA: Diagnosis not present

## 2023-09-24 DIAGNOSIS — Z7982 Long term (current) use of aspirin: Secondary | ICD-10-CM | POA: Diagnosis not present

## 2023-09-24 DIAGNOSIS — Z79899 Other long term (current) drug therapy: Secondary | ICD-10-CM | POA: Diagnosis not present

## 2023-09-24 DIAGNOSIS — K861 Other chronic pancreatitis: Secondary | ICD-10-CM

## 2023-09-24 DIAGNOSIS — Z803 Family history of malignant neoplasm of breast: Secondary | ICD-10-CM | POA: Diagnosis not present

## 2023-09-24 DIAGNOSIS — Z794 Long term (current) use of insulin: Secondary | ICD-10-CM | POA: Diagnosis not present

## 2023-09-24 DIAGNOSIS — C50911 Malignant neoplasm of unspecified site of right female breast: Secondary | ICD-10-CM | POA: Diagnosis not present

## 2023-09-24 DIAGNOSIS — I10 Essential (primary) hypertension: Secondary | ICD-10-CM | POA: Diagnosis not present

## 2023-09-24 NOTE — Telephone Encounter (Signed)
-----   Message from Jasmine Davila sent at 09/21/2023 12:33 PM EDT ----- Regarding: RE: XRT Yes, she should do CT scans, and I think I ordered DEXA also.  Then I will see her 1 month after she finishes XRT to start hormonal therapy. Thanks ----- Message ----- From: Jasmine Dustman, RN Sent: 09/21/2023  10:25 AM EDT To: Jasmine Beckwith, MD Subject: RE: XRT                                        Yes she is doing XRT and will finish on 10/07/2023 baring not machine issues.  She does have a CT scan scheduled for 10/07/2023 for her lymphoma, should she keep that or reschedule? ----- Message ----- From: Jasmine Beckwith, MD Sent: 09/21/2023   7:05 AM EDT To: Jasmine Corpus, LPN; Jasmine Dustman, RN Subject: XRT                                            Is she on radiation?  When does she complete?

## 2023-09-24 NOTE — Telephone Encounter (Signed)
-----   Message from Dellia Beckwith sent at 09/21/2023 12:33 PM EDT ----- Regarding: RE: XRT Yes, she should do CT scans, and I think I ordered DEXA also.  Then I will see her 1 month after she finishes XRT to start hormonal therapy. Thanks ----- Message ----- From: Dyane Dustman, RN Sent: 09/21/2023  10:25 AM EDT To: Dellia Beckwith, MD Subject: RE: XRT                                        Yes she is doing XRT and will finish on 10/07/2023 baring not machine issues.  She does have a CT scan scheduled for 10/07/2023 for her lymphoma, should she keep that or reschedule? ----- Message ----- From: Dellia Beckwith, MD Sent: 09/21/2023   7:05 AM EDT To: Jeannette Corpus, LPN; Dyane Dustman, RN Subject: XRT                                            Is she on radiation?  When does she complete?

## 2023-09-27 DIAGNOSIS — Z510 Encounter for antineoplastic radiation therapy: Secondary | ICD-10-CM | POA: Diagnosis not present

## 2023-09-27 DIAGNOSIS — Z7982 Long term (current) use of aspirin: Secondary | ICD-10-CM | POA: Diagnosis not present

## 2023-09-27 DIAGNOSIS — Z170 Estrogen receptor positive status [ER+]: Secondary | ICD-10-CM | POA: Diagnosis not present

## 2023-09-27 DIAGNOSIS — I10 Essential (primary) hypertension: Secondary | ICD-10-CM | POA: Diagnosis not present

## 2023-09-27 DIAGNOSIS — C50511 Malignant neoplasm of lower-outer quadrant of right female breast: Secondary | ICD-10-CM | POA: Diagnosis not present

## 2023-09-27 DIAGNOSIS — E119 Type 2 diabetes mellitus without complications: Secondary | ICD-10-CM | POA: Diagnosis not present

## 2023-09-27 DIAGNOSIS — Z794 Long term (current) use of insulin: Secondary | ICD-10-CM | POA: Diagnosis not present

## 2023-09-27 DIAGNOSIS — Z79899 Other long term (current) drug therapy: Secondary | ICD-10-CM | POA: Diagnosis not present

## 2023-09-28 ENCOUNTER — Encounter: Payer: Self-pay | Admitting: Genetic Counselor

## 2023-09-28 DIAGNOSIS — C50511 Malignant neoplasm of lower-outer quadrant of right female breast: Secondary | ICD-10-CM | POA: Diagnosis not present

## 2023-09-28 DIAGNOSIS — Z170 Estrogen receptor positive status [ER+]: Secondary | ICD-10-CM | POA: Diagnosis not present

## 2023-09-28 DIAGNOSIS — Z794 Long term (current) use of insulin: Secondary | ICD-10-CM | POA: Diagnosis not present

## 2023-09-28 DIAGNOSIS — E119 Type 2 diabetes mellitus without complications: Secondary | ICD-10-CM | POA: Diagnosis not present

## 2023-09-28 DIAGNOSIS — I10 Essential (primary) hypertension: Secondary | ICD-10-CM | POA: Diagnosis not present

## 2023-09-28 DIAGNOSIS — Z7982 Long term (current) use of aspirin: Secondary | ICD-10-CM | POA: Diagnosis not present

## 2023-09-28 DIAGNOSIS — Z510 Encounter for antineoplastic radiation therapy: Secondary | ICD-10-CM | POA: Diagnosis not present

## 2023-09-28 DIAGNOSIS — Z79899 Other long term (current) drug therapy: Secondary | ICD-10-CM | POA: Diagnosis not present

## 2023-09-28 NOTE — Progress Notes (Signed)
REFERRING PROVIDER: Dellia Beckwith, MD [address] [place],  [place] [address]  PRIMARY PROVIDER:  Sandre Kitty, MD  PRIMARY REASON FOR VISIT:  1. Malignant neoplasm of lower-outer quadrant of right breast of female, estrogen receptor positive (HCC)   2. Ductal carcinoma in situ (DCIS) of right breast   3. H/o Cancer:   B- cell lymphoma (spleen and lung) - non-Hodgkin's   4. Other chronic pancreatitis (HCC)   5. Family history of breast cancer   6. Family history of colon cancer      HISTORY OF PRESENT ILLNESS:   Ms. Bergey, a 72 y.o. female, was seen for a [place] cancer genetics consultation at the request of Dr. Constance Goltz due to a personal history of breast cancer.  Ms. Ritter presents to clinic today to discuss the possibility of a hereditary predisposition to cancer, to discuss genetic testing, and to further clarify her future cancer risks, as well as potential cancer risks for family members.   In April 2021, at the age of 40, Ms. Brasher was diagnosed with ductal carcinoma in situ of the right breast (ER+/PR+). The treatment plan included lumpectomy.  She declined chemoprevention. In June 2024, she was diagnosed with  a second breast cancer--- invasive ductal carcinoma of the right breast (ER+/PR-/HER2-) s/p lumpectomy.    She also has a history of stage IV diffuse large B-cell lymphoma diagnosed in May 2016 and was treated with six cycles of R-CHOP with a complete response.  As of September 2024, she has no evidence of recurrence.  She denies any history of bone marrow transplant. In addition, she reported a history of chronic pancreatitis of unknown etiology.   CANCER HISTORY:  Oncology History  Ductal carcinoma in situ (DCIS) of right breast  04/10/2020 Cancer Staging   Staging form: Breast, AJCC 8th Edition - Clinical stage from 04/10/2020: Stage 0 (cTis (DCIS), cN0, cM0, G2, ER+, PR+, HER2: Not Assessed) - Signed by Dellia Beckwith, MD on  02/07/2022 Histopathologic type: Intraductal carcinoma, noninfiltrating, NOS Stage prefix: Initial diagnosis Histologic grading system: 3 grade system Stage used in treatment planning: Yes National guidelines used in treatment planning: Yes Type of national guideline used in treatment planning: NCCN   06/14/2020 Initial Diagnosis   Ductal carcinoma in situ (DCIS) of right breast   Breast cancer, right breast (HCC)  06/11/2023 Initial Diagnosis   Breast cancer, right breast (HCC)   06/11/2023 Cancer Staging   Staging form: Breast, AJCC 8th Edition - Clinical stage from 06/11/2023: Stage IA (cT1c, cN0, cM0, G2, ER+, PR-, HER2-) - Signed by Dellia Beckwith, MD on 09/21/2023 Histopathologic type: Infiltrating duct carcinoma, NOS Stage prefix: Initial diagnosis Method of lymph node assessment: Clinical Nuclear grade: G2 Histologic grading system: 3 grade system   07/19/2023 Cancer Staging   Staging form: Breast, AJCC 8th Edition - Pathologic stage from 07/19/2023: Stage IA (pT1, pN0(sn), cM0, G2, ER+, PR-, HER2-) - Signed by Dellia Beckwith, MD on 09/21/2023 Histopathologic type: Infiltrating duct carcinoma, NOS Stage prefix: Initial diagnosis Method of lymph node assessment: Sentinel lymph node biopsy Nuclear grade: G2 Multigene prognostic tests performed: EndoPredict Histologic grading system: 3 grade system Residual tumor (R): R0 - None Laterality: Right Tumor size (mm): 15 Lymph-vascular invasion (LVI): LVI not present (absent)/not identified Diagnostic confirmation: Positive histology PLUS positive immunophenotyping and/or positive genetic studies Specimen type: Excision Staged by: Managing physician Menopausal status: Postmenopausal Circulating tumor cells (CTC): Unknown Disseminated tumor cells: Unknown Ki-67 (%): 30 EndoPredict EPclin risk  score: 2.7 EndoPredict EPclin risk level: Low risk EndoPredict 10-year percentage risk of distant recurrence (%):  5.6 EndoPredict 10-year risk of distant recurrence: Low risk Stage used in treatment planning: Yes National guidelines used in treatment planning: Yes Type of national guideline used in treatment planning: NCCN Staging comments: Radiation and hormonal therapy       Past Medical History:  Diagnosis Date   Abnormal EKG    hx of right bundle branch block   Acute pancreatitis    HX OF   Anemia yrs ago   Breast cancer (HCC) 2021   Cancer (HCC) dx 2016   B cell lymphoma- Non Hodgkins in remission   Diabetes mellitus (HCC)    TYPE 2   Diabetic neuropathy (HCC)    Diabetic neuropathy (HCC)    Endometrial disorder    GERD (gastroesophageal reflux disease)    Headache    tension or sinus   Hypertension    Malignant lymphoma (HCC)    Morbid obesity (HCC)    Peripheral neuropathy    BOTH FEET, SLIGHT NEUROPATHY IN HANDS   Sleep apnea    USES CPAP at times   Vitamin B 12 deficiency     Past Surgical History:  Procedure Laterality Date   BREAST LUMPECTOMY Right 2021   CHOLECYSTECTOMY  03/06/1996   colomscopy     DILATATION & CURETTAGE/HYSTEROSCOPY WITH MYOSURE N/A 07/04/2018   Procedure: DILATATION & CURETTAGE/HYSTEROSCOPY WITH MYOSURE;  Surgeon: Romualdo Bolk, MD;  Location: [facility];  Service: Gynecology;  Laterality: N/A;   MEDIPORT INSERTION, DOUBLE     MEDIPORT REMOVAL     TOTAL KNEE ARTHROPLASTY Right 06/08/2022   Procedure: RIGHT TOTAL KNEE REPLACEMENT;  Surgeon: Tarry Kos, MD;  Location: [facility] OR;  Service: Orthopedics;  Laterality: Right;    FAMILY HISTORY:  We obtained a detailed, 4-generation family history.  Significant diagnoses are listed below: Family History  Problem Relation Age of Onset   Colon cancer Mother 63   Breast cancer Other        MGM's sister; dx 81s    Ms. Tarkington is unaware of previous family history of genetic testing for hereditary cancer risks. Patient's maternal ancestors are of [place] descent, and paternal  ancestors are of [place] descent. There is no reported Ashkenazi Jewish ancestry. There is no known consanguinity.  GENETIC COUNSELING ASSESSMENT: Ms. Foote is a 72 y.o. female with a personal history of two breast cancers, which is somewhat suggestive of a hereditary cancer syndrome. We, therefore, discussed and recommended the following at today's visit.   DISCUSSION: We discussed that 5 - 10% of cancer is hereditary.  Most cases of hereditary breast cancer are associated with mutations in BRCA1/2.  There are other genes that can be associated with hereditary breast, colon, or other cancer syndromes.  We discussed that testing is beneficial for several reasons including knowing how to follow individuals for their cancer risks and understanding if other family members could be at risk for cancer and allowing them to undergo genetic testing.   We reviewed the characteristics, features and inheritance patterns of hereditary cancer syndromes. We also discussed genetic testing, including the appropriate family members to test, the process of testing, insurance coverage and turn-around-time for results. We discussed the implications of a negative, positive, carrier and/or variant of uncertain significant result. We recommended Ms. Mcginty pursue genetic testing for a panel that includes genes associated with breast cancer, colon cancer, and chronic pancreatitis.   Ms. Berggren  was offered a common hereditary cancer panel (~40 genes) and an expanded pan-cancer panel (~40 genes). Ms. Vandevander was informed of the benefits and limitations of each panel, including that expanded pan-cancer panels contain genes that do not have clear management guidelines at this point in time.  We also discussed that as the number of genes included on a panel increases, the chances of variants of uncertain significance increases.  After considering the benefits and limitations of each gene panel, Ms. Moorefield  elected to have a common  hereditary cancers panel through [facility].  The CustomNext-Cancer+RNAinsight panel offered by [facility] includes sequencing, rearrangement, and RNA analysis for the following 39 genes (Common Hereditary + Chronic Pancreatitis):  APC, ATM, AXIN2, BARD1, BMPR1A, BRCA1, BRCA2, BRIP1, CASR, CDH1, CDK4, CDKN2A, CHEK2, CPA1, CTRC, DICER1, EPCAM, GREM1, HOXB13, MLH1, MSH2, MSH3, MSH6, MUTYH, NF1, NTHL1, PALB2, PMS2, POLD1, POLE, PRSS1, PTEN, RAD51C, RAD51D, SMAD4, SMARCA4, SPINK1, STK11, TP53.   Based on Ms. Kutzer personal history of two breast cancers (along with a third degree relative with breast cancer in her 74s), she meets NCCN medical criteria for genetic testing. Despite that she meets criteria, she may still have an out of pocket cost. We discussed that if her out of pocket cost for testing is over $100, the laboratory should contact her and discuss the self-pay prices and/or patient pay assistance programs.    PLAN: After considering the risks, benefits, and limitations, Ms. Free provided informed consent to pursue genetic testing and the blood sample was sent to [facility] for analysis of the CustomNext-Cancer +RNAinsight panel. Results should be available within approximately 2-3 weeks' time, at which point they will be disclosed by telephone to Ms. Shrewsbury, as will any additional recommendations warranted by these results. Ms. Kampf will receive a summary of her genetic counseling visit and a copy of her results once available. This information will also be available in Epic.    Ms. Verhoeven questions were answered to her satisfaction today. Our contact information was provided should additional questions or concerns arise. Thank you for the referral and allowing [place] to share in the care of your patient.   Lyndall Bellot M. Rennie Plowman, MS, [facility] Genetic Counselor [email or web address] (P) [phone]  The patient was seen for a total of 30 minutes in face-to-face genetic  counseling. The patient was seen alone.  Dr. Gilman Buttner was available to discuss this case as needed.    _______________________________________________________________________ For Office Staff:  Number of people involved in session: 1 Was an Intern/ student involved with case: no

## 2023-09-29 DIAGNOSIS — C8510 Unspecified B-cell lymphoma, unspecified site: Secondary | ICD-10-CM | POA: Diagnosis not present

## 2023-09-29 DIAGNOSIS — C801 Malignant (primary) neoplasm, unspecified: Secondary | ICD-10-CM | POA: Diagnosis not present

## 2023-09-29 DIAGNOSIS — Z794 Long term (current) use of insulin: Secondary | ICD-10-CM | POA: Diagnosis not present

## 2023-09-29 DIAGNOSIS — I10 Essential (primary) hypertension: Secondary | ICD-10-CM | POA: Diagnosis not present

## 2023-09-29 DIAGNOSIS — I700 Atherosclerosis of aorta: Secondary | ICD-10-CM | POA: Diagnosis not present

## 2023-09-29 DIAGNOSIS — K5730 Diverticulosis of large intestine without perforation or abscess without bleeding: Secondary | ICD-10-CM | POA: Diagnosis not present

## 2023-09-29 DIAGNOSIS — Z170 Estrogen receptor positive status [ER+]: Secondary | ICD-10-CM | POA: Diagnosis not present

## 2023-09-29 DIAGNOSIS — Z510 Encounter for antineoplastic radiation therapy: Secondary | ICD-10-CM | POA: Diagnosis not present

## 2023-09-29 DIAGNOSIS — Z7982 Long term (current) use of aspirin: Secondary | ICD-10-CM | POA: Diagnosis not present

## 2023-09-29 DIAGNOSIS — J9811 Atelectasis: Secondary | ICD-10-CM | POA: Diagnosis not present

## 2023-09-29 DIAGNOSIS — C50511 Malignant neoplasm of lower-outer quadrant of right female breast: Secondary | ICD-10-CM | POA: Diagnosis not present

## 2023-09-29 DIAGNOSIS — Z79899 Other long term (current) drug therapy: Secondary | ICD-10-CM | POA: Diagnosis not present

## 2023-09-29 DIAGNOSIS — E119 Type 2 diabetes mellitus without complications: Secondary | ICD-10-CM | POA: Diagnosis not present

## 2023-09-29 LAB — HEPATIC FUNCTION PANEL
ALT: 23 U/L (ref 7–35)
AST: 42 — AB (ref 13–35)
Alkaline Phosphatase: 85 (ref 25–125)
Bilirubin, Total: 0.5

## 2023-09-29 LAB — COMPREHENSIVE METABOLIC PANEL
Albumin: 4.1 (ref 3.5–5.0)
Calcium: 9.8 (ref 8.7–10.7)

## 2023-09-29 LAB — BASIC METABOLIC PANEL
BUN: 13 (ref 4–21)
CO2: 32 — AB (ref 13–22)
Chloride: 99 (ref 99–108)
Creatinine: 0.6 (ref 0.5–1.1)
Glucose: 128
Potassium: 4.2 meq/L (ref 3.5–5.1)
Sodium: 134 — AB (ref 137–147)

## 2023-09-30 DIAGNOSIS — C50511 Malignant neoplasm of lower-outer quadrant of right female breast: Secondary | ICD-10-CM | POA: Diagnosis not present

## 2023-09-30 DIAGNOSIS — D0511 Intraductal carcinoma in situ of right breast: Secondary | ICD-10-CM | POA: Diagnosis not present

## 2023-09-30 DIAGNOSIS — I10 Essential (primary) hypertension: Secondary | ICD-10-CM | POA: Diagnosis not present

## 2023-09-30 DIAGNOSIS — Z794 Long term (current) use of insulin: Secondary | ICD-10-CM | POA: Diagnosis not present

## 2023-09-30 DIAGNOSIS — Z510 Encounter for antineoplastic radiation therapy: Secondary | ICD-10-CM | POA: Diagnosis not present

## 2023-09-30 DIAGNOSIS — E119 Type 2 diabetes mellitus without complications: Secondary | ICD-10-CM | POA: Diagnosis not present

## 2023-09-30 DIAGNOSIS — Z7982 Long term (current) use of aspirin: Secondary | ICD-10-CM | POA: Diagnosis not present

## 2023-09-30 DIAGNOSIS — Z170 Estrogen receptor positive status [ER+]: Secondary | ICD-10-CM | POA: Diagnosis not present

## 2023-09-30 DIAGNOSIS — Z79899 Other long term (current) drug therapy: Secondary | ICD-10-CM | POA: Diagnosis not present

## 2023-10-01 DIAGNOSIS — E119 Type 2 diabetes mellitus without complications: Secondary | ICD-10-CM | POA: Diagnosis not present

## 2023-10-01 DIAGNOSIS — Z510 Encounter for antineoplastic radiation therapy: Secondary | ICD-10-CM | POA: Diagnosis not present

## 2023-10-01 DIAGNOSIS — Z7982 Long term (current) use of aspirin: Secondary | ICD-10-CM | POA: Diagnosis not present

## 2023-10-01 DIAGNOSIS — Z170 Estrogen receptor positive status [ER+]: Secondary | ICD-10-CM | POA: Diagnosis not present

## 2023-10-01 DIAGNOSIS — I10 Essential (primary) hypertension: Secondary | ICD-10-CM | POA: Diagnosis not present

## 2023-10-01 DIAGNOSIS — Z79899 Other long term (current) drug therapy: Secondary | ICD-10-CM | POA: Diagnosis not present

## 2023-10-01 DIAGNOSIS — C50511 Malignant neoplasm of lower-outer quadrant of right female breast: Secondary | ICD-10-CM | POA: Diagnosis not present

## 2023-10-01 DIAGNOSIS — Z794 Long term (current) use of insulin: Secondary | ICD-10-CM | POA: Diagnosis not present

## 2023-10-04 ENCOUNTER — Other Ambulatory Visit: Payer: Self-pay | Admitting: Hematology and Oncology

## 2023-10-04 DIAGNOSIS — C50511 Malignant neoplasm of lower-outer quadrant of right female breast: Secondary | ICD-10-CM | POA: Diagnosis not present

## 2023-10-04 DIAGNOSIS — E119 Type 2 diabetes mellitus without complications: Secondary | ICD-10-CM | POA: Diagnosis not present

## 2023-10-04 DIAGNOSIS — Z794 Long term (current) use of insulin: Secondary | ICD-10-CM | POA: Diagnosis not present

## 2023-10-04 DIAGNOSIS — Z170 Estrogen receptor positive status [ER+]: Secondary | ICD-10-CM | POA: Diagnosis not present

## 2023-10-04 DIAGNOSIS — I10 Essential (primary) hypertension: Secondary | ICD-10-CM | POA: Diagnosis not present

## 2023-10-04 DIAGNOSIS — F411 Generalized anxiety disorder: Secondary | ICD-10-CM

## 2023-10-04 DIAGNOSIS — Z510 Encounter for antineoplastic radiation therapy: Secondary | ICD-10-CM | POA: Diagnosis not present

## 2023-10-04 DIAGNOSIS — Z7982 Long term (current) use of aspirin: Secondary | ICD-10-CM | POA: Diagnosis not present

## 2023-10-04 DIAGNOSIS — Z79899 Other long term (current) drug therapy: Secondary | ICD-10-CM | POA: Diagnosis not present

## 2023-10-05 ENCOUNTER — Ambulatory Visit (INDEPENDENT_AMBULATORY_CARE_PROVIDER_SITE_OTHER): Payer: Medicare Other

## 2023-10-05 ENCOUNTER — Ambulatory Visit: Payer: Medicare Other | Admitting: Podiatry

## 2023-10-05 VITALS — BP 138/69 | HR 74

## 2023-10-05 DIAGNOSIS — M069 Rheumatoid arthritis, unspecified: Secondary | ICD-10-CM

## 2023-10-05 DIAGNOSIS — Z794 Long term (current) use of insulin: Secondary | ICD-10-CM | POA: Diagnosis not present

## 2023-10-05 DIAGNOSIS — M7751 Other enthesopathy of right foot: Secondary | ICD-10-CM | POA: Diagnosis not present

## 2023-10-05 DIAGNOSIS — Z7982 Long term (current) use of aspirin: Secondary | ICD-10-CM | POA: Diagnosis not present

## 2023-10-05 DIAGNOSIS — M7752 Other enthesopathy of left foot: Secondary | ICD-10-CM | POA: Diagnosis not present

## 2023-10-05 DIAGNOSIS — C50511 Malignant neoplasm of lower-outer quadrant of right female breast: Secondary | ICD-10-CM | POA: Diagnosis not present

## 2023-10-05 DIAGNOSIS — E119 Type 2 diabetes mellitus without complications: Secondary | ICD-10-CM | POA: Diagnosis not present

## 2023-10-05 DIAGNOSIS — Z79899 Other long term (current) drug therapy: Secondary | ICD-10-CM | POA: Diagnosis not present

## 2023-10-05 DIAGNOSIS — Z510 Encounter for antineoplastic radiation therapy: Secondary | ICD-10-CM | POA: Diagnosis not present

## 2023-10-05 DIAGNOSIS — Z170 Estrogen receptor positive status [ER+]: Secondary | ICD-10-CM | POA: Diagnosis not present

## 2023-10-05 DIAGNOSIS — I10 Essential (primary) hypertension: Secondary | ICD-10-CM | POA: Diagnosis not present

## 2023-10-06 ENCOUNTER — Encounter: Payer: Self-pay | Admitting: Podiatry

## 2023-10-06 DIAGNOSIS — Z170 Estrogen receptor positive status [ER+]: Secondary | ICD-10-CM | POA: Diagnosis not present

## 2023-10-06 DIAGNOSIS — Z79899 Other long term (current) drug therapy: Secondary | ICD-10-CM | POA: Diagnosis not present

## 2023-10-06 DIAGNOSIS — E119 Type 2 diabetes mellitus without complications: Secondary | ICD-10-CM | POA: Diagnosis not present

## 2023-10-06 DIAGNOSIS — Z794 Long term (current) use of insulin: Secondary | ICD-10-CM | POA: Diagnosis not present

## 2023-10-06 DIAGNOSIS — C50511 Malignant neoplasm of lower-outer quadrant of right female breast: Secondary | ICD-10-CM | POA: Diagnosis not present

## 2023-10-06 DIAGNOSIS — I10 Essential (primary) hypertension: Secondary | ICD-10-CM | POA: Diagnosis not present

## 2023-10-06 DIAGNOSIS — Z510 Encounter for antineoplastic radiation therapy: Secondary | ICD-10-CM | POA: Diagnosis not present

## 2023-10-06 DIAGNOSIS — Z7982 Long term (current) use of aspirin: Secondary | ICD-10-CM | POA: Diagnosis not present

## 2023-10-06 MED ORDER — ALPRAZOLAM 0.25 MG PO TABS: 0.2500 mg | ORAL_TABLET | Freq: Three times a day (TID) | ORAL | 0 refills | Status: DC | PRN

## 2023-10-07 DIAGNOSIS — I10 Essential (primary) hypertension: Secondary | ICD-10-CM | POA: Diagnosis not present

## 2023-10-07 DIAGNOSIS — Z79899 Other long term (current) drug therapy: Secondary | ICD-10-CM | POA: Diagnosis not present

## 2023-10-07 DIAGNOSIS — Z510 Encounter for antineoplastic radiation therapy: Secondary | ICD-10-CM | POA: Diagnosis not present

## 2023-10-07 DIAGNOSIS — Z170 Estrogen receptor positive status [ER+]: Secondary | ICD-10-CM | POA: Diagnosis not present

## 2023-10-07 DIAGNOSIS — C50511 Malignant neoplasm of lower-outer quadrant of right female breast: Secondary | ICD-10-CM | POA: Diagnosis not present

## 2023-10-07 DIAGNOSIS — D0511 Intraductal carcinoma in situ of right breast: Secondary | ICD-10-CM | POA: Diagnosis not present

## 2023-10-07 DIAGNOSIS — E119 Type 2 diabetes mellitus without complications: Secondary | ICD-10-CM | POA: Diagnosis not present

## 2023-10-07 DIAGNOSIS — Z7982 Long term (current) use of aspirin: Secondary | ICD-10-CM | POA: Diagnosis not present

## 2023-10-07 DIAGNOSIS — Z794 Long term (current) use of insulin: Secondary | ICD-10-CM | POA: Diagnosis not present

## 2023-10-08 DIAGNOSIS — Z79899 Other long term (current) drug therapy: Secondary | ICD-10-CM | POA: Diagnosis not present

## 2023-10-08 DIAGNOSIS — I10 Essential (primary) hypertension: Secondary | ICD-10-CM | POA: Diagnosis not present

## 2023-10-08 DIAGNOSIS — Z170 Estrogen receptor positive status [ER+]: Secondary | ICD-10-CM | POA: Diagnosis not present

## 2023-10-08 DIAGNOSIS — Z510 Encounter for antineoplastic radiation therapy: Secondary | ICD-10-CM | POA: Diagnosis not present

## 2023-10-08 DIAGNOSIS — Z794 Long term (current) use of insulin: Secondary | ICD-10-CM | POA: Diagnosis not present

## 2023-10-08 DIAGNOSIS — E119 Type 2 diabetes mellitus without complications: Secondary | ICD-10-CM | POA: Diagnosis not present

## 2023-10-08 DIAGNOSIS — Z7982 Long term (current) use of aspirin: Secondary | ICD-10-CM | POA: Diagnosis not present

## 2023-10-08 DIAGNOSIS — C50511 Malignant neoplasm of lower-outer quadrant of right female breast: Secondary | ICD-10-CM | POA: Diagnosis not present

## 2023-10-10 MED ORDER — MELOXICAM 15 MG PO TABS: 15.0000 mg | ORAL_TABLET | Freq: Every day | ORAL | 1 refills | Status: DC

## 2023-10-11 ENCOUNTER — Encounter: Payer: Self-pay | Admitting: Oncology

## 2023-10-13 ENCOUNTER — Telehealth: Payer: Self-pay | Admitting: Genetic Counselor

## 2023-10-13 ENCOUNTER — Ambulatory Visit: Payer: Self-pay | Admitting: Genetic Counselor

## 2023-10-13 ENCOUNTER — Encounter: Payer: Self-pay | Admitting: Genetic Counselor

## 2023-10-13 DIAGNOSIS — Z1379 Encounter for other screening for genetic and chromosomal anomalies: Secondary | ICD-10-CM | POA: Insufficient documentation

## 2023-10-13 DIAGNOSIS — M7752 Other enthesopathy of left foot: Secondary | ICD-10-CM | POA: Diagnosis not present

## 2023-10-13 DIAGNOSIS — Z803 Family history of malignant neoplasm of breast: Secondary | ICD-10-CM

## 2023-10-13 DIAGNOSIS — M7751 Other enthesopathy of right foot: Secondary | ICD-10-CM | POA: Diagnosis not present

## 2023-10-13 DIAGNOSIS — C50511 Malignant neoplasm of lower-outer quadrant of right female breast: Secondary | ICD-10-CM

## 2023-10-13 DIAGNOSIS — K861 Other chronic pancreatitis: Secondary | ICD-10-CM

## 2023-10-13 DIAGNOSIS — C801 Malignant (primary) neoplasm, unspecified: Secondary | ICD-10-CM

## 2023-10-13 DIAGNOSIS — Z800 Family history of malignant neoplasm of digestive organs: Secondary | ICD-10-CM

## 2023-10-13 DIAGNOSIS — D0511 Intraductal carcinoma in situ of right breast: Secondary | ICD-10-CM

## 2023-10-13 MED ORDER — BETAMETHASONE SOD PHOS & ACET 6 (3-3) MG/ML IJ SUSP
3.0000 mg | Freq: Once | INTRAMUSCULAR | Status: AC
Start: 2023-10-13 — End: 2023-10-13
  Administered 2023-10-13: 3 mg via INTRA_ARTICULAR

## 2023-10-13 NOTE — Telephone Encounter (Signed)
Contacted patient in attempt to disclose results of genetic testing.  LVM with contact information requesting a call back.  

## 2023-10-13 NOTE — Progress Notes (Signed)
HPI:   [name] was previously seen in the [facility] due to a personal and family history of breast cancer and concerns regarding a hereditary predisposition to cancer.    [name] recent genetic test results were disclosed to her by telephone. These results and recommendations are discussed in more detail below.  CANCER HISTORY:  Oncology History  Ductal carcinoma in situ (DCIS) of right breast  04/10/2020 Cancer Staging   Staging form: Breast, AJCC 8th Edition - Clinical stage from 04/10/2020: Stage 0 (cTis (DCIS), cN0, cM0, G2, ER+, PR+, HER2: Not Assessed) - Signed by Dellia Beckwith, MD on 02/07/2022 Histopathologic type: Intraductal carcinoma, noninfiltrating, NOS Stage prefix: Initial diagnosis Histologic grading system: 3 grade system Stage used in treatment planning: Yes National guidelines used in treatment planning: Yes Type of national guideline used in treatment planning: NCCN   06/14/2020 Initial Diagnosis   Ductal carcinoma in situ (DCIS) of right breast   10/10/2022 Genetic Testing   Negative [facility] CustomNext-Cancer +RNAinsight Panel.  Report date is 10/11/2023.   The CustomNext-Cancer+RNAinsight panel offered by [facility] includes sequencing, rearrangement, and RNA analysis for the following 39 genes:   APC, ATM, BARD1, BMPR1A, BRCA1, BRCA2, BRIP1, CDH1, CDK4, CDKN2A, CHEK2, DICER1, MLH1, MSH2, MSH6, MUTYH, NF1, NTHL1, PALB2, PMS2, PTEN, RAD51C, RAD51D, SMAD4, SMARCA4, STK11 and TP53 (sequencing and deletion/duplication); AXIN2, CASR, CPA1, CTRC, HOXB13, MSH3, POLD1, POLE, PRSS1 and SPINK1 (sequencing only); EPCAM and GREM1 (deletion/duplication only).   Breast cancer, right breast (HCC)  10/10/2022 Genetic Testing   Negative [facility] CustomNext-Cancer +RNAinsight Panel.  Report date is 10/11/2023.   The CustomNext-Cancer+RNAinsight panel offered by [facility] includes sequencing, rearrangement, and RNA analysis for the following 39  genes:   APC, ATM, BARD1, BMPR1A, BRCA1, BRCA2, BRIP1, CDH1, CDK4, CDKN2A, CHEK2, DICER1, MLH1, MSH2, MSH6, MUTYH, NF1, NTHL1, PALB2, PMS2, PTEN, RAD51C, RAD51D, SMAD4, SMARCA4, STK11 and TP53 (sequencing and deletion/duplication); AXIN2, CASR, CPA1, CTRC, HOXB13, MSH3, POLD1, POLE, PRSS1 and SPINK1 (sequencing only); EPCAM and GREM1 (deletion/duplication only).   06/11/2023 Initial Diagnosis   Breast cancer, right breast (HCC)   06/11/2023 Cancer Staging   Staging form: Breast, AJCC 8th Edition - Clinical stage from 06/11/2023: Stage IA (cT1c, cN0, cM0, G2, ER+, PR-, HER2-) - Signed by Dellia Beckwith, MD on 09/21/2023 Histopathologic type: Infiltrating duct carcinoma, NOS Stage prefix: Initial diagnosis Method of lymph node assessment: Clinical Nuclear grade: G2 Histologic grading system: 3 grade system   07/19/2023 Cancer Staging   Staging form: Breast, AJCC 8th Edition - Pathologic stage from 07/19/2023: Stage IA (pT1, pN0(sn), cM0, G2, ER+, PR-, HER2-) - Signed by Dellia Beckwith, MD on 09/21/2023 Histopathologic type: Infiltrating duct carcinoma, NOS Stage prefix: Initial diagnosis Method of lymph node assessment: Sentinel lymph node biopsy Nuclear grade: G2 Multigene prognostic tests performed: EndoPredict Histologic grading system: 3 grade system Residual tumor (R): R0 - None Laterality: Right Tumor size (mm): 15 Lymph-vascular invasion (LVI): LVI not present (absent)/not identified Diagnostic confirmation: Positive histology PLUS positive immunophenotyping and/or positive genetic studies Specimen type: Excision Staged by: Managing physician Menopausal status: Postmenopausal Circulating tumor cells (CTC): Unknown Disseminated tumor cells: Unknown Ki-67 (%): 30 EndoPredict EPclin risk score: 2.7 EndoPredict EPclin risk level: Low risk EndoPredict 10-year percentage risk of distant recurrence (%): 5.6 EndoPredict 10-year risk of distant recurrence: Low risk Stage used  in treatment planning: Yes National guidelines used in treatment planning: Yes Type of national guideline used in treatment planning: NCCN Staging comments: Radiation and hormonal therapy  FAMILY HISTORY:  We obtained a detailed, 4-generation family history.  Significant diagnoses are listed below:      Family History  Problem Relation Age of Onset   Colon cancer Mother 75   Breast cancer Other          MGM's sister; dx 21s     [name] is unaware of previous family history of genetic testing for hereditary cancer risks. Patient's maternal ancestors are of [place] descent, and paternal ancestors are of English descent. There is no reported Ashkenazi Jewish ancestry. There is no known consanguinity.  GENETIC TEST RESULTS:  The [facility] CustomNext-Cancer +RNAinsight Panel found no pathogenic mutations.   The CustomNext-Cancer+RNAinsight panel offered by [facility] includes sequencing, rearrangement, and RNA analysis for the following 39 genes:   APC, ATM, BARD1, BMPR1A, BRCA1, BRCA2, BRIP1, CDH1, CDK4, CDKN2A, CHEK2, DICER1, MLH1, MSH2, MSH6, MUTYH, NF1, NTHL1, PALB2, PMS2, PTEN, RAD51C, RAD51D, SMAD4, SMARCA4, STK11 and TP53 (sequencing and deletion/duplication); AXIN2, CASR, CPA1, CTRC, HOXB13, MSH3, POLD1, POLE, PRSS1 and SPINK1 (sequencing only); EPCAM and GREM1 (deletion/duplication only).  The test report has been scanned into EPIC and is located under the Molecular Pathology section of the Results Review tab.  A portion of the result report is included below for reference. Genetic testing reported out on October 11, 2023.     Genetic testing did identify three variants of uncertain significance (VUS) - one in the BRCA2 gene called  p.I2003L (c.6007A>C) , a second in the MSH3 gene called p.M953L (c.2857A>T), and a third in the NF1 gene called  p.R601Q (c.1802G>A).  At this time, it is unknown if these variants are associated with increased cancer risk or if they are normal  findings, but most variants such as these get reclassified to being inconsequential. They should not be used to make medical management decisions. With time, we suspect the lab will determine the significance of these variants, if any. If we do learn more about them, we will try to contact [name] to discuss it further. However, it is important to stay in touch with [place] periodically and keep the address and phone number up to date.   Even though a pathogenic variant was not identified, possible explanations for the cancer in the family may include: There may be no hereditary risk for cancer in the family. The cancers in [name] and/or her family may be sporadic/familial or due to other genetic and environmental factors. There may be a gene mutation in one of these genes that current testing methods cannot detect but that chance is small. There could be another gene that has not yet been discovered, or that we have not yet tested, that is responsible for the cancer diagnoses in the family.  It is also possible there is a hereditary cause for the cancer in the family that [name] did not inherit. The variant of uncertain significance detected in the BRCA2 gene may be reclassified as a pathogenic variant in the future. At this time, we do not know if this variant increases the risk for cancer.  Therefore, it is important to remain in touch with cancer genetics in the future so that we can continue to offer [name] the most up to date genetic testing.    ADDITIONAL GENETIC TESTING:   [name] genetic testing was fairly extensive.  If there are additional relevant genes identified to increase cancer risk that can be analyzed in the future, we would be happy to discuss and coordinate this testing  at that time.     CANCER SCREENING RECOMMENDATIONS:  [name] test result is considered negative (normal).  This means that we have not identified a hereditary cause for her personal history  of cancer at this time.   An individual's cancer risk and medical management are not determined by genetic test results alone. Overall cancer risk assessment incorporates additional factors, including personal medical history, family history, and any available genetic information that may result in a personalized plan for cancer prevention and surveillance. Therefore, it is recommended she continue to follow the cancer management and screening guidelines provided by her oncology and primary healthcare provider.  Based on the reported personal and family history, specific cancer screenings for [name] include: -Colonoscopies at least every 5 years, or as recommended by a GI providers, given her mother's history of colon cancer   RECOMMENDATIONS FOR FAMILY MEMBERS:   Since she did not inherit a identifiable mutation in a cancer predisposition gene included on this panel, her children could not have inherited a known mutation from her in one of these genes. Individuals in this family might be at some increased risk of developing cancer, over the general population risk, due to the family history of cancer.  Individuals in the family should notify their providers of the family history of cancer. We recommend women in this family have a yearly mammogram beginning at age 92, or 32 years younger than the earliest onset of cancer, an annual clinical breast exam, and perform monthly breast self-exams.  Risk models that take into account family history and hormonal history may be helpful in determining appropriate breast cancer screening options for family members.  First degree relatives of those with colon cancer should receive colonoscopies beginning at age 58, or 10 years prior to the earliest diagnosis of colon cancer in the family, and receive colonoscopies at least every 5 years, or as recommended by their gastroenterologist.   We do not recommend familial testing for the BRCA2, NF1, or  MSH3 variants of uncertain significance (VUS).  FOLLOW-UP:  Cancer genetics is a rapidly advancing field and it is possible that new genetic tests will be appropriate for her and/or her family members in the future. We encourage [name] to remain in contact with cancer genetics, so we can update her personal and family histories and let her know of advances in cancer genetics that may benefit this family.   Our contact number was provided.  They are welcome to call us at anytime with additional questions or concerns.   Jasmine Davila M. Rennie Plowman, MS, [facility] Genetic Counselor [email or web address] (P) [phone]

## 2023-10-13 NOTE — Telephone Encounter (Signed)
Disclosed negative genetics and VUS results

## 2023-10-13 NOTE — Progress Notes (Signed)
Chief Complaint  Patient presents with   Arthritis    "I have the pain from the arthritis in my ankles again."     HPI: 72 y.o. female presenting today for evaluation of bilateral ankle pain ongoing for several months.  Patient has history of rheumatoid arthritis.  She says that her ankles have slowly come more painful.  She has had injections in the past which helped significantly for several months.  Past Medical History:  Diagnosis Date   Abnormal EKG    hx of right bundle branch block   Acute pancreatitis    HX OF   Anemia yrs ago   Breast cancer (HCC) 2021   Cancer (HCC) dx 2016   B cell lymphoma- Non Hodgkins in remission   Diabetes mellitus (HCC)    TYPE 2   Diabetic neuropathy (HCC)    Diabetic neuropathy (HCC)    Endometrial disorder    GERD (gastroesophageal reflux disease)    Headache    tension or sinus   Hypertension    Malignant lymphoma (HCC)    Morbid obesity (HCC)    Peripheral neuropathy    BOTH FEET, SLIGHT NEUROPATHY IN HANDS   Sleep apnea    USES CPAP at times   Vitamin B 12 deficiency     Past Surgical History:  Procedure Laterality Date   BREAST LUMPECTOMY Right 2021   CHOLECYSTECTOMY  03/06/1996   colomscopy     DILATATION & CURETTAGE/HYSTEROSCOPY WITH MYOSURE N/A 07/04/2018   Procedure: DILATATION & CURETTAGE/HYSTEROSCOPY WITH MYOSURE;  Surgeon: Romualdo Bolk, MD;  Location: [facility];  Service: Gynecology;  Laterality: N/A;   MEDIPORT INSERTION, DOUBLE     MEDIPORT REMOVAL     TOTAL KNEE ARTHROPLASTY Right 06/08/2022   Procedure: RIGHT TOTAL KNEE REPLACEMENT;  Surgeon: Tarry Kos, MD;  Location: [facility] OR;  Service: Orthopedics;  Laterality: Right;    Allergies  Allergen Reactions   Doxycycline Other (See Comments)    Pancreatic issues   Hydromorphone Other (See Comments)   Metronidazole Other (See Comments)    Cream  Burned face   Dilaudid [Hydromorphone Hcl] Anxiety     Physical Exam: General: The  patient is alert and oriented x3 in no acute distress.  Dermatology: Skin is warm, dry and supple bilateral lower extremities.   Vascular: Palpable pedal pulses bilaterally. Capillary refill within normal limits.  No appreciable edema.  No erythema.  Neurological: Grossly intact via light touch  Musculoskeletal Exam: No pedal deformities noted.  There is some tenderness with palpation to the bilateral ankle joints.  Tenderness with range of motion as well.  No appreciable crepitus  Radiographic Exam B/L ankles 10/05/2023:  Normal osseous mineralization.  Tibiotalar joint congruent.  Joint spaces mostly preserved.  No acute fracture identified.  Impression: Negative  Assessment/Plan of Care: 1.  Chronic bilateral ankle pain  -Patient evaluated.  X-rays reviewed -Injection of 0.5 cc Celestone Soluspan injected into the bilateral ankles -Prescription for meloxicam 15 mg daily as needed -Continue wearing good supportive shoes and sneakers.  Advised against going barefoot -Return to clinic as needed     Jasmine Davila, DPM [facility]  Dr. Felecia Davila, DPM    [address]                                        [place],  [place] [address]                Office [phone]  Fax [phone]

## 2023-10-28 ENCOUNTER — Encounter: Payer: Self-pay | Admitting: Oncology

## 2023-10-28 NOTE — Progress Notes (Signed)
[facility]  [address] [place],  [place]  [address] [phone]  Clinic Day:  10/20//24   Referring physician: Sandre Kitty, MD  CHIEF COMPLAINT:  CC:  Stage IA hormone receptor positive breast cancer  Current Treatment:   Starting adjuvant hormonal therapy  HISTORY OF PRESENT ILLNESS:  Jasmine Davila is a 72 y.o. 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 Annual screening mammogram revealed an area of calcifications with in the right breast working further evaluation.  Diagnostic unilateral right mammogram in May confirmed an area of pleomorphic calcifications spanning 1.1 cm in the lower inner quadrant of the right breast.  Stereotactic biopsy revealed intermediate grade, ductal carcinoma in situ with necrosis.  Fibrocystic changes including florid and micropapillary usual ductal hyperplasia and micropapillary apocrine metaplasia were also seen.  No invasive carcinoma was identified.  Estrogen receptor was positive at 90% and progesterone receptor was positive at 95%.  She underwent lumpectomy in June.  Surgical pathology confirmed a residual 1 mm  ductal carcinoma in situ, intermediate grade.  No invasive carcinoma identified and margins were free of neoplasm. We recommended chemoprevention with raloxifene, however, she declined.  She also has a history of stage IV diffuse large B-cell lymphoma diagnosed in May 2016, treated with 6 cycles of R-CHOP with a complete response.  She has remained without evidence of recurrence.  She undergoes screening colonoscopy every 5 years due to a family history of colon cancer. He last colonoscopy was  in November 2020. Bone density scan in June 2020 was normal.  She has had pancreatitis on and off since 2008.  Annual mammography from May 2022 was clear. CT imaging of the chest, abdomen and pelvis from August 2022 revealed no evidence of  metastatic breast cancer or recurrence of lymphoma.   New stage IA (T1c N0 M0) invasive ductal carcinoma of the right breast diagnosed in June 2024.  There was a possible mass in the right breast on screening mammogram in June. Right diagnostic right mammogram and ultrasound in June revealed a 1.2 cm mass in the right lower quadrant. The right axilla was negative on ultrasound. Biopsy revealed grade 2, invasive ductal carcinoma. Estrogen and progesterone receptors were positive and HER2 negative. Ki67 was 30%. She was treated with in July. Pathology revealed 1.5 cm, grade 2,  invasive ductal carcinoma with ductal carcinoma in situ. Margins were negative. EndoPredict in August revealed a low risk Epclin score of 2.7 which corresponds with a 5.6% for recurrence in 0-10 years and a 1.3% for an estimated absolute chemotherapy benefit at 10 years, as well as a  likelihood of late distant recurrence in 5-15 years is 4.4%.   INTERVAL HISTORY:  Jasmine Davila is here to discussed adjuvant hormonal therapy for her new stage IA hormone receptor positive invasive ductal carcinoma of the right breast. She states she is feeling fairly well. She reports residual right nipple tenderness since radiation, but otherwise denies changes in her breasts. She denies fevers or chills. She denies pain. Her appetite is good and her weight has been stable.  She previously met with Dr. Gilman Buttner who recommended adjuvant hormonal therapy with anastrozole 1 mg daily for a total of 5 years.  She completed radiation to the right breast in October. She also met with with genetic counselor and underwent testing for hereditary cancer syndromes with the [facility] [employer] +RNA[employer] Panel in October. This did not reveal any  clinically significant mutation or variants of uncertain significance. She also had a CT chest, abdomen and pelvis in October, which did not reveal any evidences of recurrent lymphoma or other changes concerning for  malignancy. Bone density scan is scheduled for next week. She is alone today.  REVIEW OF SYSTEMS:  Review of Systems  Constitutional:  Negative for appetite change, chills, diaphoresis, fatigue, fever and unexpected weight change.  HENT:   Negative for lump/mass, mouth sores, nosebleeds, sore throat and trouble swallowing.   Eyes:  Negative for eye problems.  Respiratory:  Negative for cough and shortness of breath.   Cardiovascular:  Negative for chest pain, leg swelling and palpitations.  Gastrointestinal:  Negative for abdominal pain, blood in stool, constipation, diarrhea, nausea and vomiting.  Endocrine: Negative for hot flashes.  Genitourinary:  Negative for difficulty urinating, dysuria, frequency and hematuria.   Musculoskeletal:  Negative for arthralgias, back pain, gait problem and myalgias.  Skin:  Negative for itching and rash.  Neurological:  Negative for dizziness, gait problem, headaches and light-headedness.  Hematological:  Negative for adenopathy. Does not bruise/bleed easily.  Psychiatric/Behavioral:  Negative for depression and sleep disturbance. The patient is not nervous/anxious.     VITALS:  Blood pressure 122/89, pulse 76, temperature 98.5 F (36.9 C), temperature source Oral, resp. rate 18, height 5\' 7"  (1.702 m), weight 232 lb 9.6 oz (105.5 kg), last menstrual period 12/21/1998, SpO2 99%.  Wt Readings from Last 3 Encounters:  11/10/23 237 lb 9.6 oz (107.8 kg)  11/10/23 232 lb 9.6 oz (105.5 kg)  08/26/23 232 lb 6.4 oz (105.4 kg)    Body mass index is 36.43 kg/m.  Performance status (ECOG): 0 - Asymptomatic  PHYSICAL EXAM:  Physical Exam Vitals and nursing note reviewed.  Constitutional:      General: She is not in acute distress.    Appearance: Normal appearance. She is normal weight. She is not ill-appearing, toxic-appearing or diaphoretic.  HENT:     Head: Normocephalic and atraumatic.     Mouth/Throat:     Mouth: Mucous membranes are moist.      Pharynx: Oropharynx is clear. No oropharyngeal exudate or posterior oropharyngeal erythema.  Eyes:     General: No scleral icterus.       Right eye: No discharge.        Left eye: No discharge.     Extraocular Movements: Extraocular movements intact.     Conjunctiva/sclera: Conjunctivae normal.     Pupils: Pupils are equal, round, and reactive to light.  Cardiovascular:     Rate and Rhythm: Normal rate and regular rhythm.     Pulses: Normal pulses.     Heart sounds: Normal heart sounds. No murmur heard.    No friction rub. No gallop.  Pulmonary:     Effort: Pulmonary effort is normal. No respiratory distress.     Breath sounds: Normal breath sounds. No stridor. No wheezing, rhonchi or rales.  Chest:  Breasts:    Right: Normal. No inverted nipple, mass, nipple discharge or skin change.     Left: Normal. No inverted nipple, mass, nipple discharge or skin change.     Comments: Well healed incision right outer lower quadrant. Right nipple is mildly irritated and tender without discharge. Minor fibrocystic changes of the right breast.  Abdominal:     General: Bowel sounds are normal. There is no distension.     Palpations: Abdomen is soft. There is no hepatomegaly, splenomegaly or mass.     Tenderness: There  is no abdominal tenderness. There is no guarding or rebound.     Hernia: No hernia is present.  Musculoskeletal:        General: No swelling. Normal range of motion.     Cervical back: Normal range of motion and neck supple. No rigidity.     Right lower leg: No edema.     Left lower leg: No edema.  Lymphadenopathy:     Upper Body:     Right upper body: No supraclavicular or axillary adenopathy.     Left upper body: No supraclavicular or axillary adenopathy.  Skin:    General: Skin is warm and dry.     Coloration: Skin is not jaundiced.     Findings: No rash.  Neurological:     General: No focal deficit present.     Mental Status: She is alert and oriented to person, place, and  time. Mental status is at baseline.  Psychiatric:        Mood and Affect: Mood normal.        Behavior: Behavior normal.        Thought Content: Thought content normal.        Judgment: Judgment normal.    LABS:      Latest Ref Rng & Units 02/10/2023   11:07 AM 10/21/2022   10:52 AM 08/10/2022   12:00 AM  CBC  WBC 4.0 - 10.5 K/uL 4.9  5.3  4.4      Hemoglobin 12.0 - 15.0 g/dL 09.8  11.9  14.7      Hematocrit 36.0 - 46.0 % 40.6  40.7  38      Platelets 150 - 400 K/uL 153  181  155         This result is from an external source.      Latest Ref Rng & Units 09/29/2023   12:00 AM 06/09/2023   11:14 AM 02/10/2023   11:07 AM  CMP  Glucose 70 - 99 mg/dL  829  562   BUN 4 - 21 13     18  12    Creatinine 0.5 - 1.1 0.6     0.76  0.79   Sodium 137 - 147 134     139  134   Potassium 3.5 - 5.1 mEq/L 4.2     4.8  3.8   Chloride 99 - 108 99     100  97   CO2 13 - 22 32     25  29   Calcium 8.7 - 10.7 9.8     10.1  9.5   Total Protein 6.5 - 8.1 g/dL   7.4   Total Bilirubin 0.3 - 1.2 mg/dL   0.7   Alkaline Phos 25 - 125 85      60   AST 13 - 35 42      26   ALT 7 - 35 U/L 23      21      This result is from an external source.    Lab Results  Component Value Date   ALBUMINELP 4.1 08/30/2018   A1GS 0.3 08/30/2018   A2GS 0.8 08/30/2018   BETS 0.5 08/30/2018   BETA2SER 0.4 08/30/2018   GAMS 1.5 08/30/2018   SPEI  08/30/2018     Comment:     Normal Serum Protein Electrophoresis Pattern. No abnormal protein bands (M-protein) detected.     Lab Results  Component Value Date   LDH 175 02/10/2023  LDH 174 08/10/2022    STUDIES:    Exam(s): [id number] CT/CT CHEST-ABD-PELV W/IV CM  CLINICAL DATA: Large B-cell lymphoma. Assess treatment response. *  Tracking Code: BO *  EXAM:  CT CHEST, ABDOMEN, AND PELVIS WITH CONTRAST  TECHNIQUE:  Multidetector CT imaging of the chest, abdomen and pelvis was  performed following the standard protocol during bolus  administration of  intravenous contrast.  RADIATION DOSE REDUCTION: This exam was performed according to the  departmental dose-optimization program which includes automated  exposure control, adjustment of the mA and/or kV according to  patient size and/or use of iterative reconstruction technique.  CONTRAST: 100 cc Isovue  COMPARISON: None Available.  FINDINGS:  CT CHEST FINDINGS  Cardiovascular: No significant vascular findings. Normal heart size.  No pericardial effusion.  Mediastinum/Nodes: No axillary or supraclavicular adenopathy. No  mediastinal or hilar adenopathy. No pericardial fluid. Esophagus  normal.  Lungs/Pleura: Mild atelectasis at the lung bases. No suspicious  nodularity. No pleural fluid.  Musculoskeletal: No aggressive osseous lesion.  CT ABDOMEN AND PELVIS FINDINGS  Hepatobiliary: No focal hepatic lesion. Postcholecystectomy. No  biliary dilatation.  Pancreas: Pancreas is normal. No ductal dilatation. No pancreatic  inflammation.  Spleen: Normal spleen  Adrenals/urinary tract: Adrenal glands and kidneys are normal. The  ureters and bladder normal.  Stomach/Bowel: Stomach, small bowel, appendix, and cecum are normal.  Multiple diverticula of the descending colon and sigmoid colon  without acute inflammation.  Vascular/Lymphatic: Abdominal aorta is normal caliber with  atherosclerotic calcification. There is no retroperitoneal or  periportal lymphadenopathy. No pelvic lymphadenopathy.  Reproductive: Uterus and adnexa unremarkable.  Other: No free fluid.  Musculoskeletal: No aggressive osseous lesion.  IMPRESSION:  CHEST:  No evidence of lymphoma in the chest.  PELVIS:  1. No evidence of lymphoma in the abdomen pelvis.  2. Normal spleen.  3. Aortic Atherosclerosis (ICD10-I70.0).   HISTORY:   Allergies:  Allergies  Allergen Reactions   Doxycycline Other (See Comments)    Pancreatic issues   Hydromorphone Other (See Comments)   Metronidazole Other (See Comments)     Cream  Burned face   Dilaudid [Hydromorphone Hcl] Anxiety    Current Medications: Current Outpatient Medications  Medication Sig Dispense Refill   anastrozole (ARIMIDEX) 1 MG tablet Take 1 tablet (1 mg total) by mouth daily. 90 tablet 1   ALPRAZolam (XANAX) 0.25 MG tablet Take 1 tablet (0.25 mg total) by mouth 3 (three) times daily as needed for anxiety. 30 tablet 0   aspirin EC 81 MG tablet Take 1 tablet (81 mg total) by mouth 2 (two) times daily. To be taken after surgery to prevent blood clots (Patient taking differently: Take 81 mg by mouth once. To be taken after surgery to prevent blood clots) 84 tablet 0   carboxymethylcellulose (REFRESH TEARS) 0.5 % SOLN Place 1 drop into both eyes 3 (three) times daily as needed (dry eyes).     cetirizine (ALLERGY, CETIRIZINE,) 10 MG tablet Take 10 mg by mouth daily.     Continuous Glucose Receiver (DEXCOM G6 RECEIVER) DEVI Use to check blood sugars every morning fasting and 2 hours after largest meal 1 each 0   Continuous Glucose Sensor (DEXCOM G6 SENSOR) MISC Apply new sensor every 10 days.  Use to check blood sugars every morning fasting and 2 hours after largest meal 9 each 3   Continuous Glucose Transmitter (DEXCOM G6 TRANSMITTER) MISC Use to check blood sugars every morning fasting and 2 hours after largest meal 1 each 3  famotidine (PEPCID) 20 MG tablet Take 1 tablet (20 mg total) by mouth 2 (two) times daily. 200 tablet 2   fluticasone (FLONASE) 50 MCG/ACT nasal spray Place 1 spray into both nostrils daily as needed for allergies or rhinitis. 16 g 5   folic acid (FOLVITE) 400 MCG tablet Take 400 mcg by mouth daily.     gabapentin (NEURONTIN) 600 MG tablet TAKE 2 TABLETS BY MOUTH 3 TIMES  DAILY 540 tablet 3   insulin glargine (LANTUS SOLOSTAR) 100 UNIT/ML Solostar Pen INJECT 70 UNITS INTO THE SKIN DAILY. NEED OFFICE VISIT FOR FURTHER REFILLS 15 mL 3   insulin lispro (HUMALOG KWIKPEN) 100 UNIT/ML KwikPen Inject 20 Units into the skin 3 (three)  times daily. INJECT 20 UNITS 3 TIMES A DAY WITH MEALS 54 mL 1   losartan (COZAAR) 25 MG tablet TAKE 1 TABLET BY MOUTH DAILY 90 tablet 3   meloxicam (MOBIC) 15 MG tablet Take 1 tablet (15 mg total) by mouth daily. 60 tablet 1   Multiple Vitamin (MULTIVITAMIN) tablet Take 1 tablet by mouth daily.     NIACIN PO Take 1,000 mg by mouth at bedtime. Take 1 hour after the 81 mg aspirin     PIP PEN NEEDLES 31G X 31G X 5 MM MISC USE AS DIRECTED WITH INSULIN PENS (4 PER DAY)     No current facility-administered medications for this visit.     ASSESSMENT & PLAN:  Assessment/Plan:    1.  History of stage 0 ductal carcinoma in situ, treated with lumpectomy. We offered chemoprevention with raloxifene  but she declined.  2.  History of stage IV diffuse large B-cell lymphoma diagnosed in May 2016, treated with 6 cycles of R-CHOP chemotherapy with a complete response.  Most recent imaging from August 2022 did not reveal any evidence of lymphoma recurrence. As she is over 5 years from diagnosis, routine imaging is not indicated.   3.  Mediastinal node vs thyroid nodule, which has been stable. The radiologist feels it is more likely a thyroid nodule.    4. Small black mole in her left posterior upper shoulder which appears benign and measures 5 mm. We offered to have the dermatologist evaluate. The patient decided to continue observation.  5. New stage IA hormone receptor positive right breast in .  EndoPredict in August that revealed a low risk EpClin score of 2.7 for a 5.6% risk for recurrence in 0-10 years and 1.3% for an estimated absolute chemotherapy benefit at 10 years. Her likelihood of late distant recurrence in 5-15 years is 4.4%. She has completed radiation therapy. Bone density scan is pending. I will place her on anastrozole 1 mg daily as previously discussed with Dr. Gilman Buttner. If she has an abnormal bone density scan, we will address that.  6.  Family history of colon cancer. Genetic testing was  negative. She will continue screening colonoscopy every 5 years. She is due for that now, so will schedule with her gastroenterologist.   I provided 30 minutes of face-to-face time during this this encounter and > 50% was spent counseling as documented under my assessment and plan.    I,Jasmine Davila,acting as a Neurosurgeon for [employer], PA-C.,have documented all relevant documentation on the behalf of Jasmine Perl, PA-C,as directed by  Jasmine Perl, PA-C while in the presence of [employer], PA-C.

## 2023-10-29 NOTE — Progress Notes (Signed)
Pharmacy Quality Measure Review  This patient is appearing on report for being at risk of failing the measure for Statin Use in Persons with Diabetes (SUPD) medications this calendar year.   PCP note from June 2024 states patient has history of statin intolerance. She does take Niacin. LDL above goal at 91. Patient currently follows oncology for treatment of breast cancer (current plan involves radiation and hormone therapy).  Lipid Panel     Component Value Date/Time   CHOL 185 10/21/2022 1052   TRIG 109 10/21/2022 1052   HDL 75 10/21/2022 1052   CHOLHDL 2.5 10/21/2022 1052   CHOLHDL 3.0 06/22/2019 0530   VLDL 15 06/22/2019 0530   LDLCALC 91 10/21/2022 1052   LABVLDL 19 10/21/2022 1052   The 10-year ASCVD risk score (Arnett DK, et al., 2019) is: 43.3%   Values used to calculate the score:     Age: 72 years     Sex: Female     Is Non-Hispanic African American: No     Diabetic: Yes     Tobacco smoker: Yes     Systolic Blood Pressure: 138 mmHg     Is BP treated: Yes     HDL Cholesterol: 75 mg/dL     Total Cholesterol: 185 mg/dL  Consider risk-benefits discussion of non-statin therapies for cholesterol management and reduction of ASCD risk.   Sofie Rower, PharmD [facility] PGY-1

## 2023-11-01 ENCOUNTER — Other Ambulatory Visit: Payer: Self-pay | Admitting: Oncology

## 2023-11-01 DIAGNOSIS — F411 Generalized anxiety disorder: Secondary | ICD-10-CM

## 2023-11-02 ENCOUNTER — Encounter: Payer: Self-pay | Admitting: Oncology

## 2023-11-02 MED ORDER — ALPRAZOLAM 0.25 MG PO TABS: 0.2500 mg | ORAL_TABLET | Freq: Three times a day (TID) | ORAL | 0 refills | Status: DC | PRN

## 2023-11-04 ENCOUNTER — Telehealth: Payer: Self-pay

## 2023-11-04 NOTE — Telephone Encounter (Signed)
Called pt LVM to contact the office °

## 2023-11-05 ENCOUNTER — Encounter: Payer: Self-pay | Admitting: Family Medicine

## 2023-11-05 ENCOUNTER — Other Ambulatory Visit: Payer: Self-pay | Admitting: Family Medicine

## 2023-11-05 ENCOUNTER — Ambulatory Visit (INDEPENDENT_AMBULATORY_CARE_PROVIDER_SITE_OTHER): Payer: Medicare Other | Admitting: Family Medicine

## 2023-11-05 DIAGNOSIS — Z23 Encounter for immunization: Secondary | ICD-10-CM | POA: Diagnosis not present

## 2023-11-05 DIAGNOSIS — Z794 Long term (current) use of insulin: Secondary | ICD-10-CM

## 2023-11-05 MED ORDER — DEXCOM G6 SENSOR MISC: 3 refills | Status: DC

## 2023-11-05 MED ORDER — DEXCOM G6 TRANSMITTER MISC: 3 refills | Status: DC

## 2023-11-05 MED ORDER — DEXCOM G6 RECEIVER DEVI: 0 refills | Status: AC

## 2023-11-05 NOTE — Progress Notes (Signed)
Patient is here for her flu vaccination pt tolerated the injection

## 2023-11-05 NOTE — Progress Notes (Addendum)
Forwarded note from a PGY1 addressing failed SUPD measure to Dr. Constance Goltz.  He states she cannot tolerate statins and will obtain details at next visit to document accordingly.  Lenna Gilford, PharmD, DPLA

## 2023-11-10 ENCOUNTER — Encounter: Payer: Self-pay | Admitting: Hematology and Oncology

## 2023-11-10 ENCOUNTER — Inpatient Hospital Stay: Payer: Medicare Other | Attending: Oncology | Admitting: Hematology and Oncology

## 2023-11-10 ENCOUNTER — Telehealth: Payer: Self-pay | Admitting: Hematology and Oncology

## 2023-11-10 ENCOUNTER — Ambulatory Visit
Admission: RE | Admit: 2023-11-10 | Discharge: 2023-11-10 | Disposition: A | Discharge status: Home / Self Care | Payer: Medicare Other | Source: Ambulatory Visit | Attending: Radiation Oncology | Admitting: Radiation Oncology

## 2023-11-10 VITALS — BP 122/89 | HR 76 | Temp 98.5°F | Resp 18 | Ht 67.0 in | Wt 232.6 lb

## 2023-11-10 VITALS — BP 122/59 | HR 76 | Temp 98.5°F | Resp 18 | Ht 67.0 in | Wt 237.6 lb

## 2023-11-10 DIAGNOSIS — Z8719 Personal history of other diseases of the digestive system: Secondary | ICD-10-CM | POA: Diagnosis not present

## 2023-11-10 DIAGNOSIS — Z853 Personal history of malignant neoplasm of breast: Secondary | ICD-10-CM | POA: Insufficient documentation

## 2023-11-10 DIAGNOSIS — Z170 Estrogen receptor positive status [ER+]: Secondary | ICD-10-CM | POA: Diagnosis not present

## 2023-11-10 DIAGNOSIS — Z800 Family history of malignant neoplasm of digestive organs: Secondary | ICD-10-CM | POA: Diagnosis not present

## 2023-11-10 DIAGNOSIS — Z881 Allergy status to other antibiotic agents status: Secondary | ICD-10-CM | POA: Insufficient documentation

## 2023-11-10 DIAGNOSIS — Z8572 Personal history of non-Hodgkin lymphomas: Secondary | ICD-10-CM | POA: Insufficient documentation

## 2023-11-10 DIAGNOSIS — C50511 Malignant neoplasm of lower-outer quadrant of right female breast: Secondary | ICD-10-CM

## 2023-11-10 DIAGNOSIS — N6081 Other benign mammary dysplasias of right breast: Secondary | ICD-10-CM | POA: Diagnosis not present

## 2023-11-10 DIAGNOSIS — I700 Atherosclerosis of aorta: Secondary | ICD-10-CM | POA: Diagnosis not present

## 2023-11-10 DIAGNOSIS — Z79899 Other long term (current) drug therapy: Secondary | ICD-10-CM | POA: Diagnosis not present

## 2023-11-10 DIAGNOSIS — D2260 Melanocytic nevi of unspecified upper limb, including shoulder: Secondary | ICD-10-CM | POA: Diagnosis not present

## 2023-11-10 DIAGNOSIS — Z885 Allergy status to narcotic agent status: Secondary | ICD-10-CM | POA: Diagnosis not present

## 2023-11-10 DIAGNOSIS — Z79811 Long term (current) use of aromatase inhibitors: Secondary | ICD-10-CM | POA: Insufficient documentation

## 2023-11-10 DIAGNOSIS — Z923 Personal history of irradiation: Secondary | ICD-10-CM | POA: Insufficient documentation

## 2023-11-10 MED ORDER — ANASTROZOLE 1 MG PO TABS: 1.0000 mg | ORAL_TABLET | Freq: Every day | ORAL | 1 refills | Status: DC

## 2023-11-10 NOTE — Progress Notes (Signed)
Face to face visit with pt in exam room. Pt is here today for rad onc follow up and med onc visit. Pt reports that she die well with radiation and will start her aromatase inhibitors today. Pt denies any complaints or problems.

## 2023-11-10 NOTE — Telephone Encounter (Signed)
11/10/23 Next appt scheduled and confirmed with patient

## 2023-11-10 NOTE — Progress Notes (Signed)
Department of Radiation Oncology  Phone:  [phone]   Follow-up Note   Name: Jasmine Davila Date: 11/10/2023 MRN: [id number] DOB: 04-18-1951   Diagnosis: Invasive ductal carcinoma of the right breast  HPI: She completed adjuvant breast radiation approximately 1 month ago.  This is her first follow-up visit.  She has follow-up scheduled with medical oncology today as well.  MEDICATIONS: Current Outpatient Medications  Medication Sig Dispense Refill   ALPRAZolam (XANAX) 0.25 MG tablet Take 1 tablet (0.25 mg total) by mouth 3 (three) times daily as needed for anxiety. 30 tablet 0   anastrozole (ARIMIDEX) 1 MG tablet Take 1 tablet (1 mg total) by mouth daily. 90 tablet 1   aspirin EC 81 MG tablet Take 1 tablet (81 mg total) by mouth 2 (two) times daily. To be taken after surgery to prevent blood clots (Patient taking differently: Take 81 mg by mouth once. To be taken after surgery to prevent blood clots) 84 tablet 0   carboxymethylcellulose (REFRESH TEARS) 0.5 % SOLN Place 1 drop into both eyes 3 (three) times daily as needed (dry eyes).     cetirizine (ALLERGY, CETIRIZINE,) 10 MG tablet Take 10 mg by mouth daily.     Continuous Glucose Receiver (DEXCOM G6 RECEIVER) DEVI Use to check blood sugars every morning fasting and 2 hours after largest meal 1 each 0   Continuous Glucose Sensor (DEXCOM G6 SENSOR) MISC Apply new sensor every 10 days.  Use to check blood sugars every morning fasting and 2 hours after largest meal 9 each 3   Continuous Glucose Transmitter (DEXCOM G6 TRANSMITTER) MISC Use to check blood sugars every morning fasting and 2 hours after largest meal 1 each 3   famotidine (PEPCID) 20 MG tablet Take 1 tablet (20 mg total) by mouth 2 (two) times daily. 200 tablet 2   fluticasone (FLONASE) 50 MCG/ACT nasal spray Place 1 spray into both nostrils daily as needed for allergies or rhinitis. 16 g 5   folic acid (FOLVITE) 400 MCG tablet Take 400 mcg by mouth daily.      gabapentin (NEURONTIN) 600 MG tablet TAKE 2 TABLETS BY MOUTH 3 TIMES  DAILY 540 tablet 3   insulin glargine (LANTUS SOLOSTAR) 100 UNIT/ML Solostar Pen INJECT 70 UNITS INTO THE SKIN DAILY. NEED OFFICE VISIT FOR FURTHER REFILLS 15 mL 3   insulin lispro (HUMALOG KWIKPEN) 100 UNIT/ML KwikPen Inject 20 Units into the skin 3 (three) times daily. INJECT 20 UNITS 3 TIMES A DAY WITH MEALS 54 mL 1   losartan (COZAAR) 25 MG tablet TAKE 1 TABLET BY MOUTH DAILY 90 tablet 3   meloxicam (MOBIC) 15 MG tablet Take 1 tablet (15 mg total) by mouth daily. 60 tablet 1   Multiple Vitamin (MULTIVITAMIN) tablet Take 1 tablet by mouth daily.     NIACIN PO Take 1,000 mg by mouth at bedtime. Take 1 hour after the 81 mg aspirin     PIP PEN NEEDLES 31G X 31G X 5 MM MISC USE AS DIRECTED WITH INSULIN PENS (4 PER DAY)     No current facility-administered medications for this encounter.     ALLERGIES: Doxycycline, Hydromorphone, Metronidazole, and Dilaudid [hydromorphone hcl]   LABORATORY DATA:  Lab Results  Component Value Date   WBC 4.9 02/10/2023   HGB 13.5 02/10/2023   HCT 40.6 02/10/2023   MCV 86.6 02/10/2023   PLT 153 02/10/2023   Lab Results  Component Value Date   NA 134 (A) 09/29/2023  K 4.2 09/29/2023   CL 99 09/29/2023   CO2 32 (A) 09/29/2023   Lab Results  Component Value Date   ALT 23 09/29/2023   AST 42 (A) 09/29/2023   ALKPHOS 85 09/29/2023   BILITOT 0.7 02/10/2023     NARRATIVE: She reports that her energy level is improving.  Her radiation related skin changes have largely resolved.  She continues to note some nipple sensitivity.  She reports no pain in her right breast.   PHYSICAL EXAMINATION: height is 5\' 7"  (1.702 m) and weight is 237 lb 9.6 oz (107.8 kg). Her oral temperature is 98.5 F (36.9 C). Her blood pressure is 122/59 (abnormal) and her pulse is 76. Her respiration is 18 and oxygen saturation is 99%.        ASSESSMENT/PLAN: She is doing well approximately 1 month out  from completion of adjuvant breast radiation.  Her radiation related skin changes have largely resolved.  Her energy level is improving.  She has follow-up scheduled today with medical oncology.  As she will be following up routinely with medical oncology, further follow-up in our department will be on an as-needed basis, though I encouraged her to contact me at anytime with any questions or concerns she may have.    Marque Rademaker A. Thersa Salt, MD

## 2023-11-12 ENCOUNTER — Encounter: Payer: Self-pay | Admitting: Family Medicine

## 2023-11-14 ENCOUNTER — Encounter: Payer: Self-pay | Admitting: Hematology and Oncology

## 2023-11-18 ENCOUNTER — Encounter: Payer: Self-pay | Admitting: Oncology

## 2023-11-24 ENCOUNTER — Other Ambulatory Visit: Payer: Self-pay | Admitting: Family Medicine

## 2023-11-24 DIAGNOSIS — Z794 Long term (current) use of insulin: Secondary | ICD-10-CM | POA: Diagnosis not present

## 2023-11-24 DIAGNOSIS — E1159 Type 2 diabetes mellitus with other circulatory complications: Secondary | ICD-10-CM

## 2023-11-24 DIAGNOSIS — E0821 Diabetes mellitus due to underlying condition with diabetic nephropathy: Secondary | ICD-10-CM

## 2023-11-25 ENCOUNTER — Other Ambulatory Visit: Payer: Self-pay | Admitting: Hematology and Oncology

## 2023-11-25 DIAGNOSIS — Z13820 Encounter for screening for osteoporosis: Secondary | ICD-10-CM

## 2023-12-06 ENCOUNTER — Other Ambulatory Visit: Payer: Self-pay | Admitting: Hematology and Oncology

## 2023-12-06 DIAGNOSIS — F411 Generalized anxiety disorder: Secondary | ICD-10-CM

## 2023-12-07 MED ORDER — ALPRAZOLAM 0.25 MG PO TABS: 0.2500 mg | ORAL_TABLET | Freq: Three times a day (TID) | ORAL | 0 refills | Status: DC | PRN

## 2023-12-09 ENCOUNTER — Ambulatory Visit: Payer: Medicare Other | Admitting: Family Medicine

## 2023-12-09 ENCOUNTER — Encounter: Payer: Self-pay | Admitting: Family Medicine

## 2023-12-09 VITALS — BP 94/65 | HR 75 | Ht 67.0 in | Wt 235.4 lb

## 2023-12-09 DIAGNOSIS — Z794 Long term (current) use of insulin: Secondary | ICD-10-CM | POA: Diagnosis not present

## 2023-12-09 DIAGNOSIS — E785 Hyperlipidemia, unspecified: Secondary | ICD-10-CM

## 2023-12-09 DIAGNOSIS — E1169 Type 2 diabetes mellitus with other specified complication: Secondary | ICD-10-CM | POA: Diagnosis not present

## 2023-12-09 LAB — POCT GLYCOSYLATED HEMOGLOBIN (HGB A1C): HbA1c POC (<> result, manual entry): 5.8 % (ref 4.0–5.6)

## 2023-12-09 NOTE — Patient Instructions (Signed)
It was nice to see you today,  We addressed the following topics today: -Your A1c was 5.8.  I would like you to try decreasing your Lantus dose from 70 units to 65 units in the morning to help see if this will resolve the occasional low blood sugars and get at night.  If you notice that you have a large spike in your blood sugar readings after making this change let us know.  Otherwise we will recheck the A1c in 3 months.  Ideally we would like it to be between 6 and 6.5.   Have a great day,  Frederic Jericho, MD

## 2023-12-09 NOTE — Assessment & Plan Note (Signed)
A1c 5.8.  Given her hypoglycemic episodes noted on her Dexcom at night, recommended decreasing Lantus slightly.  Patient agreeable to this.  Will do 65 units Lantus instead of 70 in the morning.  Follow-up in 3 months.

## 2023-12-09 NOTE — Assessment & Plan Note (Signed)
Pt has declined statins in the past.  Takes asa and niacin instead.  LDLs have been < 100.  Recheck lipid panel.

## 2023-12-09 NOTE — Progress Notes (Signed)
   Established Patient Office Visit  Subjective   Patient ID: Jasmine Davila, female    DOB: 09/25/1951  Age: 72 y.o. MRN: [id number]  Chief Complaint  Patient presents with   Medical Management of Chronic Issues    HPI   DM2-patient taking 70 units of Lantus in the morning with sliding scale Humalog with meals.  She wears her Dexcom.  She has asymptomatic hypoglycemic episodes at night may be once or twice a week.  We discussed her A1c, which was 5.8.  Discussed lowering the Lantus dose slightly to see if that reduces hypoglycemic episodes.  Patient states she has never tried a statin.  Did not want to take a statin so this is why her previous PCP had her on aspirin and niacin.  Does not want to start statin at this time.  We discussed her cholesterol levels which have been under 100 for LDLs consistently.  Patient complains of abdominal pain, generalized, happening once or twice a week.  Started after she started anastrozole.  Has had some nausea but no vomiting.  No bowel changes.  Symptoms generally mild to moderate.  Patient sees a podiatrist, Dr. Logan Bores.  Has essentially no sensation in her feet due to neuropathy.  Has had steroid injections into the ankle for arthritis that have been somewhat effective.  Patient sees Dr. Allena Katz for her diabetic retinopathy eye exams.   The 10-year ASCVD risk score (Arnett DK, et al., 2019) is: 15.3%  Health Maintenance Due  Topic Date Due   Hepatitis C Screening  Never done   DTaP/Tdap/Td (1 - Tdap) Never done   COVID-19 Vaccine (3 - Moderna risk series) 03/29/2020   OPHTHALMOLOGY EXAM  03/27/2021      Objective:     BP 94/65   Pulse 75   Ht 5\' 7"  (1.702 m)   Wt 235 lb 6.4 oz (106.8 kg)   LMP 12/21/1998 (Approximate)   SpO2 99%   BMI 36.87 kg/m    Physical Exam General: Alert, oriented CV: Regular rate and rhythm Pulmonary: Scar bilaterally GI: Soft, minimal tenderness in the left lower quadrant.  Normal bowel  sounds Extremities: No sensation to monofilament bilaterally.  Callused skin on the plantar aspect bilaterally.   Results for orders placed or performed in visit on 12/09/23  POCT HgB A1C  Result Value Ref Range   Hemoglobin A1C     HbA1c POC (<> result, manual entry) 5.8 4.0 - 5.6 %   HbA1c, POC (prediabetic range)     HbA1c, POC (controlled diabetic range)          Assessment & Plan:   Hyperlipidemia associated with type 2 diabetes mellitus (HCC) Assessment & Plan: Pt has declined statins in the past.  Takes asa and niacin instead.  LDLs have been < 100.  Recheck lipid panel.    Orders: -     Lipid panel  Diabetes mellitus due to underlying condition with diabetic nephropathy, with long-term current use of insulin (HCC) Assessment & Plan: A1c 5.8.  Given her hypoglycemic episodes noted on her Dexcom at night, recommended decreasing Lantus slightly.  Patient agreeable to this.  Will do 65 units Lantus instead of 70 in the morning.  Follow-up in 3 months.  Orders: -     POCT glycosylated hemoglobin (Hb A1C)     Return in about 3 months (around 03/08/2024) for DM.    Sandre Kitty, MD

## 2023-12-10 LAB — LIPID PANEL
Chol/HDL Ratio: 2.9 {ratio} (ref 0.0–4.4)
Cholesterol, Total: 129 mg/dL (ref 100–199)
HDL: 45 mg/dL (ref >39)
LDL Chol Calc (NIH): 66 mg/dL (ref 0–99)
Triglycerides: 98 mg/dL (ref 0–149)
VLDL Cholesterol Cal: 18 mg/dL (ref 5–40)

## 2024-01-02 ENCOUNTER — Other Ambulatory Visit: Payer: Self-pay | Admitting: Hematology and Oncology

## 2024-01-02 DIAGNOSIS — F411 Generalized anxiety disorder: Secondary | ICD-10-CM

## 2024-01-03 ENCOUNTER — Ambulatory Visit (INDEPENDENT_AMBULATORY_CARE_PROVIDER_SITE_OTHER): Payer: Medicare Other | Admitting: Family Medicine

## 2024-01-03 VITALS — BP 119/74 | HR 87 | Temp 98.4°F | Ht 67.0 in | Wt 236.4 lb

## 2024-01-03 DIAGNOSIS — J329 Chronic sinusitis, unspecified: Secondary | ICD-10-CM | POA: Diagnosis not present

## 2024-01-03 DIAGNOSIS — B9689 Other specified bacterial agents as the cause of diseases classified elsewhere: Secondary | ICD-10-CM

## 2024-01-03 MED ORDER — AMOXICILLIN-POT CLAVULANATE 875-125 MG PO TABS: 1.0000 | ORAL_TABLET | Freq: Two times a day (BID) | ORAL | 0 refills | Status: DC

## 2024-01-03 MED ORDER — AMOXICILLIN-POT CLAVULANATE 875-125 MG PO TABS: 1.0000 | ORAL_TABLET | Freq: Two times a day (BID) | ORAL | 0 refills | Status: AC

## 2024-01-03 NOTE — Assessment & Plan Note (Signed)
 5 days of symptoms.  Sinus tenderness on exam.  Will send in antibiotics due to her increased risk with diabetes.  Has tolerated Augmentin in the past.  Will prescribe 7 days Augmentin.

## 2024-01-03 NOTE — Progress Notes (Signed)
   Acute Office Visit  Subjective:     Patient ID: Jasmine Davila, female    DOB: [birth year], 73 y.o.   MRN: [id number]  Chief Complaint  Patient presents with   Sinus Problem    HPI Patient is in today for sinus congestion.  Patient states last Thursday she was out at dinner and suddenly lost her voice.  This has improved.  Now complaining of frontal sinus pressure, sinus congestion, slight fever early on of 99.3.  Her cough is mild.  No ear complaints.  No nausea or vomiting.  Nobody else in her family has been sick.  Has had episodes of bacterial sinusitis in the past requiring antibiotics.  Patient currently taking over-the-counter medications including Mucinex, Sudafed.  Tried Flonase  but not Afrin yet.  ROS      Objective:    BP 119/74   Pulse 87   Temp 98.4 F (36.9 C)   Ht 5' 7 (1.702 m)   Wt 236 lb 6.4 oz (107.2 kg)   LMP 12/21/1998 (Approximate)   SpO2 100%   BMI 37.03 kg/m    Physical Exam General: Alert, oriented HEENT: Moist oral mucosa.  Tenderness palpation of the frontal sinuses. CV: Regular rhythm Pulmonary: Lungs clear bilaterally  No results found for any visits on 01/03/24.      Assessment & Plan:   Bacterial sinusitis Assessment & Plan: 5 days of symptoms.  Sinus tenderness on exam.  Will send in antibiotics due to her increased risk with diabetes.  Has tolerated Augmentin  in the past.  Will prescribe 7 days Augmentin .   Other orders -     Amoxicillin -Pot Clavulanate; Take 1 tablet by mouth 2 (two) times daily for 7 days.  Dispense: 14 tablet; Refill: 0     No follow-ups on file.  Toribio MARLA Slain, MD

## 2024-01-03 NOTE — Patient Instructions (Signed)
 It was nice to see you today,  We addressed the following topics today: -I have sent in a prescription for Augmentin  for you to take twice a day for the next 7 days. - You can also try nasal saline and nasal Afrin for symptomatic relief while you are taking this.  If you are taking the Afrin you do not need to take the Sudafed.    Have a great day,  Rolan Slain, MD

## 2024-01-06 MED ORDER — ALPRAZOLAM 0.25 MG PO TABS: 0.2500 mg | ORAL_TABLET | Freq: Three times a day (TID) | ORAL | 0 refills | Status: DC | PRN

## 2024-01-07 ENCOUNTER — Ambulatory Visit: Payer: Self-pay | Admitting: Family Medicine

## 2024-01-07 MED ORDER — BENZONATATE 200 MG PO CAPS: 200.0000 mg | ORAL_CAPSULE | Freq: Two times a day (BID) | ORAL | 0 refills | Status: AC | PRN

## 2024-01-07 NOTE — Telephone Encounter (Signed)
I sent a cough medicine to [facility].  I recommend taking the Augmentin with food to minimize the cough of diarrhea.  If this is still inadequate, she can start a fiber supplement like Metamucil or start an over-the-counter medicine like Imodium

## 2024-01-07 NOTE — Telephone Encounter (Signed)
Copied from CRM [id number]. Topic: Clinical - Medication Question >> Jan 07, 2024  8:17 AM Gildardo Pounds wrote: Reason for CRM: Patient started taking amoxicillin-clavulanate (AUGMENTIN) 875-125 MG tablet and diarrhea and cough started last night. Can you prescribe something for the patient? Callback number is [phone]

## 2024-01-07 NOTE — Telephone Encounter (Signed)
The diarrhea is likely a side effect of the Augmentin.  I recommend the following to reduce the diarrhea: Take your dose with food Take over-the-counter medications such as Imodium A-D (loperamide) or probiotics Follow the BRAT diet (bananas, rice, applesauce, toast)

## 2024-01-07 NOTE — Addendum Note (Signed)
Addended by: Saralyn Pilar on: 01/07/2024 08:53 AM   Modules accepted: Orders

## 2024-01-07 NOTE — Telephone Encounter (Signed)
Reason for Disposition  Patient wants to stop the antibiotic  Answer Assessment - Initial Assessment Questions 1. ANTIBIOTIC: "What antibiotic are you taking?" "How many times per day?"     Amoxicillin-Clavulanate (Augmentin)  2. ANTIBIOTIC ONSET: "When was the antibiotic started?"     Monday  3. DIARRHEA SEVERITY: "How bad is the diarrhea?" "How many more stools have you had in the past 24 hours than normal?"    - NO DIARRHEA (SCALE 0)   - MILD (SCALE 1-3): Few loose or mushy BMs; increase of 1-3 stools over normal daily number of stools; mild increase in ostomy output.   -  MODERATE (SCALE 4-7): Increase of 4-6 stools daily over normal; moderate increase in ostomy output. * SEVERE (SCALE 8-10; OR 'WORST POSSIBLE'): Increase of 7 or more stools daily over normal; moderate increase in ostomy output; incontinence.     About 15 times (some were small amount)  4. ONSET: "When did the diarrhea begin?"      Yesterday  5. VOMITING: "Are you also vomiting?" If Yes, ask: "How many times in the past 24 hours?"      No  6. ABDOMEN PAIN: "Are you having any abdomen pain?" If Yes, ask: "What does it feel like?" (e.g., crampy, dull, intermittent, constant)      No  7. ABDOMEN PAIN SEVERITY: If present, ask: "How bad is the pain?"  (e.g., Scale 1-10; mild, moderate, or severe)   - MILD (1-3): doesn't interfere with normal activities, abdomen soft and not tender to touch    - MODERATE (4-7): interferes with normal activities or awakens from sleep, abdomen tender to touch    - SEVERE (8-10): excruciating pain, doubled over, unable to do any normal activities       No pain  8. OTHER SYMPTOMS: "Do you have any other symptoms?" (e.g., fever, blood in stool)       Cough (was prescribed Tessalon pearls)  Protocols used: Diarrhea on Antibiotics-A-AH  Chief Complaint: Diarrhea from antibiotics Symptoms: Diarrhea, cough Frequency: Ongoing since yesterday Pertinent Negatives: Patient denies pain,  nausea, vomiting Disposition: [] ED /[] Urgent Care (no appt availability in office) / [] Appointment(In office/virtual)/ []  [facility]/ [] Home Care/ [] Refused Recommended Disposition /[] [facility]/ [x]  Follow-up with PCP  Additional Notes: Patient stated she was given a prescription for Amoxicillin-Clavulanate (Augmentin) and she has been taking it since Monday. Patient reported that it started given her diarrhea. She has been having diarrhea yesterday and today. Patient inquiring about different antibiotic. She also stated that she has a cough and got  prescription for Tessalon pearls. She stated she used that before and it does not help with her cough. Informed patient that a message will be sent and the office will follow up with her.

## 2024-01-07 NOTE — Telephone Encounter (Signed)
Called pt she is advised of her recommendation she is agreeable

## 2024-01-07 NOTE — Telephone Encounter (Signed)
Copied from CRM [id number]. Topic: Clinical - Red Word Triage >> Jan 07, 2024 11:19 AM Ivette P wrote: [facility] that prompted transfer to Nurse Triage: diarrhea, patient really needs antibiotics.

## 2024-01-12 ENCOUNTER — Other Ambulatory Visit: Payer: Self-pay | Admitting: Family Medicine

## 2024-01-12 DIAGNOSIS — K219 Gastro-esophageal reflux disease without esophagitis: Secondary | ICD-10-CM

## 2024-02-10 ENCOUNTER — Inpatient Hospital Stay: Payer: Medicare Other | Admitting: Oncology

## 2024-02-15 ENCOUNTER — Ambulatory Visit
Admission: RE | Admit: 2024-02-15 | Discharge: 2024-02-15 | Disposition: A | Discharge status: Home / Self Care | Payer: Medicare Other | Source: Ambulatory Visit | Attending: Hematology and Oncology | Admitting: Hematology and Oncology

## 2024-02-15 DIAGNOSIS — Z853 Personal history of malignant neoplasm of breast: Secondary | ICD-10-CM | POA: Diagnosis not present

## 2024-02-15 DIAGNOSIS — Z13820 Encounter for screening for osteoporosis: Secondary | ICD-10-CM | POA: Insufficient documentation

## 2024-02-15 DIAGNOSIS — E119 Type 2 diabetes mellitus without complications: Secondary | ICD-10-CM | POA: Insufficient documentation

## 2024-02-15 DIAGNOSIS — Z780 Asymptomatic menopausal state: Secondary | ICD-10-CM | POA: Insufficient documentation

## 2024-02-18 ENCOUNTER — Telehealth: Payer: Self-pay

## 2024-02-18 NOTE — Telephone Encounter (Signed)
 Patient notified and voiced understanding.

## 2024-02-18 NOTE — Telephone Encounter (Signed)
-----   Message from Jasmine Davila sent at 02/18/2024 11:26 AM EST ----- Please let her know her bone density scan is normal. Thanks

## 2024-02-21 ENCOUNTER — Other Ambulatory Visit: Payer: Self-pay | Admitting: Podiatry

## 2024-02-22 ENCOUNTER — Telehealth: Payer: Self-pay

## 2024-02-22 NOTE — Telephone Encounter (Signed)
-----   Message from Jasmine Davila sent at 02/22/2024 11:09 AM EST ----- Please let her know her bone density scan was normal. Thanks

## 2024-02-22 NOTE — Telephone Encounter (Signed)
 Patient notified and voiced understanding.

## 2024-02-26 ENCOUNTER — Other Ambulatory Visit: Payer: Self-pay | Admitting: Hematology and Oncology

## 2024-02-26 DIAGNOSIS — F411 Generalized anxiety disorder: Secondary | ICD-10-CM

## 2024-02-28 MED ORDER — ALPRAZOLAM 0.25 MG PO TABS: 0.2500 mg | ORAL_TABLET | Freq: Three times a day (TID) | ORAL | 0 refills | Status: DC | PRN

## 2024-03-02 ENCOUNTER — Telehealth: Payer: Self-pay | Admitting: *Deleted

## 2024-03-02 NOTE — Telephone Encounter (Signed)
 Copied from CRM [id number]. Topic: Medical Record Request - Provider/Facility Request >> Mar 02, 2024  3:06 PM Martha Clan wrote: Reason for CRM: [facility] Needs chart notes for patients diabetic diagnosis, medication and equipment needed. Call back:[phone] [phone]

## 2024-03-03 ENCOUNTER — Inpatient Hospital Stay: Payer: Medicare Other | Attending: Oncology | Admitting: Oncology

## 2024-03-03 ENCOUNTER — Encounter: Payer: Self-pay | Admitting: Oncology

## 2024-03-03 ENCOUNTER — Other Ambulatory Visit: Payer: Self-pay | Admitting: Oncology

## 2024-03-03 VITALS — BP 124/62 | HR 71 | Temp 98.4°F | Resp 16 | Ht 67.0 in | Wt 243.1 lb

## 2024-03-03 DIAGNOSIS — Z79899 Other long term (current) drug therapy: Secondary | ICD-10-CM | POA: Diagnosis not present

## 2024-03-03 DIAGNOSIS — Z8719 Personal history of other diseases of the digestive system: Secondary | ICD-10-CM | POA: Diagnosis not present

## 2024-03-03 DIAGNOSIS — Z885 Allergy status to narcotic agent status: Secondary | ICD-10-CM | POA: Diagnosis not present

## 2024-03-03 DIAGNOSIS — Z923 Personal history of irradiation: Secondary | ICD-10-CM | POA: Insufficient documentation

## 2024-03-03 DIAGNOSIS — Z881 Allergy status to other antibiotic agents status: Secondary | ICD-10-CM | POA: Insufficient documentation

## 2024-03-03 DIAGNOSIS — Z79811 Long term (current) use of aromatase inhibitors: Secondary | ICD-10-CM | POA: Insufficient documentation

## 2024-03-03 DIAGNOSIS — N6081 Other benign mammary dysplasias of right breast: Secondary | ICD-10-CM | POA: Insufficient documentation

## 2024-03-03 DIAGNOSIS — C801 Malignant (primary) neoplasm, unspecified: Secondary | ICD-10-CM | POA: Diagnosis not present

## 2024-03-03 DIAGNOSIS — C50511 Malignant neoplasm of lower-outer quadrant of right female breast: Secondary | ICD-10-CM | POA: Diagnosis not present

## 2024-03-03 DIAGNOSIS — D2260 Melanocytic nevi of unspecified upper limb, including shoulder: Secondary | ICD-10-CM | POA: Insufficient documentation

## 2024-03-03 DIAGNOSIS — G629 Polyneuropathy, unspecified: Secondary | ICD-10-CM | POA: Insufficient documentation

## 2024-03-03 DIAGNOSIS — Z1732 Human epidermal growth factor receptor 2 negative status: Secondary | ICD-10-CM | POA: Diagnosis not present

## 2024-03-03 DIAGNOSIS — Z170 Estrogen receptor positive status [ER+]: Secondary | ICD-10-CM | POA: Diagnosis not present

## 2024-03-03 DIAGNOSIS — Z800 Family history of malignant neoplasm of digestive organs: Secondary | ICD-10-CM | POA: Insufficient documentation

## 2024-03-03 DIAGNOSIS — C50311 Malignant neoplasm of lower-inner quadrant of right female breast: Secondary | ICD-10-CM | POA: Diagnosis not present

## 2024-03-03 NOTE — Progress Notes (Signed)
 [facility]. Pt is here for Med Onc follow up. Pt has completed all treatment accept the AI. Pt reports she is taking the medication but does not like to hot flashes. Pt to discuss with oncology today.

## 2024-03-03 NOTE — Telephone Encounter (Signed)
 This is for insulin supplies?  We can fax them our note from 12/19

## 2024-03-03 NOTE — Progress Notes (Signed)
 [facility]  [address] [place],  [place]  [address] [phone]  Clinic Day:03/03/24  Referring physician: Sandre Kitty, MD  CHIEF COMPLAINT:  CC:  Stage IA hormone receptor positive breast cancer  Current Treatment:   Adjuvant hormonal therapy  HISTORY OF PRESENT ILLNESS:  Jasmine Davila is a 73 y.o. 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 Annual screening mammogram revealed an area of calcifications with in the right breast working further evaluation.  Diagnostic unilateral right mammogram in May confirmed an area of pleomorphic calcifications spanning 1.1 cm in the lower inner quadrant of the right breast.  Stereotactic biopsy revealed intermediate grade, ductal carcinoma in situ with necrosis.  Fibrocystic changes including florid and micropapillary usual ductal hyperplasia and micropapillary apocrine metaplasia were also seen.  No invasive carcinoma was identified.  Estrogen receptor was positive at 90% and progesterone receptor was positive at 95%.  She underwent lumpectomy in June.  Surgical pathology confirmed a residual 1 mm  ductal carcinoma in situ, intermediate grade.  No invasive carcinoma identified and margins were free of neoplasm. We recommended chemoprevention with raloxifene, however, she declined.  She also has a history of stage IV diffuse large B-cell lymphoma diagnosed in May 2016, treated with 6 cycles of R-CHOP with a complete response.  She has remained without evidence of recurrence.  She undergoes screening colonoscopy every 5 years due to a family history of colon cancer. He last colonoscopy was  in November 2020. Bone density scan in June 2020 was normal.  She has had pancreatitis on and off since 2008.  Annual mammography from May 2022 was clear. CT imaging of the chest, abdomen and pelvis from August 2022 revealed no evidence of metastatic breast cancer or  recurrence of lymphoma.   New stage IA (T1c N0 M0) invasive ductal carcinoma of the right breast diagnosed in June 2024.  There was a possible mass in the right breast on screening mammogram in June. Right diagnostic right mammogram and ultrasound in June revealed a 1.2 cm mass in the right lower quadrant. The right axilla was negative on ultrasound. Biopsy revealed grade 2, invasive ductal carcinoma. Estrogen and progesterone receptors were positive and HER2 negative. Ki67 was 30%. She was treated with in July. Pathology revealed 1.5 cm, grade 2,  invasive ductal carcinoma with ductal carcinoma in situ. Margins were negative. EndoPredict in August revealed a low risk Epclin score of 2.7 which corresponds with a 5.6% for recurrence in 0-10 years and a 1.3% for an estimated absolute chemotherapy benefit at 10 years, as well as a  likelihood of late distant recurrence in 5-15 years is 4.4%.   INTERVAL HISTORY:  Jasmine Davila is here for follow-up of her new stage IA hormone receptor positive invasive ductal carcinoma of the right breast diagnosed in June, 2024. She completed radiation to the right breast in October.  She also had a ductal carcinoma in situ diagnosed in 2021 and has a history of diffuse large B-cell lymphoma in 2016. She had genetic testing and this did not reveal any clinically significant mutation or variants of uncertain significance. She was started on hormonal therapy with Anastrozole in November, 2024. Patient states that she feels well but complains of painful neuropathy of both feet especially the dorsal surface. She continues Gabapentin 600mg  2 tablets TID. She continues Anastrozole without difficulty and just experiences hot flashes. She requested to have her mammograms done here  from now on and I can arrange that for her. I informed her that we are not doing diagnostic mammograms at the moment so I will schedule her at [facility] in [place] for this next one in May. Her last bone  density was done on 02/15/2024 which was completely normal. I will see her back in 3 months with repeat bilateral diagnostic mammogram.   She denies signs of infection such as sore throat, sinus drainage, cough, or urinary symptoms.  She denies fevers or recurrent chills. She denies nausea, vomiting, chest pain, dyspnea or cough. Her appetite is great and her weight has increased 6 pounds over last 4 months .   REVIEW OF SYSTEMS:  Review of Systems  Constitutional:  Negative for appetite change, chills, diaphoresis, fatigue, fever and unexpected weight change.  HENT:  Negative.  Negative for lump/mass, mouth sores, nosebleeds, sore throat and trouble swallowing.   Eyes: Negative.  Negative for eye problems.  Respiratory: Negative.  Negative for chest tightness, cough, hemoptysis, shortness of breath and wheezing.   Cardiovascular: Negative.  Negative for chest pain, leg swelling and palpitations.  Gastrointestinal: Negative.  Negative for abdominal distention, abdominal pain, blood in stool, constipation, diarrhea, nausea and vomiting.  Endocrine: Positive for hot flashes.  Genitourinary: Negative.  Negative for difficulty urinating, dysuria, frequency and hematuria.   Musculoskeletal:  Negative for arthralgias, back pain, flank pain, gait problem and myalgias.  Skin: Negative.  Negative for itching and rash.  Neurological:  Positive for numbness (neuropathy, pain, of the feet). Negative for dizziness, extremity weakness, gait problem, headaches, light-headedness, seizures and speech difficulty.  Hematological: Negative.  Negative for adenopathy. Does not bruise/bleed easily.  Psychiatric/Behavioral: Negative.  Negative for depression and sleep disturbance. The patient is not nervous/anxious.     VITALS:  Blood pressure 124/62, pulse 71, temperature 98.4 F (36.9 C), temperature source Oral, resp. rate 16, height 5\' 7"  (1.702 m), weight 243 lb 1.6 oz (110.3 kg), last menstrual period  12/21/1998, SpO2 100%.  Wt Readings from Last 3 Encounters:  03/03/24 243 lb 1.6 oz (110.3 kg)  01/03/24 236 lb 6.4 oz (107.2 kg)  12/09/23 235 lb 6.4 oz (106.8 kg)    Body mass index is 38.07 kg/m.  Performance status (ECOG): 1 - Symptomatic but completely ambulatory  PHYSICAL EXAM:  Physical Exam Vitals and nursing note reviewed.  Constitutional:      General: She is not in acute distress.    Appearance: Normal appearance. She is normal weight. She is not ill-appearing, toxic-appearing or diaphoretic.  HENT:     Head: Normocephalic and atraumatic.     Right Ear: Tympanic membrane, ear canal and external ear normal. There is no impacted cerumen.     Left Ear: Tympanic membrane, ear canal and external ear normal.     Nose: Nose normal. No congestion or rhinorrhea.     Mouth/Throat:     Mouth: Mucous membranes are moist.     Pharynx: Oropharynx is clear. No oropharyngeal exudate or posterior oropharyngeal erythema.  Eyes:     General: No scleral icterus.       Right eye: No discharge.        Left eye: No discharge.     Extraocular Movements: Extraocular movements intact.     Conjunctiva/sclera: Conjunctivae normal.     Pupils: Pupils are equal, round, and reactive to light.  Cardiovascular:     Rate and Rhythm: Normal rate and regular rhythm.     Pulses: Normal pulses.  Heart sounds: Normal heart sounds. No murmur heard.    No friction rub. No gallop.  Pulmonary:     Effort: Pulmonary effort is normal. No respiratory distress.     Breath sounds: Normal breath sounds. No stridor. No wheezing, rhonchi or rales.  Chest:     Chest wall: No tenderness.  Breasts:    Right: Normal. No inverted nipple, mass, nipple discharge or skin change.     Left: Normal. No inverted nipple, mass, nipple discharge or skin change.     Comments: Well healed scar in the lower outer quadrant  of the right breast No masses in either breasts Abdominal:     General: Bowel sounds are normal.  There is no distension.     Palpations: Abdomen is soft. There is no hepatomegaly, splenomegaly or mass.     Tenderness: There is no abdominal tenderness. There is no right CVA tenderness, left CVA tenderness, guarding or rebound.     Hernia: No hernia is present.  Musculoskeletal:        General: No swelling. Normal range of motion.     Cervical back: Normal range of motion and neck supple. No rigidity.     Right lower leg: No edema.     Left lower leg: No edema.  Lymphadenopathy:     Cervical: No cervical adenopathy.     Right cervical: No superficial, deep or posterior cervical adenopathy.    Left cervical: No superficial, deep or posterior cervical adenopathy.     Upper Body:     Right upper body: No supraclavicular, axillary or pectoral adenopathy.     Left upper body: No supraclavicular, axillary or pectoral adenopathy.  Skin:    General: Skin is warm and dry.     Coloration: Skin is not jaundiced.     Findings: No rash.  Neurological:     General: No focal deficit present.     Mental Status: She is alert and oriented to person, place, and time. Mental status is at baseline.  Psychiatric:        Mood and Affect: Mood normal.        Behavior: Behavior normal.        Thought Content: Thought content normal.        Judgment: Judgment normal.    LABS:      Latest Ref Rng & Units 02/10/2023   11:07 AM 10/21/2022   10:52 AM 08/10/2022   12:00 AM  CBC  WBC 4.0 - 10.5 K/uL 4.9  5.3  4.4      Hemoglobin 12.0 - 15.0 g/dL 57.8  46.9  62.9      Hematocrit 36.0 - 46.0 % 40.6  40.7  38      Platelets 150 - 400 K/uL 153  181  155         This result is from an external source.      Latest Ref Rng & Units 09/29/2023   12:00 AM 06/09/2023   11:14 AM 02/10/2023   11:07 AM  CMP  Glucose 70 - 99 mg/dL  528  413   BUN 4 - 21 13     18  12    Creatinine 0.5 - 1.1 0.6     0.76  0.79   Sodium 137 - 147 134     139  134   Potassium 3.5 - 5.1 mEq/L 4.2     4.8  3.8   Chloride 99 - 108 99  100  97   CO2 13 - 22 32     25  29   Calcium 8.7 - 10.7 9.8     10.1  9.5   Total Protein 6.5 - 8.1 g/dL   7.4   Total Bilirubin 0.3 - 1.2 mg/dL   0.7   Alkaline Phos 25 - 125 85      60   AST 13 - 35 42      26   ALT 7 - 35 U/L 23      21      This result is from an external source.    Lab Results  Component Value Date   ALBUMINELP 4.1 08/30/2018   A1GS 0.3 08/30/2018   A2GS 0.8 08/30/2018   BETS 0.5 08/30/2018   BETA2SER 0.4 08/30/2018   GAMS 1.5 08/30/2018   SPEI  08/30/2018     Comment:     Normal Serum Protein Electrophoresis Pattern. No abnormal protein bands (M-protein) detected.     Lab Results  Component Value Date   LDH 175 02/10/2023   LDH 174 08/10/2022    STUDIES:  EXAM: 02/15/2024 DUAL X-RAY ABSORPTIOMETRY (DXA) FOR BONE MINERAL DENSITY AP Spine L1-L4 (L3) 02/15/2024 73.1 Normal 0.8 1.285 g/cm2 - - DualFemur Neck Left 02/15/2024 73.1 Normal -0.7 0.938 g/cm2 - -  HISTORY:   Allergies:  Allergies  Allergen Reactions   Doxycycline Other (See Comments)    Pancreatic issues   Hydromorphone Other (See Comments)    hydromorphone   Metronidazole Other (See Comments)    Cream  Burned face   Dilaudid [Hydromorphone Hcl] Anxiety    Current Medications: Current Outpatient Medications  Medication Sig Dispense Refill   ALPRAZolam (XANAX) 0.25 MG tablet Take 1 tablet (0.25 mg total) by mouth 3 (three) times daily as needed for anxiety. 30 tablet 0   anastrozole (ARIMIDEX) 1 MG tablet Take 1 tablet (1 mg total) by mouth daily. 90 tablet 1   aspirin EC 81 MG tablet Take 1 tablet (81 mg total) by mouth 2 (two) times daily. To be taken after surgery to prevent blood clots (Patient taking differently: Take 81 mg by mouth once. To be taken after surgery to prevent blood clots) 84 tablet 0   carboxymethylcellulose (REFRESH TEARS) 0.5 % SOLN Place 1 drop into both eyes 3 (three) times daily as needed (dry eyes).     cetirizine (ALLERGY, CETIRIZINE,) 10 MG  tablet Take 10 mg by mouth daily.     Continuous Glucose Receiver (DEXCOM G6 RECEIVER) DEVI Use to check blood sugars every morning fasting and 2 hours after largest meal 1 each 0   Continuous Glucose Sensor (DEXCOM G6 SENSOR) MISC Apply new sensor every 10 days.  Use to check blood sugars every morning fasting and 2 hours after largest meal 9 each 3   Continuous Glucose Transmitter (DEXCOM G6 TRANSMITTER) MISC Use to check blood sugars every morning fasting and 2 hours after largest meal 1 each 3   famotidine (PEPCID) 20 MG tablet TAKE 1 TABLET BY MOUTH TWICE  DAILY 200 tablet 2   fluticasone (FLONASE) 50 MCG/ACT nasal spray Place 1 spray into both nostrils daily as needed for allergies or rhinitis. 16 g 5   folic acid (FOLVITE) 400 MCG tablet Take 400 mcg by mouth daily.     gabapentin (NEURONTIN) 600 MG tablet TAKE 2 TABLETS BY MOUTH 3 TIMES  DAILY 540 tablet 3   insulin lispro (HUMALOG KWIKPEN) 100 UNIT/ML KwikPen Inject 20 Units  into the skin 3 (three) times daily. INJECT 20 UNITS 3 TIMES A DAY WITH MEALS 54 mL 1   LANTUS SOLOSTAR 100 UNIT/ML Solostar Pen INJECT 70 UNITS INTO THE SKIN DAILY. NEED OFFICE VISIT FOR FURTHER REFILLS 60 mL 3   losartan (COZAAR) 25 MG tablet TAKE 1 TABLET BY MOUTH DAILY 100 tablet 2   meloxicam (MOBIC) 15 MG tablet TAKE 1 TABLET (15 MG TOTAL) BY MOUTH DAILY. 60 tablet 2   Multiple Vitamin (MULTIVITAMIN) tablet Take 1 tablet by mouth daily.     NIACIN PO Take 1,000 mg by mouth at bedtime. Take 1 hour after the 81 mg aspirin     PIP PEN NEEDLES 31G X 31G X 5 MM MISC USE AS DIRECTED WITH INSULIN PENS (4 PER DAY)     No current facility-administered medications for this visit.   ASSESSMENT & PLAN:  Assessment: 1.  History of stage 0 ductal carcinoma in situ diagnosed in April, 2021, treated with lumpectomy. We offered chemoprevention with raloxifene  but she declined.  2.  History of stage IV diffuse large B-cell lymphoma diagnosed in May 2016, treated with 6  cycles of R-CHOP chemotherapy with a complete response.  Most recent imaging from October of 2024 did not reveal any evidence of lymphoma recurrence. As she is over 5 years from diagnosis, routine imaging is not indicated.   3.  Mediastinal node vs thyroid nodule, which has been stable. The radiologist feels it is more likely a thyroid nodule.    4. Small black mole in her left posterior upper shoulder which appears benign and measures 5 mm. We offered to have the dermatologist evaluate. The patient decided to continue observation.  5. Stage IA hormone receptor positive right breast invasive carcinoma.  EndoPredict in August revealed a low risk EpClin score of 2.7 for a 5.6% risk for recurrence in 0-10 years and 1.3% for an estimated absolute chemotherapy benefit at 10 years. Her likelihood of late distant recurrence in 5-15 years is 4.4%. She has completed radiation therapy in October, 2024. Bone density scan in 2024 is normal. She was placed her on anastrozole 1 mg daily in November, 2024.   6.  Family history of colon cancer. Genetic testing was negative. She will continue screening colonoscopy every 5 years. She is due for that now, so will schedule with her gastroenterologist.  Plan: She continues Gabapentin 600mg  2 tablets TID. She continues Anastrozole without difficulty and just experiences hot flashes. She requested to have her mammograms done here from now on and I can arrange that for her. I informed her that we are not doing diagnostic mammograms at the moment so I will schedule her at [facility] in [place] for this next one in May. Her last bone density was done on 02/15/2024 which was completely normal. I will see her back in 3 months with repeat bilateral diagnostic mammogram.   I provided 20 minutes of face-to-face time during this this encounter and > 50% was spent counseling as documented under my assessment and plan.   Gery Pray MD [facility]  CTR [facility]al[facility] Levan - A DEPT OF [facility] [address] [place] [address] Dept: [phone] Dept Fax: [phone]   No orders of the defined types were placed in this encounter.   I,Jasmine Davila,acting as a scribe for Dellia Beckwith, MD.,have documented all relevant documentation on the behalf of Jasmine BRITTINGHAM, MD,as directed by  Dellia Beckwith,  MD while in the presence of Dellia Beckwith, MD.

## 2024-03-03 NOTE — Telephone Encounter (Signed)
 Requested information faxed to [facility].

## 2024-03-06 ENCOUNTER — Other Ambulatory Visit: Payer: Self-pay | Admitting: Oncology

## 2024-03-06 DIAGNOSIS — E0821 Diabetes mellitus due to underlying condition with diabetic nephropathy: Secondary | ICD-10-CM | POA: Diagnosis not present

## 2024-03-06 DIAGNOSIS — C50511 Malignant neoplasm of lower-outer quadrant of right female breast: Secondary | ICD-10-CM

## 2024-03-06 DIAGNOSIS — Z794 Long term (current) use of insulin: Secondary | ICD-10-CM | POA: Diagnosis not present

## 2024-03-08 ENCOUNTER — Ambulatory Visit: Payer: Medicare Other | Admitting: Family Medicine

## 2024-03-24 ENCOUNTER — Ambulatory Visit (INDEPENDENT_AMBULATORY_CARE_PROVIDER_SITE_OTHER): Payer: Medicare Other | Admitting: Family Medicine

## 2024-03-24 ENCOUNTER — Encounter: Payer: Self-pay | Admitting: Family Medicine

## 2024-03-24 VITALS — BP 121/75 | HR 97 | Ht 67.0 in | Wt 236.1 lb

## 2024-03-24 DIAGNOSIS — Z794 Long term (current) use of insulin: Secondary | ICD-10-CM | POA: Diagnosis not present

## 2024-03-24 DIAGNOSIS — E1169 Type 2 diabetes mellitus with other specified complication: Secondary | ICD-10-CM

## 2024-03-24 DIAGNOSIS — E1142 Type 2 diabetes mellitus with diabetic polyneuropathy: Secondary | ICD-10-CM

## 2024-03-24 DIAGNOSIS — E0821 Diabetes mellitus due to underlying condition with diabetic nephropathy: Secondary | ICD-10-CM

## 2024-03-24 DIAGNOSIS — E785 Hyperlipidemia, unspecified: Secondary | ICD-10-CM

## 2024-03-24 LAB — POCT GLYCOSYLATED HEMOGLOBIN (HGB A1C): HbA1c POC (<> result, manual entry): 6.2 % (ref 4.0–5.6)

## 2024-03-24 NOTE — Patient Instructions (Addendum)
 It was nice to see you today,  We addressed the following topics today: -The medication we are discussing for your neuropathy is called Qutenza.  The only providers I know that prescribed this in the office are our physical medicine rehab doctors.  I would need to send in a referral to them for you to use this - It is applied every 3 months. - For your diabetes I would like you to do the following: 1..  Continue taking your Lantus in the morning 65 units 2.  Limit your short acting insulin with meals to 8 units each time. 3.  If your blood sugars are getting too high and you are not having the low blood sugars at night anymore you can increase the short acting up to 10 units but do not go higher than that and less talking to me first 4.  If you are still having low blood sugars with the 8 units short acting, try decreasing your evening dinner time short acting insulin to 4 units.  If needed you can go all the way down to stopping the evening dose completely as long as you do not have significantly high blood sugars as a result.  Have a great day,  Frederic Jericho, MD

## 2024-03-24 NOTE — Progress Notes (Signed)
   Established Patient Office Visit  Subjective   Patient ID: Jasmine Davila, female    DOB: Oct 02, 1951  Age: 73 y.o. MRN: [id number]  Chief Complaint  Patient presents with   Medical Management of Chronic Issues    HPI  DM2-patient takes Lantus 65 units in the a.m., usually around 7 to 9 AM and short acting Humalog with meals.  Patient's lunch is usually around 2-3 and dinner around 7 to 8 PM.  She feels like if her blood sugar "is not high enough" in the evening before bed she will "crash".  She has a continuous glucose monitor, Dexcom 6,.  She has a history of pancreatitis.  Patient gets her mammograms at the cancer center now.  She recently diagnosed with invasive ductal carcinoma.  Follows with oncology.  Patient continues to have neuropathy.  Takes gabapentin 1200 3 times daily.  We discussed other options including Qutenza.  Patient wishes to look into this further before trying it. The ASCVD Risk score (Arnett DK, et al., 2019) failed to calculate for the following reasons:   The valid total cholesterol range is 130 to 320 mg/dL  Health Maintenance Due  Topic Date Due   Hepatitis C Screening  Never done   DTaP/Tdap/Td (1 - Tdap) Never done   Zoster Vaccines- Shingrix (1 of 2) Never done   COVID-19 Vaccine (3 - Moderna risk series) 03/29/2020   OPHTHALMOLOGY EXAM  03/27/2021   Medicare Annual Wellness (AWV)  03/08/2024      Objective:     BP 121/75   Pulse 97   Ht 5\' 7"  (1.702 m)   Wt 236 lb 1.9 oz (107.1 kg)   LMP 12/21/1998 (Approximate)   SpO2 97%   BMI 36.98 kg/m    Physical Exam General: Alert, oriented CV: Regular rhythm Pulmonary: Lungs clear bilaterally   Results for orders placed or performed in visit on 03/24/24  POCT glycosylated hemoglobin (Hb A1C)  Result Value Ref Range   Hemoglobin A1C     HbA1c POC (<> result, manual entry) 6.2 4.0 - 5.6 %   HbA1c, POC (prediabetic range)     HbA1c, POC (controlled diabetic range)           Assessment & Plan:   Diabetes mellitus due to underlying condition with diabetic nephropathy, with long-term current use of insulin (HCC) Assessment & Plan: Still with some low blood sugar readings.  A1c this time was 6.2.  Will adjust her short acting insulin so that it is 8 units 3 times daily with meals.  Advised her if she still has low blood sugar readings in the evening or nighttime she should decrease her evening dose first.  Patient wears a Dexcom 6 because she prefers to wear it on her abdomen.  Follow-up in 3 months.   Hyperlipidemia associated with type 2 diabetes mellitus (HCC) -     POCT glycosylated hemoglobin (Hb A1C)  Diabetic peripheral neuropathy associated with type 2 diabetes mellitus (HCC) Assessment & Plan: Takes gabapentin 1200 3 times daily.  Discussed other options including Qutenza.  Patient wants to hold off on referral to PM and R for Qutenza until she can read about it some more.      Return in about 3 months (around 06/23/2024) for DM.    Sandre Kitty, MD

## 2024-03-26 ENCOUNTER — Other Ambulatory Visit: Payer: Self-pay | Admitting: Oncology

## 2024-03-26 DIAGNOSIS — F411 Generalized anxiety disorder: Secondary | ICD-10-CM

## 2024-03-27 MED ORDER — ALPRAZOLAM 0.25 MG PO TABS
0.2500 mg | ORAL_TABLET | Freq: Three times a day (TID) | ORAL | 0 refills | Status: DC | PRN
Start: 2024-03-27 — End: 2024-04-18

## 2024-03-27 NOTE — Assessment & Plan Note (Signed)
 Still with some low blood sugar readings.  A1c this time was 6.2.  Will adjust her short acting insulin so that it is 8 units 3 times daily with meals.  Advised her if she still has low blood sugar readings in the evening or nighttime she should decrease her evening dose first.  Patient wears a Dexcom 6 because she prefers to wear it on her abdomen.  Follow-up in 3 months.

## 2024-03-27 NOTE — Assessment & Plan Note (Signed)
 Takes gabapentin 1200 3 times daily.  Discussed other options including Qutenza.  Patient wants to hold off on referral to PM and R for Qutenza until she can read about it some more.

## 2024-03-31 DIAGNOSIS — H5319 Other subjective visual disturbances: Secondary | ICD-10-CM | POA: Diagnosis not present

## 2024-03-31 DIAGNOSIS — H35372 Puckering of macula, left eye: Secondary | ICD-10-CM | POA: Diagnosis not present

## 2024-03-31 DIAGNOSIS — E119 Type 2 diabetes mellitus without complications: Secondary | ICD-10-CM | POA: Diagnosis not present

## 2024-03-31 LAB — HM DIABETES EYE EXAM

## 2024-04-16 ENCOUNTER — Other Ambulatory Visit: Payer: Self-pay | Admitting: Oncology

## 2024-04-16 DIAGNOSIS — F411 Generalized anxiety disorder: Secondary | ICD-10-CM

## 2024-04-18 ENCOUNTER — Other Ambulatory Visit: Payer: Self-pay | Admitting: Oncology

## 2024-04-18 DIAGNOSIS — F411 Generalized anxiety disorder: Secondary | ICD-10-CM

## 2024-05-04 ENCOUNTER — Other Ambulatory Visit: Payer: Self-pay | Admitting: Hematology and Oncology

## 2024-05-04 DIAGNOSIS — C50511 Malignant neoplasm of lower-outer quadrant of right female breast: Secondary | ICD-10-CM

## 2024-05-13 ENCOUNTER — Other Ambulatory Visit: Payer: Self-pay | Admitting: Family Medicine

## 2024-05-13 DIAGNOSIS — E1142 Type 2 diabetes mellitus with diabetic polyneuropathy: Secondary | ICD-10-CM

## 2024-05-19 ENCOUNTER — Ambulatory Visit
Admission: RE | Admit: 2024-05-19 | Discharge: 2024-05-19 | Disposition: A | Discharge status: Home / Self Care | Source: Ambulatory Visit | Attending: Oncology | Admitting: Oncology

## 2024-05-19 DIAGNOSIS — C50511 Malignant neoplasm of lower-outer quadrant of right female breast: Secondary | ICD-10-CM | POA: Diagnosis not present

## 2024-05-19 DIAGNOSIS — Z170 Estrogen receptor positive status [ER+]: Secondary | ICD-10-CM | POA: Insufficient documentation

## 2024-05-19 DIAGNOSIS — R92323 Mammographic fibroglandular density, bilateral breasts: Secondary | ICD-10-CM | POA: Diagnosis not present

## 2024-06-01 ENCOUNTER — Inpatient Hospital Stay: Admitting: Oncology

## 2024-06-04 DIAGNOSIS — E1169 Type 2 diabetes mellitus with other specified complication: Secondary | ICD-10-CM | POA: Diagnosis not present

## 2024-06-07 ENCOUNTER — Inpatient Hospital Stay: Attending: Oncology | Admitting: Oncology

## 2024-06-07 ENCOUNTER — Other Ambulatory Visit: Payer: Self-pay

## 2024-06-07 ENCOUNTER — Encounter: Payer: Self-pay | Admitting: Oncology

## 2024-06-07 VITALS — BP 142/66 | HR 80 | Temp 98.2°F | Resp 18 | Ht 67.0 in | Wt 234.7 lb

## 2024-06-07 DIAGNOSIS — Z170 Estrogen receptor positive status [ER+]: Secondary | ICD-10-CM | POA: Insufficient documentation

## 2024-06-07 DIAGNOSIS — Z79899 Other long term (current) drug therapy: Secondary | ICD-10-CM | POA: Diagnosis not present

## 2024-06-07 DIAGNOSIS — C50511 Malignant neoplasm of lower-outer quadrant of right female breast: Secondary | ICD-10-CM

## 2024-06-07 DIAGNOSIS — Z881 Allergy status to other antibiotic agents status: Secondary | ICD-10-CM | POA: Insufficient documentation

## 2024-06-07 DIAGNOSIS — Z885 Allergy status to narcotic agent status: Secondary | ICD-10-CM | POA: Insufficient documentation

## 2024-06-07 DIAGNOSIS — Z1721 Progesterone receptor positive status: Secondary | ICD-10-CM | POA: Insufficient documentation

## 2024-06-07 DIAGNOSIS — N6081 Other benign mammary dysplasias of right breast: Secondary | ICD-10-CM | POA: Diagnosis not present

## 2024-06-07 DIAGNOSIS — D2260 Melanocytic nevi of unspecified upper limb, including shoulder: Secondary | ICD-10-CM | POA: Diagnosis not present

## 2024-06-07 DIAGNOSIS — Z8719 Personal history of other diseases of the digestive system: Secondary | ICD-10-CM | POA: Diagnosis not present

## 2024-06-07 DIAGNOSIS — R232 Flushing: Secondary | ICD-10-CM | POA: Diagnosis not present

## 2024-06-07 DIAGNOSIS — Z79811 Long term (current) use of aromatase inhibitors: Secondary | ICD-10-CM | POA: Diagnosis not present

## 2024-06-07 DIAGNOSIS — C50311 Malignant neoplasm of lower-inner quadrant of right female breast: Secondary | ICD-10-CM | POA: Insufficient documentation

## 2024-06-07 DIAGNOSIS — R200 Anesthesia of skin: Secondary | ICD-10-CM | POA: Insufficient documentation

## 2024-06-07 DIAGNOSIS — G629 Polyneuropathy, unspecified: Secondary | ICD-10-CM | POA: Insufficient documentation

## 2024-06-07 DIAGNOSIS — Z923 Personal history of irradiation: Secondary | ICD-10-CM | POA: Diagnosis not present

## 2024-06-07 DIAGNOSIS — Z7982 Long term (current) use of aspirin: Secondary | ICD-10-CM | POA: Insufficient documentation

## 2024-06-07 DIAGNOSIS — Z1732 Human epidermal growth factor receptor 2 negative status: Secondary | ICD-10-CM | POA: Insufficient documentation

## 2024-06-07 DIAGNOSIS — Z800 Family history of malignant neoplasm of digestive organs: Secondary | ICD-10-CM | POA: Diagnosis not present

## 2024-06-07 DIAGNOSIS — R5383 Other fatigue: Secondary | ICD-10-CM | POA: Diagnosis not present

## 2024-06-07 DIAGNOSIS — F411 Generalized anxiety disorder: Secondary | ICD-10-CM

## 2024-06-08 ENCOUNTER — Telehealth: Payer: Self-pay | Admitting: Oncology

## 2024-06-08 MED ORDER — ALPRAZOLAM 0.25 MG PO TABS: 0.2500 mg | ORAL_TABLET | Freq: Three times a day (TID) | ORAL | 1 refills | Status: DC | PRN

## 2024-06-08 NOTE — Telephone Encounter (Signed)
 Contacted pt to schedule an appt per 06/08/24 LOS. Unable to reach via phone, voicemail was left.

## 2024-06-14 NOTE — Progress Notes (Signed)
 [facility]  [address] [place],  [place]  [address] [phone] TELEVISIT:   I connected with Jasmine Davila by telephone visit and verified that I am speaking with the correct person using two identifiers.    I discussed the limitations, risks, security and privacy concerns of performing an evaluation and management service by telemedicine and the availability of in-person appointments. I also discussed with the patient that there may be a patient responsible charge related to this service. The patient expressed understanding and agreed to proceed.    Other persons participating in the visit and their role in the encounter:  None Patient's location:  Home Provider's location: Home    Clinic Day: 06/07/24  Referring physician: Chandra Toribio POUR, MD  CHIEF COMPLAINT:  CC:  Stage IA hormone receptor positive breast cancer  Current Treatment:   Adjuvant hormonal therapy  HISTORY OF PRESENT ILLNESS:  Jasmine Davila is a 73 y.o. 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 Annual screening mammogram revealed an area of calcifications with in the right breast working further evaluation.  Diagnostic unilateral right mammogram in May confirmed an area of pleomorphic calcifications spanning 1.1 cm in the lower inner quadrant of the right breast.  Stereotactic biopsy revealed intermediate grade, ductal carcinoma in situ with necrosis.  Fibrocystic changes including florid and micropapillary usual ductal hyperplasia and micropapillary apocrine metaplasia were also seen.  No invasive carcinoma was identified.  Estrogen receptor was positive at 90% and progesterone receptor was positive at 95%.  She underwent lumpectomy in June.  Surgical pathology confirmed a residual 1 mm  ductal carcinoma in situ, intermediate grade.  No invasive carcinoma identified and margins were free of neoplasm. We recommended  chemoprevention with raloxifene, however, she declined.  She also has a history of stage IV diffuse large B-cell lymphoma diagnosed in May 2016, treated with 6 cycles of R-CHOP with a complete response.  She has remained without evidence of recurrence. She undergoes screening colonoscopy every 5 years due to a family history of colon cancer. He last colonoscopy was  in November 2020. Bone density scan in June 2020 was normal.  She has had pancreatitis on and off since 2008.  Annual mammography from May 2022 was clear. CT imaging of the chest, abdomen and pelvis from August 2022 revealed no evidence of metastatic breast cancer or recurrence of lymphoma.   New stage IA (T1c N0 M0) invasive ductal carcinoma of the right breast diagnosed in June 2024.  There was a possible mass in the right breast on screening mammogram in June. Right diagnostic right mammogram and ultrasound in June revealed a 1.2 cm mass in the right lower quadrant. The right axilla was negative on ultrasound. Biopsy revealed grade 2, invasive ductal carcinoma. Estrogen and progesterone receptors were positive and HER2 negative. Ki67 was 30%. She was treated with in July. Pathology revealed 1.5 cm, grade 2,  invasive ductal carcinoma with ductal carcinoma in situ. Margins were negative. EndoPredict in August revealed a low risk Epclin score of 2.7 which corresponds with a 5.6% for recurrence in 0-10 years and a 1.3% for an estimated absolute chemotherapy benefit at 10 years, as well as a  likelihood of late distant recurrence in 5-15 years is 4.4%.   INTERVAL HISTORY:  Jasmine Davila is here for follow-up of her new stage IA hormone receptor positive invasive ductal carcinoma of the right breast diagnosed in June, 2024. She  completed radiation to the right breast in October.  She also had a ductal carcinoma in situ diagnosed in 2021 and has a history of diffuse large B-cell lymphoma in 2016. She had genetic testing and this did not reveal any  clinically significant mutation or variants of uncertain significance. She was started on hormonal therapy with Anastrozole  in November, 2024. Patient states that she feels well but complains of painful neuropathy of both feet especially the dorsal surface. She continues Gabapentin  600 mg 2 tablets TID.  She complains of fatigue but has also been taking care of her granddaughter so has been busy.  She continues Anastrozole  without difficulty and just experiences hot flashes. She requested to have her mammograms done here from now on and I can arrange that for her. Diagnostic mammogram was done 05/19/24 revealed interval postlumpectomy changes in the upper-outer RIGHT breast, with no mammographic evidence of malignancy in bilateral breasts.  It was recommended that she have a repeat diagnostic mammogram in 1 year. Her last bone density was done on 02/15/2024 which was completely normal. I will see her back in 4 months.  She will have labs with her PCP in July.  She denies signs of infection such as sore throat, sinus drainage, cough, or urinary symptoms.  She denies fevers or recurrent chills. She denies nausea, vomiting, chest pain, dyspnea or cough. Her appetite is great and her weight has increased 6 pounds over last 4 months.   REVIEW OF SYSTEMS:  Review of Systems  Constitutional:  Negative for appetite change, chills, diaphoresis, fatigue, fever and unexpected weight change.  HENT:  Negative.  Negative for lump/mass, mouth sores, nosebleeds, sore throat and trouble swallowing.   Eyes: Negative.  Negative for eye problems.  Respiratory: Negative.  Negative for chest tightness, cough, hemoptysis, shortness of breath and wheezing.   Cardiovascular: Negative.  Negative for chest pain, leg swelling and palpitations.  Gastrointestinal: Negative.  Negative for abdominal distention, abdominal pain, blood in stool, constipation, diarrhea, nausea and vomiting.  Endocrine: Positive for hot flashes.   Genitourinary: Negative.  Negative for difficulty urinating, dysuria, frequency and hematuria.   Musculoskeletal:  Negative for arthralgias, back pain, flank pain, gait problem and myalgias.  Skin: Negative.  Negative for itching and rash.  Neurological:  Positive for numbness (neuropathy, pain, of the feet). Negative for dizziness, extremity weakness, gait problem, headaches, light-headedness, seizures and speech difficulty.  Hematological: Negative.  Negative for adenopathy. Does not bruise/bleed easily.  Psychiatric/Behavioral: Negative.  Negative for depression and sleep disturbance. The patient is not nervous/anxious.     VITALS:  Blood pressure (!) 142/66, pulse 80, temperature 98.2 F (36.8 C), temperature source Oral, resp. rate 18, height 5' 7 (1.702 m), weight 234 lb 11.2 oz (106.5 kg), last menstrual period 12/21/1998, SpO2 96%.  Wt Readings from Last 3 Encounters:  06/07/24 234 lb 11.2 oz (106.5 kg)  03/24/24 236 lb 1.9 oz (107.1 kg)  03/03/24 243 lb 1.6 oz (110.3 kg)    Body mass index is 36.76 kg/m.  Performance status (ECOG): 1 - Symptomatic but completely ambulatory  PHYSICAL EXAM:  No Physical examination done since this was a telephone visit.   Physical Exam Vitals and nursing note reviewed.  Constitutional:      General: She is not in acute distress.    Appearance: Normal appearance. She is normal weight. She is not ill-appearing, toxic-appearing or diaphoretic.  HENT:     Head: Normocephalic and atraumatic.     Right Ear: Tympanic membrane, ear  canal and external ear normal. There is no impacted cerumen.     Left Ear: Tympanic membrane, ear canal and external ear normal.     Nose: Nose normal. No congestion or rhinorrhea.     Mouth/Throat:     Mouth: Mucous membranes are moist.     Pharynx: Oropharynx is clear. No oropharyngeal exudate or posterior oropharyngeal erythema.  Eyes:     General: No scleral icterus.       Right eye: No discharge.        Left  eye: No discharge.     Extraocular Movements: Extraocular movements intact.     Conjunctiva/sclera: Conjunctivae normal.     Pupils: Pupils are equal, round, and reactive to light.  Cardiovascular:     Rate and Rhythm: Normal rate and regular rhythm.     Pulses: Normal pulses.     Heart sounds: Normal heart sounds. No murmur heard.    No friction rub. No gallop.  Pulmonary:     Effort: Pulmonary effort is normal. No respiratory distress.     Breath sounds: Normal breath sounds. No stridor. No wheezing, rhonchi or rales.  Chest:     Chest wall: No tenderness.  Breasts:    Right: Normal. No inverted nipple, mass, nipple discharge or skin change.     Left: Normal. No inverted nipple, mass, nipple discharge or skin change.     Comments: Well healed scar in the lower outer quadrant  of the right breast No masses in either breasts Abdominal:     General: Bowel sounds are normal. There is no distension.     Palpations: Abdomen is soft. There is no hepatomegaly, splenomegaly or mass.     Tenderness: There is no abdominal tenderness. There is no right CVA tenderness, left CVA tenderness, guarding or rebound.     Hernia: No hernia is present.  Musculoskeletal:        General: No swelling. Normal range of motion.     Cervical back: Normal range of motion and neck supple. No rigidity.     Right lower leg: No edema.     Left lower leg: No edema.  Lymphadenopathy:     Cervical: No cervical adenopathy.     Right cervical: No superficial, deep or posterior cervical adenopathy.    Left cervical: No superficial, deep or posterior cervical adenopathy.     Upper Body:     Right upper body: No supraclavicular, axillary or pectoral adenopathy.     Left upper body: No supraclavicular, axillary or pectoral adenopathy.  Skin:    General: Skin is warm and dry.     Coloration: Skin is not jaundiced.     Findings: No rash.  Neurological:     General: No focal deficit present.     Mental Status: She is  alert and oriented to person, place, and time. Mental status is at baseline.  Psychiatric:        Mood and Affect: Mood normal.        Behavior: Behavior normal.        Thought Content: Thought content normal.        Judgment: Judgment normal.    LABS:      Latest Ref Rng & Units 02/10/2023   11:07 AM 10/21/2022   10:52 AM 08/10/2022   12:00 AM  CBC  WBC 4.0 - 10.5 K/uL 4.9  5.3  4.4      Hemoglobin 12.0 - 15.0 g/dL 86.4  85.8  12.7  Hematocrit 36.0 - 46.0 % 40.6  40.7  38      Platelets 150 - 400 K/uL 153  181  155         This result is from an external source.      Latest Ref Rng & Units 09/29/2023   12:00 AM 06/09/2023   11:14 AM 02/10/2023   11:07 AM  CMP  Glucose 70 - 99 mg/dL  870  860   BUN 4 - 21 13     18  12    Creatinine 0.5 - 1.1 0.6     0.76  0.79   Sodium 137 - 147 134     139  134   Potassium 3.5 - 5.1 mEq/L 4.2     4.8  3.8   Chloride 99 - 108 99     100  97   CO2 13 - 22 32     25  29   Calcium 8.7 - 10.7 9.8     10.1  9.5   Total Protein 6.5 - 8.1 g/dL   7.4   Total Bilirubin 0.3 - 1.2 mg/dL   0.7   Alkaline Phos 25 - 125 85      60   AST 13 - 35 42      26   ALT 7 - 35 U/L 23      21      This result is from an external source.    Lab Results  Component Value Date   ALBUMINELP 4.1 08/30/2018   A1GS 0.3 08/30/2018   A2GS 0.8 08/30/2018   BETS 0.5 08/30/2018   BETA2SER 0.4 08/30/2018   GAMS 1.5 08/30/2018   SPEI  08/30/2018     Comment:     Normal Serum Protein Electrophoresis Pattern. No abnormal protein bands (M-protein) detected.     Lab Results  Component Value Date   LDH 175 02/10/2023   LDH 174 08/10/2022    STUDIES:  Diagnostic Mammogram 05/19/24 FINDINGS: RIGHT:   Interval parenchymal redistribution with density and architectural distortion in the upper outer right breast middle depth, consistent with post lumpectomy changes. No suspicious density or calcifications are visualized on the spot magnification view of  the lumpectomy bed.   Stable post lumpectomy changes in the inner right breast. No new suspicious mass, calcification, or other findings are identified in the right breast.   LEFT:   No new suspicious mass, calcification, or other findings are identified in the left breast.   IMPRESSION: 1. Interval postlumpectomy changes in the upper-outer RIGHT breast. 2. No mammographic evidence of malignancy in bilateral breasts.   RECOMMENDATION: BILATERAL diagnostic mammogram in 12 months to continue with post lumpectomy surveillance protocol.    EXAM: 02/15/2024 DUAL X-RAY ABSORPTIOMETRY (DXA) FOR BONE MINERAL DENSITY AP Spine L1-L4 (L3) 02/15/2024 73.1 Normal 0.8 1.285 g/cm2 - - DualFemur Neck Left 02/15/2024 73.1 Normal -0.7 0.938 g/cm2 - -  HISTORY:   Allergies:  Allergies  Allergen Reactions   Doxycycline Other (See Comments)    Pancreatic issues   Hydromorphone  Other (See Comments)    hydromorphone    Metronidazole Other (See Comments)    Cream  Burned face   Other Other (See Comments)   Dilaudid  [Hydromorphone  Hcl] Anxiety    Current Medications: Current Outpatient Medications  Medication Sig Dispense Refill   ALPRAZolam  (XANAX ) 0.25 MG tablet Take 1 tablet (0.25 mg total) by mouth 3 (three) times daily as needed for anxiety. 60 tablet 1   anastrozole  (  ARIMIDEX ) 1 MG tablet TAKE 1 TABLET BY MOUTH DAILY. 90 tablet 2   aspirin  EC 81 MG tablet Take 1 tablet (81 mg total) by mouth 2 (two) times daily. To be taken after surgery to prevent blood clots (Patient taking differently: Take 81 mg by mouth once. To be taken after surgery to prevent blood clots) 84 tablet 0   carboxymethylcellulose (REFRESH TEARS) 0.5 % SOLN Place 1 drop into both eyes 3 (three) times daily as needed (dry eyes).     cetirizine (ALLERGY, CETIRIZINE,) 10 MG tablet Take 10 mg by mouth daily.     Continuous Glucose Receiver (DEXCOM G6 RECEIVER) DEVI Use to check blood sugars every morning fasting and 2  hours after largest meal 1 each 0   Continuous Glucose Sensor (DEXCOM G6 SENSOR) MISC Apply new sensor every 10 days.  Use to check blood sugars every morning fasting and 2 hours after largest meal 9 each 3   Continuous Glucose Transmitter (DEXCOM G6 TRANSMITTER) MISC Use to check blood sugars every morning fasting and 2 hours after largest meal 1 each 3   famotidine  (PEPCID ) 20 MG tablet TAKE 1 TABLET BY MOUTH TWICE  DAILY 200 tablet 2   fluticasone  (FLONASE ) 50 MCG/ACT nasal spray Place 1 spray into both nostrils daily as needed for allergies or rhinitis. 16 g 5   folic acid  (FOLVITE ) 400 MCG tablet Take 400 mcg by mouth daily.     gabapentin  (NEURONTIN ) 600 MG tablet TAKE 2 TABLETS BY MOUTH 3 TIMES  DAILY 600 tablet 2   insulin  lispro (HUMALOG  KWIKPEN) 100 UNIT/ML KwikPen Inject 20 Units into the skin 3 (three) times daily. INJECT 20 UNITS 3 TIMES A DAY WITH MEALS 54 mL 1   LANTUS  SOLOSTAR 100 UNIT/ML Solostar Pen INJECT 70 UNITS INTO THE SKIN DAILY. NEED OFFICE VISIT FOR FURTHER REFILLS 60 mL 3   losartan  (COZAAR ) 25 MG tablet TAKE 1 TABLET BY MOUTH DAILY 100 tablet 2   Multiple Vitamin (MULTIVITAMIN) tablet Take 1 tablet by mouth daily.     NIACIN  PO Take 1,000 mg by mouth at bedtime. Take 1 hour after the 81 mg aspirin      PIP PEN NEEDLES 31G X 31G X 5 MM MISC USE AS DIRECTED WITH INSULIN  PENS (4 PER DAY)     No current facility-administered medications for this visit.   ASSESSMENT & PLAN:  Assessment: 1.  History of stage 0 ductal carcinoma in situ diagnosed in April, 2021, treated with lumpectomy. We offered chemoprevention with raloxifene  but she declined.  2.  History of stage IV diffuse large B-cell lymphoma diagnosed in May 2016, treated with 6 cycles of R-CHOP chemotherapy with a complete response.  Most recent imaging from October of 2024 did not reveal any evidence of lymphoma recurrence. As she is over 5 years from diagnosis, routine imaging is not indicated.   3.   Mediastinal node vs thyroid  nodule, which has been stable. The radiologist feels it is more likely a thyroid  nodule.    4. Small black mole in her left posterior upper shoulder which appears benign and measures 5 mm. We offered to have the dermatologist evaluate. The patient decided to continue observation.  5. Stage IA hormone receptor positive right breast invasive carcinoma.  EndoPredict in August revealed a low risk EpClin score of 2.7 for a 5.6% risk for recurrence in 0-10 years and 1.3% for an estimated absolute chemotherapy benefit at 10 years. Her likelihood of late distant recurrence in 5-15  years is 4.4%. She has completed radiation therapy in October, 2024. Bone density scan in 2024 is normal. She was placed her on anastrozole  1 mg daily in November, 2024.   6.  Family history of colon cancer. Genetic testing was negative. She will continue screening colonoscopy every 5 years. She is due for that now, so will schedule with her gastroenterologist.  Plan: She had genetic testing and this did not reveal any clinically significant mutation or variants of uncertain significance, despite having 3 malignancies now. She was started on hormonal therapy with Anastrozole  in November, 2024. Patient states that she feels well but complains of painful neuropathy of both feet especially the dorsal surface. She continues Gabapentin  600 mg 2 tablets TID.  She complains of fatigue but has also been taking care of her granddaughter so has been busy.  She continues Anastrozole  without difficulty and just experiences hot flashes. She requested to have her mammograms done here from now on and I can arrange that for her. Diagnostic mammogram was done 05/19/24 revealed interval postlumpectomy changes in the upper-outer RIGHT breast, with no mammographic evidence of malignancy in bilateral breasts.  It was recommended that she have a repeat diagnostic mammogram in 1 year. Her last bone density was done on 02/15/2024 which  was completely normal. I will see her back in 4 months.  She will have labs with her PCP in July.  She understands and agrees with this plan of care.   I provided 10 minutes of telephone time during this this encounter and 100 % was spent counseling as documented under my assessment and plan.   Wanda Cornish MD [facility] [address] [place] [address] Dept: [phone] Dept Fax: [phone]   No orders of the defined types were placed in this encounter.

## 2024-06-26 ENCOUNTER — Encounter: Payer: Self-pay | Admitting: Family Medicine

## 2024-06-26 ENCOUNTER — Ambulatory Visit (INDEPENDENT_AMBULATORY_CARE_PROVIDER_SITE_OTHER): Admitting: Family Medicine

## 2024-06-26 ENCOUNTER — Other Ambulatory Visit: Payer: Self-pay | Admitting: Family Medicine

## 2024-06-26 VITALS — BP 131/70 | HR 74 | Ht 67.0 in | Wt 237.2 lb

## 2024-06-26 DIAGNOSIS — E1142 Type 2 diabetes mellitus with diabetic polyneuropathy: Secondary | ICD-10-CM | POA: Diagnosis not present

## 2024-06-26 DIAGNOSIS — M1711 Unilateral primary osteoarthritis, right knee: Secondary | ICD-10-CM

## 2024-06-26 DIAGNOSIS — E0821 Diabetes mellitus due to underlying condition with diabetic nephropathy: Secondary | ICD-10-CM | POA: Diagnosis not present

## 2024-06-26 DIAGNOSIS — Z794 Long term (current) use of insulin: Secondary | ICD-10-CM

## 2024-06-26 DIAGNOSIS — E785 Hyperlipidemia, unspecified: Secondary | ICD-10-CM | POA: Diagnosis not present

## 2024-06-26 DIAGNOSIS — E1169 Type 2 diabetes mellitus with other specified complication: Secondary | ICD-10-CM | POA: Diagnosis not present

## 2024-06-26 DIAGNOSIS — G8929 Other chronic pain: Secondary | ICD-10-CM

## 2024-06-26 DIAGNOSIS — M25561 Pain in right knee: Secondary | ICD-10-CM

## 2024-06-26 DIAGNOSIS — Z96651 Presence of right artificial knee joint: Secondary | ICD-10-CM | POA: Diagnosis not present

## 2024-06-26 DIAGNOSIS — E538 Deficiency of other specified B group vitamins: Secondary | ICD-10-CM | POA: Diagnosis not present

## 2024-06-26 LAB — POCT GLYCOSYLATED HEMOGLOBIN (HGB A1C): Hemoglobin A1C: 6 % — AB (ref 4.0–5.6)

## 2024-06-26 NOTE — Patient Instructions (Signed)
 It was nice to see you today,  We addressed the following topics today: -I am sending in a referral to the orthopedist at [facility]. - We will get some labs prior to your next visit which will be a physical. - I will request records from your ophthalmologist regarding your eye exam. - No changes to your medications today - The Qutenza we talked about in the past is a patch that you wear for 30 minutes at a doctor's office.  It is not a cream that you have to put on.  This can be repeated every 3 months if it is helpful.  If you want to try this I would need to send you a referral to the physical medicine rehab specialist.  Have a great day,  Rolan Slain, MD

## 2024-06-26 NOTE — Assessment & Plan Note (Signed)
 Well-controlled with an A1c of 6.0. Reports adjusting short-acting insulin  as needed without significant hypoglycemia. - Continue current insulin  regimen. - Check labs (cholesterol, kidney function) one week prior to the next visit. - Perform urine microalbumin today. - Schedule next visit in 3 months as an annual physical. - Diabetic foot exam performed today.

## 2024-06-26 NOTE — Progress Notes (Signed)
 Established Patient Office Visit  Subjective   Patient ID: Jasmine Davila, female    DOB: 09/15/1951  Age: 73 y.o. MRN: [id number]  Chief Complaint  Patient presents with   Diabetes    HPI  Subjective - Reports no significant changes since the last visit three months ago. - adjusting short-acting insulin  dose, as 8-10 units is sometimes insufficient. Not experiencing hypoglycemic episodes at night. - Has had low blood sugar during the day with increased physical activity, like house cleaning and yard work. Manages this with candy or dextrose  tablets, noting candy works faster. - Reports no other changes to medications. - Had an annual eye exam with a retina specialist, Dr. Tobie, in March. Considers returning to Dr. Bryn due to ongoing symptoms of light sensitivity, tearing, and blurriness, even with glasses. Attributes some symptoms to chemo brain and gabapentin . - Follows up with oncology every four months, recently changed from every three months after a good mammogram. No changes from the oncologist. - Due for a repeat colonoscopy this year. Usually on a five-year schedule due to family history. Last one was with [facility]. Will call to schedule. - Neuropathy is described as horrible. Declined a trial of the Qutenza patch due to concerns about side effects,  - Reports persistent pain in the right knee, ongoing for over two years since a knee replacement. Pain is localized to the inner aspect of the knee and is constant. Believes it started after starting PT post tka. X-rays by the surgeon, Dr. Patria Annemarie), were normal, but questions if an x-ray is sufficient. No history of a fall or new injury.  Medications Short-acting insulin  8-10 units prn, long-acting insulin  65 units, gabapentin .  PMH, PSH, FH, Social Hx PMHx: Diabetes Mellitus Type 2, neuropathy, history of cancer, knee replacement (right, >2 years ago).  ROS Eyes: Reports light sensitivity, tearing, and  blurriness. MSK: Reports persistent right knee pain. Denies recent falls or injury. Neuro: Reports ongoing peripheral neuropathy in feet and hands.   The ASCVD Risk score (Arnett DK, et al., 2019) failed to calculate for the following reasons:   The valid total cholesterol range is 130 to 320 mg/dL  Health Maintenance Due  Topic Date Due   Hepatitis C Screening  Never done   DTaP/Tdap/Td (1 - Tdap) Never done   Zoster Vaccines- Shingrix (1 of 2) Never done   COVID-19 Vaccine (3 - Moderna risk series) 03/29/2020   OPHTHALMOLOGY EXAM  03/27/2021   Medicare Annual Wellness (AWV)  03/08/2024   Diabetic kidney evaluation - Urine ACR  06/08/2024      Objective:     BP 131/70   Pulse 74   Ht 5' 7 (1.702 m)   Wt 237 lb 4 oz (107.6 kg)   LMP 12/21/1998 (Approximate)   SpO2 97%   BMI 37.16 kg/m    Physical Exam Gen: alert, oriented Pulm: no resp distress Psych: pleasant affect   Results for orders placed or performed in visit on 06/26/24  POCT HgB A1C  Result Value Ref Range   Hemoglobin A1C 6.0 (A) 4.0 - 5.6 %   HbA1c POC (<> result, manual entry)     HbA1c, POC (prediabetic range)     HbA1c, POC (controlled diabetic range)          Assessment & Plan:   Diabetes mellitus due to underlying condition with diabetic nephropathy, with long-term current use of insulin  (HCC) Assessment & Plan: Well-controlled with an A1c of 6.0. Reports adjusting short-acting insulin   as needed without significant hypoglycemia. - Continue current insulin  regimen. - Check labs (cholesterol, kidney function) one week prior to the next visit. - Perform urine microalbumin today. - Schedule next visit in 3 months as an annual physical. - Diabetic foot exam performed today.  Orders: -     POCT glycosylated hemoglobin (Hb A1C) -     Microalbumin / creatinine urine ratio -     Comprehensive metabolic panel with GFR; Future -     Lipid panel; Future -     Hemoglobin A1c; Future -     TSH;  Future  Hyperlipidemia associated with type 2 diabetes mellitus (HCC) -     Lipid panel; Future  B12 deficiency -     CBC with Differential/Platelet; Future  Chronic knee pain after total replacement of right knee joint -     Ambulatory referral to Orthopedic Surgery  Diabetic peripheral neuropathy associated with type 2 diabetes mellitus (HCC) Assessment & Plan:  Reports significant, ongoing neuropathy in hands and feet. - Discussed Qutenza patch again, clarifying it is an in-office, long-acting treatment, not a daily cream. Provided information on the treatment. - Patient will consider and message if they decide to proceed.   Primary osteoarthritis of right knee Assessment & Plan: Persistent pain post-TKA (>2 years ago). Has seen her orthopedist in that time but has not had significant workup of knee pain.  - Patient requests a second opinion. - Refer to [facility] in [place] for evaluation.      Return in about 3 months (around 09/26/2024) for physical.    Toribio MARLA Slain, MD

## 2024-06-26 NOTE — Assessment & Plan Note (Signed)
 Reports significant, ongoing neuropathy in hands and feet. - Discussed Qutenza patch again, clarifying it is an in-office, long-acting treatment, not a daily cream. Provided information on the treatment. - Patient will consider and message if they decide to proceed.

## 2024-06-26 NOTE — Assessment & Plan Note (Signed)
 Persistent pain post-TKA (>2 years ago). Has seen her orthopedist in that time but has not had significant workup of knee pain.  - Patient requests a second opinion. - Refer to [facility] in [place] for evaluation.

## 2024-06-27 LAB — MICROALBUMIN / CREATININE URINE RATIO
Creatinine, Urine: 90.1 mg/dL
Microalb/Creat Ratio: 15 mg/g{creat} (ref 0–29)
Microalbumin, Urine: 13.2 ug/mL

## 2024-06-27 LAB — SPECIMEN STATUS REPORT

## 2024-06-30 ENCOUNTER — Telehealth: Payer: Self-pay | Admitting: Orthopaedic Surgery

## 2024-06-30 NOTE — Telephone Encounter (Signed)
 Patient needs records from 06/23 surgery to [facility] in [place]. I emailed patient Jasmine Davila to complete. Once received, I will process [email or web address]

## 2024-07-04 ENCOUNTER — Telehealth: Payer: Self-pay | Admitting: Orthopaedic Surgery

## 2024-07-04 NOTE — Telephone Encounter (Signed)
 Please copy xrays from 07/22/2022 & 12/02/2022 to CD. Please call pt when ready [phone] gelene received) Thank you!

## 2024-07-05 NOTE — Telephone Encounter (Signed)
Patient is aware CD is ready for pickup

## 2024-07-10 ENCOUNTER — Other Ambulatory Visit: Payer: Self-pay | Admitting: Orthopedic Surgery

## 2024-07-10 DIAGNOSIS — Z96651 Presence of right artificial knee joint: Secondary | ICD-10-CM | POA: Diagnosis not present

## 2024-07-11 ENCOUNTER — Other Ambulatory Visit: Payer: Self-pay | Admitting: *Deleted

## 2024-07-11 ENCOUNTER — Other Ambulatory Visit

## 2024-07-11 DIAGNOSIS — M25561 Pain in right knee: Secondary | ICD-10-CM | POA: Diagnosis not present

## 2024-07-11 DIAGNOSIS — E1159 Type 2 diabetes mellitus with other circulatory complications: Secondary | ICD-10-CM

## 2024-07-11 DIAGNOSIS — E1169 Type 2 diabetes mellitus with other specified complication: Secondary | ICD-10-CM

## 2024-07-11 DIAGNOSIS — G8929 Other chronic pain: Secondary | ICD-10-CM

## 2024-07-11 DIAGNOSIS — E0821 Diabetes mellitus due to underlying condition with diabetic nephropathy: Secondary | ICD-10-CM | POA: Diagnosis not present

## 2024-07-11 DIAGNOSIS — E538 Deficiency of other specified B group vitamins: Secondary | ICD-10-CM | POA: Diagnosis not present

## 2024-07-11 DIAGNOSIS — Z96651 Presence of right artificial knee joint: Secondary | ICD-10-CM | POA: Diagnosis not present

## 2024-07-11 NOTE — Addendum Note (Signed)
 Addended by: CHANDRA TORIBIO POUR on: 07/11/2024 11:02 AM   Modules accepted: Orders

## 2024-07-12 LAB — SEDIMENTATION RATE: Sed Rate: 4 mm/h (ref 0–40)

## 2024-07-12 LAB — C-REACTIVE PROTEIN: CRP: 3 mg/L (ref 0–10)

## 2024-07-13 ENCOUNTER — Ambulatory Visit (INDEPENDENT_AMBULATORY_CARE_PROVIDER_SITE_OTHER)

## 2024-07-13 DIAGNOSIS — Z0000 Encounter for general adult medical examination without abnormal findings: Secondary | ICD-10-CM

## 2024-07-13 NOTE — Progress Notes (Signed)
 Subjective:   Jasmine Davila is a 73 y.o. who presents for a Medicare Wellness preventive visit.  As a reminder, Annual Wellness Visits don't include a physical exam, and some assessments may be limited, especially if this visit is performed virtually. We may recommend an in-person follow-up visit with your provider if needed.  Visit Complete: Virtual I connected with  Jasmine Davila on 07/13/24 by a audio enabled telemedicine application and verified that I am speaking with the correct person using two identifiers.  Patient Location: Home  Provider Location: Home Office  I discussed the limitations of evaluation and management by telemedicine. The patient expressed understanding and agreed to proceed.  Vital Signs: Because this visit was a virtual/telehealth visit, some criteria may be missing or patient reported. Any vitals not documented were not able to be obtained and vitals that have been documented are patient reported.    Persons Participating in Visit: Patient.  AWV Questionnaire: Yes: Patient Medicare AWV questionnaire was completed by the patient on 07/10/2024; I have confirmed that all information answered by patient is correct and no changes since this date.  Cardiac Risk Factors include: diabetes mellitus;dyslipidemia;hypertension     Objective:    Today's Vitals   07/13/24 1043  PainSc: 5    There is no height or weight on file to calculate BMI.     07/13/2024   10:50 AM 11/10/2023   11:26 AM 02/10/2023   11:34 AM 08/10/2022   11:39 AM 07/16/2022   11:21 AM 06/08/2022    4:18 PM 05/28/2022   10:27 AM  Advanced Directives  Does Patient Have a Medical Advance Directive? Yes Yes Yes Yes Yes Yes Yes  Type of Estate agent of [place];Living will Healthcare Power of Attorney Living will   Living will;Healthcare Power of Attorney Living will;Healthcare Power of Attorney  Does patient want to make changes to medical advance  directive?      No - Patient declined No - Patient declined  Copy of Healthcare Power of Attorney in Chart? No - copy requested No - copy requested    No - copy requested No - copy requested    Current Medications (verified) Outpatient Encounter Medications as of 07/13/2024  Medication Sig   ALPRAZolam  (XANAX ) 0.25 MG tablet Take 1 tablet (0.25 mg total) by mouth 3 (three) times daily as needed for anxiety.   anastrozole  (ARIMIDEX ) 1 MG tablet TAKE 1 TABLET BY MOUTH DAILY.   aspirin  EC 81 MG tablet Take 1 tablet (81 mg total) by mouth 2 (two) times daily. To be taken after surgery to prevent blood clots (Patient taking differently: Take 81 mg by mouth 2 (two) times daily. Takes 1 tab daily)   carboxymethylcellulose (REFRESH TEARS) 0.5 % SOLN Place 1 drop into both eyes 3 (three) times daily as needed (dry eyes).   cetirizine (ALLERGY, CETIRIZINE,) 10 MG tablet Take 10 mg by mouth daily.   Continuous Glucose Receiver (DEXCOM G6 RECEIVER) DEVI Use to check blood sugars every morning fasting and 2 hours after largest meal   Continuous Glucose Sensor (DEXCOM G6 SENSOR) MISC Apply new sensor every 10 days.  Use to check blood sugars every morning fasting and 2 hours after largest meal   Continuous Glucose Transmitter (DEXCOM G6 TRANSMITTER) MISC Use to check blood sugars every morning fasting and 2 hours after largest meal   famotidine  (PEPCID ) 20 MG tablet TAKE 1 TABLET BY MOUTH TWICE  DAILY   fluticasone  (FLONASE ) 50 MCG/ACT nasal spray  Place 1 spray into both nostrils daily as needed for allergies or rhinitis.   folic acid  (FOLVITE ) 400 MCG tablet Take 400 mcg by mouth daily.   gabapentin  (NEURONTIN ) 600 MG tablet TAKE 2 TABLETS BY MOUTH 3 TIMES  DAILY   insulin  lispro (HUMALOG  KWIKPEN) 100 UNIT/ML KwikPen Inject 20 Units into the skin 3 (three) times daily. INJECT 20 UNITS 3 TIMES A DAY WITH MEALS   LANTUS  SOLOSTAR 100 UNIT/ML Solostar Pen INJECT 70 UNITS INTO THE SKIN DAILY. NEED OFFICE VISIT FOR  FURTHER REFILLS   losartan  (COZAAR ) 25 MG tablet TAKE 1 TABLET BY MOUTH DAILY   Multiple Vitamin (MULTIVITAMIN) tablet Take 1 tablet by mouth daily.   NIACIN  PO Take 1,000 mg by mouth at bedtime. Take 1 hour after the 81 mg aspirin    PIP PEN NEEDLES 31G X 31G X 5 MM MISC USE AS DIRECTED WITH INSULIN  PENS (4 PER DAY)   No facility-administered encounter medications on file as of 07/13/2024.    Allergies (verified) Doxycycline, Hydromorphone , Metronidazole, Other, and Dilaudid  [hydromorphone  hcl]   History: Past Medical History:  Diagnosis Date   Abnormal EKG    hx of right bundle branch block   Acute pancreatitis    HX OF   Allergy 2008   Not really sure   Anemia yrs ago   Anxiety    Arthritis 2010   Breast cancer (HCC) 2021   Cancer (HCC) dx 2016   B cell lymphoma- Non Hodgkins in remission   Diabetes mellitus (HCC)    TYPE 2   Diabetic neuropathy (HCC)    Diabetic neuropathy (HCC)    Endometrial disorder    GERD (gastroesophageal reflux disease)    Headache    tension or sinus   Hypertension    Malignant lymphoma (HCC)    Morbid obesity (HCC)    Peripheral neuropathy    BOTH FEET, SLIGHT NEUROPATHY IN HANDS   Sleep apnea    USES CPAP at times   Vitamin B 12 deficiency    Past Surgical History:  Procedure Laterality Date   BREAST LUMPECTOMY Right 2021   CHOLECYSTECTOMY  03/06/1996   colomscopy     DILATATION & CURETTAGE/HYSTEROSCOPY WITH MYOSURE N/A 07/04/2018   Procedure: DILATATION & CURETTAGE/HYSTEROSCOPY WITH MYOSURE;  Surgeon: Jannis Kate Norris, MD;  Location: [facility];  Service: Gynecology;  Laterality: N/A;   EYE SURGERY  04/2024   Cataract surgery   MEDIPORT INSERTION, DOUBLE     MEDIPORT REMOVAL     TOTAL KNEE ARTHROPLASTY Right 06/08/2022   Procedure: RIGHT TOTAL KNEE REPLACEMENT;  Surgeon: Jerri Kay HERO, MD;  Location: [facility] OR;  Service: Orthopedics;  Laterality: Right;   Family History  Problem Relation Age of Onset   Colon  cancer Mother 29   Hypertension Mother    Osteoporosis Mother    Arthritis Mother    Cancer Mother    Heart attack Father    Heart disease Sister    Alcohol abuse Maternal Grandfather    Breast cancer Other        MGM's sister; dx 41s   Learning disabilities Son    Social History   Socioeconomic History   Marital status: Married    Spouse name: Carlin Browning   Number of children: 4   Years of education: 12   Highest education level: 12th grade  Occupational History   Occupation: retired from medical billing  Tobacco Use   Smoking status: Former    Current packs/day:  0.00    Average packs/day: 0.5 packs/day for 4.0 years (2.0 ttl pk-yrs)    Types: Cigarettes    Start date: 12/21/1964    Quit date: 12/21/1968    Years since quitting: 55.5   Smokeless tobacco: Never   Tobacco comments:    as teenager  Vaping Use   Vaping status: Never Used  Substance and Sexual Activity   Alcohol use: No   Drug use: No   Sexual activity: Not Currently    Birth control/protection: None  Other Topics Concern   Not on file  Social History Narrative   Lives with husband in a one story home.  Has 3 living children.  One child died in a MVA.  Retired from [employer].     Education: high school.   Social Drivers of Corporate investment banker Strain: Low Risk  (07/13/2024)   Overall Financial Resource Strain (CARDIA)    Difficulty of Paying Living Expenses: Not hard at all  Food Insecurity: No Food Insecurity (07/13/2024)   Hunger Vital Sign    Worried About Running Out of Food in the Last Year: Never true    Ran Out of Food in the Last Year: Never true  Transportation Needs: No Transportation Needs (07/13/2024)   PRAPARE - Administrator, Civil Service (Medical): No    Lack of Transportation (Non-Medical): No  Physical Activity: Insufficiently Active (07/13/2024)   Exercise Vital Sign    Days of Exercise per Week: 2 days    Minutes of Exercise per Session: 30 min   Stress: Stress Concern Present (07/13/2024)   [employer] of Occupational Health - Occupational Stress Questionnaire    Feeling of Stress: To some extent  Social Connections: Moderately Isolated (07/13/2024)   Social Connection and Isolation Panel    Frequency of Communication with Friends and Family: More than three times a week    Frequency of Social Gatherings with Friends and Family: Once a week    Attends Religious Services: Never    Database administrator or Organizations: No    Attends Engineer, structural: Never    Marital Status: Married    Tobacco Counseling Counseling given: Not Answered Tobacco comments: as teenager    Clinical Intake:  Pre-visit preparation completed: Yes  Pain : 0-10 Pain Score: 5  Pain Type: Chronic pain Pain Location: Knee Pain Orientation: Right Pain Descriptors / Indicators: Aching Pain Onset: More than a month ago Pain Frequency: Constant     Nutritional Risks: None Diabetes: Yes CBG done?: No Did pt. bring in CBG monitor from home?: No  Lab Results  Component Value Date   HGBA1C 6.0 (A) 06/26/2024   HGBA1C 6.2 03/24/2024   HGBA1C 5.8 12/09/2023     How often do you need to have someone help you when you read instructions, pamphlets, or other written materials from your doctor or pharmacy?: 1 - Never  Interpreter Needed?: No  Information entered by :: NAllen LPN   Activities of Daily Living     07/10/2024    8:19 PM  In your present state of health, do you have any difficulty performing the following activities:  Hearing? 0  Vision? 0  Difficulty concentrating or making decisions? 0  Walking or climbing stairs? 0  Dressing or bathing? 0  Doing errands, shopping? 0  Preparing Food and eating ? N  Using the Toilet? N  In the past six months, have you accidently leaked urine? N  Do you  have problems with loss of bowel control? N  Managing your Medications? N  Managing your Finances? N  Housekeeping or  managing your Housekeeping? N    Patient Care Team: Chandra Toribio POUR, MD as PCP - General (Family Medicine) Cornelius Wanda DEL, MD as Consulting Physician (Oncology) Rosalie Kitchens, MD as Consulting Physician (Gastroenterology) Tobie Tonita POUR, DO as Consulting Physician (Neurology) Janit Thresa HERO, DPM as Consulting Physician (Podiatry) Care, [facility] (Family Medicine) Burnette Fallow, MD as Consulting Physician (Gastroenterology) Queenie Asberry LABOR, MD as Referring Physician (Gastroenterology) P[facility], [facility]  I have updated your Care Teams any recent Medical Services you may have received from other providers in the past year.     Assessment:   This is a routine wellness examination for Watts.  Hearing/Vision screen Hearing Screening - Comments:: Denies hearing issue Vision Screening - Comments:: Regular eye exams, [facility]   Goals Addressed             This Visit's Progress    Patient Stated       07/13/2024, wants to get answer to knee pain       Depression Screen     07/13/2024   10:51 AM 06/26/2024   10:19 AM 03/24/2024   10:48 AM 12/09/2023   10:58 AM 06/09/2023   10:27 AM 03/09/2023    1:04 PM 10/21/2022   10:04 AM  PHQ 2/9 Scores  PHQ - 2 Score 0 0 0 0 0 0 0  PHQ- 9 Score 3 1 2 2 2  1     Fall Risk     07/10/2024    8:19 PM 06/26/2024   10:18 AM 06/09/2023   10:27 AM 03/09/2023    1:44 PM 03/09/2023   12:01 AM  Fall Risk   Falls in the past year? 0 0 0 0 1  Number falls in past yr: 0 0  0 0  Injury with Fall? 0 0  0 0  Risk for fall due to : Impaired balance/gait No Fall Risks No Fall Risks No Fall Risks   Follow up Falls evaluation completed;Falls prevention discussed Falls evaluation completed Falls evaluation completed      MEDICARE RISK AT HOME:  Medicare Risk at Home Any stairs in or around the home?: (Patient-Rptd) Yes If so, are there any without handrails?: (Patient-Rptd) No Home free of loose throw rugs in  walkways, pet beds, electrical cords, etc?: (Patient-Rptd) No Adequate lighting in your home to reduce risk of falls?: (Patient-Rptd) Yes Life alert?: (Patient-Rptd) No Use of a cane, walker or w/c?: (Patient-Rptd) No Grab bars in the bathroom?: (Patient-Rptd) Yes Shower chair or bench in shower?: (Patient-Rptd) Yes Elevated toilet seat or a handicapped toilet?: (Patient-Rptd) Yes  TIMED UP AND GO:  Was the test performed?  No  Cognitive Function: 6CIT completed        07/13/2024   10:52 AM 03/09/2023    1:05 PM 01/07/2022   10:17 AM 01/15/2021    9:48 AM 04/25/2019    2:30 PM  6CIT Screen  What Year? 0 points 0 points 0 points 0 points 0 points  What month? 0 points 0 points 0 points 0 points 0 points  What time? 0 points 0 points 0 points 0 points 0 points  Count back from 20 0 points 0 points 0 points 0 points 0 points  Months in reverse 0 points 0 points 0 points 0 points 0 points  Repeat phrase 0 points 0 points 0 points  0 points 0 points  Total Score 0 points 0 points 0 points 0 points 0 points    Immunizations Immunization History  Administered Date(s) Administered   Fluad Quad(high Dose 65+) 09/21/2019, 10/24/2020, 10/07/2021, 10/21/2022   Fluad Trivalent(High Dose 65+) 11/05/2023   Influenza, High Dose Seasonal PF 11/04/2018   Influenza,inj,Quad PF,6+ Mos 11/24/2016   Influenza-Unspecified 11/24/2016, 08/31/2017   Moderna Sars-Covid-2 Vaccination 02/03/2020, 03/01/2020   Pneumococcal Conjugate-13 09/21/2015    Screening Tests Health Maintenance  Topic Date Due   Hepatitis C Screening  Never done   DTaP/Tdap/Td (1 - Tdap) Never done   Zoster Vaccines- Shingrix (1 of 2) Never done   COVID-19 Vaccine (3 - Moderna risk series) 03/29/2020   OPHTHALMOLOGY EXAM  03/27/2021   Colonoscopy  11/02/2024   Pneumococcal Vaccine: 50+ Years (2 of 2 - PPSV23, PCV20, or PCV21) 12/08/2024 (Originally 11/16/2015)   INFLUENZA VACCINE  07/21/2024   Diabetic kidney evaluation -  eGFR measurement  09/28/2024   HEMOGLOBIN A1C  12/27/2024   Diabetic kidney evaluation - Urine ACR  06/26/2025   FOOT EXAM  06/26/2025   Medicare Annual Wellness (AWV)  07/13/2025   MAMMOGRAM  05/19/2026   DEXA SCAN  Completed   Hepatitis B Vaccines  Aged Out   HPV VACCINES  Aged Out   Meningococcal B Vaccine  Aged Out    Health Maintenance  Health Maintenance Due  Topic Date Due   Hepatitis C Screening  Never done   DTaP/Tdap/Td (1 - Tdap) Never done   Zoster Vaccines- Shingrix (1 of 2) Never done   COVID-19 Vaccine (3 - Moderna risk series) 03/29/2020   OPHTHALMOLOGY EXAM  03/27/2021   Colonoscopy  11/02/2024   Health Maintenance Items Addressed: Requesting eye exam. Declines covid and shingles vaccines. Due for TDAP vaccine.  Additional Screening:  Vision Screening: Recommended annual ophthalmology exams for early detection of glaucoma and other disorders of the eye. Would you like a referral to an eye doctor? No    Dental Screening: Recommended annual dental exams for proper oral hygiene  Community Resource Referral / Chronic Care Management: CRR required this visit?  No   CCM required this visit?  No   Plan:    I have personally reviewed and noted the following in the patient's chart:   Medical and social history Use of alcohol, tobacco or illicit drugs  Current medications and supplements including opioid prescriptions. Patient is not currently taking opioid prescriptions. Functional ability and status Nutritional status Physical activity Advanced directives List of other physicians Hospitalizations, surgeries, and ER visits in previous 12 months Vitals Screenings to include cognitive, depression, and falls Referrals and appointments  In addition, I have reviewed and discussed with patient certain preventive protocols, quality metrics, and best practice recommendations. A written personalized care plan for preventive services as well as general  preventive health recommendations were provided to patient.   Ardella FORBES Dawn, LPN   [id number]   After Visit Summary: (MyChart) Due to this being a telephonic visit, the after visit summary with patients personalized plan was offered to patient via MyChart   Notes: Nothing significant to report at this time.

## 2024-07-13 NOTE — Patient Instructions (Signed)
 Jasmine Davila , Thank you for taking time out of your busy schedule to complete your Annual Wellness Visit with me. I enjoyed our conversation and look forward to speaking with you again next year. I, as well as your care team,  appreciate your ongoing commitment to your health goals. Please review the following plan we discussed and let me know if I can assist you in the future. Your Game plan/ To Do List    Referrals: If you haven't heard from the office you've been referred to, please reach out to them at the phone provided.  N/a Follow up Visits: Next Medicare AWV with our clinical staff: 09/20/2025 at 10:50   Have you seen your provider in the last 6 months (3 months if uncontrolled diabetes)? Yes Next Office Visit with your provider: 10/04/2024 at 10:10  Clinician Recommendations:  Aim for 30 minutes of exercise or brisk walking, 6-8 glasses of water, and 5 servings of fruits and vegetables each day.       This is a list of the screening recommended for you and due dates:  Health Maintenance  Topic Date Due   Hepatitis C Screening  Never done   DTaP/Tdap/Td vaccine (1 - Tdap) Never done   Zoster (Shingles) Vaccine (1 of 2) Never done   COVID-19 Vaccine (3 - Moderna risk series) 03/29/2020   Eye exam for diabetics  03/27/2021   Colon Cancer Screening  11/02/2024   Pneumococcal Vaccine for age over 60 (2 of 2 - PPSV23, PCV20, or PCV21) 12/08/2024*   Flu Shot  07/21/2024   Yearly kidney function blood test for diabetes  09/28/2024   Hemoglobin A1C  12/27/2024   Yearly kidney health urinalysis for diabetes  06/26/2025   Complete foot exam   06/26/2025   Medicare Annual Wellness Visit  07/13/2025   Mammogram  05/19/2026   DEXA scan (bone density measurement)  Completed   Hepatitis B Vaccine  Aged Out   HPV Vaccine  Aged Out   Meningitis B Vaccine  Aged Out  *Topic was postponed. The date shown is not the original due date.    Advanced directives: (Copy Requested) Please bring a  copy of your health care power of attorney and living will to the office to be added to your chart at your convenience. You can mail to [facility] [address] [place], [place] [address] or email to [email or web address] Advance Care Planning is important because it:  [x]  Makes sure you receive the medical care that is consistent with your values, goals, and preferences  [x]  It provides guidance to your family and loved ones and reduces their decisional burden about whether or not they are making the right decisions based on your wishes.  Follow the link provided in your after visit summary or read over the paperwork we have mailed to you to help you started getting your Advance Directives in place. If you need assistance in completing these, please reach out to us  so that we can help you!  See attachments for Preventive Care and Fall Prevention Tips.

## 2024-07-17 ENCOUNTER — Ambulatory Visit: Payer: Self-pay | Admitting: *Deleted

## 2024-07-19 ENCOUNTER — Encounter: Payer: Self-pay | Admitting: Family Medicine

## 2024-07-24 NOTE — Telephone Encounter (Signed)
 Contacted pt and informed her that the cbc had not been drawn so we scheduled her to come in on Friday 8/8

## 2024-07-26 ENCOUNTER — Other Ambulatory Visit: Payer: Self-pay | Admitting: *Deleted

## 2024-07-26 DIAGNOSIS — G8929 Other chronic pain: Secondary | ICD-10-CM

## 2024-07-28 ENCOUNTER — Other Ambulatory Visit: Payer: Self-pay | Admitting: Family Medicine

## 2024-07-28 ENCOUNTER — Other Ambulatory Visit

## 2024-07-28 DIAGNOSIS — Z96651 Presence of right artificial knee joint: Secondary | ICD-10-CM | POA: Diagnosis not present

## 2024-07-28 DIAGNOSIS — E0821 Diabetes mellitus due to underlying condition with diabetic nephropathy: Secondary | ICD-10-CM

## 2024-07-28 DIAGNOSIS — G8929 Other chronic pain: Secondary | ICD-10-CM

## 2024-07-28 DIAGNOSIS — M25561 Pain in right knee: Secondary | ICD-10-CM | POA: Diagnosis not present

## 2024-07-28 MED ORDER — INSULIN LISPRO (1 UNIT DIAL) 100 UNIT/ML (KWIKPEN): 20.0000 [IU] | PEN_INJECTOR | Freq: Three times a day (TID) | SUBCUTANEOUS | 1 refills | Status: AC

## 2024-07-29 LAB — CBC WITH DIFFERENTIAL/PLATELET
Basophils Absolute: 0 x10E3/uL (ref 0.0–0.2)
Basos: 0 %
EOS (ABSOLUTE): 0.2 x10E3/uL (ref 0.0–0.4)
Eos: 4 %
Hematocrit: 39.5 % (ref 34.0–46.6)
Hemoglobin: 12.8 g/dL (ref 11.1–15.9)
Immature Grans (Abs): 0 x10E3/uL (ref 0.0–0.1)
Immature Granulocytes: 0 %
Lymphocytes Absolute: 1.3 x10E3/uL (ref 0.7–3.1)
Lymphs: 25 %
MCH: 29.4 pg (ref 26.6–33.0)
MCHC: 32.4 g/dL (ref 31.5–35.7)
MCV: 91 fL (ref 79–97)
Monocytes Absolute: 0.3 x10E3/uL (ref 0.1–0.9)
Monocytes: 5 %
Neutrophils Absolute: 3.4 x10E3/uL (ref 1.4–7.0)
Neutrophils: 66 %
Platelets: 167 x10E3/uL (ref 150–450)
RBC: 4.35 x10E6/uL (ref 3.77–5.28)
RDW: 12.5 % (ref 11.7–15.4)
WBC: 5.2 x10E3/uL (ref 3.4–10.8)

## 2024-07-31 ENCOUNTER — Ambulatory Visit
Admission: RE | Admit: 2024-07-31 | Discharge: 2024-07-31 | Disposition: A | Discharge status: Home / Self Care | Source: Ambulatory Visit | Attending: Orthopedic Surgery | Admitting: Orthopedic Surgery

## 2024-07-31 DIAGNOSIS — Z96651 Presence of right artificial knee joint: Secondary | ICD-10-CM | POA: Diagnosis not present

## 2024-07-31 MED ORDER — TECHNETIUM TC 99M MEDRONATE IV KIT
20.0000 | PACK | Freq: Once | INTRAVENOUS | Status: AC | PRN
  Administered 2024-07-31 (×2): 21.72 via INTRAVENOUS

## 2024-08-01 ENCOUNTER — Ambulatory Visit: Payer: Self-pay | Admitting: Family Medicine

## 2024-08-09 ENCOUNTER — Other Ambulatory Visit: Payer: Self-pay | Admitting: Family Medicine

## 2024-08-09 DIAGNOSIS — E1159 Type 2 diabetes mellitus with other circulatory complications: Secondary | ICD-10-CM

## 2024-08-31 ENCOUNTER — Encounter: Payer: Self-pay | Admitting: Family Medicine

## 2024-09-06 ENCOUNTER — Other Ambulatory Visit: Payer: Self-pay | Admitting: Family Medicine

## 2024-09-06 DIAGNOSIS — Z794 Long term (current) use of insulin: Secondary | ICD-10-CM

## 2024-09-06 MED ORDER — DEXCOM G6 TRANSMITTER MISC
3 refills | Status: AC
Start: 2024-09-06 — End: ?

## 2024-09-06 MED ORDER — DEXCOM G6 SENSOR MISC
3 refills | Status: AC
Start: 2024-09-06 — End: ?

## 2024-09-08 DIAGNOSIS — E1169 Type 2 diabetes mellitus with other specified complication: Secondary | ICD-10-CM | POA: Diagnosis not present

## 2024-09-12 ENCOUNTER — Other Ambulatory Visit: Payer: Self-pay | Admitting: Podiatry

## 2024-09-13 NOTE — Progress Notes (Signed)
 Jasmine Davila                                          MRN: [id number]   09/13/2024   The VBCI Quality Team Specialist reviewed this patient medical record for the purposes of chart review for care gap closure. The following were reviewed: chart review for care gap closure-kidney health evaluation for diabetes:eGFR  and uACR.    VBCI Quality Team

## 2024-09-27 ENCOUNTER — Other Ambulatory Visit

## 2024-09-27 DIAGNOSIS — E785 Hyperlipidemia, unspecified: Secondary | ICD-10-CM | POA: Diagnosis not present

## 2024-09-27 DIAGNOSIS — E1169 Type 2 diabetes mellitus with other specified complication: Secondary | ICD-10-CM | POA: Diagnosis not present

## 2024-09-27 DIAGNOSIS — Z794 Long term (current) use of insulin: Secondary | ICD-10-CM | POA: Diagnosis not present

## 2024-09-27 DIAGNOSIS — I152 Hypertension secondary to endocrine disorders: Secondary | ICD-10-CM

## 2024-09-27 DIAGNOSIS — E0821 Diabetes mellitus due to underlying condition with diabetic nephropathy: Secondary | ICD-10-CM | POA: Diagnosis not present

## 2024-09-27 DIAGNOSIS — E1159 Type 2 diabetes mellitus with other circulatory complications: Secondary | ICD-10-CM | POA: Diagnosis not present

## 2024-09-28 ENCOUNTER — Ambulatory Visit: Payer: Self-pay

## 2024-09-28 LAB — TSH: TSH: 1.73 u[IU]/mL (ref 0.450–4.500)

## 2024-09-28 LAB — COMPREHENSIVE METABOLIC PANEL WITH GFR
ALT: 23 IU/L (ref 0–32)
AST: 30 IU/L (ref 0–40)
Albumin: 4.4 g/dL (ref 3.8–4.8)
Alkaline Phosphatase: 102 IU/L (ref 49–135)
BUN/Creatinine Ratio: 16 (ref 12–28)
BUN: 12 mg/dL (ref 8–27)
Bilirubin Total: 0.6 mg/dL (ref 0.0–1.2)
CO2: 23 mmol/L (ref 20–29)
Calcium: 10.1 mg/dL (ref 8.7–10.3)
Chloride: 95 mmol/L — ABNORMAL LOW (ref 96–106)
Creatinine, Ser: 0.74 mg/dL (ref 0.57–1.00)
Globulin, Total: 2.6 g/dL (ref 1.5–4.5)
Glucose: 153 mg/dL — ABNORMAL HIGH (ref 70–99)
Potassium: 4.8 mmol/L (ref 3.5–5.2)
Sodium: 137 mmol/L (ref 134–144)
Total Protein: 7 g/dL (ref 6.0–8.5)
eGFR: 85 mL/min/1.73 (ref >59)

## 2024-09-28 LAB — HEMOGLOBIN A1C
Est. average glucose Bld gHb Est-mCnc: 131 mg/dL
Hgb A1c MFr Bld: 6.2 % — ABNORMAL HIGH (ref 4.8–5.6)

## 2024-09-28 LAB — CBC WITH DIFFERENTIAL/PLATELET
Basophils Absolute: 0 x10E3/uL (ref 0.0–0.2)
Basos: 1 %
EOS (ABSOLUTE): 0.1 x10E3/uL (ref 0.0–0.4)
Eos: 3 %
Hematocrit: 37.6 % (ref 34.0–46.6)
Hemoglobin: 12.3 g/dL (ref 11.1–15.9)
Immature Grans (Abs): 0 x10E3/uL (ref 0.0–0.1)
Immature Granulocytes: 0 %
Lymphocytes Absolute: 1.5 x10E3/uL (ref 0.7–3.1)
Lymphs: 31 %
MCH: 30 pg (ref 26.6–33.0)
MCHC: 32.7 g/dL (ref 31.5–35.7)
MCV: 92 fL (ref 79–97)
Monocytes Absolute: 0.4 x10E3/uL (ref 0.1–0.9)
Monocytes: 9 %
Neutrophils Absolute: 2.8 x10E3/uL (ref 1.4–7.0)
Neutrophils: 56 %
Platelets: 158 x10E3/uL (ref 150–450)
RBC: 4.1 x10E6/uL (ref 3.77–5.28)
RDW: 12.7 % (ref 11.7–15.4)
WBC: 4.9 x10E3/uL (ref 3.4–10.8)

## 2024-09-28 LAB — LIPID PANEL
Chol/HDL Ratio: 2.3 ratio (ref 0.0–4.4)
Cholesterol, Total: 142 mg/dL (ref 100–199)
HDL: 63 mg/dL (ref >39)
LDL Chol Calc (NIH): 65 mg/dL (ref 0–99)
Triglycerides: 73 mg/dL (ref 0–149)
VLDL Cholesterol Cal: 14 mg/dL (ref 5–40)

## 2024-10-01 ENCOUNTER — Other Ambulatory Visit: Payer: Self-pay | Admitting: Family Medicine

## 2024-10-01 DIAGNOSIS — K219 Gastro-esophageal reflux disease without esophagitis: Secondary | ICD-10-CM

## 2024-10-04 ENCOUNTER — Ambulatory Visit (INDEPENDENT_AMBULATORY_CARE_PROVIDER_SITE_OTHER): Admitting: Family Medicine

## 2024-10-04 ENCOUNTER — Encounter: Payer: Self-pay | Admitting: Family Medicine

## 2024-10-04 VITALS — BP 138/82 | HR 77 | Ht 67.0 in | Wt 235.8 lb

## 2024-10-04 DIAGNOSIS — Z170 Estrogen receptor positive status [ER+]: Secondary | ICD-10-CM

## 2024-10-04 DIAGNOSIS — E1142 Type 2 diabetes mellitus with diabetic polyneuropathy: Secondary | ICD-10-CM | POA: Diagnosis not present

## 2024-10-04 DIAGNOSIS — M1711 Unilateral primary osteoarthritis, right knee: Secondary | ICD-10-CM

## 2024-10-04 DIAGNOSIS — C50511 Malignant neoplasm of lower-outer quadrant of right female breast: Secondary | ICD-10-CM | POA: Diagnosis not present

## 2024-10-04 DIAGNOSIS — Z794 Long term (current) use of insulin: Secondary | ICD-10-CM

## 2024-10-04 DIAGNOSIS — Z0000 Encounter for general adult medical examination without abnormal findings: Secondary | ICD-10-CM | POA: Diagnosis not present

## 2024-10-04 MED ORDER — PIP PEN NEEDLES 31G X 5MM 31G X 5 MM MISC: 11 refills | Status: AC

## 2024-10-04 MED ORDER — FLUTICASONE PROPIONATE 50 MCG/ACT NA SUSP: 1.0000 | Freq: Every day | NASAL | 5 refills | Status: AC | PRN

## 2024-10-04 NOTE — Progress Notes (Unsigned)
 Annual physical  Subjective    Patient ID: Jasmine Davila, female    DOB: 16-Dec-1951  Age: 73 y.o. MRN: [id number]  Chief Complaint  Patient presents with  . Annual Exam   HPI Jasmine Davila is a 73 y.o. old female here  for annual exam.   Subjective - Here for physical. Reports ongoing tiredness. - Persistent right knee pain post-TKA. Described as a constant dull ache on the inner aspect of theknee. Second opinion showed no abnormalities. Reports some improvement with Voltaren gel and wearing a knee brace. No burning, sharp pain, or numbness. Pain does not radiate. - Diabetic neuropathy affecting feet, hands, and legs up to mid-shin. - Anxiety, depends on family-related stress. Sometimes uses medication to aid sleep. - Reports issues with Dexcom G6 signal loss.  Medications Xanax  as needed for anxiety, taken approximately every other day, Arimidex  (anastrozole ) 1mg  daily for 5 years, Aspirin  81mg  daily, Claritin/Zyrtec as needed, Dexcom G6, Pepcid  twice daily, Flonase  nasal spray daily during allergy season, Folic acid  daily, Gabapentin  for neuropathy, short-acting insulin  (Novolog /Humalog ) sliding scale (8-20 units) based on blood sugar, Lantus  65 units daily, Losartan  25mg  daily, Multivitamin daily, Niacin  for cholesterol. Denies any changes since last visit.  PMH, PSH, FH, Social Hx PMHx: Arthritis, Anxiety, Breast Cancer (on Arimidex ), Diabetes Mellitus Type 2, Hypertension, Hypercholesterolemia, history of chronic pancreatitis. PSHx: Total knee arthroplasty (right).  ROS General: Reports fatigue. MSK: Reports right knee pain. Denies radiation. Neuro: Reports neuropathy in hands and feet extending up the legs. Denies numbness in the knee. Psych: Reports anxiety.  Objective Vitals: BP 167/x upon arrival. CV: RRR, no m/r/g. PULM: Lungs clear to auscultation bilaterally. NEURO: EOMI.  Assessment and Plan 1. Health Maintenance - Annual physical exam. Labs from last  visit reviewed: normal thyroid , hemoglobin, kidney function, liver function. A1c 6.2. Cholesterol normal. - Saw retinalist, Dr. Tobie, in March 2025 for diabetic eye exam. - Due for screening colonoscopy (last in 2020, recommended 5-year interval). Has number for [facility] and will schedule. - Declined shingles and tetanus vaccines. Discussed benefits of Tdap. - Will receive flu shot in office today. - Will need needles and Flonase  prescription. - - Plan: - Administer flu shot. - Send prescription for needles and Flonase . - Continue current medications as discussed. - Follow up on scheduling colonoscopy.  2. Right Knee Pain - Persistent dull ache on the medial aspect of the right knee, status post total knee replacement. Second opinion with Dr. Leora revealed no acute abnormalities on imaging. - Current management includes topical Voltaren gel and knee brace with mild relief. - - Plan: - Continue Voltaren gel and knee brace as needed. - Suggested trying TENS unit.  3. Diabetic Neuropathy - Neuropathy affecting bilateral feet and hands, extending up the legs. Currently taking Gabapentin  with ongoing symptoms. - Discussed Qutenza patch as a potential next step, which requires prior authorization. Also discussed non-prescription vibrating plates. - - Plan: - Will send a referral to [facility] for a Qutenza patch trial.  4. Diabetes Mellitus, Type 2 - A1c at goal (6.2). Reports difficulty with glucose regulation and issues with Dexcom G6 accuracy, confirmed by fingersticks. - Continues on Lantus  65 units and sliding scale short-acting insulin . - Discussed switching to Dexcom G7 for better reliability. - Patient has a history of chronic pancreatitis, making GLP-1 agonists (e.g., Ozempic, Mounjaro) contraindicated. - - Plan: - Send prescription for Dexcom G7. - Continue current insulin  regimen and self-adjustment.    The 10-year ASCVD risk score (Arnett  DK, et al., 2019)  is: 42.2%  Health Maintenance Due  Topic Date Due  . Hepatitis C Screening  Never done  . DTaP/Tdap/Td (1 - Tdap) Never done  . Zoster Vaccines- Shingrix (1 of 2) Never done  . COVID-19 Vaccine (3 - Moderna risk series) 03/29/2020  . OPHTHALMOLOGY EXAM  03/27/2021  . Influenza Vaccine  07/21/2024  . Colonoscopy  11/02/2024      Objective:     BP (!) 162/80   Pulse 77   Ht 5' 7 (1.702 m)   Wt 235 lb 12.8 oz (107 kg)   LMP 12/21/1998 (Approximate)   SpO2 99%   BMI 36.93 kg/m  {Vitals History (Optional):23777}  Physical Exam   No results found for any visits on 10/04/24.      Assessment & Plan:   There are no diagnoses linked to this encounter.   No follow-ups on file.    Toribio MARLA Slain, MD

## 2024-10-04 NOTE — Patient Instructions (Signed)
 It was nice to see you today,  We addressed the following topics today: - I am sending in a prescription for the Dexcom G7 as we discussed. The pharmacy will hold it until your current prescription runs out. - I am also sending a referral to [facility] for the Qutenza patch for your neuropathy. Please let my office know if they approve it. They may also be able to help with your knee pain. - Please schedule your colonoscopy with [facility]. - You can try your husband's TENS unit on your knee to see if it provides any relief. - I have also sent prescriptions for needles and Flonase  to your pharmacy.  Have a great day,  Rolan Slain, MD

## 2024-10-05 NOTE — Progress Notes (Signed)
 [facility]  [address] [place],  [place]  [address] [phone]   Clinic Day: 10/06/24  Referring physician: Chandra Toribio POUR, MD  CHIEF COMPLAINT:  CC:  Stage IA hormone receptor positive breast cancer  Current Treatment:   Adjuvant hormonal therapy  HISTORY OF PRESENT ILLNESS:  Jasmine Davila is a 73 y.o. 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 Annual screening mammogram revealed an area of calcifications with in the right breast working further evaluation.  Diagnostic unilateral right mammogram in May confirmed an area of pleomorphic calcifications spanning 1.1 cm in the lower inner quadrant of the right breast.  Stereotactic biopsy revealed intermediate grade, ductal carcinoma in situ with necrosis.  Fibrocystic changes including florid and micropapillary usual ductal hyperplasia and micropapillary apocrine metaplasia were also seen.  No invasive carcinoma was identified.  Estrogen receptor was positive at 90% and progesterone receptor was positive at 95%.  She underwent lumpectomy in June.  Surgical pathology confirmed a residual 1 mm  ductal carcinoma in situ, intermediate grade.  No invasive carcinoma identified and margins were free of neoplasm. We recommended chemoprevention with raloxifene, however, she declined.  She also has a history of stage IV diffuse large B-cell lymphoma diagnosed in May 2016, treated with 6 cycles of R-CHOP with a complete response.  She has remained without evidence of recurrence. She undergoes screening colonoscopy every 5 years due to a family history of colon cancer. He last colonoscopy was  in November 2020. Bone density scan in June 2020 was normal.  She has had pancreatitis on and off since 2008.  Annual mammography from May 2022 was clear. CT imaging of the chest, abdomen and pelvis from August 2022 revealed no evidence of metastatic breast cancer or  recurrence of lymphoma.   New stage IA (T1c N0 M0) invasive ductal carcinoma of the right breast diagnosed in June 2024.  There was a possible mass in the right breast on screening mammogram in June. Right diagnostic right mammogram and ultrasound in June revealed a 1.2 cm mass in the right lower quadrant. The right axilla was negative on ultrasound. Biopsy revealed grade 2, invasive ductal carcinoma. Estrogen and progesterone receptors were positive and HER2 negative. Ki67 was 30%. She was treated with in July. Pathology revealed 1.5 cm, grade 2,  invasive ductal carcinoma with ductal carcinoma in situ. Margins were negative. EndoPredict in August revealed a low risk Epclin score of 2.7 which corresponds with a 5.6% for recurrence in 0-10 years and a 1.3% for an estimated absolute chemotherapy benefit at 10 years, as well as a  likelihood of late distant recurrence in 5-15 years is 4.4%.   INTERVAL HISTORY:  Jasmine Davila is here for follow-up of her stage IA hormone receptor positive invasive ductal carcinoma of the right breast diagnosed in June, 2024. She completed radiation to the right breast in October.  She also had a ductal carcinoma in situ diagnosed in 2021 and has a history of diffuse large B-cell lymphoma in 2016. She had genetic testing and this did not reveal any clinically significant mutation or variants of uncertain significance. She was started on hormonal therapy with anastrozole  in November 2024. Patient states that she feels well, but states that she has right knee pain. She completed her bone scan on 07/31/2024 which revealed low level delayed uptake surrounding the components of the right knee arthroplasty, nonspecific but likely postsurgical and no scintigraphic evidence  of an acute or destructive process. Her last bilateral diagnostic mammogram was completed on 05/19/2024 and revealed interval postlumpectomy changes in the upper-outer right breast and no mammographic evidence of malignancy  in bilateral breasts. Her CBC on 09/27/2024 showed a WBC of 4.9, hemoglobin of 12.3, and a platelet count of 158,000. Her CMP was normal other than an elevated blood glucose of 153. Her hemoglobin A1c was mildly elevated at 6.2, up from 6.0. Her lipid panel was completely normal. Her TSH was 1.73. She would like a prescription refill for 0.25 mg Xanax , which she uses 2 times a week, so I will do this for her. She will complete her next colonoscopy next year and usually does every 5 years due to family history. She continues to visit Dr. Flynn for regular labs. I will schedule her for an updated diagnostic mammogram in June (plan at [facility]) and I will see her back in 4 months for physical exam.  She denies fever, chills, night sweats, or other signs of infection. She denies cardiorespiratory and gastrointestinal issues. She  denies pain. Her appetite is good and Her weight has decreased 3 pounds over last 3 months.  REVIEW OF SYSTEMS:  Review of Systems  Constitutional:  Negative for appetite change, chills, diaphoresis, fatigue, fever and unexpected weight change.  HENT:  Negative.  Negative for lump/mass, mouth sores, nosebleeds, sore throat and trouble swallowing.   Eyes: Negative.  Negative for eye problems.  Respiratory: Negative.  Negative for chest tightness, cough, hemoptysis, shortness of breath and wheezing.   Cardiovascular: Negative.  Negative for chest pain, leg swelling and palpitations.  Gastrointestinal: Negative.  Negative for abdominal distention, abdominal pain, blood in stool, constipation, diarrhea, nausea and vomiting.  Endocrine: Positive for hot flashes.  Genitourinary: Negative.  Negative for difficulty urinating, dysuria, frequency and hematuria.   Musculoskeletal:  Positive for arthralgias (right knee pain). Negative for back pain, flank pain, gait problem and myalgias.  Skin: Negative.  Negative for itching and rash.  Neurological:  Positive for numbness (neuropathy,  pain, of the feet). Negative for dizziness, extremity weakness, gait problem, headaches, light-headedness, seizures and speech difficulty.  Hematological: Negative.  Negative for adenopathy. Does not bruise/bleed easily.  Psychiatric/Behavioral:  Positive for sleep disturbance. Negative for depression. The patient is not nervous/anxious.     VITALS:  Blood pressure 133/70, pulse 76, temperature 98.1 F (36.7 C), temperature source Oral, resp. rate 16, height 5' 7 (1.702 m), weight 234 lb 4.8 oz (106.3 kg), last menstrual period 12/21/1998, SpO2 98%.  Wt Readings from Last 3 Encounters:  10/06/24 234 lb 4.8 oz (106.3 kg)  10/04/24 235 lb 12.8 oz (107 kg)  06/26/24 237 lb 4 oz (107.6 kg)    Body mass index is 36.7 kg/m.  Performance status (ECOG): 1 - Symptomatic but completely ambulatory  PHYSICAL EXAM:   Physical Exam Vitals and nursing note reviewed.  Constitutional:      General: She is not in acute distress.    Appearance: Normal appearance. She is normal weight. She is not ill-appearing, toxic-appearing or diaphoretic.  HENT:     Head: Normocephalic and atraumatic.     Right Ear: Tympanic membrane, ear canal and external ear normal. There is no impacted cerumen.     Left Ear: Tympanic membrane, ear canal and external ear normal.     Nose: Nose normal. No congestion or rhinorrhea.     Mouth/Throat:     Mouth: Mucous membranes are moist.     Pharynx: Oropharynx is  clear. No oropharyngeal exudate or posterior oropharyngeal erythema.  Eyes:     General: No scleral icterus.       Right eye: No discharge.        Left eye: No discharge.     Extraocular Movements: Extraocular movements intact.     Conjunctiva/sclera: Conjunctivae normal.     Pupils: Pupils are equal, round, and reactive to light.  Cardiovascular:     Rate and Rhythm: Normal rate and regular rhythm.     Pulses: Normal pulses.     Heart sounds: Normal heart sounds. No murmur heard.    No friction rub. No gallop.   Pulmonary:     Effort: Pulmonary effort is normal. No respiratory distress.     Breath sounds: Normal breath sounds. No stridor. No wheezing, rhonchi or rales.  Chest:     Chest wall: No tenderness.  Breasts:    Right: Normal. No inverted nipple, mass, nipple discharge or skin change.     Left: Normal. No inverted nipple, mass, nipple discharge or skin change.     Comments: No masses in either breasts Abdominal:     General: Bowel sounds are normal. There is no distension.     Palpations: Abdomen is soft. There is no hepatomegaly, splenomegaly or mass.     Tenderness: There is no abdominal tenderness. There is no right CVA tenderness, left CVA tenderness, guarding or rebound.     Hernia: No hernia is present.  Musculoskeletal:        General: No swelling. Normal range of motion.     Cervical back: Normal range of motion and neck supple. No rigidity.     Right lower leg: No edema.     Left lower leg: No edema.  Lymphadenopathy:     Cervical: No cervical adenopathy.     Right cervical: No superficial, deep or posterior cervical adenopathy.    Left cervical: No superficial, deep or posterior cervical adenopathy.     Upper Body:     Right upper body: No supraclavicular, axillary or pectoral adenopathy.     Left upper body: No supraclavicular, axillary or pectoral adenopathy.  Skin:    General: Skin is warm and dry.     Coloration: Skin is not jaundiced.     Findings: No rash.  Neurological:     General: No focal deficit present.     Mental Status: She is alert and oriented to person, place, and time. Mental status is at baseline.  Psychiatric:        Mood and Affect: Mood normal.        Behavior: Behavior normal.        Thought Content: Thought content normal.        Judgment: Judgment normal.    LABS:      Latest Ref Rng & Units 09/27/2024    9:45 AM 07/28/2024    9:41 AM 02/10/2023   11:07 AM  CBC  WBC 3.4 - 10.8 x10E3/uL 4.9  5.2  4.9   Hemoglobin 11.1 - 15.9 g/dL 87.6   87.1  86.4   Hematocrit 34.0 - 46.6 % 37.6  39.5  40.6   Platelets 150 - 450 x10E3/uL 158  167  153       Latest Ref Rng & Units 09/27/2024    9:45 AM 09/29/2023   12:00 AM 06/09/2023   11:14 AM  CMP  Glucose 70 - 99 mg/dL 846   870   BUN 8 - 27 mg/dL 12  13     18   Creatinine 0.57 - 1.00 mg/dL 9.25  0.6     9.23   Sodium 134 - 144 mmol/L 137  134     139   Potassium 3.5 - 5.2 mmol/L 4.8  4.2     4.8   Chloride 96 - 106 mmol/L 95  99     100   CO2 20 - 29 mmol/L 23  32     25   Calcium 8.7 - 10.3 mg/dL 89.8  9.8     89.8   Total Protein 6.0 - 8.5 g/dL 7.0     Total Bilirubin 0.0 - 1.2 mg/dL 0.6     Alkaline Phos 49 - 135 IU/L 102  85       AST 0 - 40 IU/L 30  42       ALT 0 - 32 IU/L 23  23          This result is from an external source.    Lab Results  Component Value Date   ALBUMINELP 4.1 08/30/2018   A1GS 0.3 08/30/2018   A2GS 0.8 08/30/2018   BETS 0.5 08/30/2018   BETA2SER 0.4 08/30/2018   GAMS 1.5 08/30/2018   SPEI  08/30/2018     Comment:     Normal Serum Protein Electrophoresis Pattern. No abnormal protein bands (M-protein) detected.     Lab Results  Component Value Date   LDH 175 02/10/2023   LDH 174 08/10/2022    STUDIES:  EXAM: 07/31/2024 NM BONE SCAN 3 PHASE IMPRESSION: 1. Low level delayed uptake surrounding the components of the right knee arthroplasty, nonspecific but likely postsurgical. 2. No scintigraphic evidence of an acute or destructive process.  Diagnostic Mammogram 05/19/24 FINDINGS: RIGHT:   Interval parenchymal redistribution with density and architectural distortion in the upper outer right breast middle depth, consistent with post lumpectomy changes. No suspicious density or calcifications are visualized on the spot magnification view of the lumpectomy bed.   Stable post lumpectomy changes in the inner right breast. No new suspicious mass, calcification, or other findings are identified in the right breast.   LEFT:   No  new suspicious mass, calcification, or other findings are identified in the left breast.   IMPRESSION: 1. Interval postlumpectomy changes in the upper-outer RIGHT breast. 2. No mammographic evidence of malignancy in bilateral breasts.   RECOMMENDATION: BILATERAL diagnostic mammogram in 12 months to continue with post lumpectomy surveillance protocol.    EXAM: 02/15/2024 DUAL X-RAY ABSORPTIOMETRY (DXA) FOR BONE MINERAL DENSITY AP Spine L1-L4 (L3) 02/15/2024 73.1 Normal 0.8 1.285 g/cm2 - - DualFemur Neck Left 02/15/2024 73.1 Normal -0.7 0.938 g/cm2 - -  HISTORY:   Allergies:  Allergies  Allergen Reactions   Doxycycline Other (See Comments)    Pancreatic issues   Hydromorphone  Other (See Comments)    hydromorphone    Metronidazole Other (See Comments)    Cream  Burned face   Other Other (See Comments)   Dilaudid  [Hydromorphone  Hcl] Anxiety    Current Medications: Current Outpatient Medications  Medication Sig Dispense Refill   ALPRAZolam  (XANAX ) 0.25 MG tablet Take 1 tablet (0.25 mg total) by mouth 3 (three) times daily as needed for anxiety. 60 tablet 1   anastrozole  (ARIMIDEX ) 1 MG tablet TAKE 1 TABLET BY MOUTH DAILY. 90 tablet 2   aspirin  EC 81 MG tablet Take 1 tablet (81 mg total) by mouth 2 (two) times daily. To be taken after surgery to prevent blood clots (Patient  taking differently: Take 81 mg by mouth 2 (two) times daily. Takes 1 tab daily) 84 tablet 0   carboxymethylcellulose (REFRESH TEARS) 0.5 % SOLN Place 1 drop into both eyes 3 (three) times daily as needed (dry eyes).     cetirizine (ALLERGY, CETIRIZINE,) 10 MG tablet Take 10 mg by mouth daily.     Continuous Glucose Receiver (DEXCOM G6 RECEIVER) DEVI Use to check blood sugars every morning fasting and 2 hours after largest meal 1 each 0   Continuous Glucose Sensor (DEXCOM G7 SENSOR) MISC Apply to back of arm every 10 days 3 each 5   Continuous Glucose Transmitter (DEXCOM G6 TRANSMITTER) MISC Use to check blood  sugars every morning fasting and 2 hours after largest meal 1 each 3   famotidine  (PEPCID ) 20 MG tablet TAKE 1 TABLET BY MOUTH TWICE  DAILY 200 tablet 2   fluticasone  (FLONASE ) 50 MCG/ACT nasal spray Place 1 spray into both nostrils daily as needed for allergies or rhinitis. 16 g 5   folic acid  (FOLVITE ) 400 MCG tablet Take 400 mcg by mouth daily.     gabapentin  (NEURONTIN ) 600 MG tablet TAKE 2 TABLETS BY MOUTH 3 TIMES  DAILY 600 tablet 2   insulin  lispro (HUMALOG  KWIKPEN) 100 UNIT/ML KwikPen Inject 20 Units into the skin 3 (three) times daily. INJECT 20 UNITS 3 TIMES A DAY WITH MEALS 54 mL 1   LANTUS  SOLOSTAR 100 UNIT/ML Solostar Pen INJECT 70 UNITS INTO THE SKIN DAILY. NEED OFFICE VISIT FOR FURTHER REFILLS 60 mL 3   losartan  (COZAAR ) 25 MG tablet TAKE 1 TABLET BY MOUTH DAILY 100 tablet 2   Multiple Vitamin (MULTIVITAMIN) tablet Take 1 tablet by mouth daily.     NIACIN  PO Take 1,000 mg by mouth at bedtime. Take 1 hour after the 81 mg aspirin      PIP PEN NEEDLES 31G X 31G X 5 MM MISC USE AS DIRECTED WITH INSULIN  PENS (4 PER DAY) 100 each 11   No current facility-administered medications for this visit.   ASSESSMENT & PLAN:  Assessment: 1. Stage IA hormone receptor positive right breast invasive carcinoma.  EndoPredict in August revealed a low risk EpClin score of 2.7 for a 5.6% risk for recurrence in 0-10 years and 1.3% for an estimated absolute chemotherapy benefit at 10 years. Her likelihood of late distant recurrence in 5-15 years is 4.4%. She has completed radiation therapy in October, 2024. Bone density scan in 2024 is normal. She was placed her on anastrozole  1 mg daily in November, 2024.   2.  History of stage 0 ductal carcinoma in situ diagnosed in April, 2021, treated with lumpectomy. We offered chemoprevention with raloxifene  but she declined.  3.  History of stage IV diffuse large B-cell lymphoma diagnosed in May 2016, treated with 6 cycles of R-CHOP chemotherapy with a complete  response.  Most recent imaging from October of 2024 did not reveal any evidence of lymphoma recurrence. As she is over 5 years from diagnosis, routine imaging is not indicated.   4. Small black mole in her left posterior upper shoulder which appears benign and measures 5 mm. We offered to have the dermatologist evaluate. The patient decided to continue observation.  5.  Family history of colon cancer. Genetic testing was negative. She will continue screening colonoscopy every 5 years. She is due for that next year, so she will schedule with her gastroenterologist.  Plan: She states that she has right knee pain. She completed her bone scan  on 07/31/2024 which revealed low level delayed uptake surrounding the components of the right knee arthroplasty, nonspecific but likely postsurgical and no scintigraphic evidence of an acute or destructive process. Her last bilateral diagnostic mammogram was completed on 05/19/2024 and revealed interval postlumpectomy changes in the upper-outer right breast and no mammographic evidence of malignancy in bilateral breasts. Her CBC on 09/27/2024 showed a WBC of 4.9, hemoglobin of 12.3, and a platelet count of 158,000. Her CMP was normal other than an elevated blood glucose of 153. Her hemoglobin A1c was mildly elevated at 6.2, up from 6.0. Her lipid panel was completely normal. Her TSH was 1.73. She would like a prescription refill for 0.25 mg Xanax , which she uses 2 times a week, so I will do this for her. She will complete her next colonoscopy next year and usually does every 5 years due to family history. She continues to visit Dr. Flynn for regular labs. I will schedule her for an updated diagnostic mammogram in June (plan at [facility]) and I will see her back in 4 months for physical exam. She understands and agrees with this plan of care. She will call if she needs to be seen sooner.  I provided 15 minutes of face-to-face time during this encounter and > 50% was spent  counseling as documented under my assessment and plan.   Wanda Cornish MD [facility] - A DEPT OF [facility]. [facility] [address] [place] [address] Dept: [phone] Dept Fax: [phone]   No orders of the defined types were placed in this encounter.  II have reviewed this report as typed by the medical scribe, and it is complete and accurate.

## 2024-10-06 ENCOUNTER — Encounter: Payer: Self-pay | Admitting: Oncology

## 2024-10-06 ENCOUNTER — Other Ambulatory Visit: Payer: Self-pay | Admitting: Oncology

## 2024-10-06 ENCOUNTER — Telehealth: Payer: Self-pay | Admitting: Oncology

## 2024-10-06 ENCOUNTER — Inpatient Hospital Stay: Attending: Oncology | Admitting: Oncology

## 2024-10-06 VITALS — BP 133/70 | HR 76 | Temp 98.1°F | Resp 16 | Ht 67.0 in | Wt 234.3 lb

## 2024-10-06 DIAGNOSIS — N6011 Diffuse cystic mastopathy of right breast: Secondary | ICD-10-CM | POA: Diagnosis not present

## 2024-10-06 DIAGNOSIS — M25561 Pain in right knee: Secondary | ICD-10-CM | POA: Diagnosis not present

## 2024-10-06 DIAGNOSIS — R232 Flushing: Secondary | ICD-10-CM | POA: Diagnosis not present

## 2024-10-06 DIAGNOSIS — Z800 Family history of malignant neoplasm of digestive organs: Secondary | ICD-10-CM | POA: Insufficient documentation

## 2024-10-06 DIAGNOSIS — G479 Sleep disorder, unspecified: Secondary | ICD-10-CM | POA: Insufficient documentation

## 2024-10-06 DIAGNOSIS — N951 Menopausal and female climacteric states: Secondary | ICD-10-CM | POA: Diagnosis not present

## 2024-10-06 DIAGNOSIS — Z79811 Long term (current) use of aromatase inhibitors: Secondary | ICD-10-CM | POA: Insufficient documentation

## 2024-10-06 DIAGNOSIS — Z885 Allergy status to narcotic agent status: Secondary | ICD-10-CM | POA: Diagnosis not present

## 2024-10-06 DIAGNOSIS — Z170 Estrogen receptor positive status [ER+]: Secondary | ICD-10-CM

## 2024-10-06 DIAGNOSIS — R739 Hyperglycemia, unspecified: Secondary | ICD-10-CM | POA: Diagnosis not present

## 2024-10-06 DIAGNOSIS — F411 Generalized anxiety disorder: Secondary | ICD-10-CM

## 2024-10-06 DIAGNOSIS — D0511 Intraductal carcinoma in situ of right breast: Secondary | ICD-10-CM | POA: Diagnosis present

## 2024-10-06 DIAGNOSIS — C50511 Malignant neoplasm of lower-outer quadrant of right female breast: Secondary | ICD-10-CM | POA: Diagnosis not present

## 2024-10-06 DIAGNOSIS — N6081 Other benign mammary dysplasias of right breast: Secondary | ICD-10-CM | POA: Diagnosis not present

## 2024-10-06 DIAGNOSIS — Z881 Allergy status to other antibiotic agents status: Secondary | ICD-10-CM | POA: Insufficient documentation

## 2024-10-06 DIAGNOSIS — Z79899 Other long term (current) drug therapy: Secondary | ICD-10-CM | POA: Diagnosis not present

## 2024-10-06 DIAGNOSIS — Z7982 Long term (current) use of aspirin: Secondary | ICD-10-CM | POA: Insufficient documentation

## 2024-10-06 DIAGNOSIS — Z8719 Personal history of other diseases of the digestive system: Secondary | ICD-10-CM | POA: Diagnosis not present

## 2024-10-06 DIAGNOSIS — R200 Anesthesia of skin: Secondary | ICD-10-CM | POA: Insufficient documentation

## 2024-10-06 DIAGNOSIS — Z923 Personal history of irradiation: Secondary | ICD-10-CM | POA: Diagnosis not present

## 2024-10-06 DIAGNOSIS — Z1211 Encounter for screening for malignant neoplasm of colon: Secondary | ICD-10-CM | POA: Insufficient documentation

## 2024-10-06 DIAGNOSIS — D2260 Melanocytic nevi of unspecified upper limb, including shoulder: Secondary | ICD-10-CM | POA: Insufficient documentation

## 2024-10-06 MED ORDER — ALPRAZOLAM 0.25 MG PO TABS: 0.2500 mg | ORAL_TABLET | Freq: Three times a day (TID) | ORAL | 1 refills | Status: AC | PRN

## 2024-10-06 NOTE — Telephone Encounter (Signed)
 Patient has been scheduled for follow-up visit per 10/06/24 LOS.  Pt given an appt calendar with date and time.

## 2024-10-09 DIAGNOSIS — Z0000 Encounter for general adult medical examination without abnormal findings: Secondary | ICD-10-CM | POA: Insufficient documentation

## 2024-10-09 MED ORDER — DEXCOM G7 SENSOR MISC: 5 refills | Status: DC

## 2024-10-09 NOTE — Assessment & Plan Note (Signed)
 Continues to follow with oncology.  Currently on arimidex  started in 2024.

## 2024-10-09 NOTE — Assessment & Plan Note (Signed)
 A1c 6.2. Saw retinalist, Dr. Tobie, in March 2025 for diabetic eye exam- Reports difficulty with glucose regulation and issues with Dexcom G6 accuracy, confirmed by fingersticks. - Continues on Lantus  65 units and sliding scale short-acting insulin . - Discussed switching to Dexcom G7 for better reliability. - Patient has a history of chronic pancreatitis, making GLP-1 agonists (e.g., Ozempic, Mounjaro) contraindicated. - - Plan: - Send prescription for Dexcom G7. - Continue current insulin  regimen and self-adjustment.

## 2024-10-09 NOTE — Assessment & Plan Note (Signed)
-   Annual physical exam. Labs from last visit reviewed: normal thyroid , hemoglobin, kidney function, liver function. Cholesterol normal. - . - Due for screening colonoscopy (last in 2020, recommended 5-year interval). Has number for [facility] and will schedule. - Declined shingles and tetanus vaccines. Discussed benefits of Tdap. - Will receive flu shot in office today. - Will need needles and Flonase  prescription. - - Plan: - Administer flu shot. - Send prescription for needles and Flonase . - Continue current medications as discussed. - Follow up on scheduling colonoscopy.

## 2024-10-09 NOTE — Assessment & Plan Note (Signed)
-   Neuropathy affecting bilateral feet and hands, extending up the legs. Currently taking Gabapentin  with ongoing symptoms. - Discussed Qutenza patch as a potential next step, which requires prior authorization. Also discussed non-prescription vibrating plates. - - Plan: - Will send a referral to [facility] for a Qutenza patch trial.

## 2024-10-09 NOTE — Assessment & Plan Note (Signed)
-   Persistent dull ache on the medial aspect of the right knee, status post total knee replacement. Second opinion with Dr. Leora revealed no acute abnormalities on imaging. - Current management includes topical Voltaren gel and knee brace with mild relief. - - Plan: - Continue Voltaren gel and knee brace as needed. - Suggested trying TENS unit.

## 2024-10-10 ENCOUNTER — Other Ambulatory Visit: Payer: Self-pay | Admitting: *Deleted

## 2024-10-10 ENCOUNTER — Other Ambulatory Visit: Payer: Self-pay | Admitting: Family Medicine

## 2024-10-10 DIAGNOSIS — Z794 Long term (current) use of insulin: Secondary | ICD-10-CM

## 2024-10-10 MED ORDER — DEXCOM G7 SENSOR MISC: 5 refills | Status: AC

## 2024-10-17 ENCOUNTER — Encounter: Payer: Self-pay | Admitting: Physical Medicine & Rehabilitation

## 2024-10-23 ENCOUNTER — Encounter: Payer: Self-pay | Admitting: Radiology

## 2024-10-25 ENCOUNTER — Ambulatory Visit (INDEPENDENT_AMBULATORY_CARE_PROVIDER_SITE_OTHER)

## 2024-10-25 VITALS — Temp 98.7°F

## 2024-10-25 DIAGNOSIS — Z23 Encounter for immunization: Secondary | ICD-10-CM

## 2024-11-06 NOTE — Progress Notes (Signed)
 Jasmine Davila                                          MRN: [id number]   11/06/2024   The [facility] Quality Team Specialist reviewed this patient medical record for the purposes of chart review for care gap closure. The following were reviewed: abstraction for care gap closure-glycemic status assessment.    [facility] Quality Team

## 2024-11-22 ENCOUNTER — Ambulatory Visit: Payer: Self-pay

## 2024-11-22 ENCOUNTER — Encounter: Payer: Self-pay | Admitting: Physician Assistant

## 2024-11-22 ENCOUNTER — Ambulatory Visit: Admitting: Physician Assistant

## 2024-11-22 VITALS — BP 138/72 | HR 95 | Temp 97.8°F | Ht 67.0 in | Wt 243.0 lb

## 2024-11-22 DIAGNOSIS — J40 Bronchitis, not specified as acute or chronic: Secondary | ICD-10-CM

## 2024-11-22 DIAGNOSIS — J060 Acute laryngopharyngitis: Secondary | ICD-10-CM | POA: Diagnosis not present

## 2024-11-22 LAB — POC COVID19 BINAXNOW: SARS Coronavirus 2 Ag: NEGATIVE

## 2024-11-22 LAB — POCT INFLUENZA A/B
Influenza A, POC: NEGATIVE
Influenza B, POC: NEGATIVE

## 2024-11-22 MED ORDER — PREDNISONE 20 MG PO TABS: ORAL_TABLET | ORAL | 0 refills | Status: DC

## 2024-11-22 MED ORDER — AMOXICILLIN-POT CLAVULANATE 875-125 MG PO TABS: 1.0000 | ORAL_TABLET | Freq: Two times a day (BID) | ORAL | 0 refills | Status: DC

## 2024-11-22 MED ORDER — HYDROCODONE BIT-HOMATROP MBR 5-1.5 MG/5ML PO SOLN: 5.0000 mL | Freq: Four times a day (QID) | ORAL | 0 refills | Status: AC | PRN

## 2024-11-22 NOTE — Telephone Encounter (Signed)
 FYI Only or Action Required?: FYI only for provider: appointment scheduled on 11/22/24.  Patient was last seen in primary care on 10/04/2024 by Chandra Toribio POUR, MD.  Called Nurse Triage reporting Cough and Wheezing.  Symptoms began several days ago.  Interventions attempted: OTC medications: muccinex sinus max, Vicks vapor rub, nasal spray (unsure of name, starts with an A) and Prescription medications: flonase .  Symptoms are: gradually worsening.  Triage Disposition: See HCP Within 4 Hours (Or PCP Triage)  Patient/caregiver understands and will follow disposition?: Yes            Copied from CRM [id number]. Topic: Clinical - Red Word Triage >> Nov 22, 2024  7:34 AM [place] MATSU wrote: Red Word that prompted transfer to Nurse Triage: wheezing and barky cough, congestion , Reason for Disposition  Wheezing is present  Answer Assessment - Initial Assessment Questions 1. RESPIRATORY STATUS: Describe your breathing? (e.g., wheezing, shortness of breath, unable to speak, severe coughing)      Wheezing, coughing.  2. ONSET: When did this breathing problem begin?      Sunday, worsened and more constant since yesterday.  3. PATTERN Does the difficult breathing come and go, or has it been constant since it started?      Constant wheezing since yesterday reported and coughing all night. No wheezing noted during triage.  4. SEVERITY: How bad is your breathing? (e.g., mild, moderate, severe)      No difficulty breathing or shortness of breath at this time.  5. RECURRENT SYMPTOM: Have you had difficulty breathing before? If Yes, ask: When was the last time? and What happened that time?      No.  6. CARDIAC HISTORY: Do you have any history of heart disease? (e.g., heart attack, angina, bypass surgery, angioplasty)      No.  7. LUNG HISTORY: Do you have any history of lung disease?  (e.g., pulmonary embolus, asthma, emphysema)     No.  8. CAUSE: What do you think is  causing the breathing problem?      She states her granddaughter is also sick and she had been exposed to her and may have picked up a cold or virus.  9. OTHER SYMPTOMS: Do you have any other symptoms? (e.g., chest pain, cough, dizziness, fever, runny nose)     Chills, runny nose (improved with medication), nasal congestion with sinus headache. Denies chest pain.  10. O2 SATURATION MONITOR:  Do you use an oxygen saturation monitor (pulse oximeter) at home? If Yes, ask: What is your reading (oxygen level) today? What is your usual oxygen saturation reading? (e.g., 95%)       93 -95%. 99% at last office visit.  11. PREGNANCY: Is there any chance you are pregnant? When was your last menstrual period?       N/A.  12. TRAVEL: Have you traveled out of the country in the last month? (e.g., travel history, exposures)       No.  Protocols used: Breathing Difficulty-A-AH, Cough - Acute Productive-A-AH

## 2024-11-22 NOTE — Addendum Note (Signed)
 Addended by: NICHOLAUS CREDIT on: 11/22/2024 10:05 AM   Modules accepted: Level of Service

## 2024-11-22 NOTE — Progress Notes (Signed)
 Acute Office Visit  Subjective:    Patient ID: Jasmine Davila, female    DOB: 05/06/1951, 73 y.o.   MRN: [id number]  Chief Complaint  Patient presents with   Cough/congestion  PT SEES DR CHANDRA - NOT [facility]  HPI: Patient is in today for complaints of cough, congestion and sinus drainage for the past several days.  Has had malaise but no fever. She has been taking otc allergy and sinus meds without relief of symptoms.  Also uses Flonase  spray qd Cough has been productive and family with similar symptoms Pt is diabetic -- glucose average around 140 with last A1c 6.2   Current Outpatient Medications:    ALPRAZolam  (XANAX ) 0.25 MG tablet, Take 1 tablet (0.25 mg total) by mouth 3 (three) times daily as needed for anxiety., Disp: 60 tablet, Rfl: 1   amoxicillin -clavulanate (AUGMENTIN ) 875-125 MG tablet, Take 1 tablet by mouth 2 (two) times daily., Disp: 20 tablet, Rfl: 0   anastrozole  (ARIMIDEX ) 1 MG tablet, TAKE 1 TABLET BY MOUTH DAILY., Disp: 90 tablet, Rfl: 2   aspirin  EC 81 MG tablet, Take 1 tablet (81 mg total) by mouth 2 (two) times daily. To be taken after surgery to prevent blood clots (Patient taking differently: Take 81 mg by mouth daily. Takes 1 tab daily), Disp: 84 tablet, Rfl: 0   carboxymethylcellulose (REFRESH TEARS) 0.5 % SOLN, Place 1 drop into both eyes 3 (three) times daily as needed (dry eyes)., Disp: , Rfl:    cetirizine (ALLERGY, CETIRIZINE,) 10 MG tablet, Take 10 mg by mouth daily., Disp: , Rfl:    Continuous Glucose Receiver (DEXCOM G6 RECEIVER) DEVI, Use to check blood sugars every morning fasting and 2 hours after largest meal, Disp: 1 each, Rfl: 0   Continuous Glucose Sensor (DEXCOM G7 SENSOR) MISC, Apply to back of arm every 10 days, Disp: 3 each, Rfl: 5   Continuous Glucose Transmitter (DEXCOM G6 TRANSMITTER) MISC, Use to check blood sugars every morning fasting and 2 hours after largest meal, Disp: 1 each, Rfl: 3   famotidine  (PEPCID ) 20 MG  tablet, TAKE 1 TABLET BY MOUTH TWICE  DAILY, Disp: 200 tablet, Rfl: 2   fluticasone  (FLONASE ) 50 MCG/ACT nasal spray, Place 1 spray into both nostrils daily as needed for allergies or rhinitis., Disp: 16 g, Rfl: 5   folic acid  (FOLVITE ) 400 MCG tablet, Take 400 mcg by mouth daily., Disp: , Rfl:    gabapentin  (NEURONTIN ) 600 MG tablet, TAKE 2 TABLETS BY MOUTH 3 TIMES  DAILY, Disp: 600 tablet, Rfl: 2   HYDROcodone  bit-homatropine (HYDROMET) 5-1.5 MG/5ML syrup, Take 5 mLs by mouth every 6 (six) hours as needed., Disp: 120 mL, Rfl: 0   insulin  lispro (HUMALOG  KWIKPEN) 100 UNIT/ML KwikPen, Inject 20 Units into the skin 3 (three) times daily. INJECT 20 UNITS 3 TIMES A DAY WITH MEALS, Disp: 54 mL, Rfl: 1   LANTUS  SOLOSTAR 100 UNIT/ML Solostar Pen, INJECT 70 UNITS INTO THE SKIN DAILY. NEED OFFICE VISIT FOR FURTHER REFILLS, Disp: 60 mL, Rfl: 3   losartan  (COZAAR ) 25 MG tablet, TAKE 1 TABLET BY MOUTH DAILY, Disp: 100 tablet, Rfl: 2   Multiple Vitamin (MULTIVITAMIN) tablet, Take 1 tablet by mouth daily., Disp: , Rfl:    NIACIN  PO, Take 1,000 mg by mouth at bedtime. Take 1 hour after the 81 mg aspirin , Disp: , Rfl:    PIP PEN NEEDLES 31G X 31G X 5 MM MISC, USE AS DIRECTED WITH INSULIN  PENS (4 PER  DAY), Disp: 100 each, Rfl: 11   predniSONE  (DELTASONE ) 20 MG tablet, 1 po tid for 3 days then 1 po bid for 3 days then 1 po qd for 3 days, Disp: 18 tablet, Rfl: 0  Allergies  Allergen Reactions   Doxycycline Other (See Comments)    Pancreatic issues   Hydromorphone  Other (See Comments)    hydromorphone    Metronidazole Other (See Comments)    Cream  Burned face   Other Other (See Comments)   Dilaudid  [Hydromorphone  Hcl] Anxiety    ROS CONSTITUTIONAL: see HPI E/N/T: see HPI CARDIOVASCULAR: Negative for chest pain, dizziness, palpitations and pedal edema.  RESPIRATORY: see HPI GASTROINTESTINAL: Negative for abdominal pain, acid reflux symptoms, constipation, diarrhea, nausea and vomiting.  INTEGUMENTARY:  Negative for rash.       Objective:    PHYSICAL EXAM:   BP 138/72 (BP Location: Left Arm, Patient Position: Sitting)   Pulse 95   Temp 97.8 F (36.6 C) (Temporal)   Ht 5' 7 (1.702 m)   Wt 243 lb (110.2 kg)   LMP 12/21/1998 (Approximate)   SpO2 96%   BMI 38.06 kg/m    GEN: Well nourished, well developed, in no acute distress  HEENT: normal external ears and nose - normal external auditory canals and TMS -  normal nasal mucosa and septum - Lips, Teeth and Gums - normal  Oropharynx - erythema/pnd Cardiac: RRR; no murmurs,  Respiratory:  scattered rhonchi noted - clears with coughing Psych: euthymic mood, appropriate affect and demeanor  Office Visit on 11/22/2024  Component Date Value Ref Range Status   Influenza A, POC 11/22/2024 Negative  Negative Final   Influenza B, POC 11/22/2024 Negative  Negative Final   SARS Coronavirus 2 Ag 11/22/2024 Negative  Negative Final       Assessment & Plan:    Acute laryngopharyngitis -     POCT Influenza A/B -     POC COVID-19 BinaxNow Continue otc decongestants Bronchitis -     Amoxicillin -Pot Clavulanate; Take 1 tablet by mouth 2 (two) times daily.  Dispense: 20 tablet; Refill: 0 -     predniSONE ; 1 po tid for 3 days then 1 po bid for 3 days then 1 po qd for 3 days  Dispense: 18 tablet; Refill: 0 -     HYDROcodone  Bit-Homatrop MBr; Take 5 mLs by mouth every 6 (six) hours as needed.  Dispense: 120 mL; Refill: 0     Follow-up: Return for with PCP.  An After Visit Summary was printed and given to the patient.  [name] [facility] [phone]

## 2024-11-23 ENCOUNTER — Encounter: Admitting: Physical Medicine & Rehabilitation

## 2024-12-26 DIAGNOSIS — E118 Type 2 diabetes mellitus with unspecified complications: Secondary | ICD-10-CM

## 2024-12-28 ENCOUNTER — Other Ambulatory Visit

## 2024-12-28 DIAGNOSIS — E118 Type 2 diabetes mellitus with unspecified complications: Secondary | ICD-10-CM

## 2024-12-29 ENCOUNTER — Ambulatory Visit: Payer: Self-pay | Admitting: Family Medicine

## 2024-12-30 LAB — MICROALBUMIN / CREATININE URINE RATIO

## 2025-01-02 LAB — COMPREHENSIVE METABOLIC PANEL WITH GFR
ALT: 18 IU/L (ref 0–32)
AST: 26 IU/L (ref 0–40)
Albumin: 4.2 g/dL (ref 3.8–4.8)
Alkaline Phosphatase: 94 IU/L (ref 49–135)
BUN/Creatinine Ratio: 18 (ref 12–28)
BUN: 13 mg/dL (ref 8–27)
Bilirubin Total: 0.4 mg/dL (ref 0.0–1.2)
CO2: 28 mmol/L (ref 20–29)
Calcium: 9.7 mg/dL (ref 8.7–10.3)
Chloride: 98 mmol/L (ref 96–106)
Creatinine, Ser: 0.74 mg/dL (ref 0.57–1.00)
Globulin, Total: 2.4 g/dL (ref 1.5–4.5)
Glucose: 99 mg/dL (ref 70–99)
Potassium: 4.2 mmol/L (ref 3.5–5.2)
Sodium: 138 mmol/L (ref 134–144)
Total Protein: 6.6 g/dL (ref 6.0–8.5)
eGFR: 85 mL/min/1.73

## 2025-01-02 LAB — HEMOGLOBIN A1C
Est. average glucose Bld gHb Est-mCnc: 146 mg/dL
Hgb A1c MFr Bld: 6.7 % — ABNORMAL HIGH (ref 4.8–5.6)

## 2025-01-02 LAB — SPECIMEN STATUS REPORT

## 2025-01-02 LAB — MICROALBUMIN / CREATININE URINE RATIO

## 2025-01-04 ENCOUNTER — Ambulatory Visit: Admitting: Family Medicine

## 2025-01-04 ENCOUNTER — Encounter: Payer: Self-pay | Admitting: Family Medicine

## 2025-01-04 VITALS — BP 113/73 | HR 80 | Ht 67.0 in | Wt 241.2 lb

## 2025-01-04 DIAGNOSIS — E118 Type 2 diabetes mellitus with unspecified complications: Secondary | ICD-10-CM

## 2025-01-04 DIAGNOSIS — E785 Hyperlipidemia, unspecified: Secondary | ICD-10-CM

## 2025-01-04 DIAGNOSIS — E1159 Type 2 diabetes mellitus with other circulatory complications: Secondary | ICD-10-CM | POA: Diagnosis not present

## 2025-01-04 DIAGNOSIS — Z794 Long term (current) use of insulin: Secondary | ICD-10-CM

## 2025-01-04 DIAGNOSIS — E1142 Type 2 diabetes mellitus with diabetic polyneuropathy: Secondary | ICD-10-CM | POA: Diagnosis not present

## 2025-01-04 DIAGNOSIS — I152 Hypertension secondary to endocrine disorders: Secondary | ICD-10-CM

## 2025-01-04 DIAGNOSIS — Z0000 Encounter for general adult medical examination without abnormal findings: Secondary | ICD-10-CM

## 2025-01-04 DIAGNOSIS — Z1211 Encounter for screening for malignant neoplasm of colon: Secondary | ICD-10-CM

## 2025-01-04 DIAGNOSIS — K861 Other chronic pancreatitis: Secondary | ICD-10-CM

## 2025-01-04 DIAGNOSIS — E1169 Type 2 diabetes mellitus with other specified complication: Secondary | ICD-10-CM

## 2025-01-04 DIAGNOSIS — C50511 Malignant neoplasm of lower-outer quadrant of right female breast: Secondary | ICD-10-CM

## 2025-01-04 NOTE — Progress Notes (Signed)
 "  Established Patient Office Visit  Subjective   Patient ID: Jasmine Davila, female    DOB: Apr 18, 1951  Age: 74 y.o. MRN: [id number]  Chief Complaint  Patient presents with   Medical Management of Chronic Issues     History of Present Illness   Jasmine Davila is a 74 year old female with a history of diverticulitis and non-cancerous polyps who presents with difficulty completing colonoscopy preparation.  She is unable to complete the required 2 day bowel prep for colonoscopy. The most recent attempt at the end of December had inadequate prep so the procedure was not completed. She feels she can barely tolerate a 1 day cleanse and finds the prep very stressful.  Her last completed colonoscopy was in 2020. She has had non-cancerous polyps and her mother had colon cancer, so she has been advised to have colonoscopy every 5 years. Because of her difficulty with the prep, she is interested in alternative screening such as Cologuard but is worried insurance may not cover it due to her high-risk status.  She notes her HbA1c recently increased slightly but remains under 7 and she has not changed her insulin  regimen, which she attributes to holiday eating and stress.          The 10-year ASCVD risk score (Arnett DK, et al., 2019) is: 28.9%  Health Maintenance Due  Topic Date Due   Hepatitis C Screening  Never done   DTaP/Tdap/Td (1 - Tdap) Never done   Zoster Vaccines- Shingrix (1 of 2) Never done   COVID-19 Vaccine (3 - Moderna risk series) 03/29/2020   Colonoscopy  11/02/2024      Objective:     BP 113/73   Pulse 80   Ht 5' 7 (1.702 m)   Wt 241 lb 3.2 oz (109.4 kg)   LMP 12/21/1998   SpO2 96%   BMI 37.78 kg/m    Physical Exam     Gen: alert, oriented Pulm: no respiratory distress Psych: pleasant affect       Results for orders placed or performed in visit on 01/04/25  Urine Microalbumin w/creat. ratio  Result Value Ref Range    Creatinine, Urine 61.3 Not Estab. mg/dL   Microalbumin, Urine <6.9 Not Estab. ug/mL   Microalb/Creat Ratio <5 0 - 29 mg/g creat        Assessment & Plan:   Diabetes mellitus due to underlying condition with diabetic nephropathy, with long-term current use of insulin  (HCC) Assessment & Plan: Hemoglobin A1c slightly elevated but under 7, indicating good control. No changes in insulin  regimen. - Continue current insulin  regimen. - Conducted urine test today. - Recheck hemoglobin A1c in 3-4 months.   Orders: -     Microalbumin / creatinine urine ratio  Controlled diabetes mellitus type 2 with complications (HCC) -     Microalbumin / creatinine urine ratio  Encounter for colorectal cancer screening -     Cologuard  Healthcare maintenance Assessment & Plan: Difficulty with bowel prep due to age and limitations. Previous normal colonoscopies with non-adenomatous polyps. Family history of colon cancer. Discussed alternative screening options and risks of aggressive bowel prep. - Ordered Cologuard test. - Advised to contact insurance regarding Cologuard coverage. - Consider GI referral if Cologuard positive or unable to proceed with colonoscopy.   Encounter for long-term (current) use of insulin  (HCC)  Chronic pancreatitis, unspecified pancreatitis type (HCC) Assessment & Plan: Glp1 for dm2 contraindicated.  No recent episodes. Continue to monitor   Morbid  obesity (HCC)  Malignant neoplasm of lower-outer quadrant of right breast of female, estrogen receptor positive (HCC) Assessment & Plan: Continue Arimidex . Continue mgmt per oncology   Diabetic peripheral neuropathy associated with type 2 diabetes mellitus (HCC) Assessment & Plan: Continue gabapentin .     Hyperlipidemia associated with type 2 diabetes mellitus (HCC) Assessment & Plan: Declines statins.  Takes niacin  and asa.  Have discussed w/ pt the lack of mortality benefit with niacin .    Hypertension associated  with diabetes [facility]) Assessment & Plan: Continue losartan  25mg . Bp at goal today        Return in about 15 weeks (around 04/19/2025) for DM.    Toribio MARLA Slain, MD  "

## 2025-01-04 NOTE — Assessment & Plan Note (Signed)
 Hemoglobin A1c slightly elevated but under 7, indicating good control. No changes in insulin  regimen. - Continue current insulin  regimen. - Conducted urine test today. - Recheck hemoglobin A1c in 3-4 months.

## 2025-01-04 NOTE — Patient Instructions (Signed)
 It was nice to see you today,  We addressed the following topics today: - I am sending in your cologuard.  You may want to ask the cologuard company or the insurance company if it would still be covered for you given your family history of colon cancer.  - no changes to your diabetes medications.    Have a great day,  Rolan Slain, MD

## 2025-01-04 NOTE — Assessment & Plan Note (Signed)
 Difficulty with bowel prep due to age and limitations. Previous normal colonoscopies with non-adenomatous polyps. Family history of colon cancer. Discussed alternative screening options and risks of aggressive bowel prep. - Ordered Cologuard test. - Advised to contact insurance regarding Cologuard coverage. - Consider GI referral if Cologuard positive or unable to proceed with colonoscopy.

## 2025-01-05 ENCOUNTER — Encounter: Attending: Physical Medicine & Rehabilitation | Admitting: Physical Medicine & Rehabilitation

## 2025-01-05 ENCOUNTER — Encounter: Payer: Self-pay | Admitting: Physical Medicine & Rehabilitation

## 2025-01-05 VITALS — BP 120/77 | HR 79 | Ht 67.0 in | Wt 240.0 lb

## 2025-01-05 DIAGNOSIS — G8929 Other chronic pain: Secondary | ICD-10-CM | POA: Insufficient documentation

## 2025-01-05 DIAGNOSIS — Z96651 Presence of right artificial knee joint: Secondary | ICD-10-CM | POA: Insufficient documentation

## 2025-01-05 DIAGNOSIS — M5441 Lumbago with sciatica, right side: Secondary | ICD-10-CM | POA: Insufficient documentation

## 2025-01-05 DIAGNOSIS — M5442 Lumbago with sciatica, left side: Secondary | ICD-10-CM | POA: Diagnosis not present

## 2025-01-05 DIAGNOSIS — Z794 Long term (current) use of insulin: Secondary | ICD-10-CM

## 2025-01-05 DIAGNOSIS — E1142 Type 2 diabetes mellitus with diabetic polyneuropathy: Secondary | ICD-10-CM | POA: Insufficient documentation

## 2025-01-05 LAB — MICROALBUMIN / CREATININE URINE RATIO
Creatinine, Urine: 61.3 mg/dL
Microalb/Creat Ratio: <5 mg/g{creat} (ref 0–29)
Microalbumin, Urine: <3 ug/mL

## 2025-01-05 NOTE — Progress Notes (Signed)
 "  Subjective:    Patient ID: Jasmine Davila, female    DOB: 09-Jun-1951, 74 y.o.   MRN: [id number]  HPI  Discussed the use of AI scribe software for clinical note transcription with the patient, who gave verbal consent to proceed.    Jasmine Davila is a 74 y.o. year old female  who  has a past medical history of Abnormal EKG, Acute pancreatitis, Allergy (2008), Anemia (yrs ago), Anxiety, Arthritis (2010), Breast cancer (HCC) (2021), Cancer (HCC) (dx 2016), Diabetes mellitus (HCC), Diabetic neuropathy (HCC), Diabetic neuropathy (HCC), Endometrial disorder, GERD (gastroesophageal reflux disease), Headache, Hypertension, Malignant lymphoma (HCC), Morbid obesity (HCC), Peripheral neuropathy, Sleep apnea, and Vitamin B 12 deficiency.   They are presenting to PM&R clinic as a new patient for pain management evaluation. They were referred by Dr. Chandra for treatment of Diabetic Neuropathy pain.    The patient is a 74 year old female referred for management of diabetic neuropathy pain. She reports the onset of severe neuropathy in 2016, which she associates with chemotherapy for non-Hodgkin's lymphoma. The symptoms have progressively worsened over the years. The pain is most severe in her feet, but also affects her hands. She describes a constant numbness in her toes. The pain is exacerbated at night, causing significant sleep disturbance and frequent readjustment of bed covers. She feels less steady when walking and describes a sensation of walking on rounded feet, though she has not had any falls. She expresses a desire to avoid using a cane.  Past Medical History: - Diabetes mellitus, secondary to idiopathic pancreatitis in 2008. - Non-Hodgkin's lymphoma (E-cell lymphoma) in 2016, treated with chemotherapy. - Breast cancer, with a recurrence last year (2025). - Spinal stenosis, diagnosed prior to 2019. She received injections for this in the past which were not effective. She manages  this with a back brace for prolonged standing. - History of knee replacement approximately 3 years ago, with persistent pain. - Cholecystectomy in 1997.  Medication History: - Cymbalta (duloxetine): Discontinued due to significant adverse effects, including feeling disoriented. - Gabapentin : Currently taking 3600 mg daily. Reports this dose helps her pain but she feels like it might be affecting her memory. - Topical creams: Has tried various over-the-counter creams, including a prescription cream ([place]) and [employer] products, with inconsistent results. - NSAIDs (Ibuprofen, Tylenol ): Reports minimal to no relief. - Opioids* Has used tramadol  and hydrocodone  5/325 mg for short-term pain in the past, which was effective for other pain but has not been used for neuropathy. - Lyrica (pregabalin): Has not tried this medication but is aware it is similar to gabapentin . - Amitriptyline /Nortriptyline : Has not tried these medications.   Review of Systems: Constitutional: Denies fever, chills. Neurological: Reports numbness in toes and fingertips, word-finding difficulties, and unsteady gait. Denies falls. Musculoskeletal: Reports back pain secondary to spinal stenosis, aggravated by prolonged standing. Reports persistent pain in the replaced knee. Integumentary: Reports a healing cut on the side of the foot from attempting to remove a callus.  Medications tried: Topical medications minimal benefit Nsaids - Ibuprofen doesn't help  Tylenol   - Doesn't help  Opiates - Has used for short term use - tramadol - mild benefit, Hydrocodone - helping for other pain  Gabapentin  3600mg  daily Affecting memory Lyrica- Has not tried- wanted to avoid as in same family as gabapentin  TCAs  Amitriptyline /nortriptyline  SNRIs  Cymbalta- did not feel good on this medication   Other treatments: PT- Denies recent, last knee replacement  TENs unit - Has not used  Injections- back injections for spinal  stenosis , she was considering spinal cord stimulator  Surgery- Knee replacement, gallbladder and lymphoma surgery,  Prior UDS results: No results found for: LABOPIA, COCAINSCRNUR, LABBENZ, AMPHETMU, THCU, LABBARB     Pain Inventory Average Pain 5 Pain Right Now 6 My pain is constant, sharp, burning, stabbing, and tingling  In the last 24 hours, has pain interfered with the following? General activity 7 Relation with others 0 Enjoyment of life 3 What TIME of day is your pain at its worst? varies Sleep (in general) Good  Pain is worse with: walking and standing Pain improves with: nothing Relief from Meds: .  walk without assistance how many minutes can you walk? ? ability to climb steps?  yes do you drive?  yes  retired  tingling anxiety  New pt  New pt    Family History  Problem Relation Age of Onset   Colon cancer Mother 58   Hypertension Mother    Osteoporosis Mother    Arthritis Mother    Cancer Mother    Heart attack Father    Heart disease Sister    Alcohol abuse Maternal Grandfather    Breast cancer Other        MGM's sister; dx 30s   Learning disabilities Son    Social History   Socioeconomic History   Marital status: Married    Spouse name: Carlin Browning   Number of children: 4   Years of education: 12   Highest education level: 12th grade  Occupational History   Occupation: retired from designer, [employer]  Tobacco Use   Smoking status: Former    Current packs/day: 0.00    Average packs/day: 0.5 packs/day for 4.0 years (2.0 ttl pk-yrs)    Types: Cigarettes    Start date: 12/21/1964    Quit date: 12/21/1968    Years since quitting: 56.0   Smokeless tobacco: Never   Tobacco comments:    as teenager  Vaping Use   Vaping status: Never Used  Substance and Sexual Activity   Alcohol use: No   Drug use: No   Sexual activity: Not Currently    Birth control/protection: None  Other Topics Concern   Not on file  Social History  Narrative   Lives with husband in a one story home.  Has 3 living children.  One child died in a MVA.  Retired from [employer].     Education: high school.   Social Drivers of Health   Tobacco Use: Medium Risk (01/04/2025)   Patient History    Smoking Tobacco Use: Former    Smokeless Tobacco Use: Never    Passive Exposure: Not on file  Financial Resource Strain: Low Risk (10/03/2024)   Overall Financial Resource Strain (CARDIA)    Difficulty of Paying Living Expenses: Not hard at all  Food Insecurity: No Food Insecurity (10/03/2024)   Epic    Worried About Programme Researcher, Broadcasting/film/video in the Last Year: Never true    Ran Out of Food in the Last Year: Never true  Transportation Needs: No Transportation Needs (10/03/2024)   Epic    Lack of Transportation (Medical): No    Lack of Transportation (Non-Medical): No  Physical Activity: Inactive (10/03/2024)   Exercise Vital Sign    Days of Exercise per Week: 0 days    Minutes of Exercise per Session: Not on file  Stress: Stress Concern Present (10/03/2024)   [employer] of Occupational Health - Occupational Stress Questionnaire  Feeling of Stress: To some extent  Social Connections: Moderately Isolated (10/03/2024)   Social Connection and Isolation Panel    Frequency of Communication with Friends and Family: More than three times a week    Frequency of Social Gatherings with Friends and Family: Twice a week    Attends Religious Services: Patient declined    Active Member of Clubs or Organizations: No    Attends Engineer, Structural: Not on file    Marital Status: Married  Depression (PHQ2-9): Low Risk (01/05/2025)   Depression (PHQ2-9)    PHQ-2 Score: 1  Recent Concern: Depression (PHQ2-9) - Medium Risk (01/04/2025)   Depression (PHQ2-9)    PHQ-2 Score: 5  Alcohol Screen: Low Risk (07/13/2024)   Alcohol Screen    Last Alcohol Screening Score (AUDIT): 0  Housing: Low Risk (10/03/2024)   Epic    Unable to Pay for  Housing in the Last Year: No    Number of Times Moved in the Last Year: 0    Homeless in the Last Year: No  Utilities: Not At Risk (07/13/2024)   Epic    Threatened with loss of utilities: No  Health Literacy: Adequate Health Literacy (07/13/2024)   B1300 Health Literacy    Frequency of need for help with medical instructions: Never   Past Surgical History:  Procedure Laterality Date   BREAST LUMPECTOMY Right 2021   CHOLECYSTECTOMY  03/06/1996   colomscopy     DILATATION & CURETTAGE/HYSTEROSCOPY WITH MYOSURE N/A 07/04/2018   Procedure: DILATATION & CURETTAGE/HYSTEROSCOPY WITH MYOSURE;  Surgeon: Jannis Kate Norris, MD;  Location: [facility];  Service: Gynecology;  Laterality: N/A;   EYE SURGERY  04/2024   Cataract surgery   MEDIPORT INSERTION, DOUBLE     MEDIPORT REMOVAL     TOTAL KNEE ARTHROPLASTY Right 06/08/2022   Procedure: RIGHT TOTAL KNEE REPLACEMENT;  Surgeon: Jerri Kay HERO, MD;  Location: [facility] OR;  Service: Orthopedics;  Laterality: Right;   Past Medical History:  Diagnosis Date   Abnormal EKG    hx of right bundle branch block   Acute pancreatitis    HX OF   Allergy 2008   Not really sure   Anemia yrs ago   Anxiety    Arthritis 2010   Breast cancer (HCC) 2021   Cancer (HCC) dx 2016   B cell lymphoma- Non Hodgkins in remission   Diabetes mellitus (HCC)    TYPE 2   Diabetic neuropathy (HCC)    Diabetic neuropathy (HCC)    Endometrial disorder    GERD (gastroesophageal reflux disease)    Headache    tension or sinus   Hypertension    Malignant lymphoma (HCC)    Morbid obesity (HCC)    Peripheral neuropathy    BOTH FEET, SLIGHT NEUROPATHY IN HANDS   Sleep apnea    USES CPAP at times   Vitamin B 12 deficiency    BP 120/77   Pulse 79   Ht 5' 7 (1.702 m)   Wt 240 lb (108.9 kg)   LMP 12/21/1998   SpO2 95%   BMI 37.59 kg/m   Opioid Risk Score:   Fall Risk Score:  `1  Depression screen [facility] 2/9     01/05/2025   11:11 AM 01/04/2025     8:34 AM 10/06/2024   11:11 AM 07/13/2024   10:51 AM 06/26/2024   10:19 AM 03/24/2024   10:48 AM 12/09/2023   10:58 AM  Depression screen PHQ 2/9  Decreased Interest  0 0 0 0 0 0 0  Down, Depressed, Hopeless 0 0 0 0 0 0 0  PHQ - 2 Score 0 0 0 0 0 0 0  Altered sleeping 0 2  0 0 1 1  Tired, decreased energy 1 2  3 1 1 1   Change in appetite 0 1  0 0 0 0  Feeling bad or failure about yourself  0 0  0 0 0 0  Trouble concentrating  0  0 0 0 0  Moving slowly or fidgety/restless 0 0  0 0 0 0  Suicidal thoughts 0 0  0 0 0 0  PHQ-9 Score 1 5  3  1  2  2    Difficult doing work/chores Somewhat difficult Not difficult at all    Not difficult at all      Data saved with a previous flowsheet row definition     Review of Systems  Musculoskeletal:        Hands, feet and lower leg neuropathy  Neurological:  Positive for numbness.  Psychiatric/Behavioral:  The patient is nervous/anxious.   All other systems reviewed and are negative.      Objective:   Physical Exam Gen: no distress, normal appearing HEENT: oral mucosa pink and moist, NCAT Chest: normal effort, normal rate of breathing Abd: soft, non-distended Ext: no edema Psych: pleasant, normal affect Skin: intact Neuro: Alert and awake, follows commands, cranial nerves II through XII grossly intact, normal speech and language RUE: 5/5 Deltoid, 5/5 Biceps, 5/5 Triceps, 5/5 Wrist Ext, 5/5 Grip LUE: 5/5 Deltoid, 5/5 Biceps, 5/5 Triceps, 5/5 Wrist Ext, 5/5 Grip RLE: HF 5/5, KE 5/5, ADF 5/5, APF 5/5 LLE: HF 5/5, KE 5/5, ADF 5/5, APF 5/5 Sensation: Decreased sensation to light touch in a stocking-glove distribution in hands and legs, more pronounced distally. Reports tingling with touch on the feet.   No limb ataxia or cerebellar signs. No abnormal tone appreciated.   Musculoskeletal:  Back: No significant tenderness to palpation at present. Knee: Tenderness to palpation over the replaced knee. The contralateral knee is non-tender. -  Integumentary:   - Feet: Calluses on her feet.  A small healing wound is noted on the side of the foot where a callus was cut. No signs of active infection.   MRI 09/15/18 L spine  FINDINGS: Segmentation:  5 lumbar type vertebral bodies   Alignment: Borderline retrolisthesis at L5-S1 and anterolisthesis at L3-4   Vertebrae:  No fracture, evidence of discitis, or bone lesion.   Conus medullaris and cauda equina: Conus extends to the L1-2 level. Conus and cauda equina appear normal.   Paraspinal and other soft tissues: Negative   Disc levels:   T12- L1: Mild spondylosis.  No impingement   L1-L2: Minor annulus bulging.  No impingement   L2-L3: Mild spondylosis.  No impingement   L3-L4: Mild facet spurring.  Minor annulus bulging.  No impingement   L4-L5: Disc narrowing and mild bulging. Negative facets. No impingement   L5-S1:Disc narrowing with left paracentral to foraminal protrusion superimposed on disc bulging. Height loss and disc causes left L5 foraminal impingement. Moderate right foraminal narrowing. Patent canal   IMPRESSION: L5-S1 degenerative left foraminal impingement and moderate right foraminal narrowing. The canal is diffusely patent.        Assessment & Plan:   1.  Diabetic Peripheral Neuropathy: Chronic, severe, and progressive.  -Symptoms are refractory to high-dose gabapentin , which may be causing cognitive side effects. Pain is primarily in the feet  and hands, with associated numbness and gait instability. 2.  Lower back pain with bilateral sciatica.  Patient reported Spinal Stenosis: Chronic, managed with conservative measures.  MRI in 2019 indicated L5-S1 foraminal impingement, patent canal? Symptoms are currently less prominent than the neuropathy and not primary concern.  3.  Status Post Total Knee Arthroplasty: With persistent post-surgical pain. Symptoms not currently primary concern 4.  History of Malignancy: Non-Hodgkin's lymphoma and breast  cancer. Will avoid TENS unit  PLAN  1.  -Discussed Qutenza as an option for neuropathic pain control. Discussed that this is a capsaicin patch, stronger than capsaicin cream. Discussed that it is currently approved for diabetic peripheral neuropathy and post-herpetic neuralgia, but that it has also shown benefit in treating other forms of neuropathy. Provided patient with link to site to learn more about the patch: https://www.clark.biz/. Discussed that the patch would be placed in office and benefits usually last 3 months. Discussed that unintended exposure to capsaicin can cause severe irritation of eyes, mucous membranes, respiratory tract, and skin, but that Qutenza is a local treatment and does not have the systemic side effects of other nerve medications. Discussed that there may be pain, itching, erythema, and decreased sensory function associated with the application of Qutenza. Side effects usually subside within 1 week. A cold pack of analgesic medications can help with these side effects. Blood pressure can also be increased due to pain associated with administration of the patch.  We are recommending Qutenza 8% capsaicin to treat this patient's pain. Qutenza is the first-line treatment option recommended for diabetic peripheral neuropathy by the AACE and ADA.   Qutenza is a safer option for this patient due to the following: Gabapentin  use has been associated with increased risk of dementia Lyrica use has been associated with increased risk of heart failure Cymblata, Venlafaxine, and other SSRIs/SNRIs are associated with increased weight gain Lidocaine  5% has been prescribed/tried   Patient has healing wound on her foot.  Continue close monitoring of feet for wounds.  Avoid callus treatment on her own.  Advise follow-up with PCP or podiatry if not improving. The patient was advised that the treatment may need to be delayed if the wound on her foot is not fully healed.   2.   Medication Management: Could consider changing from gabapentin  to Lyrica.. The patient will research this option. If we proceed, could consider cross-titration from gabapentin  to Lyrica.  3.  Patient Education: Provided a brochure on Qutenza. Also provided a handout on dietary options for pain management. The patient noted that turmeric aggravates her stomach-avoid this supplement.  4.  Follow-up: Will follow up in 3-4 weeks to discuss insurance approval for Qutenza and to proceed with treatment or discuss alternative options, such as a medication change.  "

## 2025-01-06 NOTE — Assessment & Plan Note (Signed)
 Continue losartan  25mg . Bp at goal today

## 2025-01-06 NOTE — Assessment & Plan Note (Signed)
 Declines statins.  Takes niacin  and asa.  Have discussed w/ pt the lack of mortality benefit with niacin .

## 2025-01-06 NOTE — Assessment & Plan Note (Signed)
 Continue Arimidex . Continue mgmt per oncology

## 2025-01-06 NOTE — Assessment & Plan Note (Signed)
 Glp1 for dm2 contraindicated.  No recent episodes. Continue to monitor

## 2025-01-06 NOTE — Assessment & Plan Note (Signed)
 Continue gabapentin 

## 2025-01-19 ENCOUNTER — Emergency Department (HOSPITAL_BASED_OUTPATIENT_CLINIC_OR_DEPARTMENT_OTHER)

## 2025-01-19 ENCOUNTER — Emergency Department (HOSPITAL_BASED_OUTPATIENT_CLINIC_OR_DEPARTMENT_OTHER)
Admission: EM | Admit: 2025-01-19 | Discharge: 2025-01-19 | Disposition: A | Discharge status: Home / Self Care | Source: Ambulatory Visit | Attending: Emergency Medicine | Admitting: Emergency Medicine

## 2025-01-19 ENCOUNTER — Other Ambulatory Visit: Payer: Self-pay

## 2025-01-19 ENCOUNTER — Encounter: Payer: Self-pay | Admitting: *Deleted

## 2025-01-19 ENCOUNTER — Ambulatory Visit: Admission: EM | Admit: 2025-01-19 | Discharge: 2025-01-19 | Disposition: A | Discharge status: Home / Self Care

## 2025-01-19 ENCOUNTER — Encounter (HOSPITAL_BASED_OUTPATIENT_CLINIC_OR_DEPARTMENT_OTHER): Payer: Self-pay | Admitting: Emergency Medicine

## 2025-01-19 DIAGNOSIS — Y9301 Activity, walking, marching and hiking: Secondary | ICD-10-CM | POA: Insufficient documentation

## 2025-01-19 DIAGNOSIS — Z23 Encounter for immunization: Secondary | ICD-10-CM | POA: Insufficient documentation

## 2025-01-19 DIAGNOSIS — S80212A Abrasion, left knee, initial encounter: Secondary | ICD-10-CM | POA: Insufficient documentation

## 2025-01-19 DIAGNOSIS — Z79899 Other long term (current) drug therapy: Secondary | ICD-10-CM | POA: Insufficient documentation

## 2025-01-19 DIAGNOSIS — E119 Type 2 diabetes mellitus without complications: Secondary | ICD-10-CM | POA: Diagnosis not present

## 2025-01-19 DIAGNOSIS — S0083XA Contusion of other part of head, initial encounter: Secondary | ICD-10-CM | POA: Diagnosis not present

## 2025-01-19 DIAGNOSIS — I10 Essential (primary) hypertension: Secondary | ICD-10-CM | POA: Diagnosis not present

## 2025-01-19 DIAGNOSIS — S0012XA Contusion of left eyelid and periocular area, initial encounter: Secondary | ICD-10-CM | POA: Diagnosis not present

## 2025-01-19 DIAGNOSIS — S0081XA Abrasion of other part of head, initial encounter: Secondary | ICD-10-CM

## 2025-01-19 DIAGNOSIS — W01198A Fall on same level from slipping, tripping and stumbling with subsequent striking against other object, initial encounter: Secondary | ICD-10-CM | POA: Diagnosis not present

## 2025-01-19 DIAGNOSIS — W19XXXA Unspecified fall, initial encounter: Secondary | ICD-10-CM

## 2025-01-19 DIAGNOSIS — Y92512 Supermarket, store or market as the place of occurrence of the external cause: Secondary | ICD-10-CM | POA: Diagnosis not present

## 2025-01-19 DIAGNOSIS — Z794 Long term (current) use of insulin: Secondary | ICD-10-CM | POA: Diagnosis not present

## 2025-01-19 DIAGNOSIS — Z7982 Long term (current) use of aspirin: Secondary | ICD-10-CM | POA: Diagnosis not present

## 2025-01-19 DIAGNOSIS — S8002XA Contusion of left knee, initial encounter: Secondary | ICD-10-CM

## 2025-01-19 DIAGNOSIS — S0990XA Unspecified injury of head, initial encounter: Secondary | ICD-10-CM | POA: Diagnosis present

## 2025-01-19 MED ORDER — ACETAMINOPHEN 500 MG PO TABS
1000.0000 mg | ORAL_TABLET | Freq: Once | ORAL | Status: AC
  Administered 2025-01-19: 1000 mg via ORAL
  Filled 2025-01-19: qty 2

## 2025-01-19 MED ORDER — LIDOCAINE-EPINEPHRINE (PF) 2 %-1:200000 IJ SOLN
10.0000 mL | Freq: Once | INTRAMUSCULAR | Status: AC
  Administered 2025-01-19: 10 mL
  Filled 2025-01-19: qty 20

## 2025-01-19 MED ORDER — TETANUS-DIPHTH-ACELL PERTUSSIS 5-2-15.5 LF-MCG/0.5 IM SUSP
0.5000 mL | Freq: Once | INTRAMUSCULAR | Status: AC
  Administered 2025-01-19: 0.5 mL via INTRAMUSCULAR
  Filled 2025-01-19: qty 0.5

## 2025-01-19 NOTE — ED Triage Notes (Signed)
 Fall slipped on ice around 1500 Hematoma on left side above eye Lac above left eye Left knee abrasion and pain

## 2025-01-19 NOTE — ED Notes (Signed)
 Patient is being discharged from the Urgent Care and sent to the Emergency Department via POV . Per Therisa Ford, NP, patient is in need of higher level of care due to fall with head injury. Patient is aware and verbalizes understanding of plan of care.  Vitals:   01/19/25 1800  BP: 112/71  Pulse: 87  Resp: 18  Temp: 98.2 F (36.8 C)  SpO2: 97%

## 2025-01-19 NOTE — Discharge Instructions (Addendum)
 You have swelling around your left eye.  Please continue to apply ice to the area to up with pain and swelling.   You may use up to 600mg  ibuprofen every 6 hours as needed for pain.  This will also help with your eye swelling.  Do not exceed 2.4g of ibuprofen per day.  You may take up to 1000mg  of tylenol  every 6 hours as needed for pain.  Do not take more then 4g per day.  The CT scan of your head and neck was otherwise reassuring.  No signs of skull fracture, brain bleed, or other acute injury.   The x-ray of your left knee did not show any fracture or dislocation.  Please continue to keep your abrasions clean with soap and water at home.  Apply bacitracin or Neosporin ointment as shown here and keep covered with a bandage until fully healed.  Your tetanus was updated today.  Please return to the ER for any severe headaches not controlled with Tylenol  or ibuprofen, any changes in your vision, persistent vomiting, any other new or concerning symptoms

## 2025-01-19 NOTE — ED Triage Notes (Addendum)
 Pt reports she slipped on ice in parking lot at [place] and took a dive and landed on left side of her body and face. Denies LOC. Large hematoma present over left eye. Abrasions to left knee. Full ROM to left arm. Pt able to walk unassisted to triage room

## 2025-01-19 NOTE — ED Provider Notes (Signed)
 " [place] EMERGENCY DEPARTMENT AT [facility] Provider Note   CSN: [id number] Arrival date & time: 01/19/25  1846     Patient presents with: Jasmine Davila is a 74 y.o. female with history of type 2 diabetes, hypertension, presents with concern for a mechanical fall that occurred earlier today.  Reports that she slipped on black ice while walking into [employer] earlier today.  This caused her to fall forward and she hit the front of her head.  Denies any loss of consciousness.  She is not on any anticoagulation.  She reports that she also hit her left knee and has having some pain in her left knee.  Denies any severe headaches, changes in vision, nausea or vomiting.  Denies any paresthesias in the upper or lower extremities bilaterally.  Reports that she has been able to ambulate without difficulty after this fall.  She is uncertain when her last tetanus was.  {Add pertinent medical, surgical, social history, OB history to YEP:[id number]}  Fall       Prior to Admission medications  Medication Sig Start Date End Date Taking? Authorizing Provider  ALPRAZolam  (XANAX ) 0.25 MG tablet Take 1 tablet (0.25 mg total) by mouth 3 (three) times daily as needed for anxiety. 10/06/24   Cornelius Wanda DEL, MD  anastrozole  (ARIMIDEX ) 1 MG tablet TAKE 1 TABLET BY MOUTH DAILY. 05/04/24   Harl Setter A, NP  aspirin  EC 81 MG tablet Take 1 tablet (81 mg total) by mouth 2 (two) times daily. To be taken after surgery to prevent blood clots Patient taking differently: Take 81 mg by mouth daily. Takes 1 tab daily 04/20/22   Jule Ronal CROME, PA-C  carboxymethylcellulose (REFRESH TEARS) 0.5 % SOLN Place 1 drop into both eyes 3 (three) times daily as needed (dry eyes).    [provider]  cetirizine (ALLERGY, CETIRIZINE,) 10 MG tablet Take 10 mg by mouth daily.    [provider]  Continuous Glucose Receiver (DEXCOM G6 RECEIVER) DEVI Use to check blood sugars every morning  fasting and 2 hours after largest meal 11/05/23   Chandra Toribio POUR, MD  Continuous Glucose Sensor (DEXCOM G7 SENSOR) MISC Apply to back of arm every 10 days 10/10/24   Chandra Toribio POUR, MD  Continuous Glucose Transmitter (DEXCOM G6 TRANSMITTER) MISC Use to check blood sugars every morning fasting and 2 hours after largest meal 09/06/24   Chandra Toribio POUR, MD  famotidine  (PEPCID ) 20 MG tablet TAKE 1 TABLET BY MOUTH TWICE  DAILY 10/02/24   Chandra Toribio POUR, MD  fluticasone  (FLONASE ) 50 MCG/ACT nasal spray Place 1 spray into both nostrils daily as needed for allergies or rhinitis. 10/04/24   Chandra Toribio POUR, MD  folic acid  (FOLVITE ) 400 MCG tablet Take 400 mcg by mouth daily.    [provider]  gabapentin  (NEURONTIN ) 600 MG tablet TAKE 2 TABLETS BY MOUTH 3 TIMES  DAILY 05/18/24   Chandra Toribio POUR, MD  HYDROcodone  bit-homatropine (HYDROMET) 5-1.5 MG/5ML syrup Take 5 mLs by mouth every 6 (six) hours as needed. Patient not taking: Reported on 01/19/2025 11/22/24   Nicholaus Credit, PA-C  insulin  lispro (HUMALOG  KWIKPEN) 100 UNIT/ML KwikPen Inject 20 Units into the skin 3 (three) times daily. INJECT 20 UNITS 3 TIMES A DAY WITH MEALS 07/28/24   Chandra Toribio POUR, MD  LANTUS  SOLOSTAR 100 UNIT/ML Solostar Pen INJECT 70 UNITS INTO THE SKIN DAILY. NEED OFFICE VISIT FOR FURTHER REFILLS 11/24/23   Chandra Toribio POUR, MD  losartan  (COZAAR ) 25 MG tablet TAKE 1 TABLET BY MOUTH DAILY 08/10/24   Olson, Daniel K, MD  Multiple Vitamin (MULTIVITAMIN) tablet Take 1 tablet by mouth daily.    [provider]  NIACIN  PO Take 1,000 mg by mouth at bedtime. Take 1 hour after the 81 mg aspirin     [provider]  PIP PEN NEEDLES 31G X 31G X 5 MM MISC USE AS DIRECTED WITH INSULIN  PENS (4 PER DAY) 10/04/24   Chandra Toribio POUR, MD    Allergies: Doxycycline, Hydromorphone , Metronidazole, Other, and Dilaudid  [hydromorphone  hcl]    Review of Systems  Updated Vital Signs BP (!) 129/109 (BP Location: Right Arm)   Pulse 90    Temp 98 F (36.7 C)   Resp 18   LMP 12/21/1998   SpO2 99%   Physical Exam       (all labs ordered are listed, but only abnormal results are displayed) Labs Reviewed - No data to display  EKG: None  Radiology: CT Maxillofacial Wo Contrast Result Date: 01/19/2025 CLINICAL DATA:  Initial evaluation for acute left facial trauma. EXAM: CT MAXILLOFACIAL WITHOUT CONTRAST TECHNIQUE: Multidetector CT imaging of the maxillofacial structures was performed. Multiplanar CT image reconstructions were also generated. RADIATION DOSE REDUCTION: This exam was performed according to the departmental dose-optimization program which includes automated exposure control, adjustment of the mA and/or kV according to patient size and/or use of iterative reconstruction technique. COMPARISON:  None Available. FINDINGS: Osseous: Zygomatic arches intact. No acute maxillary fracture. Pterygoid plates intact. No nasal bone fracture. Nasal septum intact. No acute mandibular fracture. Mandibular condyles normally situated. No acute abnormality about the dentition. Orbits: Prior bilateral ocular lens replacement. Globes and orbital soft tissues demonstrate no acute finding. Bony orbits intact. Sinuses: Paranasal sinuses are clear. Soft tissues: Soft tissue contusion/hematoma present at the left supraorbital/periorbital soft tissues. Limited intracranial: Negative. IMPRESSION: 1. Soft tissue contusion/hematoma at the left supraorbital/periorbital soft tissues. 2. No acute maxillofacial fracture. Electronically Signed   By: Morene Hoard M.D.   On: 01/19/2025 20:49   CT Cervical Spine Wo Contrast Result Date: 01/19/2025 CLINICAL DATA:  Initial evaluation for acute blunt facial trauma. EXAM: CT CERVICAL SPINE WITHOUT CONTRAST TECHNIQUE: Multidetector CT imaging of the cervical spine was performed without intravenous contrast. Multiplanar CT image reconstructions were also generated. RADIATION DOSE REDUCTION: This exam  was performed according to the departmental dose-optimization program which includes automated exposure control, adjustment of the mA and/or kV according to patient size and/or use of iterative reconstruction technique. COMPARISON:  None Available. FINDINGS: Alignment: Straightening with slight reversal of the normal cervical lordosis. No significant listhesis. Skull base and vertebrae: Skull base intact. Normal C1-2 articulations are preserved and the dens is intact. Vertebral body heights maintained. No acute fracture. Soft tissues and spinal canal: Soft tissues of the neck demonstrate no acute finding. No abnormal prevertebral edema. Spinal canal within normal limits. Vascular calcifications about the carotid bifurcations. Disc levels: Degenerative osteoarthritic changes about the C1-2 articulation. Moderate spondylosis at C5-6 and C6-7. Upper chest: Not included on this exam. Other: None. IMPRESSION: 1. No acute traumatic injury within the cervical spine. 2. Moderate spondylosis at C5-6 and C6-7. Electronically Signed   By: Morene Hoard M.D.   On: 01/19/2025 20:47   DG Knee Complete 4 Views Left Result Date: 01/19/2025 CLINICAL DATA:  Initial evaluation for acute trauma, fall. EXAM: LEFT KNEE - COMPLETE 4+ VIEW COMPARISON:  None Available. FINDINGS: No acute fracture dislocation. No joint effusion. Mild-to-moderate osteoarthritic changes  about the knee. No visible soft tissue injury. Osteopenia. IMPRESSION: 1. No acute osseous abnormality about the left knee. 2. Mild-to-moderate osteoarthritic changes about the knee. Electronically Signed   By: Morene Hoard M.D.   On: 01/19/2025 20:41   CT Head Wo Contrast Result Date: 01/19/2025 CLINICAL DATA:  Initial evaluation for acute blunt facial trauma. EXAM: CT HEAD WITHOUT CONTRAST TECHNIQUE: Contiguous axial images were obtained from the base of the skull through the vertex without intravenous contrast. RADIATION DOSE REDUCTION: This exam was  performed according to the departmental dose-optimization program which includes automated exposure control, adjustment of the mA and/or kV according to patient size and/or use of iterative reconstruction technique. COMPARISON:  None Available. FINDINGS: Brain: No acute intracranial hemorrhage. No acute large vessel territory infarct. No mass lesion or midline shift. No hydrocephalus or extra-axial fluid collection. Vascular: No abnormal hyperdense vessel. Calcified atherosclerosis present at the skull base. Skull: Soft tissue contusion/hematoma present at the left supraorbital/periorbital soft tissues. Calvarium intact without fracture. Sinuses/Orbits: Globes and orbital soft tissues demonstrate no other acute finding. Paranasal sinuses and mastoid air cells are largely clear. Other: None. IMPRESSION: 1. No acute intracranial abnormality. 2. Soft tissue contusion/hematoma at the left supraorbital/periorbital soft tissues. No calvarial fracture. Electronically Signed   By: Morene Hoard M.D.   On: 01/19/2025 20:40    {Document cardiac monitor, telemetry assessment procedure when appropriate:32947} Procedures   Medications Ordered in the ED - No data to display    {Click here for ABCD2, HEART and other calculators REFRESH Note before signing:1}                              Medical Decision Making Amount and/or Complexity of Data Reviewed Radiology: ordered.  Risk OTC drugs. Prescription drug management.   ***  {Document critical care time when appropriate  Document review of labs and clinical decision tools ie CHADS2VASC2, etc  Document your independent review of radiology images and any outside records  Document your discussion with family members, caretakers and with consultants  Document social determinants of health affecting pt's care  Document your decision making why or why not admission, treatments were needed:32947:::1}   Final diagnoses:  None    ED Discharge Orders      None        "

## 2025-01-19 NOTE — Discharge Instructions (Addendum)
 Because of your age and extent of your facial injuries, a higher level evaluation is recommended with consideration for advanced imaging.   Please proceed to the Emergency Department of your choice.

## 2025-01-19 NOTE — ED Provider Notes (Signed)
 " [facility]    CSN: [id number] Arrival date & time: 01/19/25  1654      History   Chief Complaint Chief Complaint  Patient presents with   Fall    HPI Jasmine Davila is a 74 y.o. female.   Patient presents for evaluation of facial injuries and a left knee contusion that occurred from a mechanical trip and fall earlier today around 1500.  Patient states she was walking through a parking lot and hit a patch of black ice-she fell forward and impacted the left side of her face and her left knee.  She tried to guard her head from the fall with her left hand-however, she took a large impact to the area around her left eye anyway.  Denied any loss of consciousness or headache/dizziness following the fall.  No acute neck pain or back pain.  She does have abrasions and swelling where her left knee impacted the ground.  She only takes a baby aspirin  daily-no other anticoagulants.  She is ambulatory independently.  No reported chest pain, shortness of breath or abdominal pain.  She has not taken any medications for the symptoms.  This is a triage exam to the emergency department.  The history is provided by the patient.  Fall Pertinent negatives include no chest pain, no abdominal pain, no headaches and no shortness of breath.    Past Medical History:  Diagnosis Date   Abnormal EKG    hx of right bundle branch block   Acute pancreatitis    HX OF   Allergy 2008   Not really sure   Anemia yrs ago   Anxiety    Arthritis 2010   Breast cancer (HCC) 2021   Cancer (HCC) dx 2016   B cell lymphoma- Non Hodgkins in remission   Diabetes mellitus (HCC)    TYPE 2   Diabetic neuropathy (HCC)    Diabetic neuropathy (HCC)    Endometrial disorder    GERD (gastroesophageal reflux disease)    Headache    tension or sinus   Hypertension    Malignant lymphoma (HCC)    Morbid obesity (HCC)    Peripheral neuropathy    BOTH FEET, SLIGHT NEUROPATHY IN HANDS   Sleep apnea     USES CPAP at times   Vitamin B 12 deficiency     Patient Active Problem List   Diagnosis Date Noted   Healthcare maintenance 10/09/2024   Bacterial sinusitis 01/03/2024   Breast cancer, right breast (HCC) 06/11/2023    Class: Acute   Arthritis 06/09/2023   Status post total right knee replacement 06/08/2022   Primary osteoarthritis of right knee 06/07/2022   Ductal carcinoma in situ (DCIS) of right breast 06/14/2020   Abnormal LFTs 06/21/2019   Vitamin D  insufficiency 04/11/2019   Lumbar spondylosis 02/28/2019   Chronic back pain 02/07/2019   Acrochordon- over R eye interferring with vision 02/07/2019   History of malignant lymphoma 06/03/2018   Endometrial disorder- trace endometrial canal fluid seen on CT scan recently and in 2016.  No GYN care many yrs, to GYN for TVUS and further w/up prn 04/20/2018   Sleep apnea with CPAP 12/29/2016   History of decreased platelet count 12/29/2016   Morbid obesity (HCC) 11/25/2016   Diabetes mellitus due to underlying condition with diabetic nephropathy, with long-term current use of insulin  (HCC) 11/25/2016   Hypertension associated with diabetes (HCC) 11/24/2016   Chronic pancreatitis (HCC) 11/24/2016   H/o Cancer:   B- cell  lymphoma (spleen and lung) - non-Hodgkin's 11/24/2016   GERD (gastroesophageal reflux disease) 11/24/2016   Hyperlipidemia associated with type 2 diabetes mellitus (HCC) 11/24/2016   B12 deficiency 11/24/2016   Vitamin B6 deficiency (non anemic) 11/24/2016   Diabetic peripheral neuropathy associated with type 2 diabetes mellitus (HCC) 11/24/2016    Past Surgical History:  Procedure Laterality Date   BREAST LUMPECTOMY Right 2021   CHOLECYSTECTOMY  03/06/1996   colomscopy     DILATATION & CURETTAGE/HYSTEROSCOPY WITH MYOSURE N/A 07/04/2018   Procedure: DILATATION & CURETTAGE/HYSTEROSCOPY WITH MYOSURE;  Surgeon: Jannis Kate Norris, MD;  Location: [facility];  Service: Gynecology;  Laterality: N/A;    EYE SURGERY  04/2024   Cataract surgery   MEDIPORT INSERTION, DOUBLE     MEDIPORT REMOVAL     TOTAL KNEE ARTHROPLASTY Right 06/08/2022   Procedure: RIGHT TOTAL KNEE REPLACEMENT;  Surgeon: Jerri Kay HERO, MD;  Location: [facility] OR;  Service: Orthopedics;  Laterality: Right;    OB History     Gravida  2   Para  2   Term  2   Preterm      AB      Living  1      SAB      IAB      Ectopic      Multiple      Live Births  2            Home Medications    Prior to Admission medications  Medication Sig Start Date End Date Taking? Authorizing Provider  ALPRAZolam  (XANAX ) 0.25 MG tablet Take 1 tablet (0.25 mg total) by mouth 3 (three) times daily as needed for anxiety. 10/06/24  Yes Cornelius Wanda DEL, MD  anastrozole  (ARIMIDEX ) 1 MG tablet TAKE 1 TABLET BY MOUTH DAILY. 05/04/24  Yes Harl Setter A, NP  aspirin  EC 81 MG tablet Take 1 tablet (81 mg total) by mouth 2 (two) times daily. To be taken after surgery to prevent blood clots Patient taking differently: Take 81 mg by mouth daily. Takes 1 tab daily 04/20/22  Yes Jule Ronal CROME, PA-C  carboxymethylcellulose (REFRESH TEARS) 0.5 % SOLN Place 1 drop into both eyes 3 (three) times daily as needed (dry eyes).   Yes [provider]  cetirizine (ALLERGY, CETIRIZINE,) 10 MG tablet Take 10 mg by mouth daily.   Yes [provider]  famotidine  (PEPCID ) 20 MG tablet TAKE 1 TABLET BY MOUTH TWICE  DAILY 10/02/24  Yes Chandra Toribio POUR, MD  fluticasone  (FLONASE ) 50 MCG/ACT nasal spray Place 1 spray into both nostrils daily as needed for allergies or rhinitis. 10/04/24  Yes Chandra Toribio POUR, MD  folic acid  (FOLVITE ) 400 MCG tablet Take 400 mcg by mouth daily.   Yes [provider]  gabapentin  (NEURONTIN ) 600 MG tablet TAKE 2 TABLETS BY MOUTH 3 TIMES  DAILY 05/18/24  Yes Chandra Toribio POUR, MD  insulin  lispro (HUMALOG  KWIKPEN) 100 UNIT/ML KwikPen Inject 20 Units into the skin 3 (three) times daily. INJECT 20 UNITS 3  TIMES A DAY WITH MEALS 07/28/24  Yes Chandra Toribio POUR, MD  LANTUS  SOLOSTAR 100 UNIT/ML Solostar Pen INJECT 70 UNITS INTO THE SKIN DAILY. NEED OFFICE VISIT FOR FURTHER REFILLS 11/24/23  Yes Chandra Toribio POUR, MD  losartan  (COZAAR ) 25 MG tablet TAKE 1 TABLET BY MOUTH DAILY 08/10/24  Yes Olson, Daniel K, MD  Multiple Vitamin (MULTIVITAMIN) tablet Take 1 tablet by mouth daily.   Yes [provider]  NIACIN  PO Take  1,000 mg by mouth at bedtime. Take 1 hour after the 81 mg aspirin    Yes [provider]  Continuous Glucose Receiver (DEXCOM G6 RECEIVER) DEVI Use to check blood sugars every morning fasting and 2 hours after largest meal 11/05/23   Chandra Toribio POUR, MD  Continuous Glucose Sensor (DEXCOM G7 SENSOR) MISC Apply to back of arm every 10 days 10/10/24   Chandra Toribio POUR, MD  Continuous Glucose Transmitter (DEXCOM G6 TRANSMITTER) MISC Use to check blood sugars every morning fasting and 2 hours after largest meal 09/06/24   Chandra Toribio POUR, MD  HYDROcodone  bit-homatropine (HYDROMET) 5-1.5 MG/5ML syrup Take 5 mLs by mouth every 6 (six) hours as needed. Patient not taking: Reported on 01/19/2025 11/22/24   Nicholaus Credit, PA-C  PIP PEN NEEDLES 31G X 31G X 5 MM MISC USE AS DIRECTED WITH INSULIN  PENS (4 PER DAY) 10/04/24   Chandra Toribio POUR, MD    Family History Family History  Problem Relation Age of Onset   Colon cancer Mother 31   Hypertension Mother    Osteoporosis Mother    Arthritis Mother    Cancer Mother    Heart attack Father    Heart disease Sister    Alcohol abuse Maternal Grandfather    Breast cancer Other        MGM's sister; dx 42s   Learning disabilities Son     Social History Social History[1]   Allergies   Doxycycline, Hydromorphone , Metronidazole, Other, and Dilaudid  [hydromorphone  hcl]   Review of Systems Review of Systems  HENT:  Positive for facial swelling.        Positive for left-sided facial/periorbital trauma.  Respiratory:  Negative for cough and  shortness of breath.   Cardiovascular:  Negative for chest pain.  Gastrointestinal:  Negative for abdominal pain.  Musculoskeletal:  Positive for arthralgias and myalgias. Negative for gait problem, neck pain and neck stiffness.  Skin:  Positive for wound.       Abrasions to left knee.  Neurological:  Negative for dizziness and headaches.  Psychiatric/Behavioral:  The patient is not nervous/anxious.      Physical Exam Triage Vital Signs ED Triage Vitals  Encounter Vitals Group     BP 01/19/25 1800 112/71     Girls Systolic BP Percentile --      Girls Diastolic BP Percentile --      Boys Systolic BP Percentile --      Boys Diastolic BP Percentile --      Pulse Rate 01/19/25 1800 87     Resp 01/19/25 1800 18     Temp 01/19/25 1800 98.2 F (36.8 C)     Temp Source 01/19/25 1800 Oral     SpO2 01/19/25 1800 97 %     Weight --      Height --      Head Circumference --      Peak Flow --      Pain Score 01/19/25 1756 6     Pain Loc --      Pain Education --      Exclude from Growth Chart --    No data found.  Updated Vital Signs BP 112/71 (BP Location: Left Arm)   Pulse 87   Temp 98.2 F (36.8 C) (Oral)   Resp 18   LMP 12/21/1998   SpO2 97%   Visual Acuity Right Eye Distance:   Left Eye Distance:   Bilateral Distance:    Right Eye Near:  Left Eye Near:    Bilateral Near:     Physical Exam Vitals and nursing note reviewed.  HENT:     Head:     Comments: Head injury as documented in the photograph below.    Right Ear: Tympanic membrane and ear canal normal.     Left Ear: Tympanic membrane and ear canal normal.     Ears:     Comments: No hemotympanums noted bilaterally.    Nose: No congestion.     Mouth/Throat:     Pharynx: No posterior oropharyngeal erythema.     Comments: No dental or intraoral trauma appreciated. Eyes:     Extraocular Movements: Extraocular movements intact.     Conjunctiva/sclera: Conjunctivae normal.     Pupils: Pupils are equal,  round, and reactive to light.  Neck:     Comments: No cervical bony or bilateral paraspinal muscle tenderness to palpation or range of motion. Cardiovascular:     Rate and Rhythm: Normal rate and regular rhythm.     Heart sounds: No murmur heard. Pulmonary:     Breath sounds: Normal breath sounds. No wheezing, rhonchi or rales.  Abdominal:     General: Bowel sounds are normal.  Musculoskeletal:        General: Tenderness and signs of injury present.     Cervical back: Normal range of motion and neck supple. No tenderness.     Comments: See photo documentation.  Skin:    Findings: Bruising present.     Comments: Ecchymosis and abrasions noted to the left knee.  See photo documentation.  Neurological:     General: No focal deficit present.     Mental Status: She is alert and oriented to person, place, and time.     Gait: Gait normal.  Psychiatric:        Mood and Affect: Mood normal.        Behavior: Behavior normal.        Thought Content: Thought content normal.        Judgment: Judgment normal.       UC Treatments / Results  Labs (all labs ordered are listed, but only abnormal results are displayed) Labs Reviewed - No data to display  EKG   Radiology No results found.  Procedures Procedures (including critical care time)  Medications Ordered in UC Medications - No data to display  Initial Impression / Assessment and Plan / UC Course  I have reviewed the triage vital signs and the nursing notes.  Pertinent labs & imaging results that were available during my care of the patient were reviewed by me and considered in my medical decision making (see chart for details).    Patient presents for evaluation sustained during a mechanical trip and fall prior to arrival.  There is no reported loss of consciousness, subsequent headache or dizziness.  She has notable ecchymosis, swelling, and abrasions to the left side of her face and periorbital region.  She also has  swelling, ecchymosis, and abrasions to her left knee.  Based upon her age, mechanism of injury-she would benefit from a higher level evaluation with consideration for advanced imaging which is unfortunately unavailable in urgent care.  Through shared decision making, she is agreeable to present at the emergency department for further evaluation and is stable for transport via private vehicle.  She is neurologically intact, without cervical spinal discomfort, and ambulatory independently.  Final Clinical Impressions(s) / UC Diagnoses   Final diagnoses:  Fall, initial encounter  Injury  of head, initial encounter  Contusion of left knee, initial encounter     Discharge Instructions      Because of your age and extent of your facial injuries, a higher level evaluation is recommended with consideration for advanced imaging.   Please proceed to the Emergency Department of your choice.    ED Prescriptions   None    PDMP not reviewed this encounter.    [1]  Social History Tobacco Use   Smoking status: Former    Current packs/day: 0.00    Average packs/day: 0.5 packs/day for 4.0 years (2.0 ttl pk-yrs)    Types: Cigarettes    Start date: 12/21/1964    Quit date: 12/21/1968    Years since quitting: 56.1   Smokeless tobacco: Never   Tobacco comments:    as teenager  Vaping Use   Vaping status: Never Used  Substance Use Topics   Alcohol use: No   Drug use: No     Janet Therisa PARAS, FNP 01/19/25 1822  "

## 2025-02-09 ENCOUNTER — Inpatient Hospital Stay: Admitting: Oncology

## 2025-02-12 ENCOUNTER — Encounter: Admitting: Physical Medicine & Rehabilitation

## 2025-04-19 ENCOUNTER — Ambulatory Visit: Admitting: Family Medicine

## 2025-09-20 ENCOUNTER — Ambulatory Visit
